# Patient Record
Sex: Female | Born: 1935 | Hispanic: No | Marital: Married | Smoking: Never smoker
Health system: Southern US, Community
[De-identification: ages and names within clinical notes are randomized; demographics above are authoritative.]

## PROBLEM LIST (undated history)

## (undated) DIAGNOSIS — Z7901 Long term (current) use of anticoagulants: Secondary | ICD-10-CM

## (undated) DIAGNOSIS — N186 End stage renal disease: Secondary | ICD-10-CM

## (undated) DIAGNOSIS — I214 Non-ST elevation (NSTEMI) myocardial infarction: Secondary | ICD-10-CM

## (undated) DIAGNOSIS — K8020 Calculus of gallbladder without cholecystitis without obstruction: Secondary | ICD-10-CM

## (undated) DIAGNOSIS — E0590 Thyrotoxicosis, unspecified without thyrotoxic crisis or storm: Secondary | ICD-10-CM

## (undated) DIAGNOSIS — E785 Hyperlipidemia, unspecified: Secondary | ICD-10-CM

## (undated) DIAGNOSIS — D649 Anemia, unspecified: Secondary | ICD-10-CM

## (undated) DIAGNOSIS — I10 Essential (primary) hypertension: Secondary | ICD-10-CM

## (undated) DIAGNOSIS — I480 Paroxysmal atrial fibrillation: Secondary | ICD-10-CM

## (undated) DIAGNOSIS — F419 Anxiety disorder, unspecified: Secondary | ICD-10-CM

## (undated) DIAGNOSIS — I4892 Unspecified atrial flutter: Principal | ICD-10-CM

## (undated) HISTORY — PX: BREAST LUMPECTOMY: SHX2

## (undated) HISTORY — PX: GALLBLADDER SURGERY: SHX652

## (undated) HISTORY — DX: Thyrotoxicosis, unspecified without thyrotoxic crisis or storm: E05.90

## (undated) HISTORY — DX: Non-ST elevation (NSTEMI) myocardial infarction: I21.4

## (undated) HISTORY — DX: End stage renal disease: N18.6

## (undated) HISTORY — DX: Anemia, unspecified: D64.9

## (undated) HISTORY — DX: Calculus of gallbladder without cholecystitis without obstruction: K80.20

## (undated) HISTORY — DX: Hyperlipidemia, unspecified: E78.5

## (undated) HISTORY — DX: Essential (primary) hypertension: I10

## (undated) HISTORY — DX: Paroxysmal atrial fibrillation: I48.0

## (undated) HISTORY — DX: Anxiety disorder, unspecified: F41.9

---

## 1998-05-03 ENCOUNTER — Encounter: Admission: RE | Admit: 1998-05-03 | Discharge: 1998-05-03 | Payer: Self-pay | Admitting: Internal Medicine

## 1998-05-06 ENCOUNTER — Encounter: Admission: RE | Admit: 1998-05-06 | Discharge: 1998-05-06 | Payer: Self-pay | Admitting: Internal Medicine

## 1998-05-27 ENCOUNTER — Ambulatory Visit (HOSPITAL_COMMUNITY): Admission: RE | Admit: 1998-05-27 | Discharge: 1998-05-27 | Payer: Self-pay | Admitting: *Deleted

## 1998-06-08 ENCOUNTER — Encounter: Admission: RE | Admit: 1998-06-08 | Discharge: 1998-06-08 | Payer: Self-pay | Admitting: Obstetrics & Gynecology

## 1999-06-02 ENCOUNTER — Encounter: Payer: Self-pay | Admitting: Internal Medicine

## 1999-06-02 ENCOUNTER — Ambulatory Visit (HOSPITAL_COMMUNITY): Admission: RE | Admit: 1999-06-02 | Discharge: 1999-06-02 | Payer: Self-pay | Admitting: Internal Medicine

## 1999-06-16 ENCOUNTER — Encounter: Admission: RE | Admit: 1999-06-16 | Discharge: 1999-06-16 | Payer: Self-pay | Admitting: Internal Medicine

## 1999-07-26 ENCOUNTER — Encounter: Admission: RE | Admit: 1999-07-26 | Discharge: 1999-07-26 | Payer: Self-pay | Admitting: Internal Medicine

## 1999-07-27 ENCOUNTER — Encounter: Admission: RE | Admit: 1999-07-27 | Discharge: 1999-07-27 | Payer: Self-pay | Admitting: Internal Medicine

## 1999-08-09 ENCOUNTER — Encounter: Admission: RE | Admit: 1999-08-09 | Discharge: 1999-08-09 | Payer: Self-pay | Admitting: Internal Medicine

## 1999-08-17 ENCOUNTER — Encounter: Admission: RE | Admit: 1999-08-17 | Discharge: 1999-08-17 | Payer: Self-pay | Admitting: Internal Medicine

## 1999-10-12 ENCOUNTER — Encounter: Admission: RE | Admit: 1999-10-12 | Discharge: 1999-10-12 | Payer: Self-pay | Admitting: Internal Medicine

## 1999-12-02 ENCOUNTER — Other Ambulatory Visit: Admission: RE | Admit: 1999-12-02 | Discharge: 1999-12-02 | Payer: Self-pay | Admitting: *Deleted

## 1999-12-02 ENCOUNTER — Encounter: Admission: RE | Admit: 1999-12-02 | Discharge: 1999-12-02 | Payer: Self-pay | Admitting: Obstetrics & Gynecology

## 2000-04-09 ENCOUNTER — Encounter: Admission: RE | Admit: 2000-04-09 | Discharge: 2000-04-09 | Payer: Self-pay | Admitting: Internal Medicine

## 2000-04-13 ENCOUNTER — Ambulatory Visit (HOSPITAL_COMMUNITY): Admission: RE | Admit: 2000-04-13 | Discharge: 2000-04-13 | Payer: Self-pay | Admitting: Internal Medicine

## 2000-05-15 ENCOUNTER — Encounter: Admission: RE | Admit: 2000-05-15 | Discharge: 2000-05-15 | Payer: Self-pay | Admitting: Internal Medicine

## 2000-11-20 ENCOUNTER — Encounter: Admission: RE | Admit: 2000-11-20 | Discharge: 2000-11-20 | Payer: Self-pay | Admitting: Obstetrics & Gynecology

## 2000-11-27 ENCOUNTER — Encounter: Admission: RE | Admit: 2000-11-27 | Discharge: 2000-11-27 | Payer: Self-pay | Admitting: Obstetrics & Gynecology

## 2000-12-06 ENCOUNTER — Encounter: Admission: RE | Admit: 2000-12-06 | Discharge: 2000-12-06 | Payer: Self-pay | Admitting: Obstetrics

## 2000-12-06 ENCOUNTER — Encounter: Payer: Self-pay | Admitting: Obstetrics

## 2000-12-24 ENCOUNTER — Ambulatory Visit (HOSPITAL_COMMUNITY): Admission: RE | Admit: 2000-12-24 | Discharge: 2000-12-24 | Payer: Self-pay | Admitting: Obstetrics & Gynecology

## 2001-04-17 ENCOUNTER — Encounter: Admission: RE | Admit: 2001-04-17 | Discharge: 2001-04-17 | Payer: Self-pay | Admitting: Internal Medicine

## 2001-11-01 ENCOUNTER — Encounter: Admission: RE | Admit: 2001-11-01 | Discharge: 2001-11-01 | Payer: Self-pay | Admitting: Internal Medicine

## 2001-11-12 ENCOUNTER — Encounter: Admission: RE | Admit: 2001-11-12 | Discharge: 2001-11-12 | Payer: Self-pay | Admitting: Internal Medicine

## 2001-11-12 ENCOUNTER — Ambulatory Visit (HOSPITAL_COMMUNITY): Admission: RE | Admit: 2001-11-12 | Discharge: 2001-11-12 | Payer: Self-pay | Admitting: Internal Medicine

## 2001-11-12 ENCOUNTER — Emergency Department (HOSPITAL_COMMUNITY): Admission: EM | Admit: 2001-11-12 | Discharge: 2001-11-12 | Payer: Self-pay | Admitting: Emergency Medicine

## 2001-11-12 ENCOUNTER — Encounter: Payer: Self-pay | Admitting: Internal Medicine

## 2002-07-29 ENCOUNTER — Encounter: Admission: RE | Admit: 2002-07-29 | Discharge: 2002-07-29 | Payer: Self-pay | Admitting: Internal Medicine

## 2002-07-31 ENCOUNTER — Ambulatory Visit (HOSPITAL_COMMUNITY): Admission: RE | Admit: 2002-07-31 | Discharge: 2002-07-31 | Payer: Self-pay | Admitting: Obstetrics

## 2002-08-15 ENCOUNTER — Encounter: Admission: RE | Admit: 2002-08-15 | Discharge: 2002-08-15 | Payer: Self-pay | Admitting: Internal Medicine

## 2003-06-17 ENCOUNTER — Encounter: Admission: RE | Admit: 2003-06-17 | Discharge: 2003-06-17 | Payer: Self-pay | Admitting: Internal Medicine

## 2003-06-25 ENCOUNTER — Encounter: Admission: RE | Admit: 2003-06-25 | Discharge: 2003-06-25 | Payer: Self-pay | Admitting: Internal Medicine

## 2003-07-07 ENCOUNTER — Encounter: Admission: RE | Admit: 2003-07-07 | Discharge: 2003-07-07 | Payer: Self-pay | Admitting: Internal Medicine

## 2003-07-07 ENCOUNTER — Ambulatory Visit (HOSPITAL_COMMUNITY): Admission: RE | Admit: 2003-07-07 | Discharge: 2003-07-07 | Payer: Self-pay | Admitting: Internal Medicine

## 2003-07-15 ENCOUNTER — Encounter: Admission: RE | Admit: 2003-07-15 | Discharge: 2003-07-15 | Payer: Self-pay | Admitting: Internal Medicine

## 2003-07-29 ENCOUNTER — Ambulatory Visit (HOSPITAL_COMMUNITY): Admission: RE | Admit: 2003-07-29 | Discharge: 2003-07-29 | Payer: Self-pay | Admitting: Internal Medicine

## 2003-07-29 ENCOUNTER — Encounter: Payer: Self-pay | Admitting: Cardiovascular Disease

## 2003-07-29 ENCOUNTER — Encounter: Payer: Self-pay | Admitting: Internal Medicine

## 2003-07-29 ENCOUNTER — Encounter: Admission: RE | Admit: 2003-07-29 | Discharge: 2003-07-29 | Payer: Self-pay | Admitting: Internal Medicine

## 2003-07-31 ENCOUNTER — Encounter: Admission: RE | Admit: 2003-07-31 | Discharge: 2003-07-31 | Payer: Self-pay | Admitting: Internal Medicine

## 2003-09-17 ENCOUNTER — Encounter: Admission: RE | Admit: 2003-09-17 | Discharge: 2003-09-17 | Payer: Self-pay | Admitting: Internal Medicine

## 2004-02-09 ENCOUNTER — Ambulatory Visit (HOSPITAL_COMMUNITY): Admission: RE | Admit: 2004-02-09 | Discharge: 2004-02-09 | Payer: Self-pay

## 2004-02-16 ENCOUNTER — Encounter (INDEPENDENT_AMBULATORY_CARE_PROVIDER_SITE_OTHER): Payer: Self-pay | Admitting: *Deleted

## 2004-02-16 ENCOUNTER — Ambulatory Visit: Payer: Self-pay | Admitting: *Deleted

## 2004-06-15 ENCOUNTER — Ambulatory Visit: Payer: Self-pay | Admitting: Internal Medicine

## 2004-06-20 ENCOUNTER — Ambulatory Visit: Payer: Self-pay | Admitting: Internal Medicine

## 2004-07-11 ENCOUNTER — Ambulatory Visit: Payer: Self-pay | Admitting: Internal Medicine

## 2004-08-24 ENCOUNTER — Ambulatory Visit: Payer: Self-pay | Admitting: Internal Medicine

## 2004-08-29 ENCOUNTER — Ambulatory Visit: Payer: Self-pay | Admitting: Internal Medicine

## 2004-09-12 ENCOUNTER — Ambulatory Visit: Payer: Self-pay | Admitting: Internal Medicine

## 2005-06-05 ENCOUNTER — Ambulatory Visit: Payer: Self-pay | Admitting: Internal Medicine

## 2005-06-15 ENCOUNTER — Ambulatory Visit: Payer: Self-pay | Admitting: Hospitalist

## 2005-06-27 ENCOUNTER — Ambulatory Visit: Payer: Self-pay | Admitting: Internal Medicine

## 2005-07-24 ENCOUNTER — Ambulatory Visit: Payer: Self-pay | Admitting: Internal Medicine

## 2005-08-15 ENCOUNTER — Ambulatory Visit: Payer: Self-pay | Admitting: Internal Medicine

## 2005-08-15 ENCOUNTER — Ambulatory Visit (HOSPITAL_COMMUNITY): Admission: RE | Admit: 2005-08-15 | Discharge: 2005-08-15 | Payer: Self-pay | Admitting: Internal Medicine

## 2005-09-11 ENCOUNTER — Ambulatory Visit: Payer: Self-pay | Admitting: Internal Medicine

## 2005-09-25 ENCOUNTER — Ambulatory Visit: Payer: Self-pay | Admitting: Internal Medicine

## 2005-10-17 ENCOUNTER — Ambulatory Visit: Payer: Self-pay | Admitting: Internal Medicine

## 2005-11-08 ENCOUNTER — Ambulatory Visit: Payer: Self-pay | Admitting: Internal Medicine

## 2005-12-26 ENCOUNTER — Ambulatory Visit: Payer: Self-pay | Admitting: Internal Medicine

## 2006-02-14 ENCOUNTER — Ambulatory Visit: Payer: Self-pay | Admitting: Internal Medicine

## 2006-04-18 DIAGNOSIS — Z888 Allergy status to other drugs, medicaments and biological substances status: Secondary | ICD-10-CM | POA: Insufficient documentation

## 2006-04-18 DIAGNOSIS — K8020 Calculus of gallbladder without cholecystitis without obstruction: Secondary | ICD-10-CM | POA: Insufficient documentation

## 2006-04-18 DIAGNOSIS — E785 Hyperlipidemia, unspecified: Secondary | ICD-10-CM

## 2006-04-18 DIAGNOSIS — F411 Generalized anxiety disorder: Secondary | ICD-10-CM | POA: Insufficient documentation

## 2006-04-18 DIAGNOSIS — D638 Anemia in other chronic diseases classified elsewhere: Secondary | ICD-10-CM | POA: Insufficient documentation

## 2006-05-07 ENCOUNTER — Ambulatory Visit: Payer: Self-pay | Admitting: Internal Medicine

## 2006-06-04 ENCOUNTER — Ambulatory Visit: Payer: Self-pay | Admitting: Internal Medicine

## 2006-06-04 DIAGNOSIS — R0789 Other chest pain: Secondary | ICD-10-CM

## 2006-06-05 ENCOUNTER — Ambulatory Visit: Payer: Self-pay | Admitting: Cardiovascular Disease

## 2006-06-21 ENCOUNTER — Ambulatory Visit: Payer: Self-pay

## 2006-06-26 ENCOUNTER — Ambulatory Visit: Payer: Self-pay | Admitting: Cardiovascular Disease

## 2006-06-26 ENCOUNTER — Encounter: Payer: Self-pay | Admitting: Internal Medicine

## 2006-07-30 ENCOUNTER — Ambulatory Visit: Payer: Self-pay | Admitting: Cardiovascular Disease

## 2006-08-13 ENCOUNTER — Ambulatory Visit: Payer: Self-pay | Admitting: Cardiovascular Disease

## 2006-11-15 ENCOUNTER — Ambulatory Visit: Payer: Self-pay | Admitting: Cardiovascular Disease

## 2006-11-15 ENCOUNTER — Encounter: Payer: Self-pay | Admitting: Internal Medicine

## 2007-01-16 ENCOUNTER — Telehealth: Payer: Self-pay | Admitting: Internal Medicine

## 2007-02-04 ENCOUNTER — Ambulatory Visit: Payer: Self-pay | Admitting: Cardiovascular Disease

## 2007-05-09 ENCOUNTER — Ambulatory Visit: Payer: Self-pay | Admitting: Cardiovascular Disease

## 2007-06-05 ENCOUNTER — Emergency Department (HOSPITAL_COMMUNITY): Admission: EM | Admit: 2007-06-05 | Discharge: 2007-06-05 | Payer: Self-pay | Admitting: Emergency Medicine

## 2007-06-20 ENCOUNTER — Encounter: Payer: Self-pay | Admitting: Internal Medicine

## 2007-06-20 ENCOUNTER — Ambulatory Visit (HOSPITAL_COMMUNITY): Admission: RE | Admit: 2007-06-20 | Discharge: 2007-06-20 | Payer: Self-pay | Admitting: Physical Therapy

## 2007-07-09 ENCOUNTER — Ambulatory Visit: Payer: Self-pay | Admitting: Internal Medicine

## 2007-07-16 ENCOUNTER — Ambulatory Visit: Payer: Self-pay | Admitting: Internal Medicine

## 2007-07-17 LAB — CONVERTED CEMR LAB
ALT: 11 units/L (ref 0–35)
AST: 17 units/L (ref 0–37)
Albumin: 3.8 g/dL (ref 3.5–5.2)
Alkaline Phosphatase: 75 units/L (ref 39–117)
BUN: 24 mg/dL — ABNORMAL HIGH (ref 6–23)
CO2: 27 meq/L (ref 19–32)
Calcium: 9.2 mg/dL (ref 8.4–10.5)
Chloride: 106 meq/L (ref 96–112)
Cholesterol: 204 mg/dL — ABNORMAL HIGH (ref 0–200)
Creatinine, Ser: 1.81 mg/dL — ABNORMAL HIGH (ref 0.40–1.20)
Glucose, Bld: 101 mg/dL — ABNORMAL HIGH (ref 70–99)
HDL: 41 mg/dL (ref >39)
LDL Cholesterol: 119 mg/dL — ABNORMAL HIGH (ref 0–99)
Potassium: 4.5 meq/L (ref 3.5–5.3)
Sodium: 143 meq/L (ref 135–145)
Total Bilirubin: 0.3 mg/dL (ref 0.3–1.2)
Total CHOL/HDL Ratio: 5
Total Protein: 6.7 g/dL (ref 6.0–8.3)
Triglycerides: 219 mg/dL — ABNORMAL HIGH (ref <150)
VLDL: 44 mg/dL — ABNORMAL HIGH (ref 0–40)

## 2007-09-19 ENCOUNTER — Ambulatory Visit: Payer: Self-pay | Admitting: Internal Medicine

## 2007-10-29 ENCOUNTER — Ambulatory Visit: Payer: Self-pay | Admitting: Internal Medicine

## 2007-12-06 ENCOUNTER — Ambulatory Visit: Payer: Self-pay | Admitting: Internal Medicine

## 2007-12-09 ENCOUNTER — Telehealth: Payer: Self-pay | Admitting: Internal Medicine

## 2007-12-09 LAB — CONVERTED CEMR LAB
BUN: 30 mg/dL — ABNORMAL HIGH (ref 6–23)
CO2: 25 meq/L (ref 19–32)
Calcium: 9.7 mg/dL (ref 8.4–10.5)
Chloride: 104 meq/L (ref 96–112)
Creatinine, Ser: 2.01 mg/dL — ABNORMAL HIGH (ref 0.40–1.20)
Creatinine, Urine: 29.9 mg/dL
Glucose, Bld: 99 mg/dL (ref 70–99)
Microalb Creat Ratio: 611.6 mg/g — ABNORMAL HIGH (ref 0.0–30.0)
Microalb, Ur: 18.3 mg/dL — ABNORMAL HIGH (ref 0.00–1.89)
Potassium: 4.5 meq/L (ref 3.5–5.3)
Sodium: 141 meq/L (ref 135–145)
TSH: 2.674 microintl units/mL (ref 0.350–4.50)

## 2008-02-04 ENCOUNTER — Telehealth: Payer: Self-pay | Admitting: Internal Medicine

## 2008-02-10 ENCOUNTER — Ambulatory Visit: Payer: Self-pay

## 2008-02-10 ENCOUNTER — Ambulatory Visit: Payer: Self-pay | Admitting: Internal Medicine

## 2008-02-22 DIAGNOSIS — I4891 Unspecified atrial fibrillation: Secondary | ICD-10-CM | POA: Insufficient documentation

## 2008-02-27 ENCOUNTER — Ambulatory Visit: Payer: Self-pay | Admitting: Cardiovascular Disease

## 2008-03-03 ENCOUNTER — Encounter: Payer: Self-pay | Admitting: Internal Medicine

## 2008-03-17 DIAGNOSIS — I10 Essential (primary) hypertension: Secondary | ICD-10-CM

## 2008-07-03 ENCOUNTER — Ambulatory Visit: Payer: Self-pay | Admitting: Internal Medicine

## 2008-07-03 ENCOUNTER — Telehealth: Payer: Self-pay | Admitting: Internal Medicine

## 2008-07-06 ENCOUNTER — Encounter: Payer: Self-pay | Admitting: Internal Medicine

## 2008-07-07 LAB — CONVERTED CEMR LAB
BUN: 23 mg/dL (ref 6–23)
CO2: 25 meq/L (ref 19–32)
Calcium: 9.4 mg/dL (ref 8.4–10.5)
Chloride: 104 meq/L (ref 96–112)
Creatinine, Ser: 1.94 mg/dL — ABNORMAL HIGH (ref 0.40–1.20)
GFR calc Af Amer: 31 mL/min — ABNORMAL LOW (ref >60)
GFR calc non Af Amer: 25 mL/min — ABNORMAL LOW (ref >60)
Glucose, Bld: 111 mg/dL — ABNORMAL HIGH (ref 70–99)
HCT: 33.7 % — ABNORMAL LOW (ref 36.0–46.0)
Hemoglobin: 10.8 g/dL — ABNORMAL LOW (ref 12.0–15.0)
MCHC: 32 g/dL (ref 30.0–36.0)
MCV: 89.4 fL (ref 78.0–100.0)
Platelets: 244 10*3/uL (ref 150–400)
Potassium: 4.5 meq/L (ref 3.5–5.3)
RBC: 3.77 M/uL — ABNORMAL LOW (ref 3.87–5.11)
RDW: 14.7 % (ref 11.5–15.5)
Sodium: 142 meq/L (ref 135–145)
WBC: 6.2 10*3/uL (ref 4.0–10.5)

## 2008-07-10 ENCOUNTER — Telehealth: Payer: Self-pay | Admitting: Internal Medicine

## 2009-02-06 ENCOUNTER — Encounter: Payer: Self-pay | Admitting: Internal Medicine

## 2009-02-08 ENCOUNTER — Encounter: Admission: RE | Admit: 2009-02-08 | Discharge: 2009-02-08 | Payer: Self-pay | Admitting: Obstetrics and Gynecology

## 2009-02-10 HISTORY — PX: OTHER SURGICAL HISTORY: SHX169

## 2009-02-12 ENCOUNTER — Ambulatory Visit: Payer: Self-pay | Admitting: Internal Medicine

## 2009-02-17 ENCOUNTER — Encounter: Payer: Self-pay | Admitting: Internal Medicine

## 2009-02-19 ENCOUNTER — Telehealth (INDEPENDENT_AMBULATORY_CARE_PROVIDER_SITE_OTHER): Payer: Self-pay | Admitting: *Deleted

## 2009-02-26 ENCOUNTER — Ambulatory Visit: Payer: Self-pay | Admitting: Cardiovascular Disease

## 2009-03-01 ENCOUNTER — Telehealth (INDEPENDENT_AMBULATORY_CARE_PROVIDER_SITE_OTHER): Payer: Self-pay | Admitting: *Deleted

## 2009-03-04 ENCOUNTER — Observation Stay (HOSPITAL_COMMUNITY): Admission: RE | Admit: 2009-03-04 | Discharge: 2009-03-05 | Payer: Self-pay | Admitting: General Surgery

## 2009-03-08 ENCOUNTER — Encounter: Payer: Self-pay | Admitting: Internal Medicine

## 2009-03-22 ENCOUNTER — Encounter: Payer: Self-pay | Admitting: Internal Medicine

## 2009-04-05 ENCOUNTER — Ambulatory Visit (HOSPITAL_COMMUNITY): Admission: RE | Admit: 2009-04-05 | Discharge: 2009-04-05 | Payer: Self-pay | Admitting: General Surgery

## 2009-05-12 ENCOUNTER — Encounter: Payer: Self-pay | Admitting: Internal Medicine

## 2009-05-25 ENCOUNTER — Encounter: Payer: Self-pay | Admitting: Internal Medicine

## 2009-06-14 ENCOUNTER — Encounter: Payer: Self-pay | Admitting: Internal Medicine

## 2009-06-18 ENCOUNTER — Ambulatory Visit: Payer: Self-pay | Admitting: Internal Medicine

## 2009-06-21 LAB — CONVERTED CEMR LAB
BUN: 34 mg/dL — ABNORMAL HIGH (ref 6–23)
CO2: 25 meq/L (ref 19–32)
Calcium: 9.2 mg/dL (ref 8.4–10.5)
Chloride: 103 meq/L (ref 96–112)
Cholesterol: 175 mg/dL (ref 0–200)
Creatinine, Ser: 2.65 mg/dL — ABNORMAL HIGH (ref 0.40–1.20)
Glucose, Bld: 97 mg/dL (ref 70–99)
HDL: 51 mg/dL (ref >39)
LDL Cholesterol: 102 mg/dL — ABNORMAL HIGH (ref 0–99)
Potassium: 4.3 meq/L (ref 3.5–5.3)
Sodium: 140 meq/L (ref 135–145)
TSH: 3.022 microintl units/mL (ref 0.350–4.5)
Total CHOL/HDL Ratio: 3.4
Triglycerides: 108 mg/dL (ref <150)
VLDL: 22 mg/dL (ref 0–40)
Vit D, 25-Hydroxy: <10 ng/mL — ABNORMAL LOW (ref 30–89)

## 2009-06-24 ENCOUNTER — Ambulatory Visit: Payer: Self-pay | Admitting: Internal Medicine

## 2009-06-24 DIAGNOSIS — E559 Vitamin D deficiency, unspecified: Secondary | ICD-10-CM | POA: Insufficient documentation

## 2009-06-28 LAB — CONVERTED CEMR LAB
Bilirubin Urine: NEGATIVE
Casts: NONE SEEN /lpf
Creatinine, Urine: 56.3 mg/dL
Crystals: NONE SEEN
Ketones, ur: NEGATIVE mg/dL
Leukocytes, UA: NEGATIVE
Microalb Creat Ratio: 3024.5 mg/g — ABNORMAL HIGH (ref 0.0–30.0)
Microalb, Ur: 170.28 mg/dL — ABNORMAL HIGH (ref 0.00–1.89)
Nitrite: NEGATIVE
Protein, ur: >300 mg/dL — AB
RBC / HPF: NONE SEEN (ref <3)
Specific Gravity, Urine: 1.014 (ref 1.005–1.0)
Urine Glucose: NEGATIVE mg/dL
Urobilinogen, UA: 0.2 (ref 0.0–1.0)
pH: 7 (ref 5.0–8.0)

## 2009-06-30 ENCOUNTER — Ambulatory Visit: Payer: Self-pay | Admitting: Internal Medicine

## 2009-06-30 LAB — CONVERTED CEMR LAB
Albumin ELP: 54 % — ABNORMAL LOW (ref 55.8–66.1)
Albumin, U: DETECTED %
Alpha 1, Urine: DETECTED % — AB
Alpha 2, Urine: DETECTED % — AB
Alpha-1-Globulin: 5 % — ABNORMAL HIGH (ref 2.9–4.9)
Alpha-2-Globulin: 13 % — ABNORMAL HIGH (ref 7.1–11.8)
Basophils Absolute: 0 10*3/uL (ref 0.0–0.1)
Basophils Relative: 1 % (ref 0–1)
Beta Globulin: 5.4 % (ref 4.7–7.2)
Beta, Urine: DETECTED % — AB
Eosinophils Absolute: 0.2 10*3/uL (ref 0.0–0.7)
Eosinophils Relative: 4 % (ref 0–5)
Free Kappa Lt Chains,Ur: 88.9 mg/dL — ABNORMAL HIGH (ref 0.04–1.51)
Free Lambda Lt Chains,Ur: 7.14 mg/dL — ABNORMAL HIGH (ref 0.08–1.01)
Gamma Globulin, Urine: DETECTED % — AB
Gamma Globulin: 18.1 % (ref 11.1–18.8)
HCT: 27.3 % — ABNORMAL LOW (ref 36.0–46.0)
Hemoglobin: 8.6 g/dL — ABNORMAL LOW (ref 12.0–15.0)
Lymphocytes Relative: 17 % (ref 12–46)
Lymphs Abs: 1 10*3/uL (ref 0.7–4.0)
MCHC: 31.5 g/dL (ref 30.0–36.0)
MCV: 85.8 fL (ref >=78.0)
Monocytes Absolute: 0.3 10*3/uL (ref 0.1–1.0)
Monocytes Relative: 5 % (ref 3–12)
Neutro Abs: 4.2 10*3/uL (ref 1.7–7.7)
Neutrophils Relative %: 74 % (ref 43–77)
Platelets: 223 10*3/uL (ref 150–400)
RBC: 3.18 M/uL — ABNORMAL LOW (ref 3.87–5.11)
RDW: 15.4 % (ref 11.5–15.5)
Total Protein, Serum Electrophoresis: 6.6 g/dL (ref 6.0–8.3)
Total Protein, Urine: 433.8 mg/dL
WBC: 5.7 10*3/uL (ref 4.0–10.5)

## 2009-07-01 ENCOUNTER — Encounter: Payer: Self-pay | Admitting: Internal Medicine

## 2009-08-17 ENCOUNTER — Ambulatory Visit: Payer: Self-pay | Admitting: Internal Medicine

## 2009-08-19 LAB — CONVERTED CEMR LAB
IgA: 172 mg/dL (ref 68–378)
IgG (Immunoglobin G), Serum: 1100 mg/dL (ref 694–1618)
IgM, Serum: 376 mg/dL — ABNORMAL HIGH (ref 60–263)
Total Protein, Serum Electrophoresis: 6.6 g/dL (ref 6.0–8.3)

## 2009-09-03 ENCOUNTER — Inpatient Hospital Stay (HOSPITAL_COMMUNITY): Admission: EM | Admit: 2009-09-03 | Discharge: 2009-09-21 | Payer: Self-pay | Admitting: Emergency Medicine

## 2009-09-03 ENCOUNTER — Ambulatory Visit: Payer: Self-pay | Admitting: Internal Medicine

## 2009-09-07 ENCOUNTER — Encounter: Payer: Self-pay | Admitting: Cardiovascular Disease

## 2009-09-09 ENCOUNTER — Encounter: Payer: Self-pay | Admitting: Cardiovascular Disease

## 2009-09-12 ENCOUNTER — Encounter: Payer: Self-pay | Admitting: Cardiovascular Disease

## 2009-09-17 ENCOUNTER — Ambulatory Visit: Payer: Self-pay | Admitting: Vascular Surgery

## 2009-09-23 ENCOUNTER — Encounter: Payer: Self-pay | Admitting: Internal Medicine

## 2009-10-04 ENCOUNTER — Telehealth: Payer: Self-pay | Admitting: Cardiovascular Disease

## 2009-10-07 ENCOUNTER — Telehealth: Payer: Self-pay | Admitting: *Deleted

## 2009-10-11 ENCOUNTER — Ambulatory Visit: Payer: Self-pay | Admitting: Cardiovascular Disease

## 2009-10-11 DIAGNOSIS — I5032 Chronic diastolic (congestive) heart failure: Secondary | ICD-10-CM

## 2009-10-12 ENCOUNTER — Telehealth: Payer: Self-pay | Admitting: Cardiovascular Disease

## 2009-10-13 ENCOUNTER — Encounter: Payer: Self-pay | Admitting: Cardiovascular Disease

## 2009-10-21 ENCOUNTER — Encounter: Payer: Self-pay | Admitting: Internal Medicine

## 2009-10-21 ENCOUNTER — Ambulatory Visit: Payer: Self-pay | Admitting: Cardiovascular Disease

## 2009-11-02 ENCOUNTER — Ambulatory Visit: Payer: Self-pay | Admitting: Cardiovascular Disease

## 2009-11-03 ENCOUNTER — Telehealth: Payer: Self-pay | Admitting: Cardiovascular Disease

## 2009-11-03 ENCOUNTER — Encounter: Payer: Self-pay | Admitting: Internal Medicine

## 2009-11-03 ENCOUNTER — Ambulatory Visit: Payer: Self-pay | Admitting: Cardiovascular Disease

## 2009-11-04 LAB — CONVERTED CEMR LAB
BUN: 71 mg/dL — ABNORMAL HIGH (ref 6–23)
CO2: 32 meq/L (ref 19–32)
Calcium: 9 mg/dL (ref 8.4–10.5)
Chloride: 93 meq/L — ABNORMAL LOW (ref 96–112)
Creatinine, Ser: 4.6 mg/dL (ref 0.4–1.2)
GFR calc non Af Amer: 10.07 mL/min (ref >60)
Glucose, Bld: 135 mg/dL — ABNORMAL HIGH (ref 70–99)
Potassium: 3.5 meq/L (ref 3.5–5.1)
Pro B Natriuretic peptide (BNP): 1203.2 pg/mL — ABNORMAL HIGH (ref 0.0–100.0)
Sodium: 137 meq/L (ref 135–145)

## 2009-11-09 ENCOUNTER — Ambulatory Visit: Payer: Self-pay | Admitting: Vascular Surgery

## 2009-11-19 ENCOUNTER — Encounter: Payer: Self-pay | Admitting: Cardiovascular Disease

## 2009-11-22 ENCOUNTER — Encounter: Payer: Self-pay | Admitting: Internal Medicine

## 2009-11-22 ENCOUNTER — Encounter: Payer: Self-pay | Admitting: Cardiovascular Disease

## 2009-11-29 ENCOUNTER — Ambulatory Visit: Payer: Self-pay | Admitting: Cardiovascular Disease

## 2009-12-16 ENCOUNTER — Telehealth: Payer: Self-pay | Admitting: Internal Medicine

## 2009-12-17 ENCOUNTER — Ambulatory Visit: Payer: Self-pay | Admitting: Cardiovascular Disease

## 2009-12-27 ENCOUNTER — Encounter: Payer: Self-pay | Admitting: Cardiovascular Disease

## 2009-12-27 LAB — CONVERTED CEMR LAB
Calcium: 9.5 mg/dL (ref 8.4–10.5)
Creatinine, Ser: 5.81 mg/dL — ABNORMAL HIGH (ref 0.40–1.20)
Glucose, Bld: 137 mg/dL — ABNORMAL HIGH (ref 70–99)
Sodium: 142 meq/L (ref 135–145)

## 2010-01-14 ENCOUNTER — Telehealth (INDEPENDENT_AMBULATORY_CARE_PROVIDER_SITE_OTHER): Payer: Self-pay | Admitting: *Deleted

## 2010-01-19 ENCOUNTER — Encounter: Payer: Self-pay | Admitting: Cardiovascular Disease

## 2010-01-24 ENCOUNTER — Ambulatory Visit: Payer: Self-pay | Admitting: Cardiovascular Disease

## 2010-01-24 LAB — CONVERTED CEMR LAB
BUN: 100 mg/dL (ref 6–23)
CO2: 32 meq/L (ref 19–32)
Calcium: 8.8 mg/dL (ref 8.4–10.5)
Chloride: 95 meq/L — ABNORMAL LOW (ref 96–112)
Creatinine, Ser: 6.3 mg/dL (ref 0.4–1.2)
Glucose, Bld: 95 mg/dL (ref 70–99)
Hemoglobin: 7.9 g/dL — CL (ref 12.0–15.0)
Saturation Ratios: 12.3 % — ABNORMAL LOW (ref 20.0–50.0)

## 2010-01-26 ENCOUNTER — Encounter: Payer: Self-pay | Admitting: Cardiovascular Disease

## 2010-01-26 ENCOUNTER — Encounter: Payer: Self-pay | Admitting: Internal Medicine

## 2010-01-31 ENCOUNTER — Ambulatory Visit: Payer: Self-pay | Admitting: Cardiovascular Disease

## 2010-02-09 ENCOUNTER — Encounter (HOSPITAL_COMMUNITY)
Admission: RE | Admit: 2010-02-09 | Discharge: 2010-04-12 | Payer: Self-pay | Source: Home / Self Care | Attending: Nephrology | Admitting: Nephrology

## 2010-02-15 ENCOUNTER — Telehealth: Payer: Self-pay | Admitting: Cardiovascular Disease

## 2010-02-28 ENCOUNTER — Encounter: Payer: Self-pay | Admitting: Cardiovascular Disease

## 2010-03-02 ENCOUNTER — Telehealth (INDEPENDENT_AMBULATORY_CARE_PROVIDER_SITE_OTHER): Payer: Self-pay | Admitting: *Deleted

## 2010-03-04 ENCOUNTER — Telehealth: Payer: Self-pay | Admitting: Cardiovascular Disease

## 2010-03-08 ENCOUNTER — Telehealth: Payer: Self-pay | Admitting: Cardiovascular Disease

## 2010-03-09 ENCOUNTER — Telehealth: Payer: Self-pay | Admitting: Cardiovascular Disease

## 2010-03-18 ENCOUNTER — Telehealth: Payer: Self-pay | Admitting: Cardiovascular Disease

## 2010-03-24 ENCOUNTER — Ambulatory Visit
Admission: RE | Admit: 2010-03-24 | Discharge: 2010-03-24 | Payer: Self-pay | Source: Home / Self Care | Attending: Cardiovascular Disease | Admitting: Cardiovascular Disease

## 2010-03-24 ENCOUNTER — Encounter: Payer: Self-pay | Admitting: Cardiovascular Disease

## 2010-03-28 LAB — RENAL FUNCTION PANEL
Albumin: 2.9 g/dL — ABNORMAL LOW (ref 3.5–5.2)
BUN: 76 mg/dL — ABNORMAL HIGH (ref 6–23)
CO2: 28 mEq/L (ref 19–32)
Calcium: 8.6 mg/dL (ref 8.4–10.5)
Chloride: 100 mEq/L (ref 96–112)
Creatinine, Ser: 5.88 mg/dL — ABNORMAL HIGH (ref 0.4–1.2)
GFR calc Af Amer: 9 mL/min — ABNORMAL LOW (ref >60)
GFR calc non Af Amer: 7 mL/min — ABNORMAL LOW (ref >60)
Glucose, Bld: 134 mg/dL — ABNORMAL HIGH (ref 70–99)
Phosphorus: 6.1 mg/dL — ABNORMAL HIGH (ref 2.3–4.6)
Potassium: 3.3 mEq/L — ABNORMAL LOW (ref 3.5–5.1)
Sodium: 141 mEq/L (ref 135–145)

## 2010-03-28 LAB — HEPATITIS B SURFACE ANTIGEN: Hepatitis B Surface Ag: NEGATIVE

## 2010-03-28 LAB — POCT HEMOGLOBIN-HEMACUE: Hemoglobin: 9.9 g/dL — ABNORMAL LOW (ref 12.0–15.0)

## 2010-03-29 ENCOUNTER — Telehealth: Payer: Self-pay | Admitting: Cardiovascular Disease

## 2010-04-01 ENCOUNTER — Encounter: Payer: Self-pay | Admitting: Internal Medicine

## 2010-04-03 ENCOUNTER — Encounter: Payer: Self-pay | Admitting: Physical Therapy

## 2010-04-10 LAB — CONVERTED CEMR LAB
BUN: 41 mg/dL — ABNORMAL HIGH (ref 6–23)
CO2: 27 meq/L (ref 19–32)
Calcium: 9 mg/dL (ref 8.4–10.5)
Chloride: 110 meq/L (ref 96–112)
Creatinine, Ser: 4 mg/dL — ABNORMAL HIGH (ref 0.4–1.2)
GFR calc non Af Amer: 11.68 mL/min (ref >60)
Glucose, Bld: 124 mg/dL — ABNORMAL HIGH (ref 70–99)
Potassium: 4.3 meq/L (ref 3.5–5.1)
Pro B Natriuretic peptide (BNP): 1161.2 pg/mL — ABNORMAL HIGH (ref 0.0–100.0)
Sodium: 143 meq/L (ref 135–145)

## 2010-04-13 ENCOUNTER — Encounter (HOSPITAL_COMMUNITY): Payer: Self-pay

## 2010-04-14 ENCOUNTER — Ambulatory Visit
Admission: RE | Admit: 2010-04-14 | Discharge: 2010-04-14 | Disposition: A | Discharge status: Home / Self Care | Payer: Self-pay | Source: Ambulatory Visit | Attending: Nephrology | Admitting: Nephrology

## 2010-04-14 ENCOUNTER — Encounter (HOSPITAL_COMMUNITY): Payer: Self-pay | Admitting: Radiology

## 2010-04-14 ENCOUNTER — Emergency Department (HOSPITAL_COMMUNITY)
Admission: EM | Admit: 2010-04-14 | Discharge: 2010-04-14 | Disposition: A | Discharge status: Home / Self Care | Payer: Medicaid Other | Attending: Emergency Medicine | Admitting: Emergency Medicine

## 2010-04-14 ENCOUNTER — Emergency Department (HOSPITAL_COMMUNITY): Payer: Medicaid Other

## 2010-04-14 ENCOUNTER — Other Ambulatory Visit: Payer: Self-pay | Admitting: Nephrology

## 2010-04-14 DIAGNOSIS — Z79899 Other long term (current) drug therapy: Secondary | ICD-10-CM | POA: Insufficient documentation

## 2010-04-14 DIAGNOSIS — R071 Chest pain on breathing: Secondary | ICD-10-CM | POA: Insufficient documentation

## 2010-04-14 DIAGNOSIS — R7309 Other abnormal glucose: Secondary | ICD-10-CM | POA: Insufficient documentation

## 2010-04-14 DIAGNOSIS — M7989 Other specified soft tissue disorders: Secondary | ICD-10-CM | POA: Insufficient documentation

## 2010-04-14 DIAGNOSIS — N189 Chronic kidney disease, unspecified: Secondary | ICD-10-CM | POA: Insufficient documentation

## 2010-04-14 DIAGNOSIS — R109 Unspecified abdominal pain: Secondary | ICD-10-CM | POA: Insufficient documentation

## 2010-04-14 DIAGNOSIS — I129 Hypertensive chronic kidney disease with stage 1 through stage 4 chronic kidney disease, or unspecified chronic kidney disease: Secondary | ICD-10-CM | POA: Insufficient documentation

## 2010-04-14 DIAGNOSIS — K219 Gastro-esophageal reflux disease without esophagitis: Secondary | ICD-10-CM | POA: Insufficient documentation

## 2010-04-14 DIAGNOSIS — R609 Edema, unspecified: Secondary | ICD-10-CM | POA: Insufficient documentation

## 2010-04-14 LAB — APTT: aPTT: 26 seconds (ref 24–37)

## 2010-04-14 LAB — DIFFERENTIAL
Basophils Absolute: 0.1 10*3/uL (ref 0.0–0.1)
Basophils Relative: 1 % (ref 0–1)
Eosinophils Absolute: 0.7 10*3/uL (ref 0.0–0.7)
Eosinophils Relative: 8 % — ABNORMAL HIGH (ref 0–5)
Lymphs Abs: 0.9 10*3/uL (ref 0.7–4.0)
Neutrophils Relative %: 73 % (ref 43–77)

## 2010-04-14 LAB — CBC
Platelets: 211 10*3/uL (ref 150–400)
RBC: 3.88 MIL/uL (ref 3.87–5.11)
RDW: 18.1 % — ABNORMAL HIGH (ref 11.5–15.5)
WBC: 8.3 10*3/uL (ref 4.0–10.5)

## 2010-04-14 LAB — URINE MICROSCOPIC-ADD ON

## 2010-04-14 LAB — BASIC METABOLIC PANEL
BUN: 13 mg/dL (ref 6–23)
Calcium: 9 mg/dL (ref 8.4–10.5)
Chloride: 97 mEq/L (ref 96–112)
Creatinine, Ser: 2.69 mg/dL — ABNORMAL HIGH (ref 0.4–1.2)
GFR calc Af Amer: 21 mL/min — ABNORMAL LOW (ref >60)

## 2010-04-14 LAB — URINALYSIS, ROUTINE W REFLEX MICROSCOPIC
Hgb urine dipstick: NEGATIVE
Ketones, ur: 15 mg/dL — AB
Protein, ur: >300 mg/dL — AB
Urine Glucose, Fasting: 100 mg/dL — AB
pH: 8 (ref 5.0–8.0)

## 2010-04-14 LAB — PROTIME-INR: Prothrombin Time: 13.4 seconds (ref 11.6–15.2)

## 2010-04-14 LAB — CK TOTAL AND CKMB (NOT AT ARMC)
Relative Index: INVALID (ref 0.0–2.5)
Total CK: 29 U/L (ref 7–177)

## 2010-04-14 MED ORDER — IOHEXOL 300 MG/ML  SOLN: 80.0000 mL | Freq: Once | INTRAMUSCULAR | Status: DC | PRN

## 2010-04-14 NOTE — Assessment & Plan Note (Signed)
Summary: f/u per Alston Berrie//kg   Vital Signs:  Patient profile:   75 year old female Height:      63.5 inches Weight:      130.1 pounds BMI:     22.77 Temp:     99.1 degrees F oral Pulse rate:   61 / minute BP sitting:   182 / 64  (right arm)  Vitals Entered By: Filomena Jungling NT II (June 30, 2009 3:34 PM) CC: follow-up visit Nutritional Status BMI of 19 -24 = normal  Have you ever been in a relationship where you felt threatened, hurt or afraid?No   Does patient need assistance? Functional Status Self care Ambulation Normal   Primary Care Provider:  Ulyess Mort MD  CC:  follow-up visit.  History of Present Illness: 32 woman with uncontrolled HTN due to intolerance to all meds.  Presently she has been unable to take a combination of amlodipine and olmisartan -- severe nausea with every dose. Creatinine has climbed to 2.65 and urine JXB:JYNWG ratio is 3000 mg!  UA is otherwise negative (>300 protein and no cells.).  Allergies: 1)  ! Codeine 2)  * Hypertensive Medications   Impression & Recommendations:  Problem # 1:  HYPERTENSION, KIDNEY DISEASE, UNCONTROLLED (ICD-403.00) Intolerant of azor.  I am reduced to trying anything they suggest.  Any anti-HTN regimen would be better than nothing. They asked to try something w/o a diuretic (altho she has tolerated torsemide).   Will try losartan 50 once daily. Will get UPEP and SPEP, and CBC to exclude myeloma. Told patient and husband that a renal referral was the next step, especially if she does not tolerate the losartan. Although nephrotic range proteinuria is uncommon in hypertensive nephrosclerosis, it can occur and there is little to suggest a separate nephropathy.   The following medications were removed from the medication list:    Torsemide 10 Mg Tabs (Torsemide) .Marland Kitchen... Take 1 tablet by mouth once daily    Azor 5-20 Mg Tabs (Amlodipine-olmesartan) .Marland Kitchen... Take 1 tablet by mouth once a day Her updated medication list for  this problem includes:    Losartan Potassium 50 Mg Tabs (Losartan potassium) .Marland Kitchen... Take 1 tablet by mouth once a day  Orders: T-CBC w/Diff ([id number]) T-Prot. Elect. (Serum) [phone]) T- * Misc. Laboratory test [phone])  Complete Medication List: 1)  D3-50 50000 Unit Caps (Cholecalciferol) .... Take 1 capsule once a week for 4 weeks, then take 2000 units every day. 2)  Losartan Potassium 50 Mg Tabs (Losartan potassium) .... Take 1 tablet by mouth once a day  Patient Instructions: 1)  Please schedule a follow-up appointment in 1 month. Prescriptions: LOSARTAN POTASSIUM 50 MG TABS (LOSARTAN POTASSIUM) Take 1 tablet by mouth once a day  #30 x 11   Entered and Authorized by:   Ulyess Mort MD   Signed by:   Ulyess Mort MD on 06/30/2009   Method used:   Electronically to        [facility] [phone]* (retail)       [address] [place], [place]  [address]       Ph: [phone]       Fax: [phone]   RxID:   [id number]  Process Orders Check Orders Results:     [facility]: ABN not required for this insurance Tests Sent for requisitioning (June 30, 2009 4:25 PM):     06/30/2009: [facility] -- [employer] w/Diff [[id number]] (signed)     06/30/2009: [facility] -- T-Prot. Elect. (Serum) [[id number]] (signed)     06/30/2009: [facility] -- T- * Misc. Laboratory test [phone] (signed)

## 2010-04-14 NOTE — Miscellaneous (Signed)
  Clinical Lists Changes  Medications: Removed medication of D3-50 50000 UNIT CAPS (CHOLECALCIFEROL) Take 1 capsule once a week for 4 weeks, then take 2000 units every day. Added new medication of CALCITRIOL 0.25 MCG CAPS (CALCITRIOL) Take 1 tablet by mouth once a day Added new medication of CATAPRES-TTS-1 0.1 MG/24HR PTWK (CLONIDINE HCL) allpy 1 patch weekly Added new medication of DILTIAZEM HCL CR 120 MG XR24H-CAP (DILTIAZEM HCL) Take 1 capsule by mouth once a day Added new medication of HYDRALAZINE HCL 25 MG TABS (HYDRALAZINE HCL) Take 1 tablet by mouth three times a day Added new medication of FUROSEMIDE 40 MG TABS (FUROSEMIDE) Take 1 tablet by mouth once a day Added new medication of BYSTOLIC 10 MG TABS (NEBIVOLOL HCL) Take 1 tablet by mouth once a day Added new medication of NITROSTAT 0.4 MG SUBL (NITROGLYCERIN) use as directed Added new medication of NEPHRO-VITE 0.8 MG TABS (B COMPLEX-C-FOLIC ACID) Take 1 tablet by mouth at bedtime

## 2010-04-14 NOTE — Progress Notes (Signed)
Summary: Hold Hydralazine  Phone Note Call from Patient Call back at [phone]   Caller: Patient Reason for Call: Talk to Nurse, Talk to Doctor Summary of Call: rtn call from Lauren Initial call taken by: Omer Jack,  March 09, 2010 10:32 AM  Follow-up for Phone Call        I called the above contact #. This was for the pt's husband. He wanted to let us know the pt has been taking the hydralazine once in the morning so she can try to control her BP somewhat. I explained that since she is still itching, she should hold the hydralazine altogether and we will need to find out from Dr. Excell Seltzer if he wants to replace her bp medication with anything different. The pt's SBP is running from 145-155 per the pt's husband.  Patient is aware to hold Hydralazine for now. They are to keep a record of her BP and notify us if her SBP >200.  Whitney Maeola Sarah RN  March 10, 2010 10:03 AM  Follow-up by: Sherri Rad, RN, BSN,  March 09, 2010 11:20 AM

## 2010-04-14 NOTE — Letter (Signed)
Summary: [facility] Patient Note   [facility] Patient Note   Imported By: Roderic Ovens 02/23/2010 10:52:33  _____________________________________________________________________  External Attachment:    Type:   Image     Comment:   External Document

## 2010-04-14 NOTE — Assessment & Plan Note (Signed)
Summary: f2w   Visit Type:  2 weeks follow up Primary Provider:  Ulyess Mort MD  CC:  edema.  History of Present Illness: 75 year-old woman presenting for follow-up. She has multiple medical problems and was recently hospitalized with a NSTEMI, acute on chronic renal failure, CHF, and atrial fibrillation with RVR. Her medical problems are complicated by multiple drug intolerances. 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 I saw her 2 weeks ago with worsening heart failure/volume overload. At that time she presented with increasing leg edema, cough, and dyspnea with minimal exertion. Diuretics were increased to torsemide 80 mg daily and metolazone 5 mg MWF.  She is doing much better. Reports cough has resolved, edema and dyspnea are improved. She continues to have fatigue but no other complaints.  She feels 'dry' and would like to decrease her diuretics.     Current Medications (verified): 1)  Calcitriol 0.25 Mcg Caps (Calcitriol) .... Take 1 Tablet By Mouth Once A Day 2)  Catapres-Tts-1 0.1 Mg/24hr Ptwk (Clonidine Hcl) .... Allpy 1 Patch Weekly 3)  Cartia Xt 120 Mg Xr24h-Cap (Diltiazem Hcl Coated Beads) .... Take 1 Capsule By Mouth Once A Day 4)  Hydralazine Hcl 25 Mg Tabs (Hydralazine Hcl) .... Take 1 Tablet By Mouth Three Times A Day 5)  Bystolic 10 Mg Tabs (Nebivolol Hcl) .... Take 1 Tablet By Mouth Once A Day 6)  Nitrostat 0.4 Mg Subl (Nitroglycerin) .... Use As Directed 7)  Renal 1 Mg Caps (B Complex-C-Folic Acid) .... Take 1 Capsule By Mouth At Bedtime 8)  Aspirin 81 Mg Tbec (Aspirin) .... Take One Tablet By Mouth Daily 9)  Perphenazine-Amitriptyline 2-25 Mg Tabs (Perphenazine-Amitriptyline) .... Take 1 Tab By Mouth At Bedtime 10)  Torsemide 20 Mg Tabs (Torsemide) .... Take 4 Tablets By Mouth Every Morning 11)  Metolazone 5 Mg Tabs (Metolazone) .... Take 30 Minutes Prior To Your Torsemide Dose On Monday, Wednesday and Friday  Allergies: 1)  ! Codeine 2)  *  Hypertensive Medications  Past History:  Past medical history reviewed for relevance to current acute and chronic problems.  Past Medical History: Reviewed history from 10/11/2009 and no changes required. Current Problems:  HYPERTENSION (ICD-401.9) Multiple Medication Intolerance  (ICD-995.27) RENAL INSUFFICIENCY (ICD-588.9) - Stage 4-5 CKD HYPERLIPIDEMIA (ICD-272.4) CHOLELITHIASIS (ICD-574.20) ANXIETY (ICD-300.00) ANEMIA-NOS (ICD-285.9) PAROXYSMAL ATRIAL FIBRILLATION (427.31) NSTEMI - noninvasive eval with Myoview was negative for ischemia.  Review of Systems       Negative except as per HPI   Vital Signs:  Patient profile:   75 year old female Height:      63.5 inches Weight:      118.75 pounds BMI:     20.78 Pulse rate:   60 / minute Pulse rhythm:   regular Resp:     18 per minute BP sitting:   160 / 56  (left arm) Cuff size:   large  Vitals Entered By: Vikki Ports (November 02, 2009 4:36 PM)  Physical Exam  General:  Pt is alert and oriented, in no acute distress. HEENT: normal Neck: normal carotid upstrokes without bruits, JVP normal Lungs: CTA CV: RRR without murmur or gallop Abd: soft, NT, positive BS, no bruit, no organomegaly Ext: trace bilateral pretibial edema. peripheral pulses 2+ and equal Skin: warm and dry without rash    Impression & Recommendations:  Problem # 1:  ACUTE ON CHRONIC DIASTOLIC HEART FAILURE (ICD-428.33) Pt is improved on increased diuretics. Will followup with a BMET and BNP  to be checked tomorrow - this was scheduled last week but she missed the appt. Will adjust diuretics pending lab results. She is complicated to treat because of concomitant advanced renal disease.  Her updated medication list for this problem includes:    Cartia Xt 120 Mg Xr24h-cap (Diltiazem hcl coated beads) .Marland Kitchen... Take 1 capsule by mouth once a day    Bystolic 10 Mg Tabs (Nebivolol hcl) .Marland Kitchen... Take 1 tablet by mouth once a day    Nitrostat 0.4 Mg Subl  (Nitroglycerin) ..... Use as directed    Aspirin 81 Mg Tbec (Aspirin) .Marland Kitchen... Take one tablet by mouth daily    Torsemide 20 Mg Tabs (Torsemide) .Marland Kitchen... Take 4 tablets by mouth every morning    Metolazone 5 Mg Tabs (Metolazone) .Marland Kitchen... Take 30 minutes prior to your torsemide dose on monday, wednesday and friday  Problem # 2:  HYPERTENSION, KIDNEY DISEASE, UNCONTROLLED (ICD-403.00) Continue current therapy for now. Systolic BP high but better than her usual office BP's.  Her updated medication list for this problem includes:    Catapres-tts-1 0.1 Mg/24hr Ptwk (Clonidine hcl) .Marland Kitchen... Allpy 1 patch weekly    Cartia Xt 120 Mg Xr24h-cap (Diltiazem hcl coated beads) .Marland Kitchen... Take 1 capsule by mouth once a day    Hydralazine Hcl 25 Mg Tabs (Hydralazine hcl) .Marland Kitchen... Take 1 tablet by mouth three times a day    Bystolic 10 Mg Tabs (Nebivolol hcl) .Marland Kitchen... Take 1 tablet by mouth once a day    Aspirin 81 Mg Tbec (Aspirin) .Marland Kitchen... Take one tablet by mouth daily    Torsemide 20 Mg Tabs (Torsemide) .Marland Kitchen... Take 4 tablets by mouth every morning    Metolazone 5 Mg Tabs (Metolazone) .Marland Kitchen... Take 30 minutes prior to your torsemide dose on monday, wednesday and friday  Problem # 3:  ATRIAL FIBRILLATION (ICD-427.31) Maintaining sinus rhythm. No coumadin secondary to patient refusal. Continue ASA.  Her updated medication list for this problem includes:    Bystolic 10 Mg Tabs (Nebivolol hcl) .Marland Kitchen... Take 1 tablet by mouth once a day    Aspirin 81 Mg Tbec (Aspirin) .Marland Kitchen... Take one tablet by mouth daily  Patient Instructions: 1)  Your physician recommends that you schedule a follow-up appointment in: 4-6 WEEKS 2)  Your physician recommends that you return for lab work tomorrow: BMP and BNP (428.32, 401.02) Lab hours 8:30-2:00 and 2:30-4:45 3)  Your physician recommends that you continue on your current medications as directed. Please refer to the Current Medication list given to you today. Prescriptions: PERPHENAZINE-AMITRIPTYLINE 2-25  MG TABS (PERPHENAZINE-AMITRIPTYLINE) Take 1 tab by mouth at bedtime  #30 x 0   Entered by:   Julieta Gutting, RN, BSN   Authorized by:   Norva Karvonen, MD   Signed by:   Julieta Gutting, RN, BSN on 11/02/2009   Method used:   Electronically to        [facility] [phone]* (retail)       [address] [place], [place]  [address]       Ph: [phone]       Fax: [phone]   RxID:   [id number]

## 2010-04-14 NOTE — Progress Notes (Signed)
Summary: pt has medication question  Phone Note Call from Patient Call back at [phone]   Caller: Patient Reason for Call: Talk to Nurse, Talk to Doctor Summary of Call: pt is being put on new medication and they want to make sure it will be ok with dr Kuzey Ogata Initial call taken by: Omer Jack,  March 29, 2010 11:59 AM  Follow-up for Phone Call        I spoke with the pt's husband and he said the pt started dialysis this morning.  The pt had dialysis at a facility on [facility] [phone] Morrie Sheldon).  The pt was told to stop her diuretics at dialysis.  The pt's husband would like to know if this is correct.  I made him aware that dialysis is now pulling fluid off of the pt.  I will call the dialysis center to clarify instructions.  Julieta Gutting, RN, BSN  March 29, 2010 12:56 PM  I spoke with Morrie Sheldon at the Dialysis Center and the pt was instructed to stop all diuretics, do not use pepto bismol, and take TUMS with every meal.  I called the pt's husband and made him aware of these instructions and that the Renal physicians will be making changes with her medications and these orders should be followed.  I will forward this information to Dr Excell Seltzer per Mr Madie Reno request.  Julieta Gutting, RN, BSN  March 29, 2010 2:51 PM Follow-up by: Julieta Gutting, RN, BSN,  March 29, 2010 2:58 PM

## 2010-04-14 NOTE — Letter (Signed)
Summary: [facility] Patient Note  [facility] Patient Note   Imported By: Roderic Ovens 02/23/2010 10:51:02  _____________________________________________________________________  External Attachment:    Type:   Image     Comment:   External Document

## 2010-04-14 NOTE — Consult Note (Signed)
Summary: Dr. Bertram Savin  Dr. Bertram Savin   Imported By: Florinda Marker 04/07/2009 14:28:17  _____________________________________________________________________  External Attachment:    Type:   Image     Comment:   External Document

## 2010-04-14 NOTE — Progress Notes (Signed)
Summary: PT REQUEST CALL  Phone Note Call from Patient Call back at Home Phone [phone]   Caller: Patient Summary of Call: PT REQUEST CALL Initial call taken by: Judie Grieve,  October 12, 2009 3:24 PM  Follow-up for Phone Call        spoke w/pts husband he states pt took 3 tabs of lasix last pm and felt worse her BP has been elevated around 180s and her HR has been running from 80s-120s, she also has dev. nausea she feels these symptoms are from the lasix and only took 1 tab this am, tried to explain that these symptoms were from her heart failure and extra fluid she had and that she needed to take the lasix as prescribed to get ride of the fluid the husband and wife both are adament that lasix caused problems and wish to speak w/Dr Excell Seltzer advised he is in hospital today, will try to reach him to discuss but pt may to be admitted Meredith Staggers, RN  October 12, 2009 3:42 PM   Additional Follow-up for Phone Call Additional follow up Details #1::        spoke w/Dr Excell Seltzer he will call and speak w/husband and have her come into hospital Meredith Staggers, RN  October 12, 2009 3:47 PM      Appended Document: PT REQUEST CALL Spoke with patient - she is feeling much better. Swelling has improved and breathing is better. Recommend increasing Torsemide to 40 mg daily and discontinue lasix. Will discuss adding zaroxalyn as needed when I see her next week, but I was unable to adequately explain this to her over the phone. Please call in Torsemide 40 mg daily to [facility] pharmacy - thx.

## 2010-04-14 NOTE — Assessment & Plan Note (Signed)
Summary: ROV   Visit Type:  Follow-up Primary Provider:  Ulyess Mort MD  CC:  Bilateral leg edema.  History of Present Illness: 75 year-old woman presenting for follow-up. She has multiple medical problems and was hospitalized a few months ago with a NSTEMI, acute on chronic renal failure, CHF, and atrial fibrillation with RVR. Her medical problems are complicated by multiple drug intolerances. An AV fistula has been placed as she now has Stage 5 CKD. Diuretics have been increased to control volume - torsemide 80 mg daily and metolazone 5 mg MWF.  Leg swelling is her main complaint, but also complains of exetional dsypnea and fatigue. She quit taking Cartia last week because she felt 'constricted' around her abdomen after each dose. She denies chest pain or palpitations. She saw Dr Caryn Section last week and will have close followup over the next few months as she is approaching need for hemodialysis. She is iron deficient and will be receiving IV iron infusions as well.     Current Medications (verified): 1)  Calcitriol 0.25 Mcg Caps (Calcitriol) .... Take 1 Tablet By Mouth Once A Day 2)  Hydralazine Hcl 25 Mg Tabs (Hydralazine Hcl) .... Take 1 Tablet By Mouth Three Times A Day 3)  Bystolic 10 Mg Tabs (Nebivolol Hcl) .... Take 1 Tablet By Mouth Once A Day 4)  Nitrostat 0.4 Mg Subl (Nitroglycerin) .... Use As Directed 5)  Aspirin 81 Mg Tbec (Aspirin) .... Take One Tablet By Mouth Daily 6)  Torsemide 20 Mg Tabs (Torsemide) .... Tae 4   Tablets By Mouth Every Morning 7)  Metolazone 5 Mg Tabs (Metolazone) .... Take 30 Minutes Prior To Your Torsemide Every Morning 8)  Perphenazine-Amitriptyline 2-25 Mg Tabs (Perphenazine-Amitriptyline) .... Take 1 Tablet By Mouth At Bedtime 9)  Ferrous Sulfate 325 (65 Fe) Mg Tabs (Ferrous Sulfate) .... Take 1 Tablet By Mouth Once A Day  Allergies: 1)  ! Codeine 2)  * Hypertensive Medications  Past History:  Past medical history reviewed for relevance to  current acute and chronic problems.  Past Medical History: Reviewed history from 10/11/2009 and no changes required. Current Problems:  HYPERTENSION (ICD-401.9) Multiple Medication Intolerance  (ICD-995.27) RENAL INSUFFICIENCY (ICD-588.9) - Stage 4-5 CKD HYPERLIPIDEMIA (ICD-272.4) CHOLELITHIASIS (ICD-574.20) ANXIETY (ICD-300.00) ANEMIA-NOS (ICD-285.9) PAROXYSMAL ATRIAL FIBRILLATION (427.31) NSTEMI - noninvasive eval with Myoview was negative for ischemia.  Review of Systems       Negative except as per HPI   Vital Signs:  Patient profile:   75 year old female Height:      63 inches Weight:      122.50 pounds BMI:     21.78 Pulse rate:   65 / minute Pulse rhythm:   regular Resp:     18 per minute BP sitting:   183 / 55  (right arm) Cuff size:   regular  Vitals Entered By: Vikki Ports (January 31, 2010 3:19 PM)  Physical Exam  General:  Pt is alert and oriented, chronically ill-appearing woman in no acute distress. HEENT: normal Neck: normal carotid upstrokes without bruits, JVP moderately  increased Lungs: CTA CV: RRR without murmur or gallop Abd: soft, NT, positive BS, no bruit, no organomegaly Ext:2+ pretibial edema Skin: warm and dry without rash    Impression & Recommendations:  Problem # 1:  ACUTE ON CHRONIC DIASTOLIC HEART FAILURE (ICD-428.33) Continue current medical program. She is tolerating high-dose diuretics and remains volume overloaded but does not appear to be worsening. She has no pulmonary congestion. Will followup in 3  months.  The following medications were removed from the medication list:    Cartia Xt 120 Mg Xr24h-cap (Diltiazem hcl coated beads) .Marland Kitchen... Take 1 capsule by mouth once a day Her updated medication list for this problem includes:    Bystolic 10 Mg Tabs (Nebivolol hcl) .Marland Kitchen... Take 1 tablet by mouth once a day    Nitrostat 0.4 Mg Subl (Nitroglycerin) ..... Use as directed    Aspirin 81 Mg Tbec (Aspirin) .Marland Kitchen... Take one tablet by  mouth daily    Torsemide 20 Mg Tabs (Torsemide) .Marland Kitchen... Tae 4   tablets by mouth every morning    Metolazone 5 Mg Tabs (Metolazone) .Marland Kitchen... Take 30 minutes prior to your torsemide every morning  Problem # 2:  HYPERTENSION, KIDNEY DISEASE, UNCONTROLLED (ICD-403.00) Reports home BP's 130-155/55-65. Continue current meds. Will remove Cartia XT from her med list.  The following medications were removed from the medication list:    Cartia Xt 120 Mg Xr24h-cap (Diltiazem hcl coated beads) .Marland Kitchen... Take 1 capsule by mouth once a day Her updated medication list for this problem includes:    Hydralazine Hcl 25 Mg Tabs (Hydralazine hcl) .Marland Kitchen... Take 1 tablet by mouth three times a day    Bystolic 10 Mg Tabs (Nebivolol hcl) .Marland Kitchen... Take 1 tablet by mouth once a day    Aspirin 81 Mg Tbec (Aspirin) .Marland Kitchen... Take one tablet by mouth daily    Torsemide 20 Mg Tabs (Torsemide) .Marland Kitchen... Tae 4   tablets by mouth every morning    Metolazone 5 Mg Tabs (Metolazone) .Marland Kitchen... Take 30 minutes prior to your torsemide every morning  BP today: 183/55 Prior BP: 169/50 (11/29/2009)  Labs Reviewed: K+: 3.9 (01/24/2010) Creat: : 6.3 (01/24/2010)   Chol: 175 (06/18/2009)   HDL: 51 (06/18/2009)   LDL: 102 (06/18/2009)   TG: 108 (06/18/2009)  Problem # 3:  ATRIAL FIBRILLATION (ICD-427.31) Appears stable without symptomatic palps or chest pain. She has been symptomatic with atrial fib in the past.  Her updated medication list for this problem includes:    Bystolic 10 Mg Tabs (Nebivolol hcl) .Marland Kitchen... Take 1 tablet by mouth once a day    Aspirin 81 Mg Tbec (Aspirin) .Marland Kitchen... Take one tablet by mouth daily  Patient Instructions: 1)  Your physician recommends that you schedule a follow-up appointment in: 3 MONTHS.  2)  Your physician recommends that you continue on your current medications as directed. Please refer to the Current Medication list given to you today.

## 2010-04-14 NOTE — Miscellaneous (Signed)
  Clinical Lists Changes  Medications: Added new medication of ALPRAZOLAM 0.5 MG TABS (ALPRAZOLAM) Take 1 tablet by mouth at bedtime as needed for sleep - Signed Rx of ALPRAZOLAM 0.5 MG TABS (ALPRAZOLAM) Take 1 tablet by mouth at bedtime as needed for sleep;  #20 x 1;  Signed;  Entered by: Ulyess Mort MD;  Authorized by: Ulyess Mort MD;  Method used: Telephoned to [facility] [address]*, [address] [place], [place], [place]  [address], Ph: [phone], Fax: [phone]    Prescriptions: ALPRAZOLAM 0.5 MG TABS (ALPRAZOLAM) Take 1 tablet by mouth at bedtime as needed for sleep  #20 x 1   Entered and Authorized by:   Ulyess Mort MD   Signed by:   Ulyess Mort MD on 09/23/2009   Method used:   Telephoned to ...       [facility] [phone]* (retail)       [address] [place], [place]  [address]       Ph: [phone]       Fax: [phone]   RxID:   [id number]

## 2010-04-14 NOTE — Miscellaneous (Signed)
  Clinical Lists Changes  Medications: Removed medication of PERPHENAZINE-AMITRIPTYLINE 2-25 MG TABS (PERPHENAZINE-AMITRIPTYLINE) Take 1 tab by mouth at bedtime Added new medication of AMITRIPTYLINE HCL 25 MG TABS (AMITRIPTYLINE HCL) Take 1 tablet by mouth at bedtime - Signed Rx of AMITRIPTYLINE HCL 25 MG TABS (AMITRIPTYLINE HCL) Take 1 tablet by mouth at bedtime;  #31 x 11;  Signed;  Entered by: Ulyess Mort MD;  Authorized by: Ulyess Mort MD;  Method used: Electronically to [facility] [address]*, [address] [place], [place], [place]  [address], Ph: [phone], Fax: [phone]    Prescriptions: AMITRIPTYLINE HCL 25 MG TABS (AMITRIPTYLINE HCL) Take 1 tablet by mouth at bedtime  #31 x 11   Entered and Authorized by:   Ulyess Mort MD   Signed by:   Ulyess Mort MD on 11/03/2009   Method used:   Electronically to        [facility] [phone]* (retail)       [address] [place], [place]  [address]       Ph: [phone]       Fax: [phone]   RxID:   [id number]

## 2010-04-14 NOTE — Consult Note (Signed)
[facility]  [facility]   Imported By: Margie Billet 11/10/2009 14:50:09  _____________________________________________________________________  External Attachment:    Type:   Image     Comment:   External Document

## 2010-04-14 NOTE — Assessment & Plan Note (Signed)
Summary:  discuss meds   Visit Type:  Follow-up Primary Provider:  Ulyess Mort MD  CC:  pt complains of fatigue and feeling tired pt thinks its related to the meds.  History of Present Illness: 75 year-old woman presenting for follow-up. She has multiple medical problems and was hospitalized a few months ago with a NSTEMI, acute on chronic renal failure, CHF, and atrial fibrillation with RVR. Her medical problems are complicated by multiple drug intolerances. 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 Diuretics have been increased to control volume - torsemide 80 mg daily and metolazone 5 mg MWF.  She continues to complain of leg swelling and feels very fatigued. She sleeps poorly. Denies orthopnea or PND, but is very tired and can hardly do ADL's because of fatigue. No chest pain or palps.     Current Medications (verified): 1)  Calcitriol 0.25 Mcg Caps (Calcitriol) .... Take 1 Tablet By Mouth Once A Day 2)  Cartia Xt 120 Mg Xr24h-Cap (Diltiazem Hcl Coated Beads) .... Take 1 Capsule By Mouth Once A Day 3)  Hydralazine Hcl 25 Mg Tabs (Hydralazine Hcl) .... Take 1 Tablet By Mouth Three Times A Day 4)  Bystolic 10 Mg Tabs (Nebivolol Hcl) .... Take 1 Tablet By Mouth Once A Day 5)  Nitrostat 0.4 Mg Subl (Nitroglycerin) .... Use As Directed 6)  Aspirin 81 Mg Tbec (Aspirin) .... Take One Tablet By Mouth Daily 7)  Torsemide 20 Mg Tabs (Torsemide) .... Take 3  Tablets By Mouth Every Morning 8)  Metolazone 5 Mg Tabs (Metolazone) .... Take 30 Minutes Prior To Your Torsemide As Needed For Weight Gain of 3 Pounds From Baseline  Allergies: 1)  ! Codeine 2)  * Hypertensive Medications  Past History:  Past medical history reviewed for relevance to current acute and chronic problems.  Past Medical History: Reviewed history from 10/11/2009 and no changes required. Current Problems:  HYPERTENSION (ICD-401.9) Multiple Medication Intolerance  (ICD-995.27) RENAL INSUFFICIENCY  (ICD-588.9) - Stage 4-5 CKD HYPERLIPIDEMIA (ICD-272.4) CHOLELITHIASIS (ICD-574.20) ANXIETY (ICD-300.00) ANEMIA-NOS (ICD-285.9) PAROXYSMAL ATRIAL FIBRILLATION (427.31) NSTEMI - noninvasive eval with Myoview was negative for ischemia.  Review of Systems       Negative except as per HPI   Vital Signs:  Patient profile:   75 year old female Height:      63 inches Weight:      122 pounds BMI:     21.69 Pulse rate:   59 / minute Resp:     18 per minute BP sitting:   169 / 50  (left arm)  Vitals Entered By: Kem Parkinson (November 29, 2009 3:39 PM)  Physical Exam  General:  Pt is alert and oriented, chronically ill-appearing woman in no acute distress. HEENT: normal Neck: normal carotid upstrokes without bruits, JVP normal Lungs: CTA CV: RRR without murmur or gallop Abd: soft, NT, positive BS, no bruit, no organomegaly Ext:2+ pretibial edema Skin: warm and dry without rash    Impression & Recommendations:  Problem # 1:  ACUTE ON CHRONIC DIASTOLIC HEART FAILURE (ICD-428.33) The patient continues to shown signs of volume overload. Will increase the frequency of metolazone to once daily and maintain her current torsemide dosing. I think it is clear that she is declining and we discussed her poor performance status at length today. She and her husband continue to exhibit poor insight into the disease process of CHF. With the presence of advance CKD her management has become much more difficult. The patient  still holds strong feelings that her symptoms come from medication side effect rather than her advanced disease processes.  Her updated medication list for this problem includes:    Cartia Xt 120 Mg Xr24h-cap (Diltiazem hcl coated beads) .Marland Kitchen... Take 1 capsule by mouth once a day    Bystolic 10 Mg Tabs (Nebivolol hcl) .Marland Kitchen... Take 1 tablet by mouth once a day    Nitrostat 0.4 Mg Subl (Nitroglycerin) ..... Use as directed    Aspirin 81 Mg Tbec (Aspirin) .Marland Kitchen... Take one tablet by  mouth daily    Torsemide 20 Mg Tabs (Torsemide) .Marland Kitchen... Take 3  tablets by mouth every morning    Metolazone 5 Mg Tabs (Metolazone) .Marland Kitchen... Take 30 minutes prior to your torsemide every morning  Problem # 2:  HYPERTENSION, KIDNEY DISEASE, UNCONTROLLED (ICD-403.00) Assessment: Unchanged  The following medications were removed from the medication list:    Catapres-tts-1 0.1 Mg/24hr Ptwk (Clonidine hcl) .Marland Kitchen... Allpy 1 patch weekly Her updated medication list for this problem includes:    Cartia Xt 120 Mg Xr24h-cap (Diltiazem hcl coated beads) .Marland Kitchen... Take 1 capsule by mouth once a day    Hydralazine Hcl 25 Mg Tabs (Hydralazine hcl) .Marland Kitchen... Take 1 tablet by mouth three times a day    Bystolic 10 Mg Tabs (Nebivolol hcl) .Marland Kitchen... Take 1 tablet by mouth once a day    Aspirin 81 Mg Tbec (Aspirin) .Marland Kitchen... Take one tablet by mouth daily    Torsemide 20 Mg Tabs (Torsemide) .Marland Kitchen... Take 3  tablets by mouth every morning    Metolazone 5 Mg Tabs (Metolazone) .Marland Kitchen... Take 30 minutes prior to your torsemide every morning  BP today: 169/50 Prior BP: 160/56 (11/02/2009)  Labs Reviewed: K+: 3.5 (11/03/2009) Creat: : 4.6 (11/03/2009)   Chol: 175 (06/18/2009)   HDL: 51 (06/18/2009)   LDL: 102 (06/18/2009)   TG: 108 (06/18/2009)  Problem # 3:  ATRIAL FIBRILLATION (ICD-427.31) Maintaining sinus rhythm.  Her updated medication list for this problem includes:    Bystolic 10 Mg Tabs (Nebivolol hcl) .Marland Kitchen... Take 1 tablet by mouth once a day    Aspirin 81 Mg Tbec (Aspirin) .Marland Kitchen... Take one tablet by mouth daily  Patient Instructions: 1)  Your physician recommends that you schedule a follow-up appointment in: 2 MONTHS 2)  Your physician recommends that you return for lab work in: 2 WEEKS (BMP 428.33, 427.31) 3)  Your physician has recommended you make the following change in your medication: INCREASE Metolazone to everyday Prescriptions: METOLAZONE 5 MG TABS (METOLAZONE) take 30 minutes prior to your Torsemide every morning  #90 x  3   Entered by:   Julieta Gutting, RN, BSN   Authorized by:   Norva Karvonen, MD   Signed by:   Julieta Gutting, RN, BSN on 11/29/2009   Method used:   Electronically to        [facility] [id number] (retail)       [address] [place], [place]  [address]       Ph: [phone]       Fax: [phone]   RxID:   [id number]

## 2010-04-14 NOTE — Letter (Signed)
Summary[facility]   Imported By: Margie Billet 07/20/2009 12:06:35  _____________________________________________________________________  External Attachment:    Type:   Image     Comment:   External Document

## 2010-04-14 NOTE — Consult Note (Signed)
Summary: [facility]  [facility]   Imported By: Louretta Parma 03/10/2010 16:42:24  _____________________________________________________________________  External Attachment:    Type:   Image     Comment:   External Document

## 2010-04-14 NOTE — Progress Notes (Signed)
Summary: Cant sleep//kg  Phone Note Call from Patient Call back at Home Phone [phone] Call back at [phone]   Caller: Walk-in Complaint: Urinary/GYN Problems Summary of Call: Pt arrived in clinic this am with a bottle of his wife's medication.  Would like to know if its ok for wife to take amitrip/perphen 25-2.  Medication was taken off pt's list 02/26/2009.  Pt's has an upcoming appt with Dr Excell Seltzer on 8/01.  Husband states pt can not sleep at night.  D/W Dr Aundria Rud, who stated pt should f/u with Dr Excell Seltzer as already scheduled given her recent hospitalization and ask him if it was ok to resume med since it was removed from list during an visit at that office. Pt's husband aware.Cynda Familia [facility])  October 07, 2009 2:43 PM

## 2010-04-14 NOTE — Progress Notes (Signed)
Summary: perphenzine-amitriptyline rx//kg  Phone Note Call from Patient Call back at [phone]   Caller: Spouse Call For: Lauren Mort MD Reason for Call: Refill Medication Summary of Call: Call from pt's husband requesting that we switch her back to the perphenazine-amitriptyline 2-25mg  one at bedtime.  Pt had been on this med for sometime, but her husband called on 8/24 requesting something less expensive as it was now costing more. It was changed to amitriptyline 25mg .   The husband now states that it does not work as well as the other one and they would like to be switched back. Patient saw Dr Excell Seltzer on 9/19 and the amitriptyline was removed from her list.  Will forward request to PCP for review.Cynda Familia [facility])  December 16, 2009 11:58 AM   Follow-up for Phone Call        There is nothing in Dr. Earmon Phoenix note of 9-19 about stopping elavil.  Did he indeed stop it.  I should not re-start it if he stopped it for a reason.  Please clarify this. Follow-up by: Lauren Mort MD,  December 17, 2009 4:30 PM  Additional Follow-up for Phone Call Additional follow up Details #1::        Received returned call from Dr Earmon Phoenix nurse [facility]).  They do not have a problem with pt taking the perphenazine-amitriptyline 2-25mg .  They were aware when she was taking it before. Please advise.Marland KitchenCynda Familia [facility])  December 20, 2009 11:44 AM     New/Updated Medications: PERPHENAZINE-AMITRIPTYLINE 2-25 MG TABS (PERPHENAZINE-AMITRIPTYLINE) Take 1 tablet by mouth at bedtime Prescriptions: PERPHENAZINE-AMITRIPTYLINE 2-25 MG TABS (PERPHENAZINE-AMITRIPTYLINE) Take 1 tablet by mouth at bedtime  #31 x 11   Entered and Authorized by:   Lauren Mort MD   Signed by:   Lauren Mort MD on 12/20/2009   Method used:   Electronically to        [facility] [id number] (retail)       [address] [place], [place]  [address]       Ph: [phone]       Fax: [phone]  RxID:   [id number]

## 2010-04-14 NOTE — Consult Note (Signed)
Summary: [facility]  [facility]   Imported By: Louretta Parma 03/10/2010 16:41:54  _____________________________________________________________________  External Attachment:    Type:   Image     Comment:   External Document

## 2010-04-14 NOTE — Progress Notes (Signed)
Summary: rx refill  Phone Note Call from Patient Call back at Home Phone [phone]   Caller: Patient Reason for Call: Refill Medication, Talk to Nurse Summary of Call: pt states pharmacy has fax refill. pt needs for the nurse to fax refill to pharmacy.  Initial call taken by: Roe Coombs,  January 14, 2010 11:33 AM  Follow-up for Phone Call        Refill sended to [facility] with 2 refills. Vikki Ports  January 14, 2010 11:40 AM

## 2010-04-14 NOTE — Consult Note (Signed)
Summary: Dr. Freida Busman  Dr. Freida Busman   Imported By: Florinda Marker 06/07/2009 10:39:20  _____________________________________________________________________  External Attachment:    Type:   Image     Comment:   External Document

## 2010-04-14 NOTE — Progress Notes (Signed)
Summary: Hold Hydralazine  Phone Note Call from Patient Call back at [phone]   Caller: Patient Reason for Call: Talk to Nurse Initial call taken by: Judie Grieve,  March 08, 2010 9:06 AM  Follow-up for Phone Call        Pt. still having itching. Believes it is from the Hydralazine. She is taking Benadryl and hydrocortisone cream and is still itching. They would like to change the medication. When she stopped the Hydralazine, it improved. Will need to discuss this with Dr. Excell Seltzer and call them back tomorrow. Whitney Maeola Sarah RN  March 08, 2010 6:00 PM  Attempted to call patient. I left the instructions that it was okay to hold Hydralazine on their voice mail and to call back if they have any further questions/concerns. Whitney Maeola Sarah RN  March 09, 2010 8:19 AM   Follow-up by: Whitney Maeola Sarah RN,  March 08, 2010 6:00 PM  Additional Follow-up for Phone Call Additional follow up Details #1::        ok to hold hydralazine. she is intolerant to numerous medications. Additional Follow-up by: Norva Karvonen, MD,  March 08, 2010 9:41 PM

## 2010-04-14 NOTE — Assessment & Plan Note (Signed)
Summary: f/u and labs//kg   Vital Signs:  Patient profile:   75 year old female Height:      63.5 inches (161.29 cm) Weight:      131.7 pounds (59.86 kg) BMI:     23.05 Pulse rate:   56 / minute BP sitting:   191 / 67  (left arm) Cuff size:   regular  Vitals Entered By: Cynda Familia [facility]) (June 24, 2009 2:12 PM)  Primary Care Provider:  Ulyess Mort MD   History of Present Illness: 89 woman.  Asked her to return today due to labs.  Her creatinine has spiked up, probably due to uncontrolled HTN.  Her urine albumin was 660 in 2009; will check again today and a UA for hematuria.  She had requested A vitamin D level and it was very low --   < 10.  Gets very little sun-exposure. Will give standard replacement of 50,000 q week, then 2000 once daily.  Reviewed screening.  She has had no gyn surgery and is nulliparous.  Insists she had pap in 2002.  Strongly advised a gyn clinic visit for general exam and pap; she will think about this.  Had mammogram on 06-20-07.  Advised repeat now. Advised colonoscopy.  She believes she has had this but is vague on details.   Allergies: 1)  ! Codeine 2)  * Hypertensive Medications   Impression & Recommendations:  Problem # 1:  HYPERTENSION, KIDNEY DISEASE, UNCONTROLLED (ICD-403.00) See HPI.  On new combination for few days.  Already c/o nausea.  I strongly encouraged her to tolerate this and press on.   Her updated medication list for this problem includes:    Torsemide 10 Mg Tabs (Torsemide) .Marland Kitchen... Take 1 tablet by mouth once daily    Azor 5-20 Mg Tabs (Amlodipine-olmesartan) .Marland Kitchen... Take 1 tablet by mouth once a day  Orders: T-Urinalysis ([address]) T-Urine Microalbumin w/creat. ratio [phone])  Problem # 2:  UNSPECIFIED VITAMIN D DEFICIENCY (ICD-268.9) See HPI.  Complete Medication List: 1)  Torsemide 10 Mg Tabs (Torsemide) .... Take 1 tablet by mouth once daily 2)  Azor 5-20 Mg Tabs (Amlodipine-olmesartan) ....  Take 1 tablet by mouth once a day 3)  D3-50 50000 Unit Caps (Cholecalciferol) .... Take 1 capsule once a week for 4 weeks, then take 2000 units every day.  Patient Instructions: 1)  Please schedule a follow-up appointment in 2 months. 2)  Consider going to [facility] for a pap smear. Prescriptions: D3-50 50000 UNIT CAPS (CHOLECALCIFEROL) Take 1 capsule once a week for 4 weeks, then take 2000 units every day.  #4 x 0   Entered and Authorized by:   Ulyess Mort MD   Signed by:   Ulyess Mort MD on 06/24/2009   Method used:   Electronically to        [facility] [id number]* (retail)       [address] [place], [place]  [address]       Ph: [phone]       Fax: [phone]   RxID:   [id number]   Prevention & Chronic Care Immunizations   Influenza vaccine: Not documented    Tetanus booster: Not documented    Pneumococcal vaccine: Not documented    H. zoster vaccine: Not documented  Colorectal Screening   Hemoccult: Not documented    Colonoscopy: Not documented   Colonoscopy action/deferral: Refused  (06/18/2009)  Other Screening  Pap smear: Not documented    Mammogram: Assessment: BIRADS 1.   (06/20/2007)   Mammogram action/deferral: Ordered  (06/18/2009)    DXA bone density scan: Not documented   Smoking status: never  (06/18/2009)    Screening comments: Advised mammogram, pap smear, and colonoscopy today.  Lipids   Total Cholesterol: 175  (06/18/2009)   LDL: 102  (06/18/2009)   LDL Direct: Not documented   HDL: 51  (06/18/2009)   Triglycerides: 108  (06/18/2009)    SGOT (AST): 17  (07/16/2007)   SGPT (ALT): 11  (07/16/2007)   Alkaline phosphatase: 75  (07/16/2007)   Total bilirubin: 0.3  (07/16/2007)    Lipid flowsheet reviewed?: Yes   Progress toward LDL goal: At goal  Self-Management Support :   Personal Goals (by the next clinic visit) :      Personal LDL goal: 100  (02/12/2009)    Lipid  self-management support: Pre-printed educational material, Written self-care plan, Resources for patients handout  (06/18/2009)

## 2010-04-14 NOTE — Letter (Signed)
Summary: Generic Letter  Architectural technologist, Main Office  [address]   [place], [place] [address]   Phone: [phone]  Fax: [phone]        March 24, 2010 MRN: [id number]    [name] [address] [place], [place]  [address]    To Whom it May Concern:  Ms Hamric has developed progressive cardiac and kidney problems and now has had an acute decompensation of her condition. Her medical condition prohibits her husband, [name], from traveling at this time. I would kindly ask that you refund his travel expenses due to his need to stay with his wife.   If there are questions regarding this matter, please feel free to contact me at any time.  Sincerely,   Norva Karvonen, MD  This letter has been electronically signed by your physician.

## 2010-04-14 NOTE — Progress Notes (Signed)
Summary: Need Letter  Phone Note Call from Patient   Caller: Pt's husband in the office Summary of Call: The pt's husband came into the office today because he needs a letter dictated by Dr Excell Seltzer stating the reason why the pt cannot travel from a cardiac standpoint.  The pt had purchased tickets in December 2010 for a wedding in [place] in February 2011.  The pt had to have her gallbladder removed and was not able to travel in February. Dr Bertram Savin wrote a letter at that time so that the flight could be cancelled. The airline company did not refund the pt's money but told them they would have to use the tickets within the next year.  The pt has not been able to travel and the pt would like to get money back for tickets. I will forward message to Dr Excell Seltzer.  Initial call taken by: Julieta Gutting, RN, BSN,  February 15, 2010 11:56 AM  Follow-up for Phone Call        letter dictated Follow-up by: Norva Karvonen, MD,  February 16, 2010 9:49 AM  Additional Follow-up for Phone Call Additional follow up Details #1::        I spoke with the pt's husband and made him aware that letter is ready and at the front desk for pick-up. Additional Follow-up by: Julieta Gutting, RN, BSN,  February 22, 2010 1:23 PM

## 2010-04-14 NOTE — Letter (Signed)
Summary: [facility] - Office Note  [facility] - Office Note   Imported By: Marylou Mccoy 12/20/2009 10:44:05  _____________________________________________________________________  External Attachment:    Type:   Image     Comment:   External Document

## 2010-04-14 NOTE — Assessment & Plan Note (Signed)
Summary: ROUTINE CK/EST/VS   Vital Signs:  Patient profile:   75 year old female Height:      63.5 inches (161.29 cm) Weight:      132.0 pounds (60.00 kg) BMI:     23.10 Temp:     98.6 degrees F (37.00 degrees C) oral Pulse rate:   50 / minute BP sitting:   201 / 58  (left arm) Cuff size:   regular  Vitals Entered By: Cynda Familia Duncan Dull) (June 18, 2009 9:43 AM) CC: routine f/u, pt no longer taking the Micardis, requestlabwork to check lipids and vit D  Is Patient Diabetic? No Pain Assessment Patient in pain? no      Nutritional Status BMI of 19 -24 = normal  Have you ever been in a relationship where you felt threatened, hurt or afraid?Unable to ask   Does patient need assistance? Functional Status Self care Ambulation Normal   Primary Care Provider:  Ulyess Mort MD  CC:  routine f/u, pt no longer taking the Micardis, and requestlabwork to check lipids and vit D .  History of Present Illness: 37 woman with HTN and intolerance to every class of meds.  Presently insisting that micardis, which she had tolerated before, is now causing leg pain and nausea.  Wants to try what her sister is taking -- Azor -- which turns out to be a combination of amlodipine and olmisartan.  Why not?!  Preventive Screening-Counseling & Management  Alcohol-Tobacco     Smoking Status: never  Allergies: 1)  ! Codeine 2)  * Hypertensive Medications  Physical Exam  General:  well-developed and well-nourished.   Eyes:  anicteric.  nl injection Neck:  no carotid bruits.   Lungs:  normal respiratory effort, no crackles, and no wheezes.   Heart:  normal rate, regular rhythm, and no murmur.   Extremities:  no edema   Impression & Recommendations:  Problem # 1:  ATRIAL FIBRILLATION (ICD-427.31)  Intermittent.  pulse regular today.  See Dr. Earmon Phoenix notes.  He has repeatedly advised her to take coumadin.  She refuses.  Her updated medication list for this problem includes:    Azor  5-20 Mg Tabs (Amlodipine-olmesartan) .Marland Kitchen... Take 1 tablet by mouth once a day  Problem # 2:  HYPERTENSION (ICD-401.9)  Systolic HTN as usual. Start Azor 5/20.  Stop micardis.  Continue torsemide   The following medications were removed from the medication list:    Micardis 20 Mg Tabs (Telmisartan) .Marland Kitchen... Take 1 tablet by mouth once a day Her updated medication list for this problem includes:    Torsemide 10 Mg Tabs (Torsemide) .Marland Kitchen... Take 1 tablet by mouth once daily    Azor 5-20 Mg Tabs (Amlodipine-olmesartan) .Marland Kitchen... Take 1 tablet by mouth once a day  Orders: T-Basic Metabolic Panel [id number]) T-Lipid Profile [id number]) T-TSH [id number]) T-Vitamin D (25-Hydroxy) [phone])  Complete Medication List: 1)  Torsemide 10 Mg Tabs (Torsemide) .... Take 1 tablet by mouth once daily 2)  Azor 5-20 Mg Tabs (Amlodipine-olmesartan) .... Take 1 tablet by mouth once a day  Other Orders: Mammogram (Screening) (Mammo)  Patient Instructions: 1)  Please schedule a follow-up appointment in 2 months. Prescriptions: AZOR 5-20 MG TABS (AMLODIPINE-OLMESARTAN) Take 1 tablet by mouth once a day  #31 x 11   Entered and Authorized by:   Ulyess Mort MD   Signed by:   Ulyess Mort MD on 06/18/2009   Method used:   Electronically to        [facility]  [address] (retail)       [address] [place], [place]  [address]       Ph: [phone]       Fax: [phone]   RxID:   [id number] TORSEMIDE 10 MG TABS (TORSEMIDE) Take 1 tablet by mouth once daily  #31 x 11   Entered and Authorized by:   Ulyess Mort MD   Signed by:   Ulyess Mort MD on 06/18/2009   Method used:   Electronically to        [facility] [address] (retail)       [address] [place], [place]  [address]       Ph: [phone]       Fax: [phone]   RxID:   [id number]     Prevention & Chronic Care Immunizations   Influenza vaccine:  Not documented    Tetanus booster: Not documented    Pneumococcal vaccine: Not documented    H. zoster vaccine: Not documented  Colorectal Screening   Hemoccult: Not documented    Colonoscopy: Not documented   Colonoscopy action/deferral: Refused  (06/18/2009)  Other Screening   Pap smear: Not documented    Mammogram: Assessment: BIRADS 1.   (06/20/2007)   Mammogram action/deferral: Ordered  (06/18/2009)    DXA bone density scan: Not documented   Smoking status: never  (06/18/2009)  Lipids   Total Cholesterol: 204  (07/16/2007)   LDL: 119  (07/16/2007)   LDL Direct: Not documented   HDL: 41  (07/16/2007)   Triglycerides: 219  (07/16/2007)    SGOT (AST): 17  (07/16/2007)   SGPT (ALT): 11  (07/16/2007)   Alkaline phosphatase: 75  (07/16/2007)   Total bilirubin: 0.3  (07/16/2007)    Lipid flowsheet reviewed?: Yes  Self-Management Support :   Personal Goals (by the next clinic visit) :      Personal LDL goal: 100  (02/12/2009)    Patient will work on the following items until the next clinic visit to reach self-care goals:     Medications and monitoring: take my medicines every day  (06/18/2009)     Eating: use fresh or frozen vegetables, eat foods that are low in salt  (06/18/2009)    Lipid self-management support: Pre-printed educational material, Written self-care plan, Resources for patients handout  (06/18/2009)   Lipid self-care plan printed.      Resource handout printed.   Nursing Instructions: Schedule screening mammogram (see order)    Process Orders Check Orders Results:     [facility]: ABN not required for this insurance Tests Sent for requisitioning (June 18, 2009 12:17 PM):     06/18/2009: [facility] -- T-Basic Metabolic Panel [phone] (signed)     06/18/2009: [facility] -- T-Lipid Profile [phone] (signed)     06/18/2009: [facility] -- T-TSH [phone]  (signed)     06/18/2009: [facility] -- T-Vitamin D (25-Hydroxy) [phone] (signed)

## 2010-04-14 NOTE — Medication Information (Signed)
Summary: [facility] Order  [facility] Order   Imported By: Roderic Ovens 02/24/2010 15:35:24  _____________________________________________________________________  External Attachment:    Type:   Image     Comment:   External Document

## 2010-04-14 NOTE — Progress Notes (Signed)
Summary: Pharmacy called with medication interactions  Phone Note From Pharmacy Call back at [phone]   Caller: [facility] [phone] Request: Speak with Nurse, Speak with Provider Summary of Call: The generic triavil 2-25mg  that was perscribed for pt yesterday they are getting several flags that it is interaction with some of the meds she is already on. Initial call taken by: Omer Jack,  November 03, 2009 9:38 AM  Follow-up for Phone Call        Spoke with Clydie Braun the pharmacist at [facility] pharmacy. Dr. Excell Seltzer prescrived (genetic name) Triavil 2-25 mg on 11/02/09. The Pharmacist states there are many contraindications of triavil with the medications pt. is taken ( cardiovascular, sedation, Hypotension). Clydie Braun wants to make sure Dr. Excell Seltzer still  wants for pt. to have this medication. Follow-up by: Ollen Gross, RN, BSN,  November 03, 2009 10:45 AM  Additional Follow-up for Phone Call Additional follow up Details #1::        Probably better for pt to discuss with Dr Aundria Rud. May be best not to take it since she has developed so many medical problems. Additional Follow-up by: Norva Karvonen, MD,  November 03, 2009 10:32 PM    Additional Follow-up for Phone Call Additional follow up Details #2::    I spoke with the pt's husband and made him aware that Dr Excell Seltzer recommends that Dr Aundria Rud be contacted about filling Triavil.  He said he went to [facility] yesterday and the 30 tablets where $40.  He refused to pay for the medicine because it normally cost $9.  He has already contacted Dr William Hamburger office for further recommendations about changing this medication and is waiting for a callback.  I also made him aware of the pt's lab results. The pt will change Metolazone dose to as needed for weight gain of 3 pounds from baseline and decrease Torsemide to 60mg  daily.     Follow-up by: Julieta Gutting, RN, BSN,  November 04, 2009 1:05 PM  New/Updated Medications: TORSEMIDE 20 MG TABS (TORSEMIDE)  take 3  tablets by mouth every morning METOLAZONE 5 MG TABS (METOLAZONE) take 30 minutes prior to your Torsemide as needed for weight gain of 3 pounds from baseline

## 2010-04-14 NOTE — Assessment & Plan Note (Signed)
Summary: Medication issues   Visit Type:  Follow-up Primary Provider:  Ulyess Mort MD  CC:  Medication issues.  History of Present Illness: 75 year-old woman presenting for follow-up. She has multiple medical problems and was hospitalized last year with a NSTEMI, acute on chronic renal failure, CHF, and atrial fibrillation with RVR. Her medical problems are complicated by multiple drug intolerances. An AV fistula has been placed as she now has Stage 5 CKD. Diuretics have been increased to control volume - torsemide 80 mg daily and metolazone 5 mg MWF.  The patient has multiple complaints today. She complains of shortness of breath, poor appetite, and leg swelling. She attributes much of this to medication side-effect. She also complains of orthopnea and PND. Complains of episodic increases in her heart rate with palpitations.   Current Medications (verified): 1)  Calcitriol 0.25 Mcg Caps (Calcitriol) .... Take 1 Tablet By Mouth Once A Day 2)  Hydralazine Hcl 25 Mg Tabs (Hydralazine Hcl) .... Take 1 Tablet By Mouth Three Times A Day 3)  Bystolic 10 Mg Tabs (Nebivolol Hcl) .... Take 1 Tablet By Mouth Once A Day 4)  Nitrostat 0.4 Mg Subl (Nitroglycerin) .... Use As Directed 5)  Aspirin 81 Mg Tbec (Aspirin) .... Take One Tablet By Mouth Daily 6)  Torsemide 20 Mg Tabs (Torsemide) .... Tae 4   Tablets By Mouth Every Morning 7)  Metolazone 5 Mg Tabs (Metolazone) .... Take 30 Minutes Prior To Your Torsemide Every Morning 8)  Ferrous Sulfate 325 (65 Fe) Mg Tabs (Ferrous Sulfate) .... Take 1 Tablet By Mouth Once A Day  Allergies: 1)  ! Codeine 2)  * Hypertensive Medications  Past History:  Past medical history reviewed for relevance to current acute and chronic problems.  Past Medical History: Reviewed history from 10/11/2009 and no changes required. Current Problems:  HYPERTENSION (ICD-401.9) Multiple Medication Intolerance  (ICD-995.27) RENAL INSUFFICIENCY (ICD-588.9) - Stage 4-5  CKD HYPERLIPIDEMIA (ICD-272.4) CHOLELITHIASIS (ICD-574.20) ANXIETY (ICD-300.00) ANEMIA-NOS (ICD-285.9) PAROXYSMAL ATRIAL FIBRILLATION (427.31) NSTEMI - noninvasive eval with Myoview was negative for ischemia.  Review of Systems       Negative except as per HPI   Vital Signs:  Patient profile:   75 year old female Height:      63 inches Weight:      122.75 pounds BMI:     21.82 Pulse rate:   58 / minute Pulse rhythm:   regular Resp:     18 per minute BP sitting:   180 / 70  (right arm) Cuff size:   regular  Vitals Entered By: Vikki Ports (March 24, 2010 12:18 PM)  Physical Exam  General:  Pt is alert and oriented, chronically ill-appearing woman in no acute distress. HEENT: normal Neck: normal carotid upstrokes without bruits, JVP increased Lungs: CTA CV: RRR without murmur or gallop Abd: soft, NT, positive BS, no bruit, no organomegaly Ext: marked bilateral pretibial edema Skin: warm and dry without rash    EKG  Procedure date:  03/24/2010  Findings:      Sinus brady 58 bpm, nonspecific ST-T changes, otherwise within normal limits.  Impression & Recommendations:  Problem # 1:  ACUTE ON CHRONIC DIASTOLIC HEART FAILURE (ICD-428.33) Pt with progressive symptoms of volume overload related to her cardiac and progressive renal decline. She is on high-dose diuretics and I think she need hemodialysis. I spoke with Dr Allena Katz who has agreed to arrange close followup tomorrow and likely dialysis early next week. Will temporarily increase torsemide and metolazone until she  begins dialysis. Long discussion with the patient and her husband regarding these issues today.  Her updated medication list for this problem includes:    Bystolic 10 Mg Tabs (Nebivolol hcl) .Marland Kitchen... Take 1 tablet by mouth once a day    Nitrostat 0.4 Mg Subl (Nitroglycerin) ..... Use as directed    Aspirin 81 Mg Tbec (Aspirin) .Marland Kitchen... Take one tablet by mouth daily    Torsemide 20 Mg Tabs (Torsemide) .Marland Kitchen...  Take 4  tablets by mouth every morning and every evening    Metolazone 10 Mg Tabs (Metolazone) .Marland Kitchen... Take 30 minutes prior to your torsemide every morning  Orders: EKG w/ Interpretation ([address])  Problem # 2:  ATRIAL FIBRILLATION (ICD-427.31) Maintaining sinus rhythm today, but may be having paroxysms of AF. Advised to take an extra bystolic if this occurs again.  Her updated medication list for this problem includes:    Bystolic 10 Mg Tabs (Nebivolol hcl) .Marland Kitchen... Take 1 tablet by mouth once a day    Aspirin 81 Mg Tbec (Aspirin) .Marland Kitchen... Take one tablet by mouth daily  Orders: EKG w/ Interpretation ([address])  Patient Instructions: 1)  Your physician recommends that you schedule a follow-up appointment in: 2 MONTHS 2)  Your physician has recommended you make the following change in your medication: You can take an extra Bystolic if you have an episode when your heart rate goes higher than 100  3)  Your physician has recommended you make the following change in your medication: please increase your metozalone to 10mg  everyday--please increase your torsemide to 80mg  twice/day 4)  You have been referred to--please go to [facility] tomorrow 03/25/10 at 1030 am Prescriptions: TORSEMIDE 20 MG TABS (TORSEMIDE) take 4  tablets by mouth every morning and every evening  #720 x 3   Entered by:   Julieta Gutting, RN, BSN   Authorized by:   Norva Karvonen, MD   Signed by:   Julieta Gutting, RN, BSN on 03/24/2010   Method used:   Electronically to        [facility] [address] (retail)       [address] [place], [place]  [address]       Ph: [phone]       Fax: [phone]   RxID:   [id number] METOLAZONE 10 MG TABS (METOLAZONE) take 30 minutes prior to your Torsemide every morning  #90 x 3   Entered by:   Julieta Gutting, RN, BSN   Authorized by:   Norva Karvonen, MD   Signed by:   Julieta Gutting, RN, BSN on 03/24/2010   Method used:   Electronically to          [facility] [address] (retail)       [address] [place], [place]  [address]       Ph: [phone]       Fax: [phone]   RxID:   [id number]

## 2010-04-14 NOTE — Consult Note (Signed)
Summary: Dr. Bertram Savin  Dr. Bertram Savin   Imported By: Florinda Marker 04/08/2009 11:57:19  _____________________________________________________________________  External Attachment:    Type:   Image     Comment:   External Document

## 2010-04-14 NOTE — Progress Notes (Signed)
Summary: Side Effects from Medication   Phone Note Call from Patient   Caller: **PT'S HUSBAND WALKED INTO THE OFFICE TO DISCUSS PT** Summary of Call: The pt's husband came into the office today to discuss the pt's medications. He is scheduled to leave the country from 1/13-1/29[place]12 and would like to get her medications changed prior to his departure.  He said the pt is having side effects from her medications and that she held each medication for one week to see if her symptoms improved.  The pt's Metolazone is causing nausea and loss of appetite, the pt's symptoms improved while off of medication.  The pt's Hydralazine is causing itching and this improved while on hold.  The pt's Bystolic causes SOB and she feels better when off of this med.  At this time the pt is currently taking all of these medications as prescribed (the pt has not started dialysis).  I will forward this message to Dr Excell Seltzer for review.  Initial call taken by: Julieta Gutting, RN, BSN,  March 18, 2010 9:52 AM  Follow-up for Phone Call        I don't know where to start. She has side-effects attributed to every medication she takes and requires multidrug Rx for severe HTN. Would continue all meds until seen in followup - I would be happy to see sooner than her scheduled followup appt to review this. As she is approaching need for dialysis, I suspect many of her symptoms are related to advance CKD. Follow-up by: Norva Karvonen, MD,  March 21, 2010 11:19 PM     Appended Document: Side Effects from Medication  I spoke with the pt's husband and arranged OV on 03/24/10 with Dr Excell Seltzer.

## 2010-04-14 NOTE — Progress Notes (Signed)
  Walk in Patient Form Recieved " Pt has Questions for Cooper/Lauren sent to Lauren. [facility] [place]  March 02, 2010 11:48 AM

## 2010-04-14 NOTE — Assessment & Plan Note (Signed)
Summary: f1w   Visit Type:  1 week follow up Primary Provider:  Ulyess Mort MD  CC:  Bilateral leg edema.  History of Present Illness: 75 year-old woman presenting for follow-up. She has multiple medical problems and was recently hospitalized with a NSTEMI, acute on chronic renal failure, CHF, and atrial fibrillation with RVR. Her medical problems are complicated by multiple drug intolerances and limited insight into her medical problems.  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 She was seen last week with progressive signs of CHF and lower extremity edema. Creatinine was 4 mg/dL and BNP was elevated about 1000. Furosemide was increased from 40 mg daily to 120 mg two times a day - she did not tolerate furosemide so I switched her to torsemide 40 mg (tried to prescribe higher dose but she would not take). She feels the pills make her more short of breath.  She complains of marked fatigue. She reports continued leg swelling but some improvement since last visit. She is limited by shortness of breath - unable to do ADL's because of dyspnea. No orthopnea or PND. She is not having much of a response to torsemide.     Current Medications (verified): 1)  Calcitriol 0.25 Mcg Caps (Calcitriol) .... Take 1 Tablet By Mouth Once A Day 2)  Catapres-Tts-1 0.1 Mg/24hr Ptwk (Clonidine Hcl) .... Allpy 1 Patch Weekly 3)  Cartia Xt 120 Mg Xr24h-Cap (Diltiazem Hcl Coated Beads) .... Take 1 Capsule By Mouth Once A Day 4)  Hydralazine Hcl 25 Mg Tabs (Hydralazine Hcl) .... Take 1 Tablet By Mouth Three Times A Day 5)  Bystolic 10 Mg Tabs (Nebivolol Hcl) .... Take 1 Tablet By Mouth Once A Day 6)  Nitrostat 0.4 Mg Subl (Nitroglycerin) .... Use As Directed 7)  Renal 1 Mg Caps (B Complex-C-Folic Acid) .... Take 1 Capsule By Mouth At Bedtime 8)  Aspirin 81 Mg Tbec (Aspirin) .... Take One Tablet By Mouth Daily 9)  Perphenazine-Amitriptyline 2-25 Mg Tabs (Perphenazine-Amitriptyline) .... Take 1 Tab  By Mouth At Bedtime 10)  Torsemide 20 Mg Tabs (Torsemide) .... Take 2 Tablets Once A Day  Allergies: 1)  ! Codeine 2)  * Hypertensive Medications  Past History:  Past medical history reviewed for relevance to current acute and chronic problems.  Past Medical History: Reviewed history from 10/11/2009 and no changes required. Current Problems:  HYPERTENSION (ICD-401.9) Multiple Medication Intolerance  (ICD-995.27) RENAL INSUFFICIENCY (ICD-588.9) - Stage 4-5 CKD HYPERLIPIDEMIA (ICD-272.4) CHOLELITHIASIS (ICD-574.20) ANXIETY (ICD-300.00) ANEMIA-NOS (ICD-285.9) PAROXYSMAL ATRIAL FIBRILLATION (427.31) NSTEMI - noninvasive eval with Myoview was negative for ischemia.  Review of Systems       complains of low back pain after straining her back recently, also positive for insomnia. Otherwise negative except as per HPI   Vital Signs:  Patient profile:   75 year old female Height:      63.5 inches Weight:      125.25 pounds BMI:     21.92 Pulse rate:   72 / minute Pulse rhythm:   regular Resp:     18 per minute BP sitting:   180 / 60  (right arm) Cuff size:   large  Vitals Entered By: Vikki Ports (October 21, 2009 9:08 AM)  Physical Exam  General:  Pt is alert and oriented, chronically ill-appearing woman in no acute distress. HEENT: normal Neck: normal carotid upstrokes without bruits, JVP moderately elevated Lungs: CTA CV: RRR without murmur or gallop Abd: soft, NT, positive BS,  no bruit, Mildly distended Ext: 2+ bilateral pretibial and pedal edema, softer than at last visit. peripheral pulses 2+ and equal Skin: warm and dry without rash    Impression & Recommendations:  Problem # 1:  ACUTE ON CHRONIC DIASTOLIC HEART FAILURE (ICD-428.33) The patient remains volume overloaded secondary to a combination of her cardiac and renal disease. She has fairly advanced CKD and I think she is likely approaching the need for hemodialysis to manage volume. I am going to increase  her torsemide to 80 mg daily and add metolazone 5 mg tomorrow then to be taken MWF prior to torsemide. Will follow-up with a BMET and BNP next week. She is due to see Dr Caryn Section next month.  She continues to blame medications for nearly all of her symptoms. I had a very frank discussion with the patient and her husband today that the medications are an absolute necessity in treatment of her chronic disease. I reinforced that her symptoms of shortness of breath are not related to the pills, but rather to her underlying CHF.   Her updated medication list for this problem includes:    Cartia Xt 120 Mg Xr24h-cap (Diltiazem hcl coated beads) .Marland Kitchen... Take 1 capsule by mouth once a day    Bystolic 10 Mg Tabs (Nebivolol hcl) .Marland Kitchen... Take 1 tablet by mouth once a day    Nitrostat 0.4 Mg Subl (Nitroglycerin) ..... Use as directed    Aspirin 81 Mg Tbec (Aspirin) .Marland Kitchen... Take one tablet by mouth daily    Torsemide 20 Mg Tabs (Torsemide) .Marland Kitchen... Take 4 tablets by mouth every morning    Metolazone 5 Mg Tabs (Metolazone) .Marland Kitchen... Take 30 minutes prior to your torsemide dose on monday, wednesday and friday  Problem # 2:  ATRIAL FIBRILLATION (ICD-427.31) She is maintaining NSR. Not a candidate for coumadin because she refuses.  Her updated medication list for this problem includes:    Bystolic 10 Mg Tabs (Nebivolol hcl) .Marland Kitchen... Take 1 tablet by mouth once a day    Aspirin 81 Mg Tbec (Aspirin) .Marland Kitchen... Take one tablet by mouth daily  Problem # 3:  HYPERTENSION, KIDNEY DISEASE, UNCONTROLLED (ICD-403.00) BP elevated. Diuretics increased to help with volume. No other med changes made today.  Her updated medication list for this problem includes:    Catapres-tts-1 0.1 Mg/24hr Ptwk (Clonidine hcl) .Marland Kitchen... Allpy 1 patch weekly    Cartia Xt 120 Mg Xr24h-cap (Diltiazem hcl coated beads) .Marland Kitchen... Take 1 capsule by mouth once a day    Hydralazine Hcl 25 Mg Tabs (Hydralazine hcl) .Marland Kitchen... Take 1 tablet by mouth three times a day    Bystolic 10 Mg  Tabs (Nebivolol hcl) .Marland Kitchen... Take 1 tablet by mouth once a day    Aspirin 81 Mg Tbec (Aspirin) .Marland Kitchen... Take one tablet by mouth daily    Torsemide 20 Mg Tabs (Torsemide) .Marland Kitchen... Take 4 tablets by mouth every morning    Metolazone 5 Mg Tabs (Metolazone) .Marland Kitchen... Take 30 minutes prior to your torsemide dose on monday, wednesday and friday  BP today: 180/60 Prior BP: 160/60 (10/11/2009)  Labs Reviewed: K+: 4.3 (10/11/2009) Creat: : 4.0 (10/11/2009)   Chol: 175 (06/18/2009)   HDL: 51 (06/18/2009)   LDL: 102 (06/18/2009)   TG: 108 (06/18/2009)  Patient Instructions: 1)  Your physician recommends that you schedule a follow-up appointment in: 2 WEEKS 2)  Your physician recommends that you return for lab work on: 10/26/09 (BMP and BNP 428.33) 3)  Your physician has recommended you make the following  change in your medication: START Metolazone 5mg  on Monday, Wednesday and Friday---you need to take this medication 30 minutes prior to Torsemide dose,  INCREASE Torsemide to 20mg  four tablets every morning Prescriptions: TORSEMIDE 20 MG TABS (TORSEMIDE) take 4 tablets by mouth every morning  #120 x 1   Entered by:   Julieta Gutting, RN, BSN   Authorized by:   Norva Karvonen, MD   Signed by:   Julieta Gutting, RN, BSN on 10/21/2009   Method used:   Electronically to        [facility] [address] (retail)       [address] [place], [place]  [address]       Ph: [phone]       Fax: [phone]   RxID:   [id number] METOLAZONE 5 MG TABS (METOLAZONE) take 30 minutes prior to your Torsemide dose on Monday, Wednesday and Friday  #30 x 1   Entered by:   Julieta Gutting, RN, BSN   Authorized by:   Norva Karvonen, MD   Signed by:   Julieta Gutting, RN, BSN on 10/21/2009   Method used:   Electronically to        [facility] [address] (retail)       [address] [place], [place]  [address]       Ph: [phone]       Fax: [phone]    RxID:   [id number]

## 2010-04-14 NOTE — Progress Notes (Signed)
Summary: Pt request call  Phone Note Call from Patient Call back at Home Phone [phone]   Caller: Patient Summary of Call: Pt request call  Initial call taken by: Judie Grieve,  October 04, 2009 11:57 AM  Follow-up for Phone Call        I spoke with the pt's husband and he would like the pt seen ASAP by Dr Excell Seltzer.  He said the pt's medications are causing swelling in her legs.  Dr Excell Seltzer is in the office on 8/1/11and an appt was given at 9:30.  Follow-up by: Julieta Gutting, RN, BSN,  October 04, 2009 4:24 PM

## 2010-04-14 NOTE — Assessment & Plan Note (Signed)
Summary: eph   Visit Type:  post-hospital Primary Provider:  Ulyess Mort MD  CC:  Bilateral leg edema- Sob.  History of Present Illness: 75 year-old woman presenting for hospital follow-up. She has multiple medical problems and was recently hospitalized with a NSTEMI, acute on chronic renal failure, CHF, and atrial fibrillation with RVR. Her medical problems are complicated by multiple drug intolerances and limited insight into her medical problems.  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 Volume management during her hospital stay was somewhat challenging and she developed acute pulmonary edema on 2 occasions.  Today she complains of worsening leg swelling over the past 3 days. She also complains of dyspnea with exertion. She has not been sleeping well at night, but sleeps a lot during the day. She denies orthopnea or PND. She went to see Dr Caryn Section in follow-up, but he was called to the hospital and she refused to see a PA/NP.    Current Medications (verified): 1)  Alprazolam 0.5 Mg Tabs (Alprazolam) .... Take 1 Tablet By Mouth At Bedtime As Needed For Sleep 2)  Calcitriol 0.25 Mcg Caps (Calcitriol) .... Take 1 Tablet By Mouth Once A Day 3)  Catapres-Tts-1 0.1 Mg/24hr Ptwk (Clonidine Hcl) .... Allpy 1 Patch Weekly 4)  Cartia Xt 120 Mg Xr24h-Cap (Diltiazem Hcl Coated Beads) .... Take 1 Capsule By Mouth Once A Day 5)  Hydralazine Hcl 25 Mg Tabs (Hydralazine Hcl) .... Take 1 Tablet By Mouth Three Times A Day 6)  Furosemide 40 Mg Tabs (Furosemide) .... Take 1 Tablet By Mouth Once A Day 7)  Bystolic 10 Mg Tabs (Nebivolol Hcl) .... Take 1 Tablet By Mouth Once A Day 8)  Nitrostat 0.4 Mg Subl (Nitroglycerin) .... Use As Directed 9)  Renal 1 Mg Caps (B Complex-C-Folic Acid) .... Take 1 Capsule By Mouth At Bedtime 10)  Aspirin 81 Mg Tbec (Aspirin) .... Take One Tablet By Mouth Daily 11)  Perphenazine-Amitriptyline 2-25 Mg Tabs (Perphenazine-Amitriptyline)  .... Take 1 Tab By Mouth At Bedtime 12)  Vitamin D 1000 Unit Tabs (Cholecalciferol) .... Take 1 Tablet By Mouth Once A Day  Allergies: 1)  ! Codeine 2)  * Hypertensive Medications  Past History:  Past medical history reviewed for relevance to current acute and chronic problems.  Past Medical History: Current Problems:  HYPERTENSION (ICD-401.9) Multiple Medication Intolerance  (ICD-995.27) RENAL INSUFFICIENCY (ICD-588.9) - Stage 4-5 CKD HYPERLIPIDEMIA (ICD-272.4) CHOLELITHIASIS (ICD-574.20) ANXIETY (ICD-300.00) ANEMIA-NOS (ICD-285.9) PAROXYSMAL ATRIAL FIBRILLATION (427.31) NSTEMI - noninvasive eval with Myoview was negative for ischemia.  Review of Systems       Positive for fatigue, otherwise negative except as per HPI   Vital Signs:  Patient profile:   75 year old female Height:      63.5 inches Weight:      125.25 pounds BMI:     21.92 Pulse rate:   75 / minute Pulse rhythm:   regular Resp:     18 per minute BP sitting:   160 / 60  (left arm) Cuff size:   large  Vitals Entered By: Vikki Ports (October 11, 2009 2:38 PM)  Physical Exam  General:  Pt is alert and oriented, chronically ill-appearing woman in no acute distress. HEENT: normal Neck: normal carotid upstrokes without bruits, JVP moderately elevated Lungs: CTA CV: RRR without murmur or gallop Abd: soft, NT, positive BS, no bruit, no organomegaly Ext: 2+ bilateral pretibial and pedal edema. peripheral pulses 2+ and equal Skin: warm  and dry without rash    EKG  Procedure date:  10/11/2009  Findings:      Abnormal p wave morphology suspect ectopic atrial rhythm, HR 75 bpm, no significant ST-T changes.  Impression & Recommendations:  Problem # 1:  ACUTE ON CHRONIC DIASTOLIC HEART FAILURE (ICD-428.33) The pt has clinical signs of volume overload/CHF. Recommend increase lasix to 120 mg two times a day from current dose of 40 mg daily. Will repeat a metabolic panel today to rule out progression of  her kidney disease. I suspect she is approaching the need for hemodialysis.  The following medications were removed from the medication list:    Losartan Potassium 50 Mg Tabs (Losartan potassium) .Marland Kitchen... Take 1 tablet by mouth once a day Her updated medication list for this problem includes:    Cartia Xt 120 Mg Xr24h-cap (Diltiazem hcl coated beads) .Marland Kitchen... Take 1 capsule by mouth once a day    Furosemide 40 Mg Tabs (Furosemide) .Marland Kitchen... Take 3 tablets by mouth two times a day    Bystolic 10 Mg Tabs (Nebivolol hcl) .Marland Kitchen... Take 1 tablet by mouth once a day    Nitrostat 0.4 Mg Subl (Nitroglycerin) ..... Use as directed    Aspirin 81 Mg Tbec (Aspirin) .Marland Kitchen... Take one tablet by mouth daily  Orders: EKG w/ Interpretation (93000) TLB-BMP (Basic Metabolic Panel-BMET) (80048-METABOL) TLB-BNP (B-Natriuretic Peptide) (83880-BNPR)  Problem # 2:  ATRIAL FIBRILLATION (ICD-427.31) No evidence of recurrent AFib. She does not tolerate AF well and is at risk for acute pulmonary edema if this recurs.  In reviewing her med list I see that she is not on warfarin. Med administriation is very complicated with Ms Truman as she is either unable or unwilling to take many medications. I will review warfarin with her when she returns in one week because her CHADS score is 2 and risk:benefit favors anticoagulation.  Her updated medication list for this problem includes:    Bystolic 10 Mg Tabs (Nebivolol hcl) .Marland Kitchen... Take 1 tablet by mouth once a day    Aspirin 81 Mg Tbec (Aspirin) .Marland Kitchen... Take one tablet by mouth daily  Orders: EKG w/ Interpretation (93000) TLB-BMP (Basic Metabolic Panel-BMET) (80048-METABOL) TLB-BNP (B-Natriuretic Peptide) (83880-BNPR)  Patient Instructions: 1)  Your physician recommends that you schedule a follow-up appointment in: 1 WEEK 2)  Your physician recommends that you have lab work today: BMP and BNP 3)  Your physician has recommended you make the following change in your medication: INCREASE  Furosemide to 40mg  take 3 tablets by mouth two times a day  Prescriptions: FUROSEMIDE 40 MG TABS (FUROSEMIDE) take 3 tablets by mouth two times a day  #540 x 3   Entered by:   Julieta Gutting, RN, BSN   Authorized by:   Norva Karvonen, MD   Signed by:   Julieta Gutting, RN, BSN on 10/11/2009   Method used:   Electronically to        [facility] [address] (retail)       [address] [place], [place]  [address]       Ph: [phone]       Fax: [phone]   RxID:   [id number]

## 2010-04-14 NOTE — Letter (Signed)
Summary: Letter  Letter   Imported By: Florinda Marker 05/25/2009 15:13:43  _____________________________________________________________________  External Attachment:    Type:   Image     Comment:   External Document

## 2010-04-14 NOTE — Letter (Signed)
Summary: Historic Patient File  Historic Patient File   Imported By: Margie Billet 08/27/2009 10:49:52  _____________________________________________________________________  External Attachment:    Type:   Image     Comment:   External Document

## 2010-04-14 NOTE — Miscellaneous (Signed)
Summary: Medication Change  Clinical Lists Changes  I spoke with the pharmacist at [facility] and called in Demadex.  Furosemide was discontinued.  Julieta Gutting, RN, BSN  October 13, 2009 5:39 PM   Medications: Removed medication of FUROSEMIDE 40 MG TABS (FUROSEMIDE) take 3 tablets by mouth two times a day - Signed Added new medication of DEMADEX 20 MG TABS (TORSEMIDE) take 2 tablets by mouth daily - Signed

## 2010-04-14 NOTE — Progress Notes (Signed)
Summary: Itching  Phone Note Call from Patient Call back at Home Phone [phone]   Caller: Patient [phone] Reason for Call: Talk to Nurse Summary of Call: returning called from lauren Initial call taken by: Lorne Skeens,  March 04, 2010 12:20 PM  Follow-up for Phone Call        pt returning lauren call pt need to talk to lauren before 4pm today Follow-up by: Roe Coombs,  March 04, 2010 12:57 PM  Additional Follow-up for Phone Call Additional follow up Details #1::        I spoke with the pt's husband about the pt having problems with itching.  The pt has been using Hydrocortisone cream and this has not been helping.   I also recommended that the pt try Aveeno lotion for dry skin.  The pt feels like one of her BP meds is causing the problem.  Per the pt's husband the SBP has been running 140-150.  I made him aware that the pt cannot stop her BP meds at this time.  I also recommended that the pt try taking Benadryl 25mg  at bedtime as needed for itching.  They will pick-up some Benadryl today.   Additional Follow-up by: Julieta Gutting, RN, BSN,  March 04, 2010 1:12 PM

## 2010-04-15 LAB — URINE CULTURE

## 2010-04-25 ENCOUNTER — Ambulatory Visit (INDEPENDENT_AMBULATORY_CARE_PROVIDER_SITE_OTHER): Payer: Self-pay | Admitting: Surgery

## 2010-04-25 DIAGNOSIS — N186 End stage renal disease: Secondary | ICD-10-CM

## 2010-04-26 NOTE — Assessment & Plan Note (Signed)
OFFICE VISIT  [name], [name] DOB:  1935-12-26                                       04/25/2010 [id number]#:[id number]  The patient comes back in today for followup of arm pain after dialysis. She had a left upper arm fistula placed by Dr. Hart Rochester in January of 2011.  At that time she was not yet on dialysis.  She was recently started in January.  She is complaining of severe pain in her hand and arm with dialysis.  She does not have pain at other times.  She has had a couple of episodes during dialysis where she did not have pain.  She also states that she does get dizzy with dialysis and on specific questioning she states her blood pressure gets very low during her dialysis.  PHYSICAL EXAMINATION:  Vital signs:  Her heart rate is 60, blood pressure 157/60, temperature 97.5.  General:  She is well-appearing, in no distress.  Lungs:  Respirations nonlabored.  Neurological:  She is intact.  Skin:  Without rash.  Cardiovascular:  She has an excellent thrill within her fistula.  I cannot palpate a radial pulse with the fistula open.  When I compress her fistula she has a palpable radial pulse.  There are no ulcerations in her hand.  ASSESSMENT:  Left arm steal with dialysis.  PLAN:  I discussed with the patient and her husband we have several options to try to manage this problem.  First I would begin with meticulous attention to access needle placement.  She has had several episodes where when accessed without complication she has dialyzed without pain.  In addition, she most likely is suffering her symptoms when she gets hypotensive.  I would recommend keeping her blood pressure as high as possible during dialysis.  I am going to give her 2-3 weeks to try this and see if she receives benefit.  If not, when she comes back I am going to get an ultrasound to evaluate her for steal syndrome and then we will need to consider surgical options.  These would  include a DRIL procedure versus banding versus fistula ligation with access at a new site.    Jorge Ny, MD Electronically Signed  VWB/MEDQ  D:  04/25/2010  T:  04/26/2010  Job:  [id number]  cc:   Duke Salvia. Eliott Nine, M.D.

## 2010-04-28 ENCOUNTER — Telehealth: Payer: Self-pay | Admitting: Cardiovascular Disease

## 2010-04-28 NOTE — Progress Notes (Signed)
Summary: [facility]: Pt Note  [facility]: Pt Note   Imported By: Earl Many 04/20/2010 17:22:22  _____________________________________________________________________  External Attachment:    Type:   Image     Comment:   External Document

## 2010-05-02 ENCOUNTER — Other Ambulatory Visit (HOSPITAL_COMMUNITY): Payer: Self-pay | Admitting: General Surgery

## 2010-05-02 DIAGNOSIS — K829 Disease of gallbladder, unspecified: Secondary | ICD-10-CM

## 2010-05-04 ENCOUNTER — Encounter: Payer: Self-pay | Admitting: Cardiovascular Disease

## 2010-05-04 ENCOUNTER — Other Ambulatory Visit: Payer: Self-pay

## 2010-05-04 DIAGNOSIS — I129 Hypertensive chronic kidney disease with stage 1 through stage 4 chronic kidney disease, or unspecified chronic kidney disease: Secondary | ICD-10-CM

## 2010-05-04 LAB — HEPATIC FUNCTION PANEL
ALT: 16 U/L (ref 0–35)
AST: 22 U/L (ref 0–37)
Alkaline Phosphatase: 70 U/L (ref 39–117)
Bilirubin, Direct: <0.1 mg/dL (ref 0.0–0.3)
Total Bilirubin: 0.4 mg/dL (ref 0.3–1.2)

## 2010-05-04 NOTE — Progress Notes (Signed)
Summary: pt has medication question/awaiting [facility] resp  Phone Note Call from Patient Call back at Home Phone [phone]   Caller: Patient Reason for Call: Talk to Nurse, Talk to Doctor Summary of Call: pt has medication question Initial call taken by: Omer Jack,  April 28, 2010 2:54 PM  Follow-up for Phone Call        her BP is coming down 110/77  She saw a doctor at dialysis and he wants her to stop Hydralazine.  They don't want to stop this with out Dr Earmon Phoenix consent  Her BP gets low at dialysis. has pain and gets cold when it drops low.  Spoke with the husband who is very difficult to understand over the phone.  The problem is during dialysis and the next day.  Please advise. Husband states pt feels ok and they can't tell me what the BP's are during dialysis only that they  are low. Sounds like 120/41 HR in 60's. Please advise  I told husband these pressures sounded ok but would ask Dr Antony Haste  Additional Follow-up for Phone Call Additional follow up Details #1::        agree stop hydralazine Additional Follow-up by: Norva Karvonen, MD,  April 29, 2010 6:14 AM    Additional Follow-up for Phone Call Additional follow up Details #2::    PT'S HUSBAND AWARE TO STOP HYDRALAZINE. Follow-up by: Scherrie Bateman, LPN,  April 29, 2010 8:02 AM

## 2010-05-06 ENCOUNTER — Telehealth (INDEPENDENT_AMBULATORY_CARE_PROVIDER_SITE_OTHER): Payer: Self-pay | Admitting: *Deleted

## 2010-05-10 NOTE — Progress Notes (Signed)
Summary: refill med  Phone Note Refill Request Call back at Home Phone [phone] Message from:  Patient on May 06, 2010 2:05 PM  Refills Requested: Medication #1:  BYSTOLIC 10 MG TABS Take 1 tablet by mouth once a day [facility].    Method Requested: Fax to Local Pharmacy Initial call taken by: Lorne Skeens,  May 06, 2010 2:05 PM Caller: Spouse Reason for Call: Talk to Nurse  Follow-up for Phone Call        Rx faxed to pharmac. Vikki Ports  May 06, 2010 4:22 PM      Prescriptions: BYSTOLIC 10 MG TABS (NEBIVOLOL HCL) Take 1 tablet by mouth once a day  #30 x 6   Entered by:   Vikki Ports   Authorized by:   Norva Karvonen, MD   Signed by:   Vikki Ports on 05/06/2010   Method used:   Faxed to ...       [phone][facility] (retail)       [address] [place], [place]  [address]       Ph: [phone]       Fax: [phone]   RxID:   [id number]

## 2010-05-16 ENCOUNTER — Ambulatory Visit (HOSPITAL_COMMUNITY)
Admission: RE | Admit: 2010-05-16 | Discharge: 2010-05-16 | Disposition: A | Discharge status: Home / Self Care | Payer: Medicaid Other | Source: Ambulatory Visit | Attending: General Surgery | Admitting: General Surgery

## 2010-05-16 ENCOUNTER — Ambulatory Visit: Payer: Self-pay | Admitting: Surgery

## 2010-05-16 ENCOUNTER — Other Ambulatory Visit (HOSPITAL_COMMUNITY): Payer: Self-pay | Admitting: General Surgery

## 2010-05-16 ENCOUNTER — Encounter (INDEPENDENT_AMBULATORY_CARE_PROVIDER_SITE_OTHER): Payer: Medicaid Other

## 2010-05-16 ENCOUNTER — Ambulatory Visit (INDEPENDENT_AMBULATORY_CARE_PROVIDER_SITE_OTHER): Payer: Medicaid Other | Admitting: Surgery

## 2010-05-16 DIAGNOSIS — R1011 Right upper quadrant pain: Secondary | ICD-10-CM | POA: Insufficient documentation

## 2010-05-16 DIAGNOSIS — K829 Disease of gallbladder, unspecified: Secondary | ICD-10-CM

## 2010-05-16 DIAGNOSIS — T82898A Other specified complication of vascular prosthetic devices, implants and grafts, initial encounter: Secondary | ICD-10-CM

## 2010-05-16 DIAGNOSIS — N186 End stage renal disease: Secondary | ICD-10-CM

## 2010-05-16 DIAGNOSIS — Z9889 Other specified postprocedural states: Secondary | ICD-10-CM | POA: Insufficient documentation

## 2010-05-16 MED ORDER — TECHNETIUM TC 99M MEBROFENIN IV KIT
4.8000 | PACK | Freq: Once | INTRAVENOUS | Status: AC | PRN
  Administered 2010-05-16: 5 via INTRAVENOUS

## 2010-05-17 NOTE — Assessment & Plan Note (Signed)
OFFICE VISIT  [name], [name] DOB:  Jun 11, 1935                                       05/16/2010 [id number]#:[id number]  The patient comes back today for followup.  She had a left upper arm fistula placed by Dr. Hart Rochester in July of 2011.  When I saw her in February she was complaining of her hand being cold which sounded like a mild steal syndrome.  It was only cold while she was connected to the dialysis machine.  I recommended increasing her blood pressure while she is on the machine.  Since that time she has not had any episodes where her hand has bothered her.  She does complain that there is poor flow on the venous return side, there is a question of stenosis.  PHYSICAL EXAMINATION:  Vital signs:  Heart rate 54, blood pressure 165/59, temperature 97.7.  General:  She is well-appearing, in no distress.  Lungs:  Respirations nonlabored.  There is an excellent thrill within her fistula which is somewhat dampened as you go up the arm.  Difficulty with venous return on her fistula.  PLAN:  I think since they are having problems with the venous return that we need to proceed with a fistulogram to see if there is any form of stenosis that may need to be addressed.  I have scheduled this for Wednesday March 14.    Jorge Ny, MD Electronically Signed  VWB/MEDQ  D:  05/16/2010  T:  05/17/2010  Job:  [id number]  cc:   Duke Salvia. Eliott Nine, M.D.

## 2010-05-23 LAB — IRON AND TIBC
Iron: 48 ug/dL (ref 42–135)
Saturation Ratios: 23 % (ref 20–55)
TIBC: 211 ug/dL — ABNORMAL LOW (ref 250–470)
UIBC: 163 ug/dL

## 2010-05-23 LAB — FERRITIN: Ferritin: 746 ng/mL — ABNORMAL HIGH (ref 10–291)

## 2010-05-24 ENCOUNTER — Ambulatory Visit: Payer: Self-pay | Admitting: Cardiovascular Disease

## 2010-05-25 ENCOUNTER — Ambulatory Visit (HOSPITAL_COMMUNITY)
Admission: RE | Admit: 2010-05-25 | Discharge: 2010-05-25 | Disposition: A | Discharge status: Home / Self Care | Payer: Medicaid Other | Source: Ambulatory Visit | Attending: Surgery | Admitting: Surgery

## 2010-05-25 DIAGNOSIS — Y832 Surgical operation with anastomosis, bypass or graft as the cause of abnormal reaction of the patient, or of later complication, without mention of misadventure at the time of the procedure: Secondary | ICD-10-CM | POA: Insufficient documentation

## 2010-05-25 DIAGNOSIS — T82598A Other mechanical complication of other cardiac and vascular devices and implants, initial encounter: Secondary | ICD-10-CM | POA: Insufficient documentation

## 2010-05-25 DIAGNOSIS — T82898A Other specified complication of vascular prosthetic devices, implants and grafts, initial encounter: Secondary | ICD-10-CM

## 2010-05-25 LAB — POCT I-STAT, CHEM 8
BUN: 37 mg/dL — ABNORMAL HIGH (ref 6–23)
Calcium, Ion: 1.09 mmol/L — ABNORMAL LOW (ref 1.12–1.32)
Chloride: 102 mEq/L (ref 96–112)
Creatinine, Ser: 4.1 mg/dL — ABNORMAL HIGH (ref 0.4–1.2)
Glucose, Bld: 94 mg/dL (ref 70–99)
TCO2: 29 mmol/L (ref 0–100)

## 2010-05-29 LAB — CARDIAC PANEL(CRET KIN+CKTOT+MB+TROPI)
CK, MB: 0.7 ng/mL (ref 0.3–4.0)
CK, MB: 1.8 ng/mL (ref 0.3–4.0)
CK, MB: 12.1 ng/mL (ref 0.3–4.0)
Relative Index: INVALID (ref 0.0–2.5)
Relative Index: INVALID (ref 0.0–2.5)
Relative Index: INVALID (ref 0.0–2.5)
Relative Index: 7.5 — ABNORMAL HIGH (ref 0.0–2.5)
Total CK: 11 U/L (ref 7–177)
Total CK: 13 U/L (ref 7–177)
Total CK: 16 U/L (ref 7–177)
Total CK: 161 U/L (ref 7–177)
Troponin I: 10.6 ng/mL (ref 0.00–0.06)

## 2010-05-29 LAB — CBC
HCT: 22.8 % — ABNORMAL LOW (ref 36.0–46.0)
HCT: 23.1 % — ABNORMAL LOW (ref 36.0–46.0)
HCT: 24.3 % — ABNORMAL LOW (ref 36.0–46.0)
HCT: 24.6 % — ABNORMAL LOW (ref 36.0–46.0)
HCT: 25.1 % — ABNORMAL LOW (ref 36.0–46.0)
HCT: 26.4 % — ABNORMAL LOW (ref 36.0–46.0)
HCT: 27.4 % — ABNORMAL LOW (ref 36.0–46.0)
HCT: 24.5 % — ABNORMAL LOW (ref 36.0–46.0)
HCT: 24.6 % — ABNORMAL LOW (ref 36.0–46.0)
HCT: 25.1 % — ABNORMAL LOW (ref 36.0–46.0)
Hemoglobin: 7.7 g/dL — ABNORMAL LOW (ref 12.0–15.0)
Hemoglobin: 7.8 g/dL — ABNORMAL LOW (ref 12.0–15.0)
Hemoglobin: 8.3 g/dL — ABNORMAL LOW (ref 12.0–15.0)
Hemoglobin: 8.5 g/dL — ABNORMAL LOW (ref 12.0–15.0)
Hemoglobin: 8.6 g/dL — ABNORMAL LOW (ref 12.0–15.0)
MCH: 29 pg (ref 26.0–34.0)
MCH: 29.1 pg (ref 26.0–34.0)
MCH: 29.3 pg (ref 26.0–34.0)
MCH: 29.4 pg (ref 26.0–34.0)
MCH: 29.5 pg (ref 26.0–34.0)
MCH: 29 pg (ref 26.0–34.0)
MCH: 29 pg (ref 26.0–34.0)
MCH: 29.2 pg (ref 26.0–34.0)
MCHC: 33.2 g/dL (ref 30.0–36.0)
MCHC: 33.4 g/dL (ref 30.0–36.0)
MCHC: 33.8 g/dL (ref 30.0–36.0)
MCHC: 33.6 g/dL (ref 30.0–36.0)
MCHC: 33.7 g/dL (ref 30.0–36.0)
MCHC: 33.8 g/dL (ref 30.0–36.0)
MCHC: 33.8 g/dL (ref 30.0–36.0)
MCV: 86.1 fL (ref 78.0–100.0)
MCV: 86.2 fL (ref 78.0–100.0)
MCV: 86.2 fL (ref 78.0–100.0)
MCV: 86.3 fL (ref 78.0–100.0)
MCV: 86.4 fL (ref 78.0–100.0)
MCV: 86.4 fL (ref 78.0–100.0)
MCV: 86.5 fL (ref 78.0–100.0)
MCV: 86.8 fL (ref 78.0–100.0)
MCV: 87.3 fL (ref 78.0–100.0)
MCV: 86 fL (ref 78.0–100.0)
MCV: 86.2 fL (ref 78.0–100.0)
MCV: 86.8 fL (ref 78.0–100.0)
MCV: 87 fL (ref 78.0–100.0)
Platelets: 206 10*3/uL (ref 150–400)
Platelets: 268 10*3/uL (ref 150–400)
Platelets: 277 10*3/uL (ref 150–400)
Platelets: 285 10*3/uL (ref 150–400)
Platelets: 124 10*3/uL — ABNORMAL LOW (ref 150–400)
Platelets: 131 10*3/uL — ABNORMAL LOW (ref 150–400)
Platelets: 145 10*3/uL — ABNORMAL LOW (ref 150–400)
Platelets: 157 10*3/uL (ref 150–400)
Platelets: 165 10*3/uL (ref 150–400)
RBC: 2.68 MIL/uL — ABNORMAL LOW (ref 3.87–5.11)
RBC: 2.74 MIL/uL — ABNORMAL LOW (ref 3.87–5.11)
RBC: 2.83 MIL/uL — ABNORMAL LOW (ref 3.87–5.11)
RBC: 3.06 MIL/uL — ABNORMAL LOW (ref 3.87–5.11)
RBC: 3.17 MIL/uL — ABNORMAL LOW (ref 3.87–5.11)
RBC: 2.95 MIL/uL — ABNORMAL LOW (ref 3.87–5.11)
RDW: 16.5 % — ABNORMAL HIGH (ref 11.5–15.5)
RDW: 16.6 % — ABNORMAL HIGH (ref 11.5–15.5)
RDW: 16.7 % — ABNORMAL HIGH (ref 11.5–15.5)
RDW: 16.7 % — ABNORMAL HIGH (ref 11.5–15.5)
RDW: 17 % — ABNORMAL HIGH (ref 11.5–15.5)
RDW: 17 % — ABNORMAL HIGH (ref 11.5–15.5)
RDW: 16.2 % — ABNORMAL HIGH (ref 11.5–15.5)
RDW: 16.5 % — ABNORMAL HIGH (ref 11.5–15.5)
RDW: 16.5 % — ABNORMAL HIGH (ref 11.5–15.5)
RDW: 16.6 % — ABNORMAL HIGH (ref 11.5–15.5)
WBC: 10.4 10*3/uL (ref 4.0–10.5)
WBC: 10.7 10*3/uL — ABNORMAL HIGH (ref 4.0–10.5)
WBC: 12.5 10*3/uL — ABNORMAL HIGH (ref 4.0–10.5)
WBC: 7.4 10*3/uL (ref 4.0–10.5)
WBC: 7.9 10*3/uL (ref 4.0–10.5)
WBC: 9.4 10*3/uL (ref 4.0–10.5)
WBC: 9.6 10*3/uL (ref 4.0–10.5)
WBC: 6 10*3/uL (ref 4.0–10.5)
WBC: 6 10*3/uL (ref 4.0–10.5)

## 2010-05-29 LAB — POCT CARDIAC MARKERS
CKMB, poc: 19.2 ng/mL (ref 1.0–8.0)
CKMB, poc: 28 ng/mL (ref 1.0–8.0)
Myoglobin, poc: >500 ng/mL (ref 12–200)
Myoglobin, poc: >500 ng/mL (ref 12–200)
Troponin i, poc: 7.52 ng/mL (ref 0.00–0.09)

## 2010-05-29 LAB — RENAL FUNCTION PANEL
Albumin: 2.3 g/dL — ABNORMAL LOW (ref 3.5–5.2)
Albumin: 2.7 g/dL — ABNORMAL LOW (ref 3.5–5.2)
BUN: 31 mg/dL — ABNORMAL HIGH (ref 6–23)
BUN: 42 mg/dL — ABNORMAL HIGH (ref 6–23)
BUN: 54 mg/dL — ABNORMAL HIGH (ref 6–23)
CO2: 18 mEq/L — ABNORMAL LOW (ref 19–32)
CO2: 27 mEq/L (ref 19–32)
CO2: 29 mEq/L (ref 19–32)
CO2: 30 mEq/L (ref 19–32)
CO2: 30 mEq/L (ref 19–32)
CO2: 23 mEq/L (ref 19–32)
Calcium: 8.6 mg/dL (ref 8.4–10.5)
Calcium: 8.7 mg/dL (ref 8.4–10.5)
Calcium: 8.7 mg/dL (ref 8.4–10.5)
Calcium: 8.9 mg/dL (ref 8.4–10.5)
Chloride: 103 mEq/L (ref 96–112)
Creatinine, Ser: 5.06 mg/dL — ABNORMAL HIGH (ref 0.4–1.2)
Creatinine, Ser: 5.38 mg/dL — ABNORMAL HIGH (ref 0.4–1.2)
GFR calc Af Amer: 10 mL/min — ABNORMAL LOW (ref >60)
GFR calc Af Amer: 13 mL/min — ABNORMAL LOW (ref >60)
GFR calc Af Amer: 9 mL/min — ABNORMAL LOW (ref >60)
GFR calc Af Amer: 16 mL/min — ABNORMAL LOW (ref >60)
GFR calc non Af Amer: 11 mL/min — ABNORMAL LOW (ref >60)
GFR calc non Af Amer: 8 mL/min — ABNORMAL LOW (ref >60)
GFR calc non Af Amer: 13 mL/min — ABNORMAL LOW (ref >60)
Glucose, Bld: 101 mg/dL — ABNORMAL HIGH (ref 70–99)
Glucose, Bld: 107 mg/dL — ABNORMAL HIGH (ref 70–99)
Glucose, Bld: 109 mg/dL — ABNORMAL HIGH (ref 70–99)
Glucose, Bld: 122 mg/dL — ABNORMAL HIGH (ref 70–99)
Glucose, Bld: 134 mg/dL — ABNORMAL HIGH (ref 70–99)
Glucose, Bld: 120 mg/dL — ABNORMAL HIGH (ref 70–99)
Phosphorus: 6 mg/dL — ABNORMAL HIGH (ref 2.3–4.6)
Phosphorus: 6.9 mg/dL — ABNORMAL HIGH (ref 2.3–4.6)
Phosphorus: 4.5 mg/dL (ref 2.3–4.6)
Potassium: 3.5 mEq/L (ref 3.5–5.1)
Potassium: 4 mEq/L (ref 3.5–5.1)
Potassium: 4.5 mEq/L (ref 3.5–5.1)
Potassium: 4.9 mEq/L (ref 3.5–5.1)
Sodium: 135 mEq/L (ref 135–145)
Sodium: 136 mEq/L (ref 135–145)
Sodium: 137 mEq/L (ref 135–145)

## 2010-05-29 LAB — BASIC METABOLIC PANEL
BUN: 35 mg/dL — ABNORMAL HIGH (ref 6–23)
BUN: 38 mg/dL — ABNORMAL HIGH (ref 6–23)
BUN: 40 mg/dL — ABNORMAL HIGH (ref 6–23)
BUN: 41 mg/dL — ABNORMAL HIGH (ref 6–23)
BUN: 55 mg/dL — ABNORMAL HIGH (ref 6–23)
BUN: 57 mg/dL — ABNORMAL HIGH (ref 6–23)
BUN: 30 mg/dL — ABNORMAL HIGH (ref 6–23)
CO2: 22 mEq/L (ref 19–32)
CO2: 27 mEq/L (ref 19–32)
CO2: 27 mEq/L (ref 19–32)
CO2: 28 mEq/L (ref 19–32)
CO2: 24 mEq/L (ref 19–32)
Calcium: 8.8 mg/dL (ref 8.4–10.5)
Calcium: 8.9 mg/dL (ref 8.4–10.5)
Calcium: 8.6 mg/dL (ref 8.4–10.5)
Chloride: 102 mEq/L (ref 96–112)
Chloride: 87 mEq/L — ABNORMAL LOW (ref 96–112)
Chloride: 94 mEq/L — ABNORMAL LOW (ref 96–112)
Chloride: 98 mEq/L (ref 96–112)
Creatinine, Ser: 3.63 mg/dL — ABNORMAL HIGH (ref 0.4–1.2)
Creatinine, Ser: 5.08 mg/dL — ABNORMAL HIGH (ref 0.4–1.2)
Creatinine, Ser: 5.46 mg/dL — ABNORMAL HIGH (ref 0.4–1.2)
Creatinine, Ser: 3.03 mg/dL — ABNORMAL HIGH (ref 0.4–1.2)
GFR calc Af Amer: 13 mL/min — ABNORMAL LOW (ref >60)
GFR calc Af Amer: 18 mL/min — ABNORMAL LOW (ref >60)
GFR calc non Af Amer: 11 mL/min — ABNORMAL LOW (ref >60)
GFR calc non Af Amer: 12 mL/min — ABNORMAL LOW (ref >60)
GFR calc non Af Amer: 13 mL/min — ABNORMAL LOW (ref >60)
GFR calc non Af Amer: 8 mL/min — ABNORMAL LOW (ref >60)
GFR calc non Af Amer: 15 mL/min — ABNORMAL LOW (ref >60)
GFR calc non Af Amer: 15 mL/min — ABNORMAL LOW (ref >60)
Glucose, Bld: 104 mg/dL — ABNORMAL HIGH (ref 70–99)
Glucose, Bld: 107 mg/dL — ABNORMAL HIGH (ref 70–99)
Glucose, Bld: 120 mg/dL — ABNORMAL HIGH (ref 70–99)
Glucose, Bld: 172 mg/dL — ABNORMAL HIGH (ref 70–99)
Glucose, Bld: 99 mg/dL (ref 70–99)
Potassium: 3.2 mEq/L — ABNORMAL LOW (ref 3.5–5.1)
Potassium: 3.4 mEq/L — ABNORMAL LOW (ref 3.5–5.1)
Potassium: 3.4 mEq/L — ABNORMAL LOW (ref 3.5–5.1)
Potassium: 3.9 mEq/L (ref 3.5–5.1)
Potassium: 4 mEq/L (ref 3.5–5.1)
Potassium: 3.9 mEq/L (ref 3.5–5.1)
Sodium: 134 mEq/L — ABNORMAL LOW (ref 135–145)
Sodium: 135 mEq/L (ref 135–145)
Sodium: 136 mEq/L (ref 135–145)
Sodium: 142 mEq/L (ref 135–145)

## 2010-05-29 LAB — COMPREHENSIVE METABOLIC PANEL
Albumin: 3.5 g/dL (ref 3.5–5.2)
BUN: 29 mg/dL — ABNORMAL HIGH (ref 6–23)
BUN: 31 mg/dL — ABNORMAL HIGH (ref 6–23)
CO2: 22 mEq/L (ref 19–32)
Calcium: 8.9 mg/dL (ref 8.4–10.5)
Chloride: 110 mEq/L (ref 96–112)
Creatinine, Ser: 3.2 mg/dL — ABNORMAL HIGH (ref 0.4–1.2)
Creatinine, Ser: 3.26 mg/dL — ABNORMAL HIGH (ref 0.4–1.2)
GFR calc non Af Amer: 14 mL/min — ABNORMAL LOW (ref >60)
Potassium: 4 mEq/L (ref 3.5–5.1)
Total Bilirubin: 0.4 mg/dL (ref 0.3–1.2)
Total Protein: 7.2 g/dL (ref 6.0–8.3)

## 2010-05-29 LAB — URINE CULTURE
Colony Count: NO GROWTH
Culture: NO GROWTH

## 2010-05-29 LAB — DIFFERENTIAL
Lymphocytes Relative: 14 % (ref 12–46)
Lymphs Abs: 0.8 10*3/uL (ref 0.7–4.0)
Monocytes Absolute: 0.4 10*3/uL (ref 0.1–1.0)
Monocytes Relative: 6 % (ref 3–12)
Neutro Abs: 4.7 10*3/uL (ref 1.7–7.7)

## 2010-05-29 LAB — HEPARIN LEVEL (UNFRACTIONATED)
Heparin Unfractionated: <0.1 IU/mL — ABNORMAL LOW (ref 0.30–0.70)
Heparin Unfractionated: 0.25 IU/mL — ABNORMAL LOW (ref 0.30–0.70)
Heparin Unfractionated: 0.25 IU/mL — ABNORMAL LOW (ref 0.30–0.70)
Heparin Unfractionated: 0.44 IU/mL (ref 0.30–0.70)

## 2010-05-29 LAB — PROTEIN, URINE, 24 HOUR
Protein, 24H Urine: 3648 mg/d — ABNORMAL HIGH (ref 50–100)
Protein, Urine: 304 mg/dL
Urine Total Volume-UPROT: 1200 mL

## 2010-05-29 LAB — ANA: Anti Nuclear Antibody(ANA): POSITIVE — AB

## 2010-05-29 LAB — URINALYSIS, ROUTINE W REFLEX MICROSCOPIC
Ketones, ur: NEGATIVE mg/dL
Protein, ur: >300 mg/dL — AB
Urobilinogen, UA: 0.2 mg/dL (ref 0.0–1.0)

## 2010-05-29 LAB — FOLATE: Folate: >20 ng/mL

## 2010-05-29 LAB — TYPE AND SCREEN

## 2010-05-29 LAB — RETICULOCYTES
RBC.: 2.95 MIL/uL — ABNORMAL LOW (ref 3.87–5.11)
Retic Ct Pct: 4.7 % — ABNORMAL HIGH (ref 0.4–3.1)

## 2010-05-29 LAB — URINE MICROSCOPIC-ADD ON

## 2010-05-29 LAB — IRON AND TIBC
Iron: 19 ug/dL — ABNORMAL LOW (ref 42–135)
Saturation Ratios: 52 % (ref 20–55)
Saturation Ratios: 9 % — ABNORMAL LOW (ref 20–55)
TIBC: 212 ug/dL — ABNORMAL LOW (ref 250–470)

## 2010-05-29 LAB — PTH, INTACT AND CALCIUM: PTH: 214.6 pg/mL — ABNORMAL HIGH (ref 14.0–72.0)

## 2010-05-29 LAB — GLUCOSE, CAPILLARY: Glucose-Capillary: 132 mg/dL — ABNORMAL HIGH (ref 70–99)

## 2010-05-29 LAB — BRAIN NATRIURETIC PEPTIDE: Pro B Natriuretic peptide (BNP): 1752 pg/mL — ABNORMAL HIGH (ref 0.0–100.0)

## 2010-05-29 LAB — MAGNESIUM
Magnesium: 2.1 mg/dL (ref 1.5–2.5)
Magnesium: 2.2 mg/dL (ref 1.5–2.5)

## 2010-05-29 LAB — HEPATITIS B SURFACE ANTIBODY,QUALITATIVE: Hep B S Ab: NEGATIVE

## 2010-05-29 LAB — CK TOTAL AND CKMB (NOT AT ARMC): Relative Index: 10.6 — ABNORMAL HIGH (ref 0.0–2.5)

## 2010-05-29 LAB — MRSA PCR SCREENING: MRSA by PCR: NEGATIVE

## 2010-06-07 ENCOUNTER — Inpatient Hospital Stay (HOSPITAL_COMMUNITY)
Admission: EM | Admit: 2010-06-07 | Discharge: 2010-06-08 | DRG: 308 | Disposition: A | Discharge status: Home / Self Care | Payer: Medicaid Other | Attending: Cardiology | Admitting: Cardiology

## 2010-06-07 ENCOUNTER — Emergency Department (HOSPITAL_COMMUNITY): Payer: Medicaid Other

## 2010-06-07 DIAGNOSIS — D631 Anemia in chronic kidney disease: Secondary | ICD-10-CM | POA: Diagnosis present

## 2010-06-07 DIAGNOSIS — R079 Chest pain, unspecified: Secondary | ICD-10-CM

## 2010-06-07 DIAGNOSIS — I252 Old myocardial infarction: Secondary | ICD-10-CM

## 2010-06-07 DIAGNOSIS — N186 End stage renal disease: Secondary | ICD-10-CM | POA: Diagnosis present

## 2010-06-07 DIAGNOSIS — I509 Heart failure, unspecified: Secondary | ICD-10-CM | POA: Diagnosis present

## 2010-06-07 DIAGNOSIS — N039 Chronic nephritic syndrome with unspecified morphologic changes: Secondary | ICD-10-CM | POA: Diagnosis present

## 2010-06-07 DIAGNOSIS — Z7982 Long term (current) use of aspirin: Secondary | ICD-10-CM

## 2010-06-07 DIAGNOSIS — I120 Hypertensive chronic kidney disease with stage 5 chronic kidney disease or end stage renal disease: Secondary | ICD-10-CM | POA: Diagnosis present

## 2010-06-07 DIAGNOSIS — I4891 Unspecified atrial fibrillation: Principal | ICD-10-CM | POA: Diagnosis present

## 2010-06-07 DIAGNOSIS — I5032 Chronic diastolic (congestive) heart failure: Secondary | ICD-10-CM | POA: Diagnosis present

## 2010-06-07 LAB — CBC
HCT: 32.3 % — ABNORMAL LOW (ref 36.0–46.0)
Platelets: 208 10*3/uL (ref 150–400)
RDW: 17.3 % — ABNORMAL HIGH (ref 11.5–15.5)
WBC: 7.1 10*3/uL (ref 4.0–10.5)

## 2010-06-07 LAB — CK TOTAL AND CKMB (NOT AT ARMC)
CK, MB: 1.3 ng/mL (ref 0.3–4.0)
Relative Index: INVALID (ref 0.0–2.5)
Total CK: 27 U/L (ref 7–177)

## 2010-06-07 LAB — BASIC METABOLIC PANEL
Calcium: 9.3 mg/dL (ref 8.4–10.5)
GFR calc Af Amer: 23 mL/min — ABNORMAL LOW (ref >60)
GFR calc non Af Amer: 19 mL/min — ABNORMAL LOW (ref >60)
Glucose, Bld: 92 mg/dL (ref 70–99)
Potassium: 3.6 mEq/L (ref 3.5–5.1)
Sodium: 138 mEq/L (ref 135–145)

## 2010-06-07 LAB — TROPONIN I: Troponin I: 0.03 ng/mL (ref 0.00–0.06)

## 2010-06-08 DIAGNOSIS — I059 Rheumatic mitral valve disease, unspecified: Secondary | ICD-10-CM

## 2010-06-08 LAB — BASIC METABOLIC PANEL
BUN: 21 mg/dL (ref 6–23)
Chloride: 101 mEq/L (ref 96–112)
Glucose, Bld: 95 mg/dL (ref 70–99)
Potassium: 4.1 mEq/L (ref 3.5–5.1)
Sodium: 140 mEq/L (ref 135–145)

## 2010-06-08 LAB — CBC
HCT: 29.8 % — ABNORMAL LOW (ref 36.0–46.0)
Hemoglobin: 9.3 g/dL — ABNORMAL LOW (ref 12.0–15.0)
MCH: 28.4 pg (ref 26.0–34.0)
MCV: 91.1 fL (ref 78.0–100.0)
RBC: 3.27 MIL/uL — ABNORMAL LOW (ref 3.87–5.11)
WBC: 5.6 10*3/uL (ref 4.0–10.5)

## 2010-06-08 LAB — LIPID PANEL
HDL: 41 mg/dL (ref >39)
Total CHOL/HDL Ratio: 4 RATIO
Triglycerides: 98 mg/dL (ref <150)

## 2010-06-08 LAB — CARDIAC PANEL(CRET KIN+CKTOT+MB+TROPI)
Relative Index: INVALID (ref 0.0–2.5)
Troponin I: 0.05 ng/mL (ref 0.00–0.06)

## 2010-06-08 LAB — HEPARIN LEVEL (UNFRACTIONATED): Heparin Unfractionated: 0.28 IU/mL — ABNORMAL LOW (ref 0.30–0.70)

## 2010-06-08 NOTE — H&P (Addendum)
[name], BENSCH NO.:  [id number]  MEDICAL RECORD NO.:  [id number]           PATIENT TYPE:  I  LOCATION:  2020                         FACILITY:  [facility]  PHYSICIAN:  Rollene Rotunda, MD, FACCDATE OF BIRTH:  1935/08/06  DATE OF ADMISSION:  06/07/2010 DATE OF DISCHARGE:                             HISTORY & PHYSICAL   PRIMARY CARDIOLOGIST:  Veverly Fells. Excell Seltzer, MD  PRIMARY CARE DOCTOR:  Dr. Aundria Rud  NEPHROLOGIST:  Wilber Bihari. Caryn Section, MD  CHIEF COMPLAINT:  "My heart is racing, and I have chest pain."  REASON FOR ADMISSION:  Atrial fibrillation with RVR and chest pain.  HISTORY OF PRESENT ILLNESS:  [name] is a pleasant 75 year old female with a history of PAF, hypertension, end-stage renal disease, diastolic heart failure, possible CAD with an NSTEMI in July 2011.  At that time, she had not yet progressed to dialysis, and in lieu of preserving her renal function, cardiac catheterization was deferred and a Myoview was done which showed no evidence of ischemia.  She has had two episodes of atrial fibrillation in the last month.  The first was on May 24, 2010, during dialysis, as noted in the records that were sent along from [facility], with rapid ventricular response. The patient refused to go to the hospital at that time.  She was 5 minutes from the end of dialysis today when she noted some elevated heart rate and atrial fibrillation with RVR was noted on telemetry.  She also complained of a chest pain, like a pressure sensation in the middle of her chest substernally without radiation.  She is not having any associated nausea, vomiting, shortness of breath, or diaphoresis.  She does not get chest pain in the absence of the palpitations, but every time heart rate increases, she does feel the discomfort.  In the ER, she was started on metoprolol 5 mg IV x2, Cardizem bolus, and then a drip at 10 mg/hour.  Upon initial assessment, her heart  rate was still in the 100s, but upon secondary assessment with Dr. Antoine Poche, she converted to sinus bradycardia with heart rates in the mid 50s.  She currently feels comfortable without chest pain or shortness of breath.  First set of cardiac enzymes are pending.  In the past, the patient has refused Coumadin, but is not now open to the idea.  The only stipulation is that she and her husband want to make sure that all of her physicians in her care, Dr. Excell Seltzer, Dr. Caryn Section, and her PCP, are aware of this decision and in agreement.  PAST MEDICAL HISTORY: 1. PAF, has refused Coumadin in the past, see above. 2. Hypertension. 3. End-stage renal disease with dialysis on Tuesday, Thursday, and     Saturday. 4. NSTEMI with a peak troponin of 17 in July 2011 with a negative     Myoview at that time.  Cardiac catheterization was deferred because     of her renal function, she was not yet on dialysis. 5. Diastolic heart failure with EF of 70% by nuclear in July 2011.     Last echo in 2005, EF was normal. 6.  Anemia. 7. Status post attempted cholecystectomy, resulting in cholecystostomy tube     in December 2010.  MEDICATIONS:  The patient did not bring list, but thinks she is taking some sort of calcium supplement.  We will ask pharmacy to clarify. Otherwise, she is taking aspirin 81 mg every other day, Zantac 150 mg daily, and Bystolic 10 mg daily.  ALLERGIES:  CODEINE, INFED, and VENOFER.  There is some report that she has had an intolerance to PLAVIX in the past, although the patient refuses any knowledge of this.  SOCIAL HISTORY:  The patient is originally from [place].  She lives in [place] with her husband.  They have no children.  She denies any tobacco, alcohol, or drug use.  She takes no over-the-counter herbal supplements.  FAMILY HISTORY:  Positive for an MI in her mother who died at 65. Positive for CVA in her father who died at 97.  She has one brother who had an MI in his  late 92s, and passed away at age 2.  REVIEW OF SYSTEMS:  No fevers, chills, sweats, cough, syncope.  No bright red blood per rectum, hematuria, melena, or hematemesis.  All other systems are reviewed and otherwise, negative except for those noted in HPI.  LABORATORY DATA:  WBC 7.1, hemoglobin 10.2, hematocrit 32.3, platelet count 208.  Sodium 138, potassium 3.6, chloride 97, CO2 is 31, glucose 92, BUN 8, creatinine 2.44.  Cardiac enzymes are pending.  EKG:  Once in sinus bradycardia, the patient's EKG shows sinus rhythm with rate of 54 beats per minute with a PVC, nonspecific ST changes.  RADIOLOGY:  Chest x-ray showed cardiomegaly with minimal pulmonary vascular congestion.  PHYSICAL EXAMINATION:  VITAL SIGNS:  Temperature is 97, pulse 106, respirations 18, blood pressure 130/69, pulse ox 99% on room air. GENERAL:  This is a very pleasant white female, in no acute distress. HEENT:  Normocephalic, atraumatic.  Extraocular movements intact.  Clear sclerae.  Nares are without discharge. NECK:  Supple with slightly elevated JVD. CARDIAC:  Auscultation of the heart reveals irregularly tachycardic rhythm with 3/6 systolic ejection murmur, heard across the entire precordium. LUNGS:  Decreased breath sounds bilaterally but were, otherwise, clear without wheezes, rales, or rhonchi.  Breathing is unlabored. ABDOMEN:  Soft, nontender, nondistended with positive bowel sounds. EXTREMITIES:  Warm and dry and without edema.  She has 2+ pedal pulses on the left and 1+ pedal pulse on the right. NEUROLOGIC:  She is alert and oriented x3, responds to questions appropriately with a normal affect.  ASSESSMENT/PLAN:  The patient was seen and examined by Dr. Antoine Poche and myself.  This is a 75 year old female with history of paroxysmal atrial fibrillation, non-ST-segment elevation myocardial infarction with a troponin of 17 in July 2011 with a negative Myoview, no cath at that time, and end-stage  renal disease as well as diastolic heart failure, and hypertension who presents with chest pain in the setting of atrial fibrillation with rapid ventricular response.  She appears to have chest pain every time that she goes into this rhythm, and most recently had it on May 24, 2010, when she refused to go to the ER.  Cardiac enzymes are pending at this time.  The patient converted to sinus rhythm in the ER while we were interviewing her.  There were no acute ST-T wave changes.  Previously, in the past she did not want Coumadin, but now is open to the idea as long as she can have a discussion with Dr. Excell Seltzer and  her other physicians about it.  Given her age, hypertension, and diastolic heart failure, and possible atherosclerotic disease, she would require anticoagulation.  We will heparinize her.  Rate control may be difficult for atrial fibrillation given her sinus bradycardia low.  She also has blood pressures that range from the 110s to 120s with dialysis, and amiodarone may be a consideration.  If she rules in for myocardial infarction, she may need a cardiac catheterization.  We will cycle cardiac enzymes and determine further treatment based on lab work and her clinical status.  We will also check a fasting lipid panel to guide possible statin management.  For her presumed coronary artery disease, she will be continued on her aspirin and Bystolic for now.  Please note - her renal status was stable upon initial consult, and therefore we held off on calling Dr. Caryn Section. [facility] will need to be made aware of her hospitalization the following day for provision of dialysis upcoming on Thursday.    Ronie Spies, P.A.C.   ______________________________ Rollene Rotunda, MD, [facility]    DD/MEDQ  D:  06/07/2010  T:  06/08/2010  Job:  [id number]  cc:   Wilber Bihari. Caryn Section, M.D. Veverly Fells. Excell Seltzer, MD Dr. Aundria Rud  Electronically Signed by Ronie Spies  on 06/08/2010 07:19:06  AM Electronically Signed by Rollene Rotunda MD [facility] on 07/08/2010 11:44:00 AM

## 2010-06-12 NOTE — Op Note (Signed)
  [name], [name]             ACCOUNT NO.:  [id number]  MEDICAL RECORD NO.:  [id number]           PATIENT TYPE:  O  LOCATION:  [facility]                         FACILITY:  [facility]  PHYSICIAN:  Juleen China IV, MDDATE OF BIRTH:  [birth year]  DATE OF PROCEDURE:  05/25/2010 DATE OF DISCHARGE:  05/25/2010                              OPERATIVE REPORT   PREOPERATIVE DIAGNOSIS:  Non-maturing left upper arm arteriovenous fistula.  POSTOPERATIVE DIAGNOSIS:  Non-maturing left upper arm arteriovenous fistula.  PROCEDURES PERFORMED: 1. Ultrasound access, left arm fistula. 2. Fistulogram. 3. Percutaneous transluminal angioplasty, cephalic vein. 4. Followup x1.  PROCEDURE:  The patient was identified in the holding area and taken to room #8, placed supine on the table.  The left arm was prepped and draped in the usual fashion.  A time-out was called.  Ultrasound was used to evaluate the left upper arm cephalic vein fistula and was widely patent, easily compressible.  The fistula was then accessed under ultrasound guidance with a micropuncture needle.  A digital ultrasound image was obtained.  A 0.018 Bentson wire was then advanced without resistance and micropuncture sheath was placed.  Contrast injections were then performed to evaluate the fistula as well as the central venous system.  Upon intervention, reflux was performed to evaluate the arterial venous stenosis.  FINDINGS:  The cephalic vein fistula is widely patent in left forearm. Where the cephalic vein joins the subclavian vein, there was an area of luminal narrowing of approximately 60%.  __________ was widely patent.  At this point in time, the decision was made to intervene.  Over a Bentson wire, the micropuncture sheath was exchanged out for a 6-French short sheath.  The patient was given 3000 units of heparin.  I then performed primary balloon angioplasty using a 6 x 4 Power balloon and the proximal cephalic vein, the  balloon was taken through pressure and held out for 1 minute.  Followup study revealed improved suboptimal results.  At this point, I evaluated the arteriovenous anastomosis which was widely patent.  I then elected to upsize the balloon to an 8 x 4 Durata balloon.  Venoplasty was then performed in the same location and taking the balloon to nominal pressure.  Followup study was then performed which showed near resolution of the stenosis within the proximal cephalic vein.  I was very satisfied with these results.  I elected to terminate the procedure.  Catheters and wires were removed. The patient was taken to holding area for sheath pull.  IMPRESSION: 1. Stenosis within the proximal cephalic vein, successfully dilated     sequentially using a 6 x 4 and an 8 x 4     balloon. 2. No evidence of central venous stenosis. 3. Widely patent arteriovenous anastomosis.     Jorge Ny, MD     VWB/MEDQ  D:  05/26/2010  T:  05/27/2010  Job:  [id number]  Electronically Signed by Arelia Longest IV MD on 06/12/2010 78:46:96 PM

## 2010-06-13 LAB — CROSSMATCH
ABO/RH(D): A NEG
Antibody Screen: NEGATIVE

## 2010-06-13 LAB — CBC
HCT: 27.3 % — ABNORMAL LOW (ref 36.0–46.0)
Hemoglobin: 9.1 g/dL — ABNORMAL LOW (ref 12.0–15.0)
MCV: 84.5 fL (ref 78.0–100.0)
Platelets: 243 10*3/uL (ref 150–400)
RDW: 15 % (ref 11.5–15.5)
WBC: 6 10*3/uL (ref 4.0–10.5)

## 2010-06-13 LAB — COMPREHENSIVE METABOLIC PANEL
Albumin: 3.5 g/dL (ref 3.5–5.2)
Alkaline Phosphatase: 86 U/L (ref 39–117)
BUN: 21 mg/dL (ref 6–23)
Chloride: 106 mEq/L (ref 96–112)
Creatinine, Ser: 2.02 mg/dL — ABNORMAL HIGH (ref 0.4–1.2)
Glucose, Bld: 115 mg/dL — ABNORMAL HIGH (ref 70–99)
Potassium: 4.4 mEq/L (ref 3.5–5.1)
Total Bilirubin: 0.5 mg/dL (ref 0.3–1.2)

## 2010-06-13 LAB — ABO/RH: ABO/RH(D): A NEG

## 2010-06-15 ENCOUNTER — Telehealth: Payer: Self-pay | Admitting: Cardiovascular Disease

## 2010-06-15 NOTE — Telephone Encounter (Signed)
Left message for pt's spouse to call back.

## 2010-06-15 NOTE — Telephone Encounter (Signed)
I reviewed the patient's medications and I'm not sure whether she is taking bisoprolol regularly. If she is not, I would recommend taking metoprolol 25 mg twice daily as a scheduled medication with continued use of as needed metoprolol for breakthrough palpitations.

## 2010-06-15 NOTE — Telephone Encounter (Signed)
I spoke with the pt's husband and he said the pt had a fast heart beat this morning around 3 AM.  The pt took one of the PRN Metoprolol tartrate 25mg  and her pulse calmed down.  The pt then developed a fast heart beat around 10 this AM.  Pulse 147 and BP was "high".  The pt then took another Metoprolol tartrate and this helped. The pt's pulse has been normal since earlier this morning. I will speak with Dr Excell Seltzer for further recommendations about what the pt can do to help control fast pulse.

## 2010-06-16 ENCOUNTER — Encounter: Payer: Self-pay | Admitting: Cardiovascular Disease

## 2010-06-23 ENCOUNTER — Encounter: Payer: Self-pay | Admitting: Cardiovascular Disease

## 2010-06-24 NOTE — Telephone Encounter (Signed)
The pt's husband did not call the office back in regards to patient.  The pt is scheduled to see Dr Excell Seltzer on 06/29/10.

## 2010-06-27 ENCOUNTER — Ambulatory Visit: Payer: Self-pay | Admitting: Cardiology

## 2010-06-27 DIAGNOSIS — I4891 Unspecified atrial fibrillation: Secondary | ICD-10-CM

## 2010-06-27 DIAGNOSIS — Z7901 Long term (current) use of anticoagulants: Secondary | ICD-10-CM | POA: Insufficient documentation

## 2010-06-28 ENCOUNTER — Encounter: Payer: Self-pay | Admitting: Cardiovascular Disease

## 2010-06-29 ENCOUNTER — Ambulatory Visit (INDEPENDENT_AMBULATORY_CARE_PROVIDER_SITE_OTHER): Payer: Medicaid Other | Admitting: Cardiovascular Disease

## 2010-06-29 ENCOUNTER — Encounter: Payer: Self-pay | Admitting: Cardiovascular Disease

## 2010-06-29 VITALS — BP 162/58 | HR 64 | Ht 65.0 in | Wt 114.0 lb

## 2010-06-29 DIAGNOSIS — I5033 Acute on chronic diastolic (congestive) heart failure: Secondary | ICD-10-CM

## 2010-06-29 DIAGNOSIS — I4891 Unspecified atrial fibrillation: Secondary | ICD-10-CM

## 2010-06-29 DIAGNOSIS — I129 Hypertensive chronic kidney disease with stage 1 through stage 4 chronic kidney disease, or unspecified chronic kidney disease: Secondary | ICD-10-CM

## 2010-06-29 NOTE — Patient Instructions (Addendum)
Pt has appt with Dr Excell Seltzer on 06/29/10, will discuss Coumadin checks and dosing.  Difficult to do by phone given language barrier.    4/18- Discussed Coumadin with pt during OV with Dr. Excell Seltzer.  They are willing to come to office for visits until INR is stable.

## 2010-06-29 NOTE — Patient Instructions (Signed)
Your physician has recommended you make the following change in your medication: STOP Aspirin, STOP Bystolic, START Metoprolol Tartrate 25mg  take one-half tablet by mouth twice a day except on dialysis days,  USE Metoprolol Tartrate 25mg  tablet as needed for heart rate greater than 110, if heart rate remains elevated one hour after taking Metoprolol Tartrate please proceed to local EMERGENCY ROOM  Your physician recommends that you schedule a follow-up appointment in: 2 MONTHS

## 2010-07-01 ENCOUNTER — Encounter: Payer: Self-pay | Admitting: Cardiovascular Disease

## 2010-07-01 NOTE — Assessment & Plan Note (Signed)
BP elevated today but low BP's noted with dialysis. Beta-blocker changes as above.

## 2010-07-01 NOTE — Progress Notes (Signed)
HPI:  This is a 75 year-old presenting for followup evaluation. She has a history of malignant hypertension, ESRD, paroxysmal AF, and NSTEMI evaluated with a Myoview stress showing no significant ischemia. The patient has multiple medication intolerances and has been unable to take almost anything because of a variety of reactions. Her BP has come down dramatically since starting dialysis. At present she takes only Bystolic and is only taking this on Monday mornings. She dialyzes Tues/Thur/Sat and states BP is not high enough to take this any other days. She notes her BP drops to 80 mmHg frequently with dialysis.  She notes rare episodes of tachypalpitations and these are poorly tolerated. She describes palps, shortness of breath, and weakness. An episode occurred 2 weeks ago and lasted several hours. She took a metoprolol but this didn't take effect quickly. She has had no palpitation since then. Otherwise no complaints.  Outpatient Encounter Prescriptions as of 06/29/2010  Medication Sig Dispense Refill  . Calcium Acetate 667 MG TABS Take by mouth 3 (three) times daily before meals.        . metoprolol tartrate (LOPRESSOR) 25 MG tablet Take one-half tablet by mouth twice a day except on dialysis days,  Use 25mg  tablet as needed for heart rate greater than 110       . nitroGLYCERIN (NITROSTAT) 0.4 MG SL tablet Place 0.4 mg under the tongue every 5 (five) minutes as needed.        . ranitidine (ZANTAC) 150 MG tablet Take 150 mg by mouth daily.        Marland Kitchen warfarin (COUMADIN) 2.5 MG tablet 2.5 mg. Take as directed       . DISCONTD: aspirin 81 MG tablet Take 81 mg by mouth daily.        Marland Kitchen DISCONTD: metoprolol succinate (TOPROL-XL) 25 MG 24 hr tablet 25 mg. Take one tablet every 12 hours as needed       . DISCONTD: nebivolol (BYSTOLIC) 10 MG tablet Take 10 mg by mouth daily.        Marland Kitchen DISCONTD: calcitRIOL (ROCALTROL) 0.25 MCG capsule Take 0.25 mcg by mouth daily.        Marland Kitchen DISCONTD: ferrous sulfate 325 (65 FE)  MG tablet Take 325 mg by mouth daily with breakfast.        . DISCONTD: hydrALAZINE (APRESOLINE) 25 MG tablet Take 25 mg by mouth 3 (three) times daily.        Marland Kitchen DISCONTD: metolazone (ZAROXOLYN) 10 MG tablet Take 30 mins prior to Torsemide dose       . DISCONTD: torsemide (DEMADEX) 20 MG tablet Take 20 mg by mouth daily.          Allergies  Allergen Reactions  . Codeine     Past Medical History  Diagnosis Date  . ESRD (end stage renal disease)   . HTN (hypertension)   . Renal insufficiency   . HLD (hyperlipidemia)   . Cholelithiases   . Anxiety   . Anemia   . PAF (paroxysmal atrial fibrillation)   . Non-ST elevation MI (NSTEMI)     ROS: Negative except as per HPI  BP 162/58  Pulse 64  Ht 5\' 5"  (1.651 m)  Wt 114 lb (51.71 kg)  BMI 18.97 kg/m2  PHYSICAL EXAM: Pt is alert and oriented, elderly woman in NAD HEENT: normal Neck: JVP - normal, carotids 2+= without bruits Lungs: CTA bilaterally CV: RRR without murmur or gallop Abd: soft, NT, Positive BS, no hepatomegaly Ext: trace pretibial edema,  distal pulses intact and equal Skin: warm/dry no rash  EKG: Sinus bradycardia - otherwise within normal limits.  ASSESSMENT AND PLAN:

## 2010-07-01 NOTE — Assessment & Plan Note (Signed)
Volume now managed with dialysis. Continue current Rx.

## 2010-07-01 NOTE — Assessment & Plan Note (Signed)
Pt with symptomatic PAF. Recommend discontinuation of bystolic. Start metoprolol tartrate 12.5 mg twice daily on non-dialysis days and continue prn use of metoprolol 25 mg if heart rate greater than 110 bpm. The patient has started warfarin as her CHADS2 score = 3. She will discontinue aspirin.

## 2010-07-04 ENCOUNTER — Encounter: Payer: Self-pay | Admitting: Internal Medicine

## 2010-07-06 ENCOUNTER — Other Ambulatory Visit (INDEPENDENT_AMBULATORY_CARE_PROVIDER_SITE_OTHER): Payer: Medicaid Other | Admitting: *Deleted

## 2010-07-06 ENCOUNTER — Ambulatory Visit (INDEPENDENT_AMBULATORY_CARE_PROVIDER_SITE_OTHER): Payer: Medicaid Other | Admitting: *Deleted

## 2010-07-06 ENCOUNTER — Encounter: Payer: Medicaid Other | Admitting: *Deleted

## 2010-07-06 DIAGNOSIS — I4891 Unspecified atrial fibrillation: Secondary | ICD-10-CM

## 2010-07-06 LAB — GLUCOSE, RANDOM: Glucose, Bld: 85 mg/dL (ref 70–99)

## 2010-07-06 LAB — HEPATIC FUNCTION PANEL
ALT: 14 U/L (ref 0–35)
AST: 15 U/L (ref 0–37)
Albumin: 3.6 g/dL (ref 3.5–5.2)
Alkaline Phosphatase: 63 U/L (ref 39–117)
Bilirubin, Direct: 0 mg/dL (ref 0.0–0.3)
Total Bilirubin: 0.6 mg/dL (ref 0.3–1.2)
Total Protein: 7.3 g/dL (ref 6.0–8.3)

## 2010-07-06 LAB — LIPID PANEL
Cholesterol: 171 mg/dL (ref 0–200)
HDL: 52.1 mg/dL
LDL Cholesterol: 94 mg/dL (ref 0–99)
Total CHOL/HDL Ratio: 3
Triglycerides: 126 mg/dL (ref 0.0–149.0)
VLDL: 25.2 mg/dL (ref 0.0–40.0)

## 2010-07-06 MED ORDER — WARFARIN SODIUM 5 MG PO TABS: 5.0000 mg | ORAL_TABLET | ORAL | Status: DC

## 2010-07-06 NOTE — Discharge Summary (Signed)
  [name], [name] NO.:  [id number]  MEDICAL RECORD NO.:  [id number]           PATIENT TYPE:  LOCATION:                                 FACILITY:  PHYSICIAN:  Veverly Fells. Excell Seltzer, MD  DATE OF BIRTH:  [birth year]  DATE OF ADMISSION: DATE OF DISCHARGE:                              DISCHARGE SUMMARY   ADDENDUM  DISCHARGE MEDICATION LIST:  Includes: 1. Coumadin 2.5 mg 1 tablet p.o. daily. 2. Metoprolol tartrate 25 mg 1 tablet p.o. q.12 h. p.r.n. 3. Enteric-coated aspirin 81 mg p.o. every other day. 4. Calcium acetate 667 mg 1 capsule p.o. t.i.d. a day between meals. 5. Bystolic 10 mg p.o. daily. 6. Sublingual nitroglycerin 0.4 mg q.5 minutes up to 3 doses p.r.n.     for chest discomfort. 7. Ranitidine 150 mg 1 tablet p.o. nightly p.r.n.     [name], PAC   ______________________________ Veverly Fells. Excell Seltzer, MD    MS/MEDQ  D:  06/08/2010  T:  06/09/2010  Job:  [id number]  Electronically Signed by [name] PAC on 06/16/2010 02:44:39 PM Electronically Signed by Tonny Bollman MD on 07/06/2010 10:26:37 AM

## 2010-07-06 NOTE — Discharge Summary (Signed)
NAMEYACHET, MATTSON NO.:  [id number]  MEDICAL RECORD NO.:  [id number]           PATIENT TYPE:  I  LOCATION:  2020                         FACILITY:  [facility]  PHYSICIAN:  Veverly Fells. Excell Seltzer, MD  DATE OF BIRTH:  11/29/1935  DATE OF ADMISSION:  06/07/2010 DATE OF DISCHARGE:  06/08/2010                              DISCHARGE SUMMARY   PRIMARY CARDIOLOGIST:  Veverly Fells. Excell Seltzer, MD  PRIMARY CARE PHYSICIAN:  Dr. Aundria Rud.  NEPHROLOGIST:  Wilber Bihari. Caryn Section, MD  DISCHARGE DIAGNOSIS: 1. Atrial fibrillation with rapid ventricular response.     a.     Spontaneous conversion prior to admission, initiated on      Coumadin this hospitalization, INR to be checked at hemodialysis      and then sent to Coumadin Clinic at [facility]. 2. Depressed TSH.     a.     The patient will be instructed to follow up with her primary      care provider for recheck and T3, T4 check.  SECONDARY DIAGNOSES: 1. End-stage renal disease (HD on Tuesday, Thursday, and Saturday).     a.     INR to be followed at HD, [facility]. 2. Hypertension. 3. Chronic diastolic congestive heart failure.     a.     Two-D echocardiogram June 08, 2010:  LV cavity size normal,      wall thickness normal, LVEF vigorous at 65-70% with normal wall      motion and normal diastolic function parameters.  Trivial aortic      insufficiency.  Moderate mitral regurgitation.  Mild left atrial      enlargement.  PA peak pressure of 52 mmHg. 4. Chronic anemia secondary to end stage renal disease, S/P attempted     cholecystectomy (ultimately having only cholecystostomy tube),     December 2010. 5. History of NSTEMI.     a.     Negative Myoview with cardiac catheterizations is deferred      secondary to renal function (peak troponin 17.02).  ALLERGIES AND INTOLERANCES: 1. CODEINE. 2. INFED 3. VENOFER 4. PLAVIX.  (The patient unsure of reaction).  PROCEDURES: 1. EKG June 07, 2010:  Sinus  bradycardia, 54 bpm, PVC, nonspecific ST     changes. 2. Chest x-ray June 07, 2010:  Cardiomegaly.  ? minimal pulmonary     vascular congestion. 3. Two-D echocardiogram June 08, 2010:  Please see under secondary     diagnoses.  Chronic diastolic CHF. 4. EKG June 08, 2010:  NSR, 64 bpm.  Nonspecific ST-T wave changes     with no significant Q-waves.  Normal axis.  No evidence of     hypertrophy.  PR 136, QRS 76, and QTC of 4620.  HISTORY OF PRESENT ILLNESS:  Ms. Colborn is a 75 year old female with question of significant coronary artery disease with prior NSTEMI with peak troponin of 17, but no catheterization secondary to renal insufficiency and a negative Myoview completed for ischemic evaluation at that time, July 2011.  The patient has known history PAF, hypertension, ESRD, and chronic diastolic CHF, who presented to [facility] for  further evaluation of tachy palpitations and chest discomfort first noted 5 minutes from the end of her dialysis on date of presentation.  The patient was in her usual state of health until she had nearly completed her dialysis on June 07, 2010.  Five minutes prior to completing, she was noted to have elevated heart rates and rhythm on telemetry showed atrial fibrillation with RVR.  The patient had chest discomfort that was described as a pressure-like sensation in the substernal area without radiation.  She denied nausea, vomiting, shortness of breath and diaphoresis.  The patient denies chest discomfort in the absence of palpitations. However, with tachy palpitations the patient has had similar discomfort in the past.  The patient was taken to the emergency department and treated with metoprolol 5 mg IV x2 as well as been given a Cardizem bolus and then drip of 10 mg per hour.  When initially evaluated by Cardiology, she was still in atrial fibrillation with RVR, but subsequently converted spontaneously to sinus bradycardia with heart  rate in the 50s.  After conversion, the patient says she felt comfortable without chest pain, shortness of breath or any other complaints.  In the past, the patient has refused Coumadin, but is now opened to the idea.  She would like all of her doctors to be on board with this decision as well as discussed with her husband.  HOSPITAL COURSE:  The patient was admitted and cardiac enzymes were cycled and all 3 full sets were negative.  She had maintained normal sinus rhythm was asymptomatic on the morning of June 08, 2010.  Due to a CHADS VASC score of at least 4 (likely 5 with probable CAD) it was recommended by her primary cardiologist that she initiate Coumadin in order to decrease her risk of thromboembolic disease.  The patient was willing to have a trial of Coumadin and this will be initiated in the hospital and the patient will then be discharged as she is in normal sinus rhythm.  There will be no need for her INR to be therapeutic prior to DC.  INR checks will at least initially be completed and hemodialysis with communication with Coumadin Clinic at [facility].  The patient has first INR check scheduled for June 09, 2010, as she will have dialysis that day as well.  The patient will have a prescription for Coumadin, low-dose aspirin) decreased from full strength in lieu of initiation on Coumadin), and p.r.n. metoprolol prescriptions given upon discharge.  She also received a new full medication list and followup instructions.  The patient will receive Coumadin teaching from pharmacy as well prior to discharge.  DISCHARGE LABS:  WBC is 5.6, HGB 9.3, HCT 29.8, PLT count is 198.  Pro time 13.4, INR 1.0.  Sodium 140, potassium 4.1, chloride 101, bicarb 28, BUN 21, creatinine 4.25, glucose 95, calcium 8.9.  Cardiac enzymes negative x3 full sets.  Total cholesterol 164, triglycerides 98, HDL 41, LDL 103, total cholesterol/HDL ratio 4.0.  TSH  0.045.  FOLLOWUP PLANS AND APPOINTMENT: 1. Hemodialysis [facility] and INR check on June 09, 2010. 2. Dr. Tonny Bollman on June 29, 2010, at 11:15 a.m. 3. Dr. Aundria Rud in 1-2 weeks (The patient will call to set up     appointment).  DISCHARGE MEDICATIONS: 1. Bystolic 10 mg p.o. daily. 2. Metoprolol tartrate 25 mg p.o. q.12 h. p.r.n. 3. Ranitidine 150 mg 1 tablet p.o. nightly p.r.n. 4. Sublingual nitroglycerin p.r.n. for chest  discomfort. 5. Enteric-coated aspirin 81 mg every other day. 6. Calcium acetate 667 mg p.o. t.i.d.  DURATION OF DISCHARGE ENCOUNTER:  Including physician time was 35 minutes.     Jarrett Ables, PAC   ______________________________ Veverly Fells. Excell Seltzer, MD    MS/MEDQ  D:  06/08/2010  T:  06/09/2010  Job:  [id number]  cc:   Dr. Bettey Costa F. Caryn Section, M.D. Bard Herbert, MD  Electronically Signed by Jarrett Ables PAC on 06/16/2010 02:43:22 PM Electronically Signed by Tonny Bollman MD on 07/06/2010 10:26:35 AM

## 2010-07-13 ENCOUNTER — Encounter: Payer: Self-pay | Admitting: Cardiovascular Disease

## 2010-07-13 ENCOUNTER — Ambulatory Visit (INDEPENDENT_AMBULATORY_CARE_PROVIDER_SITE_OTHER): Payer: Medicaid Other | Admitting: *Deleted

## 2010-07-13 DIAGNOSIS — Z7901 Long term (current) use of anticoagulants: Secondary | ICD-10-CM

## 2010-07-13 DIAGNOSIS — I4891 Unspecified atrial fibrillation: Secondary | ICD-10-CM

## 2010-07-13 LAB — POCT INR: INR: 1.9

## 2010-07-16 ENCOUNTER — Emergency Department (HOSPITAL_COMMUNITY): Payer: Medicaid Other

## 2010-07-16 ENCOUNTER — Inpatient Hospital Stay (HOSPITAL_COMMUNITY)
Admission: EM | Admit: 2010-07-16 | Discharge: 2010-07-18 | DRG: 308 | Disposition: A | Discharge status: Home / Self Care | Payer: Medicaid Other | Attending: Internal Medicine | Admitting: Internal Medicine

## 2010-07-16 ENCOUNTER — Encounter: Payer: Self-pay | Admitting: Internal Medicine

## 2010-07-16 DIAGNOSIS — E039 Hypothyroidism, unspecified: Secondary | ICD-10-CM | POA: Diagnosis present

## 2010-07-16 DIAGNOSIS — N039 Chronic nephritic syndrome with unspecified morphologic changes: Secondary | ICD-10-CM | POA: Diagnosis present

## 2010-07-16 DIAGNOSIS — I4891 Unspecified atrial fibrillation: Principal | ICD-10-CM | POA: Diagnosis present

## 2010-07-16 DIAGNOSIS — N2581 Secondary hyperparathyroidism of renal origin: Secondary | ICD-10-CM | POA: Diagnosis present

## 2010-07-16 DIAGNOSIS — D631 Anemia in chronic kidney disease: Secondary | ICD-10-CM | POA: Diagnosis present

## 2010-07-16 DIAGNOSIS — I2510 Atherosclerotic heart disease of native coronary artery without angina pectoris: Secondary | ICD-10-CM | POA: Diagnosis present

## 2010-07-16 DIAGNOSIS — N186 End stage renal disease: Secondary | ICD-10-CM | POA: Diagnosis present

## 2010-07-16 DIAGNOSIS — I120 Hypertensive chronic kidney disease with stage 5 chronic kidney disease or end stage renal disease: Secondary | ICD-10-CM | POA: Diagnosis present

## 2010-07-16 LAB — CBC
HCT: 41.4 % (ref 36.0–46.0)
MCHC: 30.9 g/dL (ref 30.0–36.0)
Platelets: 227 10*3/uL (ref 150–400)
RDW: 16.7 % — ABNORMAL HIGH (ref 11.5–15.5)

## 2010-07-16 LAB — PROTIME-INR
INR: 1.88 — ABNORMAL HIGH (ref 0.00–1.49)
Prothrombin Time: 21.8 seconds — ABNORMAL HIGH (ref 11.6–15.2)

## 2010-07-16 LAB — DIFFERENTIAL
Basophils Absolute: 0 10*3/uL (ref 0.0–0.1)
Basophils Relative: 1 % (ref 0–1)
Eosinophils Absolute: 0.3 10*3/uL (ref 0.0–0.7)
Eosinophils Relative: 6 % — ABNORMAL HIGH (ref 0–5)
Monocytes Absolute: 0.6 10*3/uL (ref 0.1–1.0)

## 2010-07-16 LAB — BASIC METABOLIC PANEL
BUN: 21 mg/dL (ref 6–23)
Calcium: 9.7 mg/dL (ref 8.4–10.5)
Chloride: 97 mEq/L (ref 96–112)
Creatinine, Ser: 3.25 mg/dL — ABNORMAL HIGH (ref 0.4–1.2)

## 2010-07-16 NOTE — H&P (Signed)
Hospital Admission Note Date: 07/16/2010  Patient name: [name] Medical record number: [id number] Date of birth: 08/27/1935 Age: 75 y.o. Gender: female PCP: Ulyess Mort, MD  Medical Service:  Attending physician:  Dr. Onalee Hua  Pager: Resident (R2/R3):  Dr. Arvilla Market  Pager: [phone] Resident (R1):  Dr. Loistine Chance  Pager: [phone]  Chief Complaint: palpitations  History of Present Illness: 75 yo female with history of chronic a-fib, on coumadin for 1 month, ESRD on HD since 4 months ago (TuThSat at [address]), presents to [facility] ED with main concern of sudden onset palpitations during last hours of HD treatment. She has completed HD treatment today, 4 hours total and reports her BP was high in 160's/90's. She is not sure what provoked her palpitations, she had no chest pain, no fevers or chills, no abdominal or urinary concerns, no weakness or visual changes, no dizziness or syncopal episodes. She reports compliance with her medications, coumadin, metoprolol. She reports feeling better at the time and denies palpitations, chest pain, or shortness of breath.   Current Outpatient Prescriptions  Medication Sig Dispense Refill  . Calcium Acetate 667 MG TABS Take by mouth 3 (three) times daily before meals.        . metoprolol tartrate (LOPRESSOR) 25 MG tablet Take one-half tablet by mouth twice a day except on dialysis days,  Use 25mg  tablet as needed for heart rate greater than 110       . nitroGLYCERIN (NITROSTAT) 0.4 MG SL tablet Place 0.4 mg under the tongue every 5 (five) minutes as needed.        . ranitidine (ZANTAC) 150 MG tablet Take 150 mg by mouth daily.        Marland Kitchen warfarin (COUMADIN) 5 MG tablet Take 1 tablet (5 mg total) by mouth as directed.  50 tablet  3    Allergies: Codeine  Past Medical History  Diagnosis Date  . ESRD (end stage renal disease) on HD TuThSat   . HTN (hypertension)   . HLD (hyperlipidemia)   . Anemia   . PAF (paroxysmal atrial fibrillation) on Coumadin    . Non-ST elevation MI (NSTEMI)     Non invasive eval with Myoview negative for ischemia    No past surgical history on file.  Family History  Problem Relation Age of Onset  . Stroke    . Heart attack      History   Social History  . Marital Status: Married    Spouse Name: N/A    Number of Children: N/A  . Years of Education: N/A   Occupational History  . Not on file.   Social History Main Topics  . Smoking status: Never Smoker   . Smokeless tobacco: Not on file  . Alcohol Use: No  . Drug Use: Not on file  . Sexually Active: Not on file   Other Topics Concern  . Not on file   Social History Narrative  . No narrative on file    Review of Systems: Per HPI  Physical Exam: Constitutional: Patient is a well-developed and well-nourished in no acute distress and cooperative with exam. Alert and oriented x3.  Head: Normocephalic and atraumatic Ear: TM normal bilaterally Mouth: no erythema or exudates, MMM Eyes: PERRL, EOMI, conjunctivae normal, No scleral icterus.  Neck: Supple, Trachea midline normal ROM, No JVD, mass, thyromegaly, or carotid bruit present.  Cardiovascular: irregular rate and rhythm, HR 90-100's, no MRG, pulses symmetric and intact bilaterally Pulmonary/Chest: CTAB, no wheezes, rales, or rhonchi Abdominal:  Soft. Non-tender, non-distended, bowel sounds are normal, no masses, organomegaly, or guarding present.  Musculoskeletal: No joint deformities, erythema, or stiffness, ROM full and no nontender Neurological: A&O x3, Strenght is normal and symmetric bilaterally, cranial nerve II-XII are grossly intact, no focal motor deficit, sensory intact to light touch bilaterally.  Skin: Warm, dry and intact. No rash, cyanosis, or clubbing.   Lab results:  WBC                                      4.9               4.0-10.5         K/uL  RBC                                      4.53              3.87-5.11        MIL/uL  Hemoglobin (HGB)                          12.8              12.0-15.0        g/dL  Hematocrit (HCT)                         41.4              36.0-46.0        %  MCV                                      91.4              78.0-100.0       fL  MCH -                                    28.3              26.0-34.0        pg  MCHC                                     30.9              30.0-36.0        g/dL  RDW                                      16.7       h      11.5-15.5        %  Platelet Count (PLT)                     227               150-400          K/uL   Protime ( Prothrombin Time)  21.8       h      11.6-15.2        seconds  INR                                      1.88       h      0.00-1.49  Sodium (NA)                              138               135-145          mEq/L  Potassium (K)                            3.3        l      3.5-5.1          mEq/L  Chloride                                 97                96-112           mEq/L  CO2                                      31                19-32            mEq/L  Glucose                                  136        h      70-99            mg/dL  BUN                                      21                6-23             mg/dL  Creatinine                               3.25       h      0.4-1.2          mg/dL  GFR, Est Non African American            14         l      >60              mL/min  GFR, Est African American                17         l      >60  mL/min    Oversized comment, see footnote  1  Calcium                                  9.7               8.4-10.5         Mg/dL  Troponin I                               <0.30             <0.30            Ng/mL  Imaging results:   CXR - pending  Assessment & Plan by Problem:  1) Palpitations - in the setting of chronic a-fib, unclear what the provoking even is but possibly related to HD treatment. Pt has received diltiazem and metoprolol IV pushes and is currently running 90-110. Will admit  her to tele, obtain CE and cycle Q8 hours, will obtain subsequent EKG, TSH, electrolyte panel in the AM. Will continue metoprolol and will increase the dose to 50 mg BID. Metoprolol IV PRN if needed if heart rate not controlled. Will continue coumadin per pharmacy. Observe on tele, and follow up on CXR.   2) ESRD on HD - completed HD today, electrolytes are at pt's baseline, will follow up on AM labs, no need for HD, may consult renal if pt stays longer for in hospital HD (if needed)  3) HTN - cont home medications  4) A-fib - coumadin per pharmacy, daily coags  R1 Dr. Ho_______________[phone] R3 Dr. Magick____________[phone]

## 2010-07-17 ENCOUNTER — Inpatient Hospital Stay (HOSPITAL_COMMUNITY): Payer: Medicaid Other

## 2010-07-17 DIAGNOSIS — E0590 Thyrotoxicosis, unspecified without thyrotoxic crisis or storm: Secondary | ICD-10-CM

## 2010-07-17 DIAGNOSIS — I4891 Unspecified atrial fibrillation: Secondary | ICD-10-CM

## 2010-07-17 LAB — CBC
HCT: 41.9 % (ref 36.0–46.0)
Hemoglobin: 13 g/dL (ref 12.0–15.0)
WBC: 5.2 10*3/uL (ref 4.0–10.5)

## 2010-07-17 LAB — CARDIAC PANEL(CRET KIN+CKTOT+MB+TROPI)
CK, MB: 2 ng/mL (ref 0.3–4.0)
Relative Index: INVALID (ref 0.0–2.5)
Relative Index: INVALID (ref 0.0–2.5)
Total CK: 27 U/L (ref 7–177)
Total CK: 26 U/L (ref 7–177)

## 2010-07-17 LAB — BASIC METABOLIC PANEL
Calcium: 10 mg/dL (ref 8.4–10.5)
GFR calc Af Amer: 11 mL/min — ABNORMAL LOW (ref >60)
GFR calc non Af Amer: 9 mL/min — ABNORMAL LOW (ref >60)
Sodium: 143 mEq/L (ref 135–145)

## 2010-07-17 LAB — T4, FREE: Free T4: 1.94 ng/dL — ABNORMAL HIGH (ref 0.80–1.80)

## 2010-07-17 LAB — CK TOTAL AND CKMB (NOT AT ARMC): Total CK: 27 U/L (ref 7–177)

## 2010-07-18 ENCOUNTER — Telehealth: Payer: Self-pay | Admitting: Cardiovascular Disease

## 2010-07-18 LAB — RENAL FUNCTION PANEL
BUN: 39 mg/dL — ABNORMAL HIGH (ref 6–23)
CO2: 30 mEq/L (ref 19–32)
Chloride: 101 mEq/L (ref 96–112)
Creatinine, Ser: 5.58 mg/dL — ABNORMAL HIGH (ref 0.4–1.2)
GFR calc non Af Amer: 7 mL/min — ABNORMAL LOW (ref >60)

## 2010-07-18 NOTE — Telephone Encounter (Signed)
Pt needs to talk to you didn't explain why just wanted a call in cause he has information he has to give to you

## 2010-07-18 NOTE — Telephone Encounter (Signed)
I spoke with the pt's spouse and he wanted to make Dr Excell Seltzer aware that the pt was admitted to [facility] over the weekend due to rapid pulse.  The pt is in room 2007 and is being followed by the teaching service.  He wanted to let us know that the pt has a problem with her thyroid. He also said that she is getting medications that Dr Excell Seltzer did not prescribe.  He would like Dr Excell Seltzer to come and see the pt and look at her medications.  I made him aware that Dr Excell Seltzer is out today and I will forward this information to him for review.

## 2010-07-22 ENCOUNTER — Ambulatory Visit (INDEPENDENT_AMBULATORY_CARE_PROVIDER_SITE_OTHER): Payer: Medicaid Other | Admitting: *Deleted

## 2010-07-22 DIAGNOSIS — Z7901 Long term (current) use of anticoagulants: Secondary | ICD-10-CM

## 2010-07-22 DIAGNOSIS — I4891 Unspecified atrial fibrillation: Secondary | ICD-10-CM

## 2010-07-22 LAB — POCT INR: INR: 3.6

## 2010-07-26 LAB — THYROTROPIN RECEPTOR AUTOABS: Thyrotropin Receptor Ab: 8 %Inhibition (ref <17)

## 2010-07-26 NOTE — Assessment & Plan Note (Signed)
[facility]                            CARDIOLOGY OFFICE NOTE   [name], [name]                      MRN:          [id number]  DATE:07/30/2006                            DOB:          Dec 18, 1935    [name] returns for follow-up at the [facility] Cardiology office  on Jul 30, 2006.  She is a 75 year old woman who presents for follow-up  of hypertension and chest pain.  She is intolerant to almost all  antihypertensive medications.  She had previously complained of chest  pain while taking Norvasc.  I have started her on Tekturna to be taken  in conjunction with Norvasc to try to get some reasonable control of her  blood pressure, which is persistently high.  She has been evaluated with  a renal arterial duplex scan that showed a normal-caliber abdominal  aorta, normal bilateral kidney size, and normal renal arteries  bilaterally.  This was performed on April 10.  [name] has  discontinued Norvasc and her chest pain has completely resolved.  I had  initially planned on obtaining a stress test due to her inability to  tolerate Norvasc with the persistent chest pain but now that she has  discontinued it and the pain is completely stopped, I am not inclined to  do that.   From a symptomatic standpoint [name] has no new complaints.  She  is tolerating Tekturna.  She continues to report elevated blood  pressures on all of her home readings.   CURRENT MEDICATIONS:  1. Amitriptyline.  2. Torsemide 5 mg twice daily.  3. Tekturna 150 mg daily.   PHYSICAL EXAMINATION:  GENERAL:  She is alert and oriented, in no acute  distress.  VITAL SIGNS:  Weight is 152 pounds.  Blood pressure is 196/76, heart  rate is 83, respiratory rate is 16.  HEENT:  Normal.  NECK:  Normal carotid upstrokes without bruits.  Jugular venous pressure  is normal.  LUNGS:  Clear to auscultation bilaterally.  HEART:  Regular rate and rhythm with a soft S4  gallop.  ABDOMEN:  Soft, nontender.  EXTREMITIES:  No clubbing, cyanosis, or edema.  Peripheral pulses are  intact and equal.   ASSESSMENT:  [name] continues to manifest marked hypertension.  I have again reviewed the initial note from Dr. Aundria Rud, who has been  following her for many years and has tried her on multiple  antihypertensive medications of all classes.  Specifically, she has been  intolerant to hydrochlorothiazide, furosemide, indapamide, nadolol,  metoprolol, atenolol, labetalol, clonidine, diltiazem, felodipine,  quinapril, lisinopril, losartan, irbesartan, valsartan, and amlodipine.  I had a very frank discussion with Ms. Kulak and her husband today.  I think she has a decision to make regarding whether she is going to  tolerate some of the side effects of these medications versus the  inherent risk of leaving her hypertension completely untreated.  She is  willing to give a second trial to clonidine, and I will start her on a  low dose of 0.1 mg twice daily.  I will be surprised if she tolerates it  but thought it would be worth a trial.  I would like to see her back in  2 weeks to see how she is doing.  I have asked her to continue the  Tekturna since she is tolerating it well.  Unfortunately, it is not  effectively lowering her blood pressure.  I had a long discussion  regarding the importance of continuing on clonidine if she is able to  tolerate it to avoid problems with rebound hypertension.  She and her  husband are agreeable to a trial of this medication and we will see how  she does.     [name]. Excell Seltzer, MD  Electronically Signed    MDC/MedQ  DD: 07/30/2006  DT: 07/31/2006  Job #: [id number]   cc:   C. Ulyess Mort, M.D.

## 2010-07-26 NOTE — Assessment & Plan Note (Signed)
[facility]                            CARDIOLOGY OFFICE NOTE   UNDINE, NEALIS                      MRN:          [id number]  DATE:02/04/2007                            DOB:          Apr 29, 1935    [name] returns for followup at the [facility] Cardiology office on  February 04, 2007.   [name] is a delightful 76 year old woman with severe  hypertension and severe medication intolerances.  See previous notes for  complete details, but essentially [name] is intolerant to over  20 antihypertensive medications from all classes of known  antihypertensives.   Most recently, she has been tried on clonidine, Tekturna, Norvasc and  bisoprolol.  She has had side effects that have been intolerant to her  with each of these medications.  These have ranged from fatigue to arm  pain to lightheadedness and dizziness.  The one medication that she has  been able to stay on longterm has been torsemide.  Otherwise, she has  been intolerant to all other classes of medications.   She currently has no complaints now that she has discontinued her most  recent antihypertensive.   CURRENT MEDICATIONS:  1. Amitriptyline 25 mg at bedtime.  2. Torsemide 10 mg daily.   ALLERGIES:  CODEINE.   DRUG INTOLERANCES:  Multiple as noted above.   EXAMINATION:  The patient is alert and oriented.  She is in no acute  distress.  Blood pressure is 206/84, heart rate 64, respiratory rate 16,  weight is 154.  HEENT:  Normal.  NECK:  Normal carotid upstrokes without bruits.  Jugular venous pressure  is normal.  LUNGS:  Clear to auscultation bilaterally.  HEART:  Regular rate and rhythm without murmurs or gallops.  ABDOMEN:  Soft, nontender, no organomegaly.  EXTREMITIES:  No clubbing, cyanosis, or edema.   ASSESSMENT:  [name] has severe essential hypertension.  She has  been worked up for secondary causes of hypertension and this workup has  been  unrevealing.  Her blood pressure is markedly elevated, yet she has  had no signs of hypertensive urgency.  She specifically has denied  headaches, vision changes or chest pain over multiple office visits.  This has obviously been a frustrating situation for her treating  physicians as well as the patient and her husband.  We are going to try  her back on atenolol at a dose of 25 mg daily.  She tells me that she  has tolerated this better than other medications and would like to give  it another trial.  She will continue on torsemide.  If any new  antihypertensives come on the market we may give that a trial as well,  but I am doubtful that she will be able to tolerate any antihypertensive  medication on a longterm basis.   I will see her back in 4 months for followup.     Veverly Fells. Excell Seltzer, MD  Electronically Signed    MDC/MedQ  DD: 02/04/2007  DT: 02/05/2007  Job #: [id number]   cc:   C. Ulyess Mort, M.D.

## 2010-07-26 NOTE — Assessment & Plan Note (Signed)
[facility] HEALTHCARE                            CARDIOLOGY OFFICE NOTE   Lauren, Hines                      MRN:          [id number]  DATE:02/27/2008                            DOB:          November 07, 1935    REASON FOR VISIT:  Hypertension and palpitations.   HISTORY OF PRESENT ILLNESS:  [name] is a 75 year old woman with  severe hypertension and intolerance to almost all antihypertensive  medications.  I have seen her many times over the last few years, but  our last visit was in February.  She has been managed closely by Dr.  Aundria Rud.  She has various reactions to almost all medications.  With Dr.  Aundria Rud and I have tried her on a countless number of antihypertensives  of which she has tolerated poorly.  One of her main complaints that has  been attributed to a side effect of medications, has been palpitations  and a feeling of flushing associated with chest pain and shortness of  breath.  Because of the symptoms, an event recorder was ordered.  I have  copies of the tracings when she was symptomatic.  They demonstrate runs  of atrial fibrillation with RVR. Her heart rates are in the range of 150  beats per minute when she is in atrial fibrillation.  Her sinus rate has  been quite slow, generally in the 50s.  Her episodes of palpitation have  been fairly brief.   MEDICATIONS:  1. Amitriptyline at bedtime.  2. Carvedilol 6.25 mg b.i.d.  3. Furosemide 10 mg daily.   PHYSICAL EXAMINATION:  GENERAL:  The patient is alert and oriented.  She  is in no acute distress.  VITAL SIGNS:  Weight is 156 pounds, blood pressures 200/84, heart rate  57, and respiratory rate 16.  HEENT:  Normal.  NECK:  Normal carotid upstrokes.  No bruits.  JVP normal.  LUNGS:  Clear bilaterally.  HEART:  Bradycardic and regular.  No murmurs or gallops.  ABDOMEN:  Soft and nontender.  No organomegaly.  EXTREMITIES:  No clubbing, cyanosis, or edema.  Peripheral pulses are  intact and equal.   EKG shows sinus bradycardia and is otherwise within normal limits.   ASSESSMENT:  1. Paroxysmal atrial fibrillation.  Event recorder tracings were      reviewed.  She has had runs of atrial fibrillation with heart rates      as high as 150 beats per minute.  I suspect this has been going on      for a long time and likely accounts for her episodic symptoms.  I      have explained this to her in detail and then focused on the fact      that the symptoms are due to atrial fibrillation and are almost      certainly unrelated to medicine side effects, even though they have      been attributed to medications in the past.  We had a lengthy      discussion regarding her stroke risk and the rationale for      anticoagulation.  Her main  comorbidity that increases stroke risk      as her severe hypertension.  Her age is a borderline factor.  She      has no history of diabetes, left ventricular dysfunction, or      previous stroke.  In general, I would recommend Coumadin and I      explained the rationale for this.  She does not want to take      Coumadin because of the commitment to close followup and regular      monitoring.  She will therefore taken 325 mg aspirin daily.  Even      though this is less effective and Coumadin for long-term stroke      prevention, I truly think [name] would not tolerate      Coumadin because of her long history of drug intolerance.  She has      had problems with easy bruising on aspirin and it was discontinued      in the past.  I advised that this would be a side effect that she      would just have to live with it at this point.  2. Hypertension.  She brings in home blood pressure readings from      morning, afternoon, and evenings.  She has many readings and her      blood pressure control at home is actually improved where it has      been in the past.  Her systolic readings on average range from the      140s-160s with  diastolic readings in the 70s and 80s.  I discussed      increase of her Coreg to 12.5 mg twice daily for better control of      heart rate when she gets in the atrial fibrillation as well as      better control with blood pressure.  She was not willing to      increase the medicine because she is worried about her slow heart      rate when she is in sinus rhythm.   For followup, I will see [name] back in 4 months.  I asked her  to contact me if she has increased problems with palpitations.     Veverly Fells. Excell Seltzer, MD  Electronically Signed    MDC/MedQ  DD: 02/28/2008  DT: 02/28/2008  Job #: [id number]   cc:   C. Ulyess Mort, M.D.

## 2010-07-26 NOTE — Assessment & Plan Note (Signed)
[facility] HEALTHCARE                            CARDIOLOGY OFFICE NOTE   [name], [name]                      MRN:          [id number]  DATE:08/13/2006                            DOB:          [birth year]    REASON FOR VISIT:  [name] returns for followup at the [facility]  Cardiology Office on August 13, 2006.  She is a 75 year old woman well  known to me here today for hypertension followup.  She is intolerant to  almost all antihypertensives.  At the time of her last office visit she  was tolerating a combination of Tekturna and torsemide.  She had  recently discontinued Norvasc due to chest pain.  Her chest pain has  completely resolved since stopping Norvasc.  She has no symptoms at  present.  At that time I elected to start her on Clonidine and she was  prescribed 0.1 mg b.i.d.  She was unable to tolerate the twice daily  dosing due to facial tingling and twitching.  She is tolerating a 0.1 mg  dose of clonidine in the morning. She reports that her morning and early  afternoon blood pressures have ranged in the 130's to 150's systolic.  Her evening blood pressure readings have been higher in the range of  160's to 170's.   CURRENT MEDICATIONS:  1. Amitriptyline 25 mg at bedtime.  2. Torsemide 5 mg twice daily.  3. Tekturna 150 mg daily.  4. Clonidine 0.1 mg daily.   PHYSICAL EXAMINATION:  GENERAL APPEARANCE:  The patient is alert and  oriented.  She is in no acute distress.  VITAL SIGNS:  Weight is 153 pounds.  Blood pressure is 184/84. Heart  rate is 78.  Respiratory rate is 16.  HEENT:  Normal.  NECK:  Normal carotid upstrokes without bruits, jugular venous pressure  is normal.  LUNGS:  Clear to auscultation bilaterally.  HEART: Has regular rate and rhythm without murmurs or gallops.  ABDOMEN:  Soft, nontender.  No bruits. No masses.  EXTREMITIES:  No cyanosis, clubbing or edema.   ASSESSMENT:  [name] is currently stable with her  hypertension.  She is clearly not optimally controlled. However, in spite of a high  office reading today, her home blood pressures sound improved.  I am not  inclined to make any changes at this point as her tolerance of multiple  medications has been so poor.  Tolerating two medicines is as good as we  have done in some time.  I would like to keep her on her current  medicines and see her back in three  months for reassessment.  In three month's time we may consider increase  of her Tekturna dose.     Veverly Fells. Excell Seltzer, MD  Electronically Signed    MDC/MedQ  DD: 08/13/2006  DT: 08/14/2006  Job #: [id number]   cc:   C. Ulyess Mort, M.D.

## 2010-07-26 NOTE — Assessment & Plan Note (Signed)
[facility] HEALTHCARE                            CARDIOLOGY OFFICE NOTE   JAVONNE, LOUISSAINT                      MRN:          [id number]  DATE:11/15/2006                            DOB:          12-15-1935    Aluel Schwarz returns for followup to the [facility] Cardiology office on  November 15, 2006.  She is a 75 year old woman with severe hypertension,  who is intolerant to almost every antihypertensive medication.  Please  see my previous notes for past medications that she has been intolerant  of.   Most recently, I have tried her on clonidine, and she did not take this  due to tingling in her face.  I have asked her today if she was able to  tolerate the tingling because her blood pressure was much better  controlled, but she cannot.  She is unable to sleep because of the side  effect.  She continues on a combination of Tekturna and torsemide with  very poor blood pressure control.  Her systolic blood pressures at home  are in the range of 170-175 on most checks.  She feels well.  She has no  complaints today.  She specifically denies headaches, visual changes,  chest pain, dyspnea, or edema.   CURRENT MEDICATIONS:  1. Amitriptyline 25 mg at bedtime.  2. Torsemide 10 mg daily.  3. Tekturna 150 mg daily.   As above, she has discontinued clonidine.   PHYSICAL EXAMINATION:  She is alert and oriented in no acute distress.  Weight is 152 pounds.  Blood pressure is 206/90.  Heart rate is 54.  NECK:  Normal carotid upstrokes without bruits.  Jugular venous pressure  is normal.  LUNGS:  Clear to auscultation bilaterally.  CARDIAC:  Regular rate and rhythm without murmurs or gallops.  ABDOMEN:  Soft and nontender.  No organomegaly.  EXTREMITIES:  No clubbing, cyanosis or edema.  Peripheral pulses are  intact and equal throughout.   EKG shows sinus bradycardia and is otherwise within normal limits.   ASSESSMENT:  Ms. Denno continues to have poor  blood pressure  control.  I have reviewed multiple medications with her again to try and  find something she may be able to tolerate.  She tells me today that she  was on atenolol at some point and tolerated that for a long time.  She  became intolerant but is uncertain whether it was the atenolol or some  other medication.  In any event, she has not taken beta blockers in  quite some time.  While she has mild sinus bradycardia today, I have  looked back through my previous notes, and her heart rate is generally  in the upper 70s or low 80s.  I am going to give her a trial of  bisoprolol 5 mg to see if she is able to tolerate this in addition to  her Tekturna and torsemide.   She is going to monitor her blood pressures at home and will call with  the results.  She will follow up in eight weeks.     Veverly Fells. Excell Seltzer, MD  Electronically Signed    MDC/MedQ  DD: 11/15/2006  DT: 11/15/2006  Job #: [id number]   cc:   C. Ulyess Mort, M.D.

## 2010-07-26 NOTE — Assessment & Plan Note (Signed)
[facility] HEALTHCARE                            CARDIOLOGY OFFICE NOTE   [name], [name]                      MRN:          [id number]  DATE:05/09/2007                            DOB:          [birth year]    [name] presented for follow-up at the [facility] cardiology office  on May 09, 2007.  [name] is a 75 year old woman with severe  hypertension and complete medication intolerance.  I know her well and  have seen her multiple times over the last year.  She has been  intolerant to every antihypertensive medication that I know of.  Most  recently she has been tried on Tekturna, but she has also been tried on  every other agent that is currently used for hypertension by Dr. Aundria Rud  over a several year period.  She has various intolerances to all these  medications.  She has recently discontinued the low dose atenolol due to  vomiting.  Her home blood pressures have been in the range of 170/80.  She has had no headaches, vision changes or chest pain.   CURRENT MEDICATIONS:  Amitriptyline, torsemide 10 mg daily.   ALLERGIES:  CODEINE   On exam she is alert and oriented.  No acute distress.  Weight 154,  blood pressures 220/80, heart rate 56, respiratory rate 16.  HEENT:  Normal.  NECK:  Normal carotid upstrokes without bruits.  Jugular venous pressure  is normal.  LUNGS:  Clear bilaterally.  HEART regular rate and rhythm.  ABDOMEN:  Soft, nontender no organomegaly.  EXTREMITIES:  No clubbing, cyanosis or edema.   EKG shows sinus bradycardia and is otherwise within normal limits.  Heart rate 56 beats per minute.   ASSESSMENT:  [name] continues to have poorly controlled  hypertension due to intolerance of antihypertensive therapy.  She does  not have renovascular hypertension as her renal duplex scan was  completely normal.  I am going to try her back on clonidine 0.1 mg at  bedtime.  The side effects she experienced with this  medicine was facial  tingling.  I had another frank discussion with the [name] today and  told them that she has a decision to make regarding raising her  threshold for medication intolerance  versus accepting a real risk of  stroke with her significant long-term blood pressure elevation.  I do  not see any reason to revisit other  medications at this point or to try her on a higher dose of clonidine  because it will be lucky if she can tolerate even a low dose of this  medication.  I will see her back in 4 months.     Veverly Fells. Excell Seltzer, MD  Electronically Signed    MDC/MedQ  DD: 05/09/2007  DT: 05/10/2007  Job #: [id number]   cc:   C. Ulyess Mort, M.D.

## 2010-07-26 NOTE — Assessment & Plan Note (Signed)
[facility] HEALTHCARE                                 ON-CALL NOTE   DAYSIE, HELF                      MRN:          [id number]  DATE:02/22/2008                            DOB:          11-17-1935    She was previously seen Dr. Tonny Bollman.   I received a call from Debbie with [employer], stating that Ms.  Lingerfelt was recently placed on a [employer] monitor and today was  noted to be in atrial flutter with rates in the 170s and 180s.  The  company tried to contact Ms. Lawler, however, there was no answer.  I tried to contact the patient and also there was no answer.  In  reviewing the records, it appears the monitor was placed by Dr. Aundria Rud  from [facility] and I do not see that she has seen Dr. Excell Seltzer  recently.  I will continue to try and contact the patient as it appears  she may need to be admitted.     Nicolasa Ducking, ANP  Electronically Signed    CB/MedQ  DD: 02/22/2008  DT: 02/22/2008  Job #: [id number]

## 2010-07-26 NOTE — Letter (Signed)
February 16, 2010     RE:  [name], [name]  MRN:  [id number]  /  DOB:  1935/12/21   To Whom It May Concern:   [name] is a 75 year old woman who I have followed for several  years at my Cardiology Practice.  [name] has become increasingly  ill over the past 6 months with development of congestive heart failure  and end-stage renal disease.  Due to her significant medical problems,  she is unable to travel at this time.  She is approaching the need for  hemodialysis.   If there are any questions regarding this matter, please feel free to  contact me at my office at any time.  Our contact number is [phone].    Sincerely,      Veverly Fells. Excell Seltzer, MD  Electronically Signed    MDC/MedQ  DD: 02/16/2010  DT: 02/17/2010  Job #: [id number]

## 2010-07-26 NOTE — Assessment & Plan Note (Signed)
OFFICE VISIT   SUMEYA, [name]  DOB:  [birth year]                                       11/09/2009  BJYNW#:[id number]   The patient had a left upper arm fistula created by me on July 8.  She  has chronic renal insufficiency but has not yet had hemodialysis.  The  fistula is functioning excellently at the present time with a good  strong pulse and palpable thrill with a nicely healed antecubital wound.  She has one little patch of numbness in the forearm but otherwise the  hand is well-perfused and has an excellent radial pulse.  There are no  symptoms of steal.  I told her the fistula could be used in mid October  if necessary and she will return to see [place] on a p.r.n. basis.     Quita Skye Hart [place], M.D.  Electronically Signed   JDL/MEDQ  D:  11/09/2009  T:  11/10/2009  Job:  [id number]

## 2010-07-28 NOTE — Discharge Summary (Signed)
[name], [name] NO.:  [id number]  MEDICAL RECORD NO.:  [id number]           PATIENT TYPE:  I  LOCATION:  2007                         FACILITY:  [facility]  PHYSICIAN:  Mariea Stable, MD   DATE OF BIRTH:  30-Nov-1935  DATE OF ADMISSION:  07/16/2010 DATE OF DISCHARGE:  07/18/2010                              DISCHARGE SUMMARY   DISCHARGE DIAGNOSES: 1. Paroxysmal atrial fibrillation. 2. Hyperthyroidism, likely Graves'Disease.  Newly diagnosed. 3. End-stage renal disease on hemodialysis Tuesday, Thursday, and     Saturday. 4. Hypertension. 5. Hyperlipidemia. 6. Anemia of chronic disease. 7. History of non-ST segment elevation myocardial infarction in July     2011, noninvasive evaluation with Myoview negative for ischemia.  A     2-D echo in March 2012, showed an ejection fraction of 65-70%.     Grade 1 diastolic dysfunction.  Peak PA pressure 52. 8. History of biliary colic cholecystostomy, but not cholecystectomy     secondary to omental adhesion.  DISCHARGE MEDICATIONS: 1. Methimazole 5 mg p.o. q.8 hours. 2. Atenolol 25 mg p.o. daily. 3. Ranitidine 150 mg p.o. daily. 4. Calcium acetate 667 mg p.o. t.i.d. 5. Warfarin 5 mg p.o. daily, take 1 tablet except on dialysis days on     Tuesday, Thursday, and Saturday take 1-1/2 half tablet. 6. Cyclosporine ophthalmic 1 drop b.i.d.  DISPOSITION AND FOLLOWUP:  The patient was discharged in stable condition.  She will follow up with Dr. Tonny Branch on Aug 02, 2009, at 9:15 at the [facility]patient [facility].  During this hospital admission, please evaluate the patient's symptoms.  The patient was newly diagnosed with hyperthyroidism. Follow up and liver function tests since methimazole can cause changes in liver function.  Furthermore please follow up thyroid antibodies test results which were ordered during hospital admission that were pending during discharge.  The patient will further follow up with Dr. Excell Seltzer,  Cardiology, on August 12, 2010 at 9:00 a.m. for hospital followup.  She will furthermore follow up with Dr. Lucianne Muss, Endocrinology on September 02, 2010, to establish care with an endocrinologist for hyperthyroidism possibly Graves disease.  PROCEDURES: 1. Chest x-ray on Jul 17, 2010, showed mild cardiac enlargement with     prominent pulmonary outflow tract.  Mild prominence of central     pulmonary vascularity and pulmonary edema.  Findings as noted to     previous study.  No significant effusion.  No focal airspace     disease or consultation in the lungs.  Impression, mild cardiac     enlargement, pulmonary vascular prominence related to prior study.     No pulmonary edema or consolidation. 2. Thyroid ultrasound on Jul 17, 2010, showed right thyroid lobe     measures 3.8 x 2.9 x 1.3 cm, left thyroid lobe measures 3.9 x 2.4 x     1.4 cm, isthmus is 4 mm.  Focal nodular right thyroid lobe     incidental thickening with shadowing echogenicity, calcification in     the right inferior thyroid.  Next, left tyroid lobe.  No discrete     left thyroid abnormality.  Thyroid echotexture is diffusely  heterogenous throughout.  Lymphadenopathy not visualized.     Impression, no suspicious masses or nodules.  Nonspecific diffuse     heterogenous thyroid gland.  A 6-mm right inferior thyroid     dystrophy consultation.  BRIEF ADMISSION HISTORY AND PHYSICAL:  This is a 75 year old female with history of paroxysmal AFib on Coumadin for 1 month, end-stage renal disease on hemodialysis 4 months ago Tuesday, Thursday, and Saturday at [facility] who presented to the ED with main concern of sudden onset of palpitation during last hemodialysis treatment.  She has completed hemodialysis treatment today 4 hours total and reports her blood pressure was high in the 60s to 90s.  She is not sure what provoked her palpitation.  She had no chest pain, no fevers or chills, no abdominal or urinary concerns, no  weakness or visual changes.  No dizziness or syncopal episodes.  She reports compliance with her medication, Coumadin, metoprolol.  She reports feeling better at that time and denies palpitation, chest pain, or shortness of breath.  PHYSICAL EXAMINATION:  VITAL SIGNS:  Temperature 98.1, heart rate 90- 120, blood pressure 154/79, saturations 95% on room air, respiratory rate 18. GENERAL:  The patient is well-developed, well-nourished, in no acute distress. HEAD:  Normocephalic, atraumatic. EYES:  Tympanic membranes are normal. MOUTH:  No erythema or exudate.  Mucous membranes are moist. EYES:  PERRL, EOMI.  Conjunctiva normal. NECK:  Supple.  Trachea midline.  Normal JVD. CARDIOVASCULAR:  Regular rate and rhythm.  Systolic murmur radiating to the axilla. PULMONARY:  Clear to auscultation bilaterally. ABDOMEN:  Soft, nontender, nondistended.  Bowel sounds present.  No masses or organomegaly. MUSCULOSKELETAL:  Left AV fistula with good thrill.  No joint deformities, erythema, or stiffness.  Full range of motion, nontender. NEUROLOGIC:  Alert and oriented x3.  Strength is normal and symmetric bilaterally.  Cranial nerves II through XII are grossly intact. No focal deficits. SKIN:  Warm, dry, and intact.  No rashes noted.  LABORATORY DATA:  WBC 4.9, hemoglobin 12.8, hematocrit 41.4, platelets 127.  PT 21.8, INR 1.88.  BMET; sodium 138, potassium 3.3, chloride 97, CO2 31, glucose 136, BUN 21, creatinine 3.25, calcium 9.1.  First set of cardiac enzymes negative.  HOSPITAL COURSE: 1. Palpitations secondary to atrial fibrillation, probably aggravated     by hyperthyroidism.  The patient received initial diltiazem and     metoprolol IV pushes in the ED which resolved the palpitations.  The     patient remained free of symptoms during the rest of the hospital     admission.  The patient was continued on her home medication.     Cardiac enzymes were negative x3.  No EKG changes was noted  therefore  less     likely, palpitation was due to acute ischemic event.     The patient was continued home medication including Coumadin. 2. Hyperthyroidism most likely Graves disease, newly diagnosed TSH was     found to be less than 0.008, which was decreased compared to      TSH in March  which was 0.03.  Free T4 and T3 were elevated at 1.94 and 5.3 respectively.  The patient was started     on methimazole.  A thyroid ultrasound was done, which showed no     nodules, but showed diffuse heterogeneous thyroid gland.  The patient was recommended to follow up with an     endocrinologist as an outpatient for further management.  At the time of discharge  Thyrotropin receptor     antibody were pending.  This should be follow up as an outpatient. 3. Hypertension.  The patient remained stable during hospitalization.     The patient was switched from metoprolol to atenolol per upotdate recommendation in a setting      of hyperthyroidism. 4. End-stage disease.  No hemodialysis was required during this     hospitalization. 5. Atrial fibrillation.  Coumadin therapy was continued. For rate control patient will continue beta blocker.   The patient will follow up as an outpatient to recheck INR on a regular basis.  DISCHARGE VITALS:  Temperature 97.7, pulse 57, respiratory rate 18, blood pressure 154/56, saturation 100% on room air.  DISCHARGE LABS:  Renal panel; sodium 141, potassium 3.5, chloride 101, CO2 of 30, glucose 93, BUN 39, creatinine 5.58, albumin 3, calcium 9.8, phosphorus 5.3, PT 26.2, INR 2.39.    ______________________________ Almyra Deforest, MD   ______________________________ Mariea Stable, MD    JI/MEDQ  D:  07/21/2010  T:  07/22/2010  Job:  [id number]  cc:   Dr. Aundria Rud  Electronically Signed by Almyra Deforest MD on 07/27/2010 02:09:57 PM Electronically Signed by Mariea Stable MD on 07/28/2010 05:05:10 PM

## 2010-07-29 ENCOUNTER — Ambulatory Visit (INDEPENDENT_AMBULATORY_CARE_PROVIDER_SITE_OTHER): Payer: Medicaid Other | Admitting: Internal Medicine

## 2010-07-29 ENCOUNTER — Encounter: Payer: Self-pay | Admitting: Internal Medicine

## 2010-07-29 VITALS — BP 181/54 | HR 55 | Temp 97.8°F | Ht 64.0 in | Wt 113.7 lb

## 2010-07-29 DIAGNOSIS — E0590 Thyrotoxicosis, unspecified without thyrotoxic crisis or storm: Secondary | ICD-10-CM | POA: Insufficient documentation

## 2010-07-29 MED ORDER — ATENOLOL 25 MG PO TABS: 25.0000 mg | ORAL_TABLET | Freq: Every day | ORAL | Status: DC

## 2010-07-29 MED ORDER — PROPYLTHIOURACIL 50 MG PO TABS: 50.0000 mg | ORAL_TABLET | Freq: Three times a day (TID) | ORAL | Status: DC

## 2010-07-29 NOTE — Letter (Signed)
June 05, 2006    C. Ulyess Mort, M.D.  [address]  [place]  [place] [address]   RE:  [name], [name]  MRN:  [id number]  /  DOB:  May 21, 1935   Dear Dr. Aundria Rud:   It was my pleasure to see Lynnleigh Soden as an outpatient at the  [facility] on June 05, 2006.  As you know, she is a  delightful 75 year old woman who has long standing hypertension and has  recently developed episodes of nocturnal chest pain.  Her biggest issue  has been intolerance to almost all anti-hypertensive medications. She  has had suboptimal treatment of her blood pressure  over the years  because she cannot tolerate any medications.  In reviewing the  medications that you have tried, it looks like she has been tried on  just about every class of anti-hypertensive medicine with inability to  tolerate Dyazide and Loop diuretics, multiple beta blockers, Ace-  inhibitors, angiotension receptor blockers and calcium channel blockers,  as well as clonidine.  Currently, she is taking  torsemide and once  daily Norvasc for her blood pressure, but she is having some difficulty  tolerating Norvasc as well and she has recently reduced her dose from 5  mg twice daily to 5 mg once daily.  She informs me that she has had  hypertension for many years.   Recently, she has developed very focal left sided chest pain, that  occurs mainly in the middle of the night.  She attributed this to her  Norvasc and the symptoms have improved since she has discontinued her  nightly dose of Norvasc.  She is fairly sedentary but reports that she  walked 2 miles yesterday and had no chest pain, dyspnea or other  symptoms.  She denies light headedness, syncope, palpitations, orthopnea  or PND.  She does complain of some lower extremity edema which is  chronic.   Her current medications include:  1. Amitriptyline at bedtime.  2. Torsemide 5 mg twice daily.  3. Norvasc 5 mg daily.   ALLERGIES:  Include CODEINE.   PAST MEDICAL HISTORY:  Is pertinent for essential hypertension as  detailed above.  She does have mild chronic renal insufficiency.  I do  not know the degree of her renal insufficiency or creatinine levels.  She has hyperlipidemia with an LDL of 149 and an HDL of 53 in June 2007.  She did not tolerate Zocor therapy. She has had cholelithiasis and  anemia as well.  She has never had surgery.   SOCIAL HISTORY:  She is married.  Has no children.  She is originally  from [place] but has lived in [place] for 25 years.  She is a  [employer] trader.  She does not smoke cigarettes or drink alcohol.  She does  not drink caffeine and does not exercise regularly.   FAMILY HISTORY:  Her mother died at age 22 of a myocardial infarction.  Her father had hypertension and died of a stroke at age 19.  She has 2  sisters who are alive and well and a brother who died at age 38 of a  myocardial infarction.  He also had hypertension.   REVIEW OF SYSTEMS:  Complete 12 point review of systems was performed.  All systems were negative except as detailed above.   PHYSICAL EXAMINATION:  She is alert and oriented and in no acute  distress.  Her weight is 152 pounds.  Initial blood pressure is 198/77, my check  the left arm blood pressure was 170/80 and the right arm blood pressure  was 160/75.  Heart rate 78.  Respiratory rate 16.  HEENT:  Is normal.  NECK:  Normal carotid upstrokes without bruits. Jugular venous pressure  is normal.  There is no thyromegaly or thyroid nodules.  LUNGS:  Are clear to auscultation bilaterally.  HEART:  The apex is discrete and nondisplaced.  The heart is regular  rate and rhythm.  I do not appreciate a S4 gallop.  There are no  murmurs.  There is no right ventricular heave or lift.  ABDOMEN:  Is soft, nontender. No organomegaly.  No abdominal bruits.  No  masses.  EXTREMITIES:  There is 1+ bilateral pretibial edema.  Peripheral pulses  are 2+ and equal throughout.  SKIN:  Is warm  and dry without rash.  LYMPHATICS:  There is no adenopathy.  NEUROLOGIC:  Cranial nerves II through XII are intact.  Strength 5/5 and  equal in the arms and leg bilaterally.   EKG shows normal sinus rhythm and is within normal limits.   ASSESSMENT:  Ms. Jeancharles is a 75 year old woman with the following  cardiac issues:  1. Hypertension with inability to tolerate multiple anti-hypertensive      medications.  This seems to be her main issue at the present time.      I suspect she has essential hypertension but I think it is      worthwhile to check a renal arterial duplex ultrasound to rule out      significant renal artery stenosis.  This would obviously be a      treatable cause of hypertension and if she has this problem, this      may allow [place] to minimize her antihypertensive medications.  If her      renal ultrasound is unremarkable, then the big question is whether      there are any medications she will tolerate.  After reviewing all      of the medicines that she is intolerant to, the only potential      medications I know of would be an alpha blocker or Tekturna, which      is a renin inhibitor.  After reviewing this with you by telephone,      I agree the chances of her tolerating an alpha blocker are low.      Danie Chandler may be a reasonable choice and I will review this with her      when I bring her back for follow up in a few weeks.  I really do      not know of any other reasonable options for treating her blood      pressure.  2. Atypical chest pain.  At this point I think her pain is noncardiac.      I will review this with her once again when she returns for      followup, but I do not think she will require any stress testing      unless she develops exertional symptoms or if she becomes more      symptomatic.  Her symptoms seemed to have improved since she has      reduced her Norvasc dose.  3. Hypercholesterolemia.  I agree at this point that it is probably      better to focus on treatment of her hypertension, as it is very      unlikely that she will tolerate statin medications after already  failing Zocor.   Dr. Aundria Rud, thanks again for allowing me to see Mrs. Saetern.  She  certainly presents with a challenging problem of finding anti-  hypertensive medication that she can tolerate.  I will be in touch with  you after the results of her renal ultrasound and at the time of her  followup visit .  Please feel free to call at anytime with questions  regarding her care.    Sincerely,      Veverly Fells. Excell Seltzer, MD  Electronically Signed    MDC/MedQ  DD: 06/06/2006  DT: 06/06/2006  Job #: [id number]

## 2010-07-29 NOTE — Progress Notes (Signed)
47 woman on dialysis.  Has atrial fib and was recently hospitalized for RVR and found to have hyper-thyroidism.  TSH undetectable; both Free T3 and T4 up.  Scan free of nodules.  TSH receptor antibody negative.  No uptake done.  No eye findings.  Apparantly Graves assumed and she was d/c on methimazole tid.  As usual she is intolerant of methimazole -- says it caused diffuse pains and she has already stopped it.  Has app't with Dr. Lucianne Muss on June 22. Not especially heat intolerant at present.  No diarrhea or palpitations.  HR = 55. Will try ptu 50 tid for interim until she sees Dr. Lucianne Muss.  It has been very frustrating trying to treat her over many years.  She was intolerant to over 30 different anti-hypertensives of every class, and finally developed ESRD from nephrosclerosis.  I described radio-iodine treatment in detail.  This decision can be left to Dr. Lucianne Muss.  It may be appropriate to do an uptake to confirm that this is Graves and not thyroiditis.

## 2010-07-29 NOTE — Assessment & Plan Note (Signed)
[facility]                            CARDIOLOGY OFFICE NOTE   [name], [name]                      MRN:          [id number]  DATE:06/26/2006                            DOB:          [birth year]    [name] returns to the [facility] cardiology office on June 26, 2006.  She is a 75 year old woman who I saw back on March 25 for  hypertension.  She was referred by Dr. Aundria Rud, who has been treating her  for many years for her hypertension.  She has been unable to tolerate  almost all antihypertensives.  Please see my note from March 25 for  details.  At that time, I elected to order a renal duplex ultrasound to  evaluate for renovascular hypertension.  I did not make any changes to  her medications but wanted to evaluate her back to further discuss the  addition of more antihypertensive medication to get her blood pressure  under better control.  Of note, her blood pressure at that office visit  initially was 198/77, and then on my recheck was 170/80.   In the interim, she has no new complaints.  She is doing relatively  well.  She continues to complain of some chest pain that occurs only at  night when lying in bed.  She has no exertional symptoms.  She is  convinced that she has chest pain because of Norvasc.   CURRENT MEDICATIONS:  Amitriptyline, torsemide 5 mg twice daily, and  Norvasc 5 mg daily.   PHYSICAL EXAMINATION:  Blood pressure today is 156/70 with a heart rate  of 78.  Weight is 153 pounds.  Respiratory rate was 16.  The remainder  of the exam was not performed as our time was spent in consultation.   Renal duplex ultrasound performed on April 10 demonstrated normal  caliber abdominal aorta with normal bilateral kidney size and normal  renal arteries.   ASSESSMENT:  [name] is a 75 year old woman with hypertension.  She appears to have essential hypertension and has no evidence of  renovascular hypertension.  I  discussed treatment options again with  [name] and her husband at length.  Dr. Aundria Rud has tried her on  almost every antihypertensive medication that is available, and she has  been intolerant to everything.  I am going to give her a trial of  Tekturna 150 mg daily to see if she tolerates this since this represents  a novel class of antihypertensive medication and is a direct renin  inhibitor.  This is typically very well tolerated although, admittedly,  I have little personal experience with this medication.  If she does not  tolerate this, then I really do not have any other ideas for  antihypertensive therapies.   I will plan on seeing [name] back in one month for followup and  to see how she is doing on [place].  She was given a one month supply  today in the office.     Veverly Fells. Excell Seltzer, MD  Electronically Signed    MDC/MedQ  DD: 06/26/2006  DT: 06/27/2006  Job #: [id number]   cc:   C. Ulyess Mort, M.D.

## 2010-08-01 ENCOUNTER — Ambulatory Visit (INDEPENDENT_AMBULATORY_CARE_PROVIDER_SITE_OTHER): Payer: Medicaid Other | Admitting: *Deleted

## 2010-08-01 ENCOUNTER — Encounter: Payer: Self-pay | Admitting: Internal Medicine

## 2010-08-01 DIAGNOSIS — I4891 Unspecified atrial fibrillation: Secondary | ICD-10-CM

## 2010-08-01 DIAGNOSIS — Z7901 Long term (current) use of anticoagulants: Secondary | ICD-10-CM

## 2010-08-01 LAB — POCT INR: INR: 4.2

## 2010-08-02 ENCOUNTER — Encounter: Payer: Self-pay | Admitting: *Deleted

## 2010-08-03 ENCOUNTER — Inpatient Hospital Stay (HOSPITAL_COMMUNITY): Payer: Medicaid Other

## 2010-08-03 ENCOUNTER — Inpatient Hospital Stay (HOSPITAL_COMMUNITY)
Admission: EM | Admit: 2010-08-03 | Discharge: 2010-08-04 | DRG: 308 | Disposition: A | Discharge status: Home / Self Care | Payer: Medicaid Other | Attending: Internal Medicine | Admitting: Internal Medicine

## 2010-08-03 ENCOUNTER — Encounter: Payer: Self-pay | Admitting: Internal Medicine

## 2010-08-03 ENCOUNTER — Encounter: Payer: Medicare Other | Admitting: Internal Medicine

## 2010-08-03 ENCOUNTER — Ambulatory Visit (HOSPITAL_COMMUNITY): Payer: Medicaid Other

## 2010-08-03 ENCOUNTER — Telehealth: Payer: Self-pay | Admitting: Cardiovascular Disease

## 2010-08-03 DIAGNOSIS — N186 End stage renal disease: Secondary | ICD-10-CM | POA: Diagnosis present

## 2010-08-03 DIAGNOSIS — E785 Hyperlipidemia, unspecified: Secondary | ICD-10-CM | POA: Diagnosis present

## 2010-08-03 DIAGNOSIS — Z9119 Patient's noncompliance with other medical treatment and regimen: Secondary | ICD-10-CM

## 2010-08-03 DIAGNOSIS — I120 Hypertensive chronic kidney disease with stage 5 chronic kidney disease or end stage renal disease: Secondary | ICD-10-CM | POA: Diagnosis present

## 2010-08-03 DIAGNOSIS — Z7901 Long term (current) use of anticoagulants: Secondary | ICD-10-CM

## 2010-08-03 DIAGNOSIS — K219 Gastro-esophageal reflux disease without esophagitis: Secondary | ICD-10-CM | POA: Diagnosis present

## 2010-08-03 DIAGNOSIS — I498 Other specified cardiac arrhythmias: Secondary | ICD-10-CM | POA: Diagnosis present

## 2010-08-03 DIAGNOSIS — Z992 Dependence on renal dialysis: Secondary | ICD-10-CM

## 2010-08-03 DIAGNOSIS — Z91199 Patient's noncompliance with other medical treatment and regimen due to unspecified reason: Secondary | ICD-10-CM

## 2010-08-03 DIAGNOSIS — I252 Old myocardial infarction: Secondary | ICD-10-CM

## 2010-08-03 DIAGNOSIS — N2581 Secondary hyperparathyroidism of renal origin: Secondary | ICD-10-CM | POA: Diagnosis present

## 2010-08-03 DIAGNOSIS — F411 Generalized anxiety disorder: Secondary | ICD-10-CM | POA: Diagnosis present

## 2010-08-03 DIAGNOSIS — E0500 Thyrotoxicosis with diffuse goiter without thyrotoxic crisis or storm: Secondary | ICD-10-CM | POA: Diagnosis present

## 2010-08-03 DIAGNOSIS — D631 Anemia in chronic kidney disease: Secondary | ICD-10-CM | POA: Diagnosis present

## 2010-08-03 DIAGNOSIS — I4891 Unspecified atrial fibrillation: Principal | ICD-10-CM | POA: Diagnosis present

## 2010-08-03 DIAGNOSIS — Z950 Presence of cardiac pacemaker: Secondary | ICD-10-CM

## 2010-08-03 LAB — TROPONIN I: Troponin I: <0.3 ng/mL (ref <0.30)

## 2010-08-03 LAB — CARDIAC PANEL(CRET KIN+CKTOT+MB+TROPI)
CK, MB: 2.1 ng/mL (ref 0.3–4.0)
CK, MB: 1.8 ng/mL (ref 0.3–4.0)
CK, MB: 1.5 ng/mL (ref 0.3–4.0)
Total CK: 26 U/L (ref 7–177)
Total CK: 27 U/L (ref 7–177)
Total CK: 24 U/L (ref 7–177)
Troponin I: <0.3 ng/mL (ref <0.30)
Troponin I: <0.3 ng/mL (ref <0.30)

## 2010-08-03 LAB — CBC
HCT: 43.2 % (ref 36.0–46.0)
Hemoglobin: 13.5 g/dL (ref 12.0–15.0)
MCH: 27.3 pg (ref 26.0–34.0)
MCHC: 31.3 g/dL (ref 30.0–36.0)
MCV: 87.3 fL (ref 78.0–100.0)
RDW: 16.3 % — ABNORMAL HIGH (ref 11.5–15.5)

## 2010-08-03 LAB — POCT I-STAT, CHEM 8
Calcium, Ion: 1.02 mmol/L — ABNORMAL LOW (ref 1.12–1.32)
Chloride: 96 mEq/L (ref 96–112)
HCT: 43 % (ref 36.0–46.0)
Hemoglobin: 14.6 g/dL (ref 12.0–15.0)
Potassium: 3.4 mEq/L — ABNORMAL LOW (ref 3.5–5.1)

## 2010-08-03 LAB — PROTIME-INR
INR: 1.04 (ref 0.00–1.49)
INR: 2.12 — ABNORMAL HIGH (ref 0.00–1.49)
Prothrombin Time: 13.8 seconds (ref 11.6–15.2)

## 2010-08-03 LAB — POCT CARDIAC MARKERS: Myoglobin, poc: 340 ng/mL (ref 12–200)

## 2010-08-03 LAB — CK TOTAL AND CKMB (NOT AT ARMC)
CK, MB: 2.1 ng/mL (ref 0.3–4.0)
Relative Index: INVALID (ref 0.0–2.5)

## 2010-08-03 LAB — APTT: aPTT: 28 seconds (ref 24–37)

## 2010-08-03 NOTE — Telephone Encounter (Signed)
Pt husband calling to info pt is in [place] rm# [phone]

## 2010-08-03 NOTE — Telephone Encounter (Signed)
I spoke with the pt's husband and made him aware that Dr Excell Seltzer is currently out of the office on vacation. I made him aware that if a Cardiology consult was requested then one of the other [facility] Cardiologist would be seeing the pt.

## 2010-08-03 NOTE — Progress Notes (Addendum)
Hospital Admission Note Date: 08/03/2010  Patient name:  [name]  Medical record number:  [id number] Date of birth:  [birth year]   Age: 75 y.o. Gender:  female PCP:    Ulyess Mort, MD  Medical Service:   Internal Medicine Teaching Service   Attending physician:  Dr. Blanch Media First Contact:   Dr. Dorthula Rue  Pager: [phone]  Second Contact:   Dr. Baltazar Apo  Pager: [phone] After Hours:    First Contact  Pager: [phone]      Second Contact  Pager: [phone]   Chief Complaint:Palpitations  History of Present Illness: Patient is a 75 y.o. female with a PMHx of ESRD on HD (Tues, Thurs, Sun), HTN, NSTEMI (09/2009), paroxysmal atrial fibrillation on chronic coumadin therapy, and AOCD who presented to [facility] for evaluation of palpitations that began on the morning of admission following her routine dialysis session, and have been intermittent since that time. Palpitations were not associated with chest pain, shortness of breath, presyncopal or syncopal episodes, fevers, chills, nausea, vomiting, dizziness, weakness, lightheadedness, urinary changes, or leg swelling. Patient was not abundantly hypertensive during her dialysis session. Of note the patient was recently hospitalized from 07/16/2010 07/18/2010 with the same presentation at which time she was newly diagnosed with hyperthyroidism, and was discharged home with methimazole therapy. However patient has been noncompliant with this medication secondary to mild side effects of abdominal upset. She was seen in the outpatient clinic by Dr. Aundria Rud on 07/29/2010 at which time she was recommended PTU therapy, which the patient has not yet begun as she is waiting for her endocrinology followup visit with Dr. Lucianne Muss prior to initiation of therapy. Additionally, the patient is on chronic Coumadin therapy in the setting of paroxysmal atrial fibrillation, however on 08/01/2010 she was found to be supratherapeutic of her Coumadin with an INR of 4.2, at which  time she was recommended to hold her Coumadin times one day and then resume home dose. However the patient held her Coumadin for 2 days secondary to recurrence of palpitations.  Current Outpatient Medications: Medication Sig  . atenolol (TENORMIN) 25 MG tablet Take 1 tablet (25 mg total) by mouth daily.  . Calcium Acetate 667 MG TABS Take by mouth 3 (three) times daily before meals.    . nitroGLYCERIN (NITROSTAT) 0.4 MG SL tablet Place 0.4 mg under the tongue every 5 (five) minutes as needed.    . propylthiouracil (PTU) 50 MG tablet Take 1 tablet (50 mg total) by mouth 3 (three) times daily.  . ranitidine (ZANTAC) 150 MG tablet Take 150 mg by mouth daily.    Marland Kitchen warfarin (COUMADIN) 5 MG tablet Take 1 tablet (5 mg total) by mouth as directed.    ** Of note, pt has not yet started PTU, as it was just prescribed by Dr. Aundria Rud 07/29/2010.  Allergies: Allergen Reactions  . Cardizem (Diltiazem Hcl) Hives  . Codeine   . Iron     ?? Almost died.  . Venofer (Iron Sucrose (Iron Oxide Saccharated)) Itching    Past Medical History: Diagnosis Date  . ESRD (end stage renal disease)     HD on Tues, Thurs, Sat // Likely secondary to nephrosclerosis from HTN  . HTN (hypertension)   . HLD (hyperlipidemia)   . Cholelithiases     S/P biliary colic cholecystostomy, but not cholecystectomy secondary to omental adhesions  . Anxiety   . Anemia     BL Hgb 8-9. Likely AOCD in setting of ESRD with iron 48, ferritin  746 (03/2010)  .  PAF (paroxysmal atrial fibrillation)     On chronic coumadin therapy // Followed by Dr. Excell Seltzer (cardiology)  . Non-ST elevation MI (NSTEMI)     Noninvasive evaluation with Myoview negative for ischemia.  // 2-D echo (05/2010) LVEF 65-70%. Grade 1 diastolic dysfunction.  Peak PA pressure 52.  Marland Kitchen Hyperthyroidism DX: 07/2010    Likely Grave's Disease. // On diagnosis, TSH < 0.08, free T4 1.94, free T3 5.3 . //  Thyroid US (07/17/2010) - nonspecific diffuse heterogenous thyroid  gland. //  Thyrotropin receptor antibody neg (07/17/2010)    Past Surgical History: Procedure Date  . Other surgical history 02/2009    Cholecystostomy secondary to biliary colic - NO cholecystectomy secondary to omental adhesions - by Dr. Bertram Savin    Family History: Problem Relation Age of Onset  . Stroke    . Heart attack      Social History: Social History  . Marital Status: Married    Number of Children: 0  . Years of Education: 12 th grad   Social History Main Topics  . Smoking status: Never Smoker   . Alcohol Use: No  . Drug Use: No   Other Topics Concern  . Insurance - Medicaid    Review of Systems: Pertinent items are noted in HPI.  Vital Signs: T: 97 P: 128 BP: 134/71 RR: 18 O2 sat: 96% on RA   Physical Exam: General: Vital signs reviewed and noted. Well-developed, well-nourished, in no acute distress; alert, appropriate and cooperative throughout examination.  Head: Normocephalic, atraumatic.  Nose: Mucous membranes moist, not inflammed, nonerythematous.  Throat: Oropharynx nonerythematous, no exudate appreciated.   Neck: No deformities, masses, or tenderness noted.Supple, No carotid Bruits, no JVD.  Lungs:  Normal respiratory effort. Clear to auscultation BL without crackles or wheezes.  Heart: Irregularly irregular. S1 and S2 normal without gallop, murmur, or rubs.  Abdomen:  BS normoactive. Soft, Nondistended, non-tender.  No masses or organomegaly.  Extremities: No pretibial edema.  Neurologic: A&O X3, CN II - XII are grossly intact. Motor strength is 5/5 in the all 4 extremities.  Skin: No visible rashes, scars.   Lab results: CBC: WBC                                      7.6               4.0-10.5         K/uL RBC                                      4.95              3.87-5.11        MIL/uL Hemoglobin (HGB)                         13.5              12.0-15.0        g/dL Hematocrit (HCT)                         43.2              36.0-46.0         % MCV  87.3              78.0-100.0       fL MCH -                                    27.3              26.0-34.0        pg MCHC                                     31.3              30.0-36.0        g/dL RDW                                      16.3       h      11.5-15.5        % Platelet Count (PLT)                     404        h      150-400          K/uL  ISTAT Chem 8: TCO2                                     35                0-100            mmol/L Ionized Calcium                          1.02       l      1.12-1.32        mmol/L Hemoglobin (HGB)                         14.6              12.0-15.0        g/dL Hematocrit (HCT)                         43.0              36.0-46.0        % Sodium (NA)                              138               135-145          mEq/L Potassium (K)                            3.4        l      3.5-5.1          mEq/L Chloride  96                96-112           mEq/L Glucose                                  135        h      70-99            mg/dL BUN                                      19                6-23             mg/dL Creatinine                               3.80       h      0.4-1.2          mg/dL  Coagulation Studies: Protime ( Prothrombin Time)              13.8              11.6-15.2        seconds INR                                      1.04              0.00-1.49 PTT(a-Partial Thromboplastn Time)        28                24-37            Seconds  Cardiac Enzymes: CKMB, POC                                1.4               1.0-8.0          ng/mL Troponin I, POC                          <0.05             0.00-0.09        ng/mL Myoglobin, POC                           340        H      12-200           ng/mL  TSH - pending  Imaging results:  None.  Assessment & Plan: 1) Palpitations - Symptoms likely secondary to atrial fibrillation that is not rate controlled on  admission, in the setting of untreated hyperthyroidism. Symptoms improved by time of evaluation. Patient received Metoprolol 500mg  push x1 during ED course. Patient has previously had intolerance to Cardizem, which causes itching and hives - therefore, it was not used. Patient notes improvement, but not resolution of symptoms during evaluation, however, her heart rates remains in 120s bpm.  No significant electrolyte disturbances, fevers, or evidence of significant anemia  to contribute towards presenting symptoms.  - Observe in telemetry unit. - Will start oral metoprolol 100mg  TID for goal HR < 100 bpm. - TSH pending. - Check cardiac enzymes x 3. - Monitor electrolytes, CBC.   2) Paroxysmal atrial fibrillation - currently subtherapeutic of coumadin, in setting of missed doses x 2 days. The patient has multiple risk factors for atrial fibrillation including untreated hyperthyroidism, chronic kidney disease, and hypertension. Last 2-D echocardiogram in March 2012 indicated no systolic or diastolic dysfunction, and patient appears euvolemic at this time. - Resume coumadin, per pharmacy. - Rate control as specified above.  3) Hyperthyroidism - patient newly diagnosed during last admission with diagnosis TSH < 0.08, free T4 1.94, free T3 5.3. Thyroid ultrasound indicating heterogenous thyroid enlargement, thought likely to reflect Grave's disease. Patient noncompliant with her discharge Methimazole secondary to abdominal upset, was written for PTU by her PCP on 07/29/10, however, did not start this medication.  - Consider endocrinology consult inpatient (if atrial fibrillation continues not to be rate controlled, thereby requiring escalation of therapy, etc) versus outpatient follow-up. Pt already has outpatient f/u with Dr. Lucianne Muss.  - Will defer initiation of PTU therapy to primary team, with concern that patient has had multiple intolerances to medications in the past (per PCP records, intolerances to over  30 antihypertensive medications, intolerance to multiple classes of medications, intolerance to Methimazole), with noncompliance secondary to side effects. Therefore, may be reasonable to consider radioiodine ablative therapy in setting of single application and treatment. Thyroid surgery also a consideration, although less likely choice secondary to advanced age, risk of surgery, comorbidities, etc. May benefit from input of endocrinology. - TSH pending.  4) ESRD on HD - Last dialysis session on 08/02/10. Tues, Thurs, Saturday schedule. No evidence of uremia, electrolytes at baseline.  - Consider renal consult for hemodialysis if patient continues to be hospitalized, to maintain per Tues, Thurs, Sat schedule.   5) HTN - stable at this time.  - Hold home Atenolol in setting of Metoprolol treatment of #1. - Once rate controlled, consider resume home med with escalation of dose as indicated versus change to Metoprolol.   6) DVT PPX - Heparin      Johnette Abraham, D.O. (PGY1):  ____________________________________    Date/ Time:      ____________________________________     Lars Mage, M.D. (PGY2):    ____________________________________    Date/ Time:      ____________________________________     I have seen and examined the patient. I reviewed the resident/fellow note and agree with the findings and plan of care as documented. My additions and revisions are included.    Signature:  ____________________________________________     Internal Medicine Teaching Service Attending    Date:    ____________________________________________

## 2010-08-04 ENCOUNTER — Encounter (HOSPITAL_COMMUNITY): Payer: Medicaid Other

## 2010-08-04 ENCOUNTER — Inpatient Hospital Stay (HOSPITAL_COMMUNITY): Payer: Medicaid Other

## 2010-08-04 ENCOUNTER — Encounter: Payer: Self-pay | Admitting: Internal Medicine

## 2010-08-04 DIAGNOSIS — I4891 Unspecified atrial fibrillation: Secondary | ICD-10-CM

## 2010-08-04 DIAGNOSIS — E0590 Thyrotoxicosis, unspecified without thyrotoxic crisis or storm: Secondary | ICD-10-CM

## 2010-08-04 LAB — PROTIME-INR
INR: 1.75 — ABNORMAL HIGH (ref 0.00–1.49)
Prothrombin Time: 20.6 seconds — ABNORMAL HIGH (ref 11.6–15.2)

## 2010-08-04 LAB — RENAL FUNCTION PANEL
Albumin: 2.6 g/dL — ABNORMAL LOW (ref 3.5–5.2)
BUN: 30 mg/dL — ABNORMAL HIGH (ref 6–23)
CO2: 32 meq/L (ref 19–32)
Calcium: 9.4 mg/dL (ref 8.4–10.5)
Chloride: 99 meq/L (ref 96–112)
Creatinine, Ser: 5.79 mg/dL — ABNORMAL HIGH (ref 0.4–1.2)
GFR calc non Af Amer: 7 mL/min — ABNORMAL LOW
Glucose, Bld: 112 mg/dL — ABNORMAL HIGH (ref 70–99)
Phosphorus: 5 mg/dL — ABNORMAL HIGH (ref 2.3–4.6)
Potassium: 3.4 meq/L — ABNORMAL LOW (ref 3.5–5.1)
Sodium: 141 meq/L (ref 135–145)

## 2010-08-04 LAB — CBC
MCHC: 30.7 g/dL (ref 30.0–36.0)
RDW: 16.2 % — ABNORMAL HIGH (ref 11.5–15.5)
WBC: 6.6 10*3/uL (ref 4.0–10.5)

## 2010-08-04 LAB — HEPATITIS B SURFACE ANTIGEN: Hepatitis B Surface Ag: NEGATIVE

## 2010-08-04 LAB — TSH: TSH: 0.01 u[IU]/mL — ABNORMAL LOW (ref 0.350–4.500)

## 2010-08-04 MED ORDER — SODIUM IODIDE I 131 CAPSULE
9.0000 | Freq: Once | INTRAVENOUS | Status: AC | PRN
  Administered 2010-08-04: 9 via ORAL

## 2010-08-04 MED ORDER — SODIUM PERTECHNETATE TC 99M INJECTION
10.0000 | Freq: Once | INTRAVENOUS | Status: AC | PRN
  Administered 2010-08-04: 10 via INTRAVENOUS

## 2010-08-10 ENCOUNTER — Encounter: Payer: Medicaid Other | Admitting: *Deleted

## 2010-08-11 ENCOUNTER — Telehealth: Payer: Self-pay | Admitting: Cardiovascular Disease

## 2010-08-11 ENCOUNTER — Ambulatory Visit: Payer: Medicaid Other | Admitting: Internal Medicine

## 2010-08-11 ENCOUNTER — Inpatient Hospital Stay (HOSPITAL_COMMUNITY): Payer: Medicaid Other

## 2010-08-11 ENCOUNTER — Inpatient Hospital Stay (HOSPITAL_COMMUNITY)
Admission: EM | Admit: 2010-08-11 | Discharge: 2010-08-16 | DRG: 308 | Disposition: A | Discharge status: Home / Self Care | Payer: Medicaid Other | Attending: Internal Medicine | Admitting: Internal Medicine

## 2010-08-11 ENCOUNTER — Encounter: Payer: Self-pay | Admitting: Internal Medicine

## 2010-08-11 DIAGNOSIS — I4891 Unspecified atrial fibrillation: Principal | ICD-10-CM | POA: Diagnosis present

## 2010-08-11 DIAGNOSIS — D649 Anemia, unspecified: Secondary | ICD-10-CM | POA: Diagnosis present

## 2010-08-11 DIAGNOSIS — I2510 Atherosclerotic heart disease of native coronary artery without angina pectoris: Secondary | ICD-10-CM | POA: Diagnosis present

## 2010-08-11 DIAGNOSIS — E039 Hypothyroidism, unspecified: Secondary | ICD-10-CM | POA: Diagnosis present

## 2010-08-11 DIAGNOSIS — I252 Old myocardial infarction: Secondary | ICD-10-CM

## 2010-08-11 DIAGNOSIS — Z992 Dependence on renal dialysis: Secondary | ICD-10-CM

## 2010-08-11 DIAGNOSIS — N2581 Secondary hyperparathyroidism of renal origin: Secondary | ICD-10-CM | POA: Diagnosis present

## 2010-08-11 DIAGNOSIS — N186 End stage renal disease: Secondary | ICD-10-CM | POA: Diagnosis present

## 2010-08-11 DIAGNOSIS — I495 Sick sinus syndrome: Secondary | ICD-10-CM

## 2010-08-11 DIAGNOSIS — I120 Hypertensive chronic kidney disease with stage 5 chronic kidney disease or end stage renal disease: Secondary | ICD-10-CM | POA: Diagnosis present

## 2010-08-11 DIAGNOSIS — I4892 Unspecified atrial flutter: Secondary | ICD-10-CM | POA: Diagnosis present

## 2010-08-11 DIAGNOSIS — Z7901 Long term (current) use of anticoagulants: Secondary | ICD-10-CM

## 2010-08-11 LAB — BASIC METABOLIC PANEL
Calcium: 9.5 mg/dL (ref 8.4–10.5)
GFR calc Af Amer: 11 mL/min — ABNORMAL LOW (ref >60)
GFR calc non Af Amer: 9 mL/min — ABNORMAL LOW (ref >60)
Glucose, Bld: 129 mg/dL — ABNORMAL HIGH (ref 70–99)
Sodium: 139 mEq/L (ref 135–145)

## 2010-08-11 LAB — TROPONIN I: Troponin I: <0.3 ng/mL (ref <0.30)

## 2010-08-11 LAB — CBC
MCV: 85.8 fL (ref 78.0–100.0)
Platelets: 253 10*3/uL (ref 150–400)
RDW: 16.7 % — ABNORMAL HIGH (ref 11.5–15.5)
WBC: 8.7 10*3/uL (ref 4.0–10.5)

## 2010-08-11 LAB — CARDIAC PANEL(CRET KIN+CKTOT+MB+TROPI)
Relative Index: INVALID (ref 0.0–2.5)
Total CK: 23 U/L (ref 7–177)

## 2010-08-11 LAB — DIFFERENTIAL
Basophils Absolute: 0 10*3/uL (ref 0.0–0.1)
Basophils Relative: 1 % (ref 0–1)
Eosinophils Absolute: 0.8 10*3/uL — ABNORMAL HIGH (ref 0.0–0.7)
Eosinophils Relative: 9 % — ABNORMAL HIGH (ref 0–5)
Neutrophils Relative %: 69 % (ref 43–77)

## 2010-08-11 NOTE — Progress Notes (Signed)
Hospital Admission Note Date: 08/11/2010  Patient name:  [name]  Medical record number:  [id number] Date of birth:  1935/12/28   Age: 75 y.o. Gender:  female PCP:    Ulyess Mort, MD  Medical Service:   Internal Medicine Teaching Service   Attending physician:  Dr. Mariea Stable First Contact:   Dr. Loistine Chance  Pager: [phone]  Second Contact:   Dr. Arvilla Market  Pager: [phone] After Hours:    First Contact  Pager: [phone]      Second Contact  Pager: [phone]   Chief Complaint: Palpitations.  History of Present Illness: Patient is a 75 y.o. female with a PMHx of ESRD on HD (Tues, Thurs, Sun), HTN, NSTEMI (09/2009), paroxysmal atrial fibrillation on chronic coumadin therapy, and AOCD who presented to [facility] for evaluation of palpitations that began on the evening prior to admission and have been intermittent since that time. Palpitations are also associated with shortness of breath, and mild diffuse chest pain, described as "someone punched me in my chest", alleviated after receiving oxygen in the ER, aggravated with inspiration. Patient indicates she has had this same chest pain intermittently since starting the PTU at last hospitalization 08/04/2010. Palpitations were not associated with presyncopal or syncopal episodes, fevers, chills, nausea, vomiting, dizziness, weakness, lightheadedness, urinary changes, or leg swelling. During last hospital course, patient was admitted with same presentation thought secondary to being noncompliant with her methimazole treatment secondary to intolerance - she was at that time changed to PTU, with which she states she is having similar side effects of chest pain, shortness of breath, abdominal pain. However, despite these symptoms, patient has continued to take the medication. Also, during last admission, pt was noted to have bradycardia to the 50s beats per minutes following metoprolol pushes in the ER, secondary to these bradycardic episodes, the patient was  stopped of her atenolol on discharge. Patient was recommended to follow-up with outpatient cardiology for Holter monitor testing.    Current Outpatient Medications: Medication Sig  . cycloSPORINE (RESTASIS) 0.05 % ophthalmic emulsion Place 1 drop into both eyes 2 (two) times daily.    . Calcium Acetate 667 MG TABS Take by mouth 3 (three) times daily before meals.    . nitroGLYCERIN (NITROSTAT) 0.4 MG SL tablet Place 0.4 mg under the tongue every 5 (five) minutes as needed.    . propylthiouracil (PTU) 50 MG tablet Take 1 tablet (50 mg total) by mouth 3 (three) times daily.  . ranitidine (ZANTAC) 150 MG tablet Take 150 mg by mouth daily.    Marland Kitchen warfarin (COUMADIN) 5 MG tablet Take 1 tablet (5 mg total) by mouth as directed.   Allergies: Cardizem; Codeine; Iron; and Venofer  Past Medical History: Diagnosis Date  . ESRD (end stage renal disease)     HD on Tues, Thurs, Sat // Likely secondary to nephrosclerosis from HTN  . HTN (hypertension)   . HLD (hyperlipidemia)   . Cholelithiases     S/P biliary colic cholecystostomy, but not cholecystectomy secondary to omental adhesions  . Anxiety   . Anemia     BL Hgb 8-9. Likely AOCD in setting of ESRD with iron 48, ferritin  746 (03/2010)  . PAF (paroxysmal atrial fibrillation)     On chronic coumadin therapy // Followed by Dr. Excell Seltzer (cardiology)  . Non-ST elevation MI (NSTEMI)     Noninvasive evaluation with Myoview negative for ischemia.  // 2-D echo (05/2010) LVEF 65-70%. Grade 1 diastolic dysfunction.  Peak PA pressure 52.  Marland Kitchen Hyperthyroidism DX:  07/2010    Likely Grave's Disease. // On diagnosis, TSH < 0.08, free T4 1.94, free T3 5.3 . //  Thyroid US (07/17/2010) - nonspecific diffuse heterogenous thyroid gland. //  Thyrotropin receptor antibody neg (07/17/2010)    Past Surgical History: Procedure Date  . Other surgical history 02/2009    Cholecystostomy secondary to biliary colic - NO cholecystectomy secondary to omental adhesions - by Dr.  Bertram Savin    Family History: Problem Relation Age of Onset  . Stroke    . Heart attack      Social History: Social History  . Marital Status: Married    Spouse Name: N/A    Number of Children: 0  . Years of Education: 60 th grad   Occupational History  .     Social History Main Topics  . Smoking status: Never Smoker   . Alcohol Use: No  . Drug Use: No    Review of Systems: Pertinent items are noted in HPI.  Vital Signs: T: 98 P: 120 BP: 141/81 RR: 16 O2 sat: 98% on RA   Physical Exam: General: Vital signs reviewed and noted. Well-developed, well-nourished, in no acute distress; alert, appropriate and cooperative throughout examination.  Head: Normocephalic, atraumatic.  Eyes: PERRL, EOMI, No signs of anemia or jaundince.  Nose: Mucous membranes moist, not inflammed, nonerythematous.  Throat: Oropharynx nonerythematous, no exudate appreciated.   Neck: No deformities, masses, or tenderness noted.Supple, No carotid Bruits, no JVD.  Lungs:  Normal respiratory effort. Clear to auscultation BL without crackles or wheezes.  Heart: Irregularly irregular rate and rhythm. S1 and S2 normal without gallop, murmur, or rubs.  Abdomen:  BS normoactive. Soft, Nondistended, non-tender.  No masses or organomegaly.  Extremities: No pretibial edema.  Neurologic: A&O X3, CN II - XII are grossly intact. Motor strength is 5/5 in the all 4 extremities.  Skin: No visible rashes, scars.   Lab results: CBC: Component Value  WBC 8.7  HGB 11.6*  HCT 37.0  PLT 253  MCV 85.8  NEUTROABS 6.0  LYMPHSABS 1.1  MONOABS 0.8  EOSABS 0.8*  BASOSABS 0.0   Comprehensive Metabolic Panel: Component Value  NA 139  K 3.5  CL 98  CO2 30  BUN 35*  CREATININE 4.64*  GLUCOSE 129*  CALCIUM 9.5   Cardiac Enzymes: Creatine Kinase, Total                   56                7-177            U/L Troponin I                               <0.30             <0.30            ng/mL   Imaging results:    None.  Assessment & Plan: 1) Palpitations - Symptoms likely secondary to atrial fibrillation that is not rate controlled on admission, in the setting of untreated hyperthyroidism. Symptoms improved by time of evaluation. Patient received Metoprolol 5mg  IV push x 2 and Diltaizem 100mg  during ED course (which was stopped by ER resident after realization of prior intolerance to Cardizem, which causes itching and hives). Patient notes improvement, but not resolution of symptoms during evaluation, however, her heart rates remains in 120s bpm. No significant electrolyte disturbances, fevers,  or evidence of significant anemia to contribute towards presenting symptoms.  - Observe in telemetry unit.  - Will start home Atenolol 25 mg po, Metoprolol 5mg  IV push PRN for HR > 110 bpm. - Check cardiac enzymes x 3, EKG in AM. - Monitor electrolytes, CBC.   2) Paroxysmal atrial fibrillation - on chronic coumadin therapy, PT/INR pending. The patient has multiple risk factors for atrial fibrillation including untreated hyperthyroidism, chronic kidney disease, and hypertension. Last 2-D echocardiogram in March 2012 indicated no systolic or diastolic dysfunction, and patient appears euvolemic at this time.  - Resume coumadin, per pharmacy.  - Rate control as specified above.  - Monitor PT/INR.  3) Hyperthyroidism - patient newly diagnosed in 07/2010 with diagnosis TSH < 0.08, free T4 1.94, free T3 5.3. TSH was < 0.01 on 08/03/10. Radioactive iodine uptake scan on Aug 04, 2010 shows homogeneous uptake of thyroid gland consistent with Graves disease. Patient continuing PTU therapy, but reports multiple side effects including chest pain, shortness of breath, and abdominal upset with this medication.   - Consider endocrinology consult inpatient (if atrial fibrillation continues not to be rate controlled, thereby requiring escalation of therapy, etc) versus outpatient follow-up. Pt already has outpatient f/u with Dr. Lucianne Muss  on September 12, 2010.  - Resume PTU at this time, however, with history of medication noncompliance secondary to numerous drug intolerances, patient may not be able to tolerate long term. Per PCP records, it seems that she has intolerances to over 30 antihypertensive medications, intolerance to multiple classes of medications, intolerance to Methimazole, with noncompliance secondary to side effects. Therefore, may be reasonable to consider radioiodine ablative therapy in setting of single application and treatment. Thyroid surgery also a consideration, although less likely choice secondary to advanced age, risk of surgery, comorbidities, etc. May benefit from input of endocrinology.   4) ESRD on HD - Tues, Thurs, Saturday schedule. No evidence of uremia, electrolytes at baseline.  - Renal consult to maintain patient per routine schedule.   5) HTN - stable at this time.  - Continue home Atenolol, PRN Metoprolol for HR > 110 bpm.   6) DVT PPX - Coumadin     Johnette Abraham, D.O. (PGY1):  ____________________________________    Date/ Time:      ____________________________________     Lars Mage, M.D. (PGY2):    ____________________________________    Date/ Time:      ____________________________________     I have seen and examined the patient. I reviewed the resident/fellow note and agree with the findings and plan of care as documented. My additions and revisions are included.   Signature:  ____________________________________________     Internal Medicine Teaching Service Attending    Date:    ____________________________________________

## 2010-08-11 NOTE — Telephone Encounter (Signed)
RN s/w Spouse, Pt is in the hospital - Room 3705. 08/12/2010 Appt will be cancelled.

## 2010-08-11 NOTE — Telephone Encounter (Signed)
Pt spouse said call him wouldn't tell me what it is about

## 2010-08-12 ENCOUNTER — Encounter: Payer: Medicare Other | Admitting: Cardiovascular Disease

## 2010-08-12 DIAGNOSIS — I4891 Unspecified atrial fibrillation: Secondary | ICD-10-CM

## 2010-08-12 LAB — PROTIME-INR: INR: 1.98 — ABNORMAL HIGH (ref 0.00–1.49)

## 2010-08-12 LAB — BASIC METABOLIC PANEL
Calcium: 9.4 mg/dL (ref 8.4–10.5)
GFR calc Af Amer: 19 mL/min — ABNORMAL LOW (ref >60)
GFR calc non Af Amer: 16 mL/min — ABNORMAL LOW (ref >60)
Sodium: 140 mEq/L (ref 135–145)

## 2010-08-12 LAB — T4, FREE: Free T4: 1.71 ng/dL (ref 0.80–1.80)

## 2010-08-12 NOTE — Discharge Summary (Signed)
[name], [name] NO.:  [id number]  MEDICAL RECORD NO.:  [id number]           PATIENT TYPE:  I  LOCATION:  3735                         FACILITY:  [facility]  PHYSICIAN:  Mariea Stable, MD   DATE OF BIRTH:  [birth year]  DATE OF ADMISSION:  08/03/2010 DATE OF DISCHARGE:  08/04/2010                              DISCHARGE SUMMARY   DISCHARGE DIAGNOSES: 1. Paroxysmal atrial fibrillation-flutter, on Coumadin (followed by Dr.     Excell Seltzer, Cardiology). 2. Hyperthyroidism:  most likely Graves disease (but Hashitoxicosis also possible) 3. 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 4. Tachy-Brady syndrome given #1 and #3 4. End-stage renal disease, on hemodialysis Tuesday, Thursday, and     Saturday. 5. Hypertension. 6. Hyperlipidemia. 7. Anemia of chronic disease.  Baseline hemoglobin 8-9, likely in the     setting of end-stage renal disease with iron of 48, ferritin 740,     March 28, 2010. 8. History of non-ST-elevation myocardial infarction in July 2011, in     basic evaluation with Myoview negative for ischemia.  A 2-D echo in     March 2012 showed an ejection fraction of 65-70% with grade 1     diastolic dysfunction.  Peak PA pressure of 52. 9. Anxiety. 10.Hyperthyroidism.  TSH undetectable at less than 0.08, elevated free T4 1.94 and T3 5.3.     Thyroid ultrasound on Jul 17, 2010 showed nonspecific diffuse     heterogeneous thyroid gland.  Thyrotropin receptor antibody     negative on Jul 17, 2010.  The iodine uptake on Aug 04, 2010 shows     homogeneous uptake of thyroid gland consistent with Graves disease (or less likely Hashitoxicosis).  PAST SURGICAL HISTORY:  Cholecystectomy secondary to omental adhesion by Dr. Bertram Savin.  DISCHARGE MEDICATIONS: 1. Propylthiouracil 50 mg p.o. 3 times a day. 2. Ranitidine 150 mg 1 tablet p.o. daily at bedtime. 3. Cyclosporin ophthalmic 1  drop in both eyes b.i.d. 4. Warfarin 5 mg 1 tablet p.o. every evening. 5. Calcium acetate 667 mg 1 capsule p.o. 3 times a day between meals.  MEDICATIONS STOPPED DURING THIS HOSPITALIZATION:  Atenolol due to significant bradycardia.  DISPOSITION AND FOLLOWUP:  The patient was discharged in stable condition.  No futher episode of palpitation was noted.  The patient will follow up with Dr. Claudette Laws at the [facility]patient [facility] on Aug 11, 2010 for hospital followup.  During this office visit, please evaluate the patient's symptoms and assess the patient is taking all her medication. Furthermore, the patient will follow up with Dr. Excell Seltzer, Cardiology on August 12, 2010 at 9:00 a.m.  The patient will follow up with Dr. Lucianne Muss of Endocrinology on September 13, 2010 at 11:30 a.m. to establish care with an endocrinologist.  The patient will receive an event monitor from Cardiology for 2 weeks to monitor the patient's heart rhythm and rate.  Based on results, should follow up with EP and may need to consider pacemaker placement at some point (patient is on hemodyalisis which increases likelyhood of developing device infection in  future).  PROCEDURES PERFORMED:  Thyroid uptake 24 hours, impression:  Homogeneous uptake of thyroid gland.  Slight asymmetry in size from left to right. Normal on present ultrasound.  A 24-hour I-131 uptake was within normal limits.  BRIEF ADMISSION HISTORY AND PHYSICAL:  The patient is a 75 year old female with past history significant for end-stage renal, on hemodialysis Tuesday, Thursday, Sunday; hypertension, non-STEMI in July 2011, paroxysmal atrial fibrillation on chronic Coumadin, newly diagnosed hyperthyroidism, who presented to [facility] for evaluation of palpitation that began on the morning of admission following her routine dialysis session, have been intermittent since that time.  Palpitations were not associated with chest pain, shortness of breath, presyncopal  or syncopal episode, fevers, chills, nausea, vomiting, dizziness, weakness, lightheadedness, and no new changes of leg swelling.  The patient was not evidently hypertensive during her hospital session.  Of note, the patient was recently hospitalized from Jul 16, 2010 to May 7. 2012 with the same presentation at which time she was newly diagnosed with hyperthyroidism and was discharged home on methimazole therapy, however, the patient has been noncompliant with this medication secondary to mild side-effects of that.  She was seen in the outpatient clinic by Dr. Aundria Rud on Jul 29, 2010 at which time she was recommended PTU therapy, which the patient has not yet begun as she is waiting for endocrinology followup with Dr. Lucianne Muss prior to initiation of therapy.  Additionally, the patient is on chronic Coumadin therapy in the setting of her paroxysmal atrial fibrillation.  On Aug 01, 2010, she was found to be supratherapeutic of her Coumadin with an iron of 4.2, at which time she was recommended to hold Coumadin x1 day and then resume home dose. However, the patient held the Coumadin for 2 days secondary to recurrence of palpitations.  PHYSICAL EXAMINATION:  VITAL SIGNS:  Temperature 97, pulse 128, blood pressure 134/71, respiratory rate 18, and saturation 96% on room air. GENERAL:  Reviewed and noted.  Well developed, well nourished, in no acute distress, alert, and cooperated throughout the examination. HEAD:  Normocephalic. NECK:  No deformities and supple. LUNGS:  Normal respiratory effort.  Clear to auscultation bilaterally without crackles or wheezes. HEART:  Irregularly irregular rhythm.  S1-S2 normal without gallop, murmur, or rub. ABDOMEN:  Bowel sounds normoactive.  Soft, nondistended, and nontender. No masses or organomegaly. EXTREMITIES:  No pretibial edema. NEUROLOGIC:  Alert and oriented x3.  Cranial nerves II-XII are grossly intact.  Motor strength is 5/5 in all 4  extremities. SKIN:  No visible rashes or scars.  LABORATORY DATA:  CBC:  WBC 7.6, hemoglobin 13.5, hematocrit 43.2, and platelets 404.  I-STAT Chem-8:  PCO2 35, ionized calcium 1.32. Hemoglobin 14.6, hematocrit 43.  Sodium 138, potassium 3.4, chloride 96, glucose 135, BUN 19, and creatinine 0.8.  PT 13.8, INR 1.04, and PTT 28. Point-of-care markers:  CK-MB 1.4, troponin I less than 0.45, and myoglobin 340.  HOSPITAL COURSE: 1. Palpitations secondary to A fib with RVR.  Recieved metoprolol 5mg  push x 1 in ED and spontaneously converted subsequently.  Atenolol was changed to metoprolol upon admission but then changed back to atenolol at slightly higher dose (25mg  increased to  50mg ).  Given wandering pacemaker noted on tele and junctional rhythm during hemodyalisis, AV nodal blocking agents stopped prior to discharge.  No significant electrolyte disturbance, fevers, or     evidence of significant edema or contraindicating symptoms.     Cardiac enzymes were negative x3.  Electrolytes were within normal  limits. 2. The patient had episodes of a wandering pacemaker with rates in the     50s on tele and the patient also had junctional rhythm during     hemodialysis with heart rates in 40s.  The patient during this     time-frame was asymptomatic.  The patient will have an event     monitor placed for 2 weeks and will follow up with Cardiology and     EPS as an outpatient.  Atenolol was held discharge due to concern for tachy-brady syndrome. 3. Paroxysmal atrial fibrillation.  The patient on admission was     supratherapeutic on Coumadin with setting of missed doses x2.  The     patient has multiple risk factors for atrial fibrillation,     untreated hypothermia, chronic kidney disease, hyperthyroidism, and     hypertension.  Last 2-D echo in March 2012 indicated no specific     diastolic dysfunction.  The patient appears euvolemic at this time.     The patient's Coumadin was held during  hospital admission and the     patient underwent thyroid iodine uptake but Coumadin was restarted     on day of discharge.  The patient will follow up for regular INR     checks with Dr. Excell Seltzer on Tuesday, Aug 09, 2010.      Atenolol was held during this hospital admission due bradycardia.  4. Hyperthyroidism.  The patient was newly diagnosed during last     admission.  TSH was less than 0.08, free T4 1.94,     free T3 of 5.3.  Thyroid ultrasound indicates heterogenous thyroid     enlargement thought likely to reflect Graves disease.  Iodine-I     uptake during this hospital admission showed homogeneous uptake of     thyroid gland which is consistent with Graves disease.  The     patient was noncompliant with her methimazole secondary to     abdominal upset and was started on PTU by her PCP on Jul 29, 2010.     The patient did not take this medication but on day of discharge,     this was continued and the patient was advised to definitely take     the medication.  She was informed about problems with     noncompliance.  The patient will follow up with Dr. Lucianne Muss on September 13, 2010.  No earlier appointments were available. 5. End-stage renal disease, on hemodialysis, on Tuesday,     Thursday, and Saturday.  No evidence of elevated electrolytes on     day of admission.  Renal was consulted for hemodialysis.  The     patient received hemodialysis on Aug 04, 2010. 6. Hypertension.  Blood pressure was slightly elevated during hospital     admission.  Blood pressure is normal and improved after receiving     hemodialysis.  Atenolol had to be discontinued due to significant     bradycardia.  DISCHARGE VITAL SIGNS:  Temperature 97.2, pulse 50, respiratory rate 18, blood pressure 131/47 and saturating 98% on room air.  DISCHARGE LABORATORY DATA:  CBC:  WBC 6.6, hemoglobin 11, hematocrit 35.8, and platelets 297.  Renal panel:  Sodium 141, potassium 3.4, chloride 99, CO2 32, glucose 112, BUN  30, creatinine 5.79, albumin 2.6, calcium 9.4, and phosphorous 5.0.  This BMP was performed during hemodialysis.  Hepatitis B surface antigen negative.  PT 20.6 and INR 1.17.  It was pleasure  taking care of Ms. Manwarren.    ______________________________ Almyra Deforest, MD   ______________________________ Mariea Stable, MD    JI/MEDQ  D:  08/05/2010  T:  08/05/2010  Job:  [id number]  cc:   C. Ulyess Mort, M.D. Veverly Fells. Excell Seltzer, MD  Electronically Signed by Almyra Deforest MD on 08/10/2010 08:15:01 PM Electronically Signed by Mariea Stable MD on 08/12/2010 08:53:32 AM

## 2010-08-13 ENCOUNTER — Inpatient Hospital Stay (HOSPITAL_COMMUNITY): Payer: Medicaid Other

## 2010-08-13 DIAGNOSIS — I4891 Unspecified atrial fibrillation: Secondary | ICD-10-CM

## 2010-08-13 LAB — RENAL FUNCTION PANEL
BUN: 30 mg/dL — ABNORMAL HIGH (ref 6–23)
CO2: 28 mEq/L (ref 19–32)
Chloride: 101 mEq/L (ref 96–112)
Creatinine, Ser: 4.98 mg/dL — ABNORMAL HIGH (ref 0.4–1.2)
GFR calc Af Amer: 10 mL/min — ABNORMAL LOW (ref >60)
GFR calc non Af Amer: 9 mL/min — ABNORMAL LOW (ref >60)
Potassium: 4.1 mEq/L (ref 3.5–5.1)

## 2010-08-13 LAB — CBC
HCT: 35 % — ABNORMAL LOW (ref 36.0–46.0)
Hemoglobin: 10.8 g/dL — ABNORMAL LOW (ref 12.0–15.0)
MCHC: 30.9 g/dL (ref 30.0–36.0)
RBC: 4.12 MIL/uL (ref 3.87–5.11)

## 2010-08-14 LAB — CBC
HCT: 41.4 % (ref 36.0–46.0)
MCH: 26.4 pg (ref 26.0–34.0)
MCV: 85.5 fL (ref 78.0–100.0)
Platelets: 255 10*3/uL (ref 150–400)
RBC: 4.84 MIL/uL (ref 3.87–5.11)

## 2010-08-15 DIAGNOSIS — I4891 Unspecified atrial fibrillation: Secondary | ICD-10-CM

## 2010-08-15 LAB — RENAL FUNCTION PANEL
CO2: 34 mEq/L — ABNORMAL HIGH (ref 19–32)
Calcium: 9.7 mg/dL (ref 8.4–10.5)
Chloride: 98 mEq/L (ref 96–112)
GFR calc Af Amer: 15 mL/min — ABNORMAL LOW (ref >60)
GFR calc non Af Amer: 12 mL/min — ABNORMAL LOW (ref >60)
Glucose, Bld: 94 mg/dL (ref 70–99)
Potassium: 4.2 mEq/L (ref 3.5–5.1)
Sodium: 141 mEq/L (ref 135–145)

## 2010-08-15 LAB — PROTIME-INR
INR: 2.98 — ABNORMAL HIGH (ref 0.00–1.49)
Prothrombin Time: 31 seconds — ABNORMAL HIGH (ref 11.6–15.2)

## 2010-08-16 ENCOUNTER — Encounter (HOSPITAL_COMMUNITY)
Admission: RE | Admit: 2010-08-16 | Discharge: 2010-08-16 | Disposition: A | Discharge status: Home / Self Care | Payer: Medicaid Other | Source: Ambulatory Visit | Attending: Internal Medicine | Admitting: Internal Medicine

## 2010-08-16 ENCOUNTER — Inpatient Hospital Stay (HOSPITAL_COMMUNITY): Payer: Medicaid Other

## 2010-08-16 DIAGNOSIS — I4891 Unspecified atrial fibrillation: Secondary | ICD-10-CM

## 2010-08-16 LAB — CBC
HCT: 35.9 % — ABNORMAL LOW (ref 36.0–46.0)
MCH: 26.6 pg (ref 26.0–34.0)
MCHC: 31.5 g/dL (ref 30.0–36.0)
MCV: 84.5 fL (ref 78.0–100.0)
Platelets: 218 10*3/uL (ref 150–400)
RDW: 17 % — ABNORMAL HIGH (ref 11.5–15.5)

## 2010-08-16 LAB — RENAL FUNCTION PANEL
Albumin: 2.7 g/dL — ABNORMAL LOW (ref 3.5–5.2)
BUN: 45 mg/dL — ABNORMAL HIGH (ref 6–23)
Creatinine, Ser: 6.32 mg/dL — ABNORMAL HIGH (ref 0.4–1.2)
Glucose, Bld: 120 mg/dL — ABNORMAL HIGH (ref 70–99)
Phosphorus: 4.5 mg/dL (ref 2.3–4.6)
Potassium: 4.5 mEq/L (ref 3.5–5.1)

## 2010-08-16 LAB — GLUCOSE, CAPILLARY
Glucose-Capillary: 123 mg/dL — ABNORMAL HIGH (ref 70–99)
Glucose-Capillary: 101 mg/dL — ABNORMAL HIGH (ref 70–99)

## 2010-08-16 MED ORDER — SODIUM IODIDE I 131 CAPSULE
29.6000 | Freq: Once | INTRAVENOUS | Status: AC | PRN
  Administered 2010-08-16: 29.6 via ORAL

## 2010-08-16 NOTE — Progress Notes (Signed)
Addended by: Sinda Du on: 08/16/2010 02:33 PM   Modules accepted: Orders

## 2010-08-16 NOTE — Patient Instructions (Signed)
This is a 75 year old female with a past medical hx significant afib with recurrent RVR and multiple readmissions for palpitations.  Pt was evaluated by endocrine who recommended radioactive iodine ablation which she received with a plan to fu in 2 weeks.  Pt was also seen by cards who only recommended metoprolol 25mg  tid with prn doses for palpitations.  Pt was discharged in stable condition and sinus bradycardia with plan to get her ablation then fu with Nuc med on the day after admission to determine where she should receive her dialysis based on her radiation levels.  At [facility] fu, pt should be questioned regarding further palpitations and BP meds adjusted as needed.  For complete update see DC summary.   Meds and PMH updated.

## 2010-08-18 ENCOUNTER — Ambulatory Visit (HOSPITAL_COMMUNITY)
Admission: RE | Admit: 2010-08-18 | Discharge: 2010-08-18 | Disposition: A | Discharge status: Home / Self Care | Payer: Medicaid Other | Source: Ambulatory Visit | Attending: Nephrology | Admitting: Nephrology

## 2010-08-18 ENCOUNTER — Ambulatory Visit (HOSPITAL_COMMUNITY): Payer: Medicaid Other

## 2010-08-18 LAB — PROTIME-INR: INR: 2.38 — ABNORMAL HIGH (ref 0.00–1.49)

## 2010-08-19 ENCOUNTER — Other Ambulatory Visit (HOSPITAL_COMMUNITY): Payer: Medicaid Other

## 2010-08-19 ENCOUNTER — Inpatient Hospital Stay (HOSPITAL_COMMUNITY)
Admission: EM | Admit: 2010-08-19 | Discharge: 2010-08-25 | DRG: 308 | Disposition: A | Discharge status: Home / Self Care | Payer: Medicaid Other | Attending: Internal Medicine | Admitting: Internal Medicine

## 2010-08-19 ENCOUNTER — Inpatient Hospital Stay (HOSPITAL_COMMUNITY): Payer: Medicaid Other

## 2010-08-19 ENCOUNTER — Encounter: Payer: Self-pay | Admitting: Internal Medicine

## 2010-08-19 ENCOUNTER — Emergency Department (HOSPITAL_COMMUNITY): Payer: Medicaid Other

## 2010-08-19 DIAGNOSIS — F411 Generalized anxiety disorder: Secondary | ICD-10-CM | POA: Diagnosis present

## 2010-08-19 DIAGNOSIS — I4891 Unspecified atrial fibrillation: Secondary | ICD-10-CM

## 2010-08-19 DIAGNOSIS — I252 Old myocardial infarction: Secondary | ICD-10-CM

## 2010-08-19 DIAGNOSIS — I120 Hypertensive chronic kidney disease with stage 5 chronic kidney disease or end stage renal disease: Secondary | ICD-10-CM | POA: Diagnosis present

## 2010-08-19 DIAGNOSIS — E039 Hypothyroidism, unspecified: Secondary | ICD-10-CM | POA: Diagnosis present

## 2010-08-19 DIAGNOSIS — E876 Hypokalemia: Secondary | ICD-10-CM | POA: Diagnosis present

## 2010-08-19 DIAGNOSIS — N2581 Secondary hyperparathyroidism of renal origin: Secondary | ICD-10-CM | POA: Diagnosis present

## 2010-08-19 DIAGNOSIS — N186 End stage renal disease: Secondary | ICD-10-CM

## 2010-08-19 DIAGNOSIS — Z7901 Long term (current) use of anticoagulants: Secondary | ICD-10-CM

## 2010-08-19 DIAGNOSIS — E785 Hyperlipidemia, unspecified: Secondary | ICD-10-CM | POA: Diagnosis present

## 2010-08-19 DIAGNOSIS — J189 Pneumonia, unspecified organism: Secondary | ICD-10-CM

## 2010-08-19 DIAGNOSIS — J90 Pleural effusion, not elsewhere classified: Secondary | ICD-10-CM | POA: Diagnosis present

## 2010-08-19 LAB — PROTIME-INR: Prothrombin Time: 25.1 seconds — ABNORMAL HIGH (ref 11.6–15.2)

## 2010-08-19 LAB — MAGNESIUM: Magnesium: 2.3 mg/dL (ref 1.5–2.5)

## 2010-08-19 LAB — CARDIAC PANEL(CRET KIN+CKTOT+MB+TROPI)
CK, MB: 1.8 ng/mL (ref 0.3–4.0)
Relative Index: INVALID (ref 0.0–2.5)
Total CK: 21 U/L (ref 7–177)
Troponin I: <0.3 ng/mL (ref <0.30)

## 2010-08-19 LAB — TROPONIN I: Troponin I: <0.3 ng/mL (ref <0.30)

## 2010-08-19 LAB — CBC
HCT: 40.5 % (ref 36.0–46.0)
Hemoglobin: 12.7 g/dL (ref 12.0–15.0)
MCH: 26.3 pg (ref 26.0–34.0)
MCHC: 31.4 g/dL (ref 30.0–36.0)
MCV: 84 fL (ref 78.0–100.0)
RBC: 4.82 MIL/uL (ref 3.87–5.11)

## 2010-08-19 LAB — DIFFERENTIAL
Basophils Relative: 1 % (ref 0–1)
Lymphocytes Relative: 18 % (ref 12–46)
Lymphs Abs: 1.3 10*3/uL (ref 0.7–4.0)
Monocytes Absolute: 0.6 10*3/uL (ref 0.1–1.0)
Monocytes Relative: 8 % (ref 3–12)
Neutro Abs: 4.6 10*3/uL (ref 1.7–7.7)
Neutrophils Relative %: 62 % (ref 43–77)

## 2010-08-19 LAB — CK TOTAL AND CKMB (NOT AT ARMC)
CK, MB: 1.7 ng/mL (ref 0.3–4.0)
Total CK: 24 U/L (ref 7–177)

## 2010-08-19 LAB — BASIC METABOLIC PANEL
Calcium: 9.3 mg/dL (ref 8.4–10.5)
GFR calc non Af Amer: 13 mL/min — ABNORMAL LOW (ref >60)
Glucose, Bld: 111 mg/dL — ABNORMAL HIGH (ref 70–99)
Potassium: 3.3 mEq/L — ABNORMAL LOW (ref 3.5–5.1)
Sodium: 138 mEq/L (ref 135–145)

## 2010-08-19 LAB — TSH: TSH: <0.008 u[IU]/mL — ABNORMAL LOW (ref 0.350–4.500)

## 2010-08-19 LAB — T4, FREE: Free T4: 1.67 ng/dL (ref 0.80–1.80)

## 2010-08-19 MED ORDER — IOHEXOL 300 MG/ML  SOLN
100.0000 mL | Freq: Once | INTRAMUSCULAR | Status: AC | PRN
  Administered 2010-08-19: 100 mL via INTRAVENOUS

## 2010-08-19 NOTE — Progress Notes (Signed)
Hospital Admission Note Date: 08/19/2010  Patient name: [name] Medical record number: [id number] Date of birth: [birth year] Age: 75 y.o. Gender: female PCP: Ulyess Mort, MD   Medical Service: IMTS   Attending physician: Dr. Margarito Liner  Pager:  Resident (R2/R3): Dr. Scot Dock     Pager: [phone]  Resident (R1): Dr. Cathey Endow     Pager: [phone]    Chief Complaint: palpitations   History of Present Illness: Patient is a 75 y.o. female with a PMHx of ESRD on HD (TTS), HTN, NSTEMI (09/2009), paroxysmal atrial fibrillation on chronic coumadin therapy, and hyperthyroidism for which she is s/p radioactive iodine ablation on 08/16/2010 presenting to [facility] for evaluation of palpitations that began earlier on the day of admission hours following her HD session. She currently denied having any chest discomfort initially on presentation, however subsequently developed chest discomfort while in the ED. However, she denied having any associated sob, presyncopal or syncopal episodes, fevers, chills, nausea, vomiting, dizziness, weakness, lightheadedness, urinary changes, or leg swelling. While in the ED, patient was found to be in afib with RVR. Chest xray obtained in the ED also showed a developing R. lung pleural effusion associated with a PNA vs atelectasis.  Of note, this is the patient's 3rd admission for afib with RVR, with the recurrent episodes thought to be 2/2 uncontrolled hyperthyroidsm. During her previous 2 admissions however, she was rate controlled with Metoprolol and discharged home on 25mg  TID for which the patient states she has been compliant.    Current Outpatient Prescriptions  Medication Sig Dispense Refill  . Calcium Acetate 667 MG TABS Take by mouth 3 (three) times daily before meals.        . cycloSPORINE (RESTASIS) 0.05 % ophthalmic emulsion Place 1 drop into both eyes 2 (two) times daily.        . metoprolol tartrate (LOPRESSOR) 25 MG tablet Take 25 mg by mouth 3 (three) times  daily. Take one PRN for palpitations.       . nitroGLYCERIN (NITROSTAT) 0.4 MG SL tablet Place 0.4 mg under the tongue every 5 (five) minutes as needed.        . ranitidine (ZANTAC) 150 MG tablet Take 150 mg by mouth daily.        Marland Kitchen warfarin (COUMADIN) 5 MG tablet Take 1 tablet (5 mg total) by mouth as directed.  50 tablet  3    Allergies: Cardizem; Codeine; Iron; and Venofer  Past Medical History  Diagnosis Date  . ESRD (end stage renal disease)     HD on Tues, Thurs, Sat // Likely secondary to nephrosclerosis from HTN  . HTN (hypertension)   . HLD (hyperlipidemia)   . Cholelithiases     S/P biliary colic cholecystostomy, but not cholecystectomy secondary to omental adhesions  . Anxiety   . Anemia     BL Hgb 8-9. Likely AOCD in setting of ESRD with iron 48, ferritin  746 (03/2010)  . PAF (paroxysmal atrial fibrillation)     On chronic coumadin therapy // Followed by Dr. Excell Seltzer (cardiology)  . Non-ST elevation MI (NSTEMI)     Noninvasive evaluation with Myoview negative for ischemia.  // 2-D echo (05/2010) LVEF 65-70%. Grade 1 diastolic dysfunction.  Peak PA pressure 52.  Marland Kitchen Hyperthyroidism DX: 07/2010    Likely Grave's Disease. // On diagnosis, TSH < 0.08, free T4 1.94, free T3 5.3 . //  Thyroid US (07/17/2010) - nonspecific diffuse heterogenous thyroid gland. //  Thyrotropin receptor antibody neg (07/17/2010)    Past  Surgical History  Procedure Date  . Other surgical history 02/2009    Cholecystostomy secondary to biliary colic - NO cholecystectomy secondary to omental adhesions - by Dr. Bertram Savin    Family History  Problem Relation Age of Onset  . Stroke    . Heart attack      History   Social History  . Marital Status: Married    Spouse Name: N/A    Number of Children: 0  . Years of Education: 78 th grad   Occupational History  .     Social History Main Topics  . Smoking status: Never Smoker   . Smokeless tobacco: Not on file  . Alcohol Use: No  . Drug Use:  No  . Sexually Active: Not on file   Other Topics Concern  . Not on file   Social History Narrative  . No narrative on file    Review of Systems: Pertinent items are noted in HPI.  Physical Exam:  Vitals: t 98.4, p 151, rr 20, bp 140/77, o2 sat 98%2L  General:  Vital signs reviewed and noted. Well-developed, well-nourished, in no acute distress; alert, appropriate and cooperative throughout examination.   Head:  Normocephalic, atraumatic.   Eyes:  PERRL, EOMI, No signs of anemia or jaundince.   Nose:  Mucous membranes moist, not inflammed, nonerythematous.   Throat:  Oropharynx nonerythematous, no exudate appreciated.   Neck:  No deformities, masses, or tenderness noted.Supple, No carotid Bruits, no JVD.   Lungs:  Normal respiratory effort. Clear to auscultation BL without crackles or wheezes.   Heart:  Irregularly irregular rate and rhythm. S1 and S2 normal without gallop, murmur, or rubs.   Abdomen:  BS normoactive. Soft, Nondistended, non-tender. No masses or organomegaly.   Extremities:  No pretibial edema.   Neurologic:  A&O X3, CN II - XII are grossly intact. Motor strength is 5/5 in the all 4 extremities.   Skin:  No visible rashes, scars.    Lab results: CBC:    Component Value Date/Time   WBC 7.5 08/19/2010 0149   HGB 12.7 08/19/2010 0149   HCT 40.5 08/19/2010 0149   PLT 223 08/19/2010 0149   MCV 84.0 08/19/2010 0149   NEUTROABS 4.6 08/19/2010 0149   LYMPHSABS 1.3 08/19/2010 0149   MONOABS 0.6 08/19/2010 0149   EOSABS 0.9* 08/19/2010 0149   BASOSABS 0.1 08/19/2010 0149    Basic Metabolic Panel:    Component Value Date/Time   NA 138 08/19/2010 0149   K 3.3* 08/19/2010 0149   CL 100 08/19/2010 0149   CO2 30 08/19/2010 0149   BUN 17 08/19/2010 0149   CREATININE 3.41* 08/19/2010 0149   GLUCOSE 111* 08/19/2010 0149   CALCIUM 9.3 08/19/2010 0149   CALCIUM 8.8 09/09/2009 0500   Protime ( Prothrombin Time)              25.1       h      11.6-15.2        seconds  INR                                       2.26       h      0.00-1.49   Creatine Kinase, Total                   24  7-177            U/L  CK, MB                                   1.7               0.3-4.0          ng/mL  Relative Index                           SEE NOTE.         0.0-2.5   Troponin I                               <0.30             <0.30            Ng/mL  Imaging results:  Clinical Data: Pain.    PORTABLE CHEST - 1 VIEW    Comparison: 07/16/2010    Findings: Mild cardiac enlargement and pulmonary vascular   prominence similar to prior study.  Interval development of   opacification of the right lower lung suggesting developing right   pleural effusion and basilar consolidation.  Changes are consistent   with pneumonia in the appropriate clinical setting.  Calcified   aorta.    IMPRESSION:   Interval development of diffuse opacity over the right lower chest   consistent with pleural effusion and basilar atelectasis /   consolidation.   Assessment & Plan by Problem:  1. Afib with RVR - previously thought to be 2/2 uncontrolled hyperthyroidism, however is day 2 s/p radioactive iodine ablation. As such, it is very likely that this is due to her developing right lung pleural effusion which is quite significant. However, underlying cause for the effusion is currently unclear. She received Metoprolol 5mg  IV and then subsequently Labetolol 10mg  IV push but did not respond to either treatment as her heart rate remained in the 140s to 160s. She subsequently complained of substernal chest discomfort, however EKG was consistent with afib and initial CEs were negative. Given that she is allergic to Diltiazem and was not responding to Metoprol, and in the setting of being unable to start Amiodorone given her thyroid and pulmonary disease, cardiology was consulted for further assistance. Plan as follows - admit to ICU for closer monitoring given persistent ventricular tachycardia - INR therapeutic.  However will d/c coumadin and start heparin gtt (in the event that she needs to be tapped) for anticoagulation - cardiology consult to help control ventricular rate - recommend starting Esmolol drip.   2. Right Lung Pleural effusion - this is quite concerning giving that patient's CXR from about one month ago was free of effusions. Differential diagnosis includes infection-bacterial vs fungal vs TB, although she is afebrile and does not have a white count, vs malignancy which will have to be a fast growing tumor given CTA of the chest from March 2012 did not show presence of any masses and recent plain films did not show evidence for pulmonary nodules or lymphadenopathy vs CHF, which could have been exacerbated by RVR, vs hypothyroidism given recent h/o radioactive iodine ablation, vs atelectasis. The patient does not complain of any shortness of breath currently and so there is no need for an emergent therapeutic tap, however she may require  a diagnostic tap to evaluate for the cause of the fluid accumulation.  - obtain CT chest with contrast to further evaluate pulmonary architecture (cleared with Dr. Caryn Section for patient to receive IV contrast) - IR for thoracentesis as indicated.   3. H/o Hyperthyroidism - patient is now 3 days s/p radioactive iodine ablation, no complaints of thyroid pain. ? If she was resistant to the treatment? Unfortunately checking TFTs in the acute setting will be unreliable so soon after the procedure.   4. HTN - blood pressures currently stable. Monitor closely in the ICU given Esmolol gtt.  5. DVT ppx - Heparin

## 2010-08-19 NOTE — Progress Notes (Unsigned)
Hospital Admission Note Date: 08/19/2010  Patient name: [name] Medical record number: [id number] Date of birth: [birth year] Age: 75 y.o. Gender: female PCP: Ulyess Mort, MD  Medical Service: IMTS  Attending physician:  Dr. Margarito Liner  Pager: Resident (R2/R3): Dr. Scot Dock   Pager: [phone] Resident (R1): Dr. Cathey Endow   Pager: [phone]  Chief Complaint: palpitations  History of Present Illness: Patient is a 75 y.o. female with a PMHx of ESRD on HD (TTS), HTN, NSTEMI (09/2009), paroxysmal atrial fibrillation on chronic coumadin therapy, and hyperthyroidism for which she is s/p radioactive iodine ablation on 08/16/2010 presenting to [facility] for evaluation of palpitations that began earlier on the day of admission hours following her HD session. She currently denied having any chest discomfort initially on presentation, however subsequently developed chest discomfort while in the ED. However, she denied having any associated sob, presyncopal or syncopal episodes, fevers, chills, nausea, vomiting, dizziness, weakness, lightheadedness, urinary changes, or leg swelling. While in the ED, patient was found to be in afib with RVR. Chest xray obtained in the ED also showed a developing R. lung pleural effusion associated with a PNA vs atelectasis.  Of note, this is the patient's 3rd admission for afib with RVR, with the recurrent episodes thought to be 2/2 uncontrolled hyperthyroidsm. During her previous 2 admissions however, she was rate controlled with Metoprolol and discharged home on 25mg  TID for which the patient states she has been compliant.   Current Outpatient Prescriptions  Medication Sig Dispense Refill  . Calcium Acetate 667 MG TABS Take by mouth 3 (three) times daily before meals.        . cycloSPORINE (RESTASIS) 0.05 % ophthalmic emulsion Place 1 drop into both eyes 2 (two) times daily.        . metoprolol tartrate (LOPRESSOR) 25 MG tablet Take 25 mg by mouth 3 (three) times daily. Take one  PRN for palpitations.       . nitroGLYCERIN (NITROSTAT) 0.4 MG SL tablet Place 0.4 mg under the tongue every 5 (five) minutes as needed.        . ranitidine (ZANTAC) 150 MG tablet Take 150 mg by mouth daily.        Marland Kitchen warfarin (COUMADIN) 5 MG tablet Take 1 tablet (5 mg total) by mouth as directed.  50 tablet  3    Allergies: Cardizem; Codeine; Iron; and Venofer  Past Medical History  Diagnosis Date  . ESRD (end stage renal disease)     HD on Tues, Thurs, Sat // Likely secondary to nephrosclerosis from HTN  . HTN (hypertension)   . HLD (hyperlipidemia)   . Cholelithiases     S/P biliary colic cholecystostomy, but not cholecystectomy secondary to omental adhesions  . Anxiety   . Anemia     BL Hgb 8-9. Likely AOCD in setting of ESRD with iron 48, ferritin  746 (03/2010)  . PAF (paroxysmal atrial fibrillation)     On chronic coumadin therapy // Followed by Dr. Excell Seltzer (cardiology)  . Non-ST elevation MI (NSTEMI)     Noninvasive evaluation with Myoview negative for ischemia.  // 2-D echo (05/2010) LVEF 65-70%. Grade 1 diastolic dysfunction.  Peak PA pressure 52.  Marland Kitchen Hyperthyroidism DX: 07/2010    Likely Grave's Disease. // On diagnosis, TSH < 0.08, free T4 1.94, free T3 5.3 . //  Thyroid US (07/17/2010) - nonspecific diffuse heterogenous thyroid gland. //  Thyrotropin receptor antibody neg (07/17/2010)    Past Surgical History  Procedure Date  . Other surgical history 02/2009  Cholecystostomy secondary to biliary colic - NO cholecystectomy secondary to omental adhesions - by Dr. Bertram Savin    Family History  Problem Relation Age of Onset  . Stroke    . Heart attack      History   Social History  . Marital Status: Married    Spouse Name: N/A    Number of Children: 0  . Years of Education: 60 th grad   Occupational History  .     Social History Main Topics  . Smoking status: Never Smoker   . Smokeless tobacco: Not on file  . Alcohol Use: No  . Drug Use: No  . Sexually  Active: Not on file   Other Topics Concern  . Not on file   Social History Narrative  . No narrative on file    Review of Systems: {Review Of Systems:30496}  Physical Exam:  There were no vitals filed for this visit. {female exam, choose systems:17926} {female exam, choose systems:17872}  Lab results:  Imaging results:   Other results:  Assessment & Plan by Problem:

## 2010-08-19 NOTE — Progress Notes (Unsigned)
Hospital Admission Note Date: 08/19/2010  Patient name: [name] Medical record number: [id number] Date of birth: May 03, 1935 Age: 75 y.o. Gender: female PCP: Ulyess Mort, MD  Medical Service: IMTS  Attending physician:     Pager: Resident (R2/R3):     Pager: Resident (R1):     Pager:  Chief Complaint:  History of Present Illness:  Current Outpatient Prescriptions  Medication Sig Dispense Refill  . Calcium Acetate 667 MG TABS Take by mouth 3 (three) times daily before meals.        . cycloSPORINE (RESTASIS) 0.05 % ophthalmic emulsion Place 1 drop into both eyes 2 (two) times daily.        . metoprolol tartrate (LOPRESSOR) 25 MG tablet Take 25 mg by mouth 3 (three) times daily. Take one PRN for palpitations.       . nitroGLYCERIN (NITROSTAT) 0.4 MG SL tablet Place 0.4 mg under the tongue every 5 (five) minutes as needed.        . ranitidine (ZANTAC) 150 MG tablet Take 150 mg by mouth daily.        Marland Kitchen warfarin (COUMADIN) 5 MG tablet Take 1 tablet (5 mg total) by mouth as directed.  50 tablet  3    Allergies: Cardizem; Codeine; Iron; and Venofer  Past Medical History  Diagnosis Date  . ESRD (end stage renal disease)     HD on Tues, Thurs, Sat // Likely secondary to nephrosclerosis from HTN  . HTN (hypertension)   . HLD (hyperlipidemia)   . Cholelithiases     S/P biliary colic cholecystostomy, but not cholecystectomy secondary to omental adhesions  . Anxiety   . Anemia     BL Hgb 8-9. Likely AOCD in setting of ESRD with iron 48, ferritin  746 (03/2010)  . PAF (paroxysmal atrial fibrillation)     On chronic coumadin therapy // Followed by Dr. Excell Seltzer (cardiology)  . Non-ST elevation MI (NSTEMI)     Noninvasive evaluation with Myoview negative for ischemia.  // 2-D echo (05/2010) LVEF 65-70%. Grade 1 diastolic dysfunction.  Peak PA pressure 52.  Marland Kitchen Hyperthyroidism DX: 07/2010    Likely Grave's Disease. // On diagnosis, TSH < 0.08, free T4 1.94, free T3 5.3 . //  Thyroid US  (07/17/2010) - nonspecific diffuse heterogenous thyroid gland. //  Thyrotropin receptor antibody neg (07/17/2010)    Past Surgical History  Procedure Date  . Other surgical history 02/2009    Cholecystostomy secondary to biliary colic - NO cholecystectomy secondary to omental adhesions - by Dr. Bertram Savin    Family History  Problem Relation Age of Onset  . Stroke    . Heart attack      History   Social History  . Marital Status: Married    Spouse Name: N/A    Number of Children: 0  . Years of Education: 66 th grad   Occupational History  .     Social History Main Topics  . Smoking status: Never Smoker   . Smokeless tobacco: Not on file  . Alcohol Use: No  . Drug Use: No  . Sexually Active: Not on file   Other Topics Concern  . Not on file   Social History Narrative  . No narrative on file    Review of Systems: {Review Of Systems:30496}  Physical Exam:  There were no vitals filed for this visit. {female exam, choose systems:17926} {female exam, choose systems:17872}  Lab results:  Imaging results:   Other results:  Assessment & Plan by  Problem:

## 2010-08-20 ENCOUNTER — Inpatient Hospital Stay (HOSPITAL_COMMUNITY): Payer: Medicaid Other

## 2010-08-20 DIAGNOSIS — I4891 Unspecified atrial fibrillation: Secondary | ICD-10-CM

## 2010-08-20 LAB — PROTIME-INR: INR: 2.84 — ABNORMAL HIGH (ref 0.00–1.49)

## 2010-08-20 LAB — RENAL FUNCTION PANEL
Albumin: 2.6 g/dL — ABNORMAL LOW (ref 3.5–5.2)
BUN: 28 mg/dL — ABNORMAL HIGH (ref 6–23)
Calcium: 8.7 mg/dL (ref 8.4–10.5)
Creatinine, Ser: 5.2 mg/dL — ABNORMAL HIGH (ref 0.4–1.2)
Phosphorus: 5.1 mg/dL — ABNORMAL HIGH (ref 2.3–4.6)

## 2010-08-20 LAB — CBC
HCT: 34.5 % — ABNORMAL LOW (ref 36.0–46.0)
MCHC: 31.6 g/dL (ref 30.0–36.0)
MCV: 83.3 fL (ref 78.0–100.0)
Platelets: 214 10*3/uL (ref 150–400)
RDW: 16.7 % — ABNORMAL HIGH (ref 11.5–15.5)

## 2010-08-21 ENCOUNTER — Inpatient Hospital Stay (HOSPITAL_COMMUNITY): Payer: Medicaid Other

## 2010-08-22 ENCOUNTER — Encounter: Payer: Medicaid Other | Admitting: *Deleted

## 2010-08-22 ENCOUNTER — Ambulatory Visit: Payer: Medicaid Other | Admitting: Internal Medicine

## 2010-08-22 ENCOUNTER — Inpatient Hospital Stay (HOSPITAL_COMMUNITY): Payer: Medicaid Other

## 2010-08-22 LAB — HEPARIN LEVEL (UNFRACTIONATED): Heparin Unfractionated: 0.35 IU/mL (ref 0.30–0.70)

## 2010-08-22 LAB — CBC
HCT: 36.1 % (ref 36.0–46.0)
MCH: 25.6 pg — ABNORMAL LOW (ref 26.0–34.0)
MCHC: 30.7 g/dL (ref 30.0–36.0)
MCV: 83.2 fL (ref 78.0–100.0)
RDW: 16.8 % — ABNORMAL HIGH (ref 11.5–15.5)
WBC: 8.5 10*3/uL (ref 4.0–10.5)

## 2010-08-22 LAB — RENAL FUNCTION PANEL
Albumin: 2.7 g/dL — ABNORMAL LOW (ref 3.5–5.2)
Chloride: 102 mEq/L (ref 96–112)
GFR calc Af Amer: 8 mL/min — ABNORMAL LOW (ref >60)
GFR calc non Af Amer: 7 mL/min — ABNORMAL LOW (ref >60)
Potassium: 3.8 mEq/L (ref 3.5–5.1)
Sodium: 139 mEq/L (ref 135–145)

## 2010-08-22 LAB — PROTIME-INR
INR: 1.96 — ABNORMAL HIGH (ref 0.00–1.49)
Prothrombin Time: 22.5 seconds — ABNORMAL HIGH (ref 11.6–15.2)

## 2010-08-23 ENCOUNTER — Inpatient Hospital Stay (HOSPITAL_COMMUNITY): Payer: Medicaid Other

## 2010-08-23 LAB — BASIC METABOLIC PANEL
BUN: 15 mg/dL (ref 6–23)
GFR calc Af Amer: 12 mL/min — ABNORMAL LOW (ref >60)
GFR calc non Af Amer: 10 mL/min — ABNORMAL LOW (ref >60)
Potassium: 3.3 mEq/L — ABNORMAL LOW (ref 3.5–5.1)
Sodium: 140 mEq/L (ref 135–145)

## 2010-08-23 LAB — HEPARIN LEVEL (UNFRACTIONATED): Heparin Unfractionated: 0.37 IU/mL (ref 0.30–0.70)

## 2010-08-23 LAB — CBC
MCV: 83.8 fL (ref 78.0–100.0)
Platelets: 266 10*3/uL (ref 150–400)
RDW: 17.2 % — ABNORMAL HIGH (ref 11.5–15.5)
WBC: 7.3 10*3/uL (ref 4.0–10.5)

## 2010-08-23 MED ORDER — IOHEXOL 300 MG/ML  SOLN
100.0000 mL | Freq: Once | INTRAMUSCULAR | Status: AC | PRN
  Administered 2010-08-23: 65 mL via INTRAVENOUS

## 2010-08-24 ENCOUNTER — Inpatient Hospital Stay (HOSPITAL_COMMUNITY): Payer: Medicaid Other

## 2010-08-24 LAB — RENAL FUNCTION PANEL
Albumin: 2.6 g/dL — ABNORMAL LOW (ref 3.5–5.2)
BUN: 24 mg/dL — ABNORMAL HIGH (ref 6–23)
Creatinine, Ser: 6.12 mg/dL — ABNORMAL HIGH (ref 0.4–1.2)
Phosphorus: 5 mg/dL — ABNORMAL HIGH (ref 2.3–4.6)
Potassium: 3.6 mEq/L (ref 3.5–5.1)

## 2010-08-24 LAB — CBC
MCV: 83.3 fL (ref 78.0–100.0)
Platelets: 250 10*3/uL (ref 150–400)
RDW: 17.2 % — ABNORMAL HIGH (ref 11.5–15.5)
WBC: 7.9 10*3/uL (ref 4.0–10.5)

## 2010-08-25 ENCOUNTER — Inpatient Hospital Stay (HOSPITAL_COMMUNITY): Payer: Medicaid Other

## 2010-08-25 DIAGNOSIS — I4891 Unspecified atrial fibrillation: Secondary | ICD-10-CM

## 2010-08-25 DIAGNOSIS — J189 Pneumonia, unspecified organism: Secondary | ICD-10-CM

## 2010-08-25 LAB — CBC
MCH: 26 pg (ref 26.0–34.0)
MCHC: 31.1 g/dL (ref 30.0–36.0)
MCV: 83.5 fL (ref 78.0–100.0)
Platelets: 239 10*3/uL (ref 150–400)
RDW: 17.6 % — ABNORMAL HIGH (ref 11.5–15.5)
WBC: 7.4 10*3/uL (ref 4.0–10.5)

## 2010-08-25 LAB — BASIC METABOLIC PANEL
BUN: 13 mg/dL (ref 6–23)
Creatinine, Ser: 3.71 mg/dL — ABNORMAL HIGH (ref 0.4–1.2)
GFR calc Af Amer: 14 mL/min — ABNORMAL LOW (ref >60)
GFR calc non Af Amer: 12 mL/min — ABNORMAL LOW (ref >60)

## 2010-08-25 LAB — CULTURE, BLOOD (ROUTINE X 2)
Culture  Setup Time: 201206080936
Culture: NO GROWTH

## 2010-08-26 ENCOUNTER — Telehealth: Payer: Self-pay | Admitting: Cardiovascular Disease

## 2010-08-26 NOTE — Telephone Encounter (Signed)
Pt was discharged from hospital on 08/25/10 INR 1.3.  Advised [name] pt has appt to come into [facility] and have her Coumadin checked on 08/29/10.

## 2010-08-26 NOTE — Telephone Encounter (Signed)
Morrie Sheldon needs to speak with the coumadin clinic per pt coumadin and testing.

## 2010-08-29 ENCOUNTER — Encounter: Payer: Medicaid Other | Admitting: *Deleted

## 2010-08-29 ENCOUNTER — Ambulatory Visit: Payer: Medicaid Other | Admitting: Internal Medicine

## 2010-08-30 ENCOUNTER — Encounter: Payer: Self-pay | Admitting: Internal Medicine

## 2010-08-30 ENCOUNTER — Inpatient Hospital Stay (HOSPITAL_COMMUNITY)
Admission: EM | Admit: 2010-08-30 | Discharge: 2010-09-01 | DRG: 371 | Disposition: A | Discharge status: Home / Self Care | Payer: Medicaid Other | Attending: Internal Medicine | Admitting: Internal Medicine

## 2010-08-30 DIAGNOSIS — R002 Palpitations: Secondary | ICD-10-CM

## 2010-08-30 DIAGNOSIS — Z91041 Radiographic dye allergy status: Secondary | ICD-10-CM

## 2010-08-30 DIAGNOSIS — Z992 Dependence on renal dialysis: Secondary | ICD-10-CM

## 2010-08-30 DIAGNOSIS — I4891 Unspecified atrial fibrillation: Secondary | ICD-10-CM | POA: Diagnosis present

## 2010-08-30 DIAGNOSIS — D631 Anemia in chronic kidney disease: Secondary | ICD-10-CM | POA: Diagnosis present

## 2010-08-30 DIAGNOSIS — N186 End stage renal disease: Secondary | ICD-10-CM | POA: Diagnosis present

## 2010-08-30 DIAGNOSIS — Z9119 Patient's noncompliance with other medical treatment and regimen: Secondary | ICD-10-CM

## 2010-08-30 DIAGNOSIS — Z888 Allergy status to other drugs, medicaments and biological substances status: Secondary | ICD-10-CM

## 2010-08-30 DIAGNOSIS — A0472 Enterocolitis due to Clostridium difficile, not specified as recurrent: Secondary | ICD-10-CM

## 2010-08-30 DIAGNOSIS — E890 Postprocedural hypothyroidism: Secondary | ICD-10-CM | POA: Diagnosis present

## 2010-08-30 DIAGNOSIS — I120 Hypertensive chronic kidney disease with stage 5 chronic kidney disease or end stage renal disease: Secondary | ICD-10-CM | POA: Diagnosis present

## 2010-08-30 DIAGNOSIS — K8020 Calculus of gallbladder without cholecystitis without obstruction: Secondary | ICD-10-CM | POA: Diagnosis present

## 2010-08-30 DIAGNOSIS — F411 Generalized anxiety disorder: Secondary | ICD-10-CM | POA: Diagnosis present

## 2010-08-30 DIAGNOSIS — I252 Old myocardial infarction: Secondary | ICD-10-CM

## 2010-08-30 DIAGNOSIS — E785 Hyperlipidemia, unspecified: Secondary | ICD-10-CM | POA: Diagnosis present

## 2010-08-30 DIAGNOSIS — N039 Chronic nephritic syndrome with unspecified morphologic changes: Secondary | ICD-10-CM | POA: Diagnosis present

## 2010-08-30 DIAGNOSIS — Z7901 Long term (current) use of anticoagulants: Secondary | ICD-10-CM

## 2010-08-30 DIAGNOSIS — Z91199 Patient's noncompliance with other medical treatment and regimen due to unspecified reason: Secondary | ICD-10-CM

## 2010-08-30 LAB — POCT I-STAT, CHEM 8
Calcium, Ion: 1.08 mmol/L — ABNORMAL LOW (ref 1.12–1.32)
HCT: 34 % — ABNORMAL LOW (ref 36.0–46.0)
TCO2: 31 mmol/L (ref 0–100)

## 2010-08-30 NOTE — Progress Notes (Signed)
Hospital Admission Note Date: 08/30/2010  Patient name: [name] Medical record number: [id number] Date of birth: [birth year] Age: 75 y.o. Gender: female PCP: Ulyess Mort, MD  Medical Service: Internal Medicine Teaching Service B1  Attending physician: Dr. Margarito Liner Resident (R2/R3): Dr. Bethel Born  Pager: [phone] Resident (R1): Dr. Tobias Alexander   Pager: [phone]   Chief Complaint: Palpitations and diarrhea x 3 days  History of Present Illness: Patient is a 75 year old woman who presents today to the [facility] ED because of palpitations and diarrhea for the last 3 days.  She was recently released from [facility] because of A. Fib with RVR and is followed by Dr. Excell Seltzer from [facility].  Her A. Fib was thought to be secondary to uncontrolled hyperthyroidism which she is now s/p radioactive iodine therapy.  She is followed by Dr. Lucianne Muss.  She states that for the last 3 days she has had diarrhea and a "fluttering" in her chest.  The fluttering has been persistent but not painful.  She denies any diaphoresis, SOB, dizziness, nausea, vomiting, increased swelling in her legs, or chest pain.  She also has had 5-6 watery BMs daily for the last 3 days.  She states that whenever she eats anything she has to have a bowel movement immediately after that.  She has not been eating much because of these symptoms and has also been experiencing crampy abdominal pain constantly.  She denies any blood in her stool, vomiting, dark tarry stools, or particularly odiferous.  In the ER she was started on an Esmolol drip which had been successful in converting her in her previous admission.    Current Outpatient Prescriptions  Medication Sig Dispense Refill  . Calcium Acetate 667 MG TABS Take by mouth 3 (three) times daily before meals.        . cycloSPORINE (RESTASIS) 0.05 % ophthalmic emulsion Place 1 drop into both eyes 2 (two) times daily.        . metoprolol tartrate (LOPRESSOR) 25 MG tablet Take 25 mg by  mouth 3 (three) times daily. Take one PRN for palpitations.       . nitroGLYCERIN (NITROSTAT) 0.4 MG SL tablet Place 0.4 mg under the tongue every 5 (five) minutes as needed.        . ranitidine (ZANTAC) 150 MG tablet Take 150 mg by mouth daily.        Marland Kitchen warfarin (COUMADIN) 5 MG tablet Take 1 tablet (5 mg total) by mouth as directed.  50 tablet  3   Allergies: Cardizem; Codeine; Iron; and Venofer  Past Medical History  Diagnosis Date  . ESRD (end stage renal disease)     HD on Tues, Thurs, Sat // Likely secondary to nephrosclerosis from HTN  . HTN (hypertension)   . HLD (hyperlipidemia)   . Cholelithiases     S/P biliary colic cholecystostomy, but not cholecystectomy secondary to omental adhesions  . Anxiety   . Anemia     BL Hgb 8-9. Likely AOCD in setting of ESRD with iron 48, ferritin  746 (03/2010)  . PAF (paroxysmal atrial fibrillation)     On chronic coumadin therapy // Followed by Dr. Excell Seltzer (cardiology)  . Non-ST elevation MI (NSTEMI)     Noninvasive evaluation with Myoview negative for ischemia.  // 2-D echo (05/2010) LVEF 65-70%. Grade 1 diastolic dysfunction.  Peak PA pressure 52.  Marland Kitchen Hyperthyroidism DX: 07/2010    Likely Grave's Disease. // On diagnosis, TSH < 0.08, free T4 1.94, free T3 5.3 . //  Thyroid US (07/17/2010) - nonspecific diffuse heterogenous thyroid gland. //  Thyrotropin receptor antibody neg (07/17/2010)   Past Surgical History  Procedure Date  . Other surgical history 02/2009    Cholecystostomy secondary to biliary colic - NO cholecystectomy secondary to omental adhesions - by Dr. Bertram Savin   Family History  Problem Relation Age of Onset  . Stroke    . Heart attack     History   Social History  . Marital Status: Married    Spouse Name: N/A    Number of Children: 0  . Years of Education: 56 th grad   Occupational History  .     Social History Main Topics  . Smoking status: Never Smoker   . Smokeless tobacco: Not on file  . Alcohol Use: No    . Drug Use: No  . Sexually Active: Not on file   Other Topics Concern  . Not on file   Social History Narrative  . No narrative on file   Review of Systems: Negative except as noted in the HPI  Physical Exam: Vitals: Tm: 99.0 P: 140-146 BP: 123/71 Resp: 15 O2 sat 96% on RA Constitutional: Vital signs reviewed.  Patient is a thin, chronically ill appearing woman in no acute distress and cooperative with exam. Alert and oriented x3.  Head: Normocephalic and atraumatic Ear: TM normal bilaterally Mouth: no erythema or exudates, MMM Eyes: PERRL, EOMI, conjunctivae normal, No scleral icterus.  Neck: Supple, Trachea midline normal ROM, No JVD, mass, thyromegaly, or carotid bruit present.  Cardiovascular: Irregularly irregular rhythm, tachycardic rate, S1 normal, S2 normal, no MRG, pulses symmetric and intact bilaterally Pulmonary/Chest: CTAB, no wheezes, rales, or rhonchi Abdominal: Soft. Mild bilateral lower quadrant tenderness, non-distended, bowel sounds are normal, no masses, organomegaly, or guarding present.  GU: no CVA tenderness Musculoskeletal: No joint deformities, erythema, or stiffness, ROM full and no nontender Hematology: no cervical, inginal, or axillary adenopathy.  Skin: Warm, dry and intact. No rash, cyanosis, or clubbing.  Psychiatric: Mildly anxious mood and affect. speech and behavior is normal. Judgment and thought content normal. Cognition and memory are normal.    Lab results:  Admission on 08/30/2010  Component Value Range  . Sodium (mEq/L) 136  135-145  . Potassium (mEq/L) 3.5  3.5-5.1  . Chloride (mEq/L) 96  96-112  . BUN (mg/dL) 19  7-84  . Creatinine, Ser (mg/dL) 6.96* 2.95-2.84  . Glucose, Bld (mg/dL) 132* 44-01  . Calcium, Ion (mmol/L) 1.08* 1.12-1.32  . TCO2 (mmol/L) 31  0-100  . Hemoglobin (g/dL) 02.7* 25.3-66.4  . HCT (%) 34.0* 36.0-46.0   Imaging results:  Chest X-ray   IMPRESSION:   1.  Bilateral lower lobe opacities right greater than  left. This   may represent interstitial edema or multifocal infection peri   2.  Cardiac enlargement.  Assessment & Plan by Problem: 1. ATRIAL FIBRILLATION: With RVR:  Heart rate of 150's, pt had an episode during prior admission and converted with esmolol drip. patients CHADS2 score is: 2, and is currently on warfarin.  Possible triggers for patient's Afib maybe systemic infection, medication noncompliance, or hyperthyroidism, her CXR today is suggestive of PNA, this may be the trigger of her AFIB, as for hyperthyroidism as a possible cause, patient is s/p ablation, will check TSH and FT4.   -Will admit to SDU and monitor closely.   -Will start Esmolol drip, will put parameters as to not  decrease blood pressure more than 25% in 24 hours.   -  Continue home metoprolol at 50mg  BID.   -Continue coumadin per pharmacy and get daily PT,  INR. If INR is subtheraputic consider full dose heparin  to bridge.   - Blood cultures X2 prior to antibiotics   - Vanc and Zosyn per pharmacy for ? Infiltrate on   chest x-ray  2. ESRD: on TTS schedule,last session was today and was complicated by 3 episodes of hypotension.  will contact renal in AM to inform them of patients status. will check renal panel.    3. Graves disease s/p radioactive iodine treatment:  The patient missed her appointment with Dr. Lucianne Muss.  She was to have her thyroid function tested so we will do that here in the hospital and contact Dr. Lucianne Muss if they are abnormal.    4. Diarrhea:  She has no recent antibiotic exposure but has several hospitalizations over the last few months.  She has no new medications and only increased the dosage of her Metoprolol recently.  Possible causes could include C. Diff infection, gastritis, medication effects, or gastroenteritis.    - FOBT her stools x2   - C. Diff by PCR  - monitor fluid status with her ESRD    R2/3______________________________      R1________________________________  ATTENDING: I  performed and/or observed a history and physical examination of the patient.  I discussed the case with the residents as noted and reviewed the residents' notes.  I agree with the findings and plan--please refer to the attending physician note for more details.  Signature________________________________  Printed Name_____________________________

## 2010-08-31 ENCOUNTER — Emergency Department (HOSPITAL_COMMUNITY): Payer: Medicaid Other

## 2010-08-31 DIAGNOSIS — I4891 Unspecified atrial fibrillation: Secondary | ICD-10-CM

## 2010-08-31 LAB — PROTIME-INR
INR: 2.01 — ABNORMAL HIGH (ref 0.00–1.49)
INR: 2.18 — ABNORMAL HIGH (ref 0.00–1.49)
Prothrombin Time: 23.1 seconds — ABNORMAL HIGH (ref 11.6–15.2)
Prothrombin Time: 24.6 seconds — ABNORMAL HIGH (ref 11.6–15.2)

## 2010-08-31 LAB — CBC
Hemoglobin: 10.1 g/dL — ABNORMAL LOW (ref 12.0–15.0)
MCH: 26.1 pg (ref 26.0–34.0)
MCH: 26.7 pg (ref 26.0–34.0)
MCHC: 31.7 g/dL (ref 30.0–36.0)
MCHC: 32.4 g/dL (ref 30.0–36.0)
MCV: 82.5 fL (ref 78.0–100.0)
Platelets: 226 10*3/uL (ref 150–400)
RDW: 18.7 % — ABNORMAL HIGH (ref 11.5–15.5)

## 2010-08-31 LAB — DIFFERENTIAL
Basophils Relative: 0 % (ref 0–1)
Eosinophils Absolute: 0 10*3/uL (ref 0.0–0.7)
Monocytes Relative: 9 % (ref 3–12)
Neutrophils Relative %: 85 % — ABNORMAL HIGH (ref 43–77)

## 2010-08-31 LAB — CLOSTRIDIUM DIFFICILE BY PCR: Toxigenic C. Difficile by PCR: POSITIVE — AB

## 2010-08-31 LAB — BASIC METABOLIC PANEL
BUN: 21 mg/dL (ref 6–23)
Chloride: 95 mEq/L — ABNORMAL LOW (ref 96–112)
GFR calc Af Amer: 14 mL/min — ABNORMAL LOW (ref >60)
GFR calc non Af Amer: 11 mL/min — ABNORMAL LOW (ref >60)
Glucose, Bld: 99 mg/dL (ref 70–99)
Potassium: 3.5 mEq/L (ref 3.5–5.1)
Sodium: 136 mEq/L (ref 135–145)

## 2010-08-31 LAB — CARDIAC PANEL(CRET KIN+CKTOT+MB+TROPI)
CK, MB: 1.4 ng/mL (ref 0.3–4.0)
Relative Index: INVALID (ref 0.0–2.5)
Troponin I: <0.3 ng/mL (ref <0.30)
Troponin I: <0.3 ng/mL (ref <0.30)
Troponin I: <0.3 ng/mL (ref <0.30)

## 2010-08-31 LAB — T4, FREE: Free T4: 3.03 ng/dL — ABNORMAL HIGH (ref 0.80–1.80)

## 2010-08-31 LAB — TSH: TSH: <0.008 u[IU]/mL — ABNORMAL LOW (ref 0.350–4.500)

## 2010-09-01 ENCOUNTER — Inpatient Hospital Stay (HOSPITAL_COMMUNITY): Payer: Medicaid Other

## 2010-09-01 ENCOUNTER — Inpatient Hospital Stay (HOSPITAL_COMMUNITY)
Admission: RE | Admit: 2010-09-01 | Discharge: 2010-09-01 | Disposition: A | Discharge status: Home / Self Care | Payer: Medicaid Other | Source: Ambulatory Visit

## 2010-09-01 DIAGNOSIS — I4891 Unspecified atrial fibrillation: Secondary | ICD-10-CM

## 2010-09-01 DIAGNOSIS — A0472 Enterocolitis due to Clostridium difficile, not specified as recurrent: Secondary | ICD-10-CM

## 2010-09-01 LAB — BASIC METABOLIC PANEL
CO2: 28 mEq/L (ref 19–32)
Chloride: 98 mEq/L (ref 96–112)
Creatinine, Ser: 5.91 mg/dL — ABNORMAL HIGH (ref 0.50–1.10)
GFR calc Af Amer: 8 mL/min — ABNORMAL LOW (ref >60)
Potassium: 3 mEq/L — ABNORMAL LOW (ref 3.5–5.1)
Sodium: 136 mEq/L (ref 135–145)

## 2010-09-01 LAB — CBC
HCT: 29.2 % — ABNORMAL LOW (ref 36.0–46.0)
HCT: 29.4 % — ABNORMAL LOW (ref 36.0–46.0)
Hemoglobin: 9.2 g/dL — ABNORMAL LOW (ref 12.0–15.0)
Hemoglobin: 9.3 g/dL — ABNORMAL LOW (ref 12.0–15.0)
MCHC: 31.5 g/dL (ref 30.0–36.0)
MCV: 81.2 fL (ref 78.0–100.0)
RBC: 3.56 MIL/uL — ABNORMAL LOW (ref 3.87–5.11)
RBC: 3.62 MIL/uL — ABNORMAL LOW (ref 3.87–5.11)
RDW: 18.3 % — ABNORMAL HIGH (ref 11.5–15.5)
WBC: 7.1 10*3/uL (ref 4.0–10.5)
WBC: 6.5 10*3/uL (ref 4.0–10.5)

## 2010-09-01 LAB — RENAL FUNCTION PANEL
Albumin: 2.3 g/dL — ABNORMAL LOW (ref 3.5–5.2)
BUN: 31 mg/dL — ABNORMAL HIGH (ref 6–23)
CO2: 26 mEq/L (ref 19–32)
Chloride: 98 mEq/L (ref 96–112)
Creatinine, Ser: 6.26 mg/dL — ABNORMAL HIGH (ref 0.50–1.10)
GFR calc non Af Amer: 7 mL/min — ABNORMAL LOW (ref >60)
Potassium: 3 mEq/L — ABNORMAL LOW (ref 3.5–5.1)

## 2010-09-01 LAB — PROTIME-INR
INR: 2.51 — ABNORMAL HIGH (ref 0.00–1.49)
Prothrombin Time: 27.5 seconds — ABNORMAL HIGH (ref 11.6–15.2)

## 2010-09-05 ENCOUNTER — Ambulatory Visit (INDEPENDENT_AMBULATORY_CARE_PROVIDER_SITE_OTHER): Payer: Medicaid Other | Admitting: Cardiovascular Disease

## 2010-09-05 ENCOUNTER — Encounter: Payer: Self-pay | Admitting: Cardiovascular Disease

## 2010-09-05 ENCOUNTER — Ambulatory Visit (INDEPENDENT_AMBULATORY_CARE_PROVIDER_SITE_OTHER): Payer: Medicaid Other | Admitting: *Deleted

## 2010-09-05 VITALS — BP 161/57 | HR 64 | Resp 18 | Ht 63.0 in | Wt 104.0 lb

## 2010-09-05 DIAGNOSIS — I4891 Unspecified atrial fibrillation: Secondary | ICD-10-CM

## 2010-09-05 DIAGNOSIS — Z7901 Long term (current) use of anticoagulants: Secondary | ICD-10-CM

## 2010-09-05 LAB — POCT INR: INR: 5.3

## 2010-09-05 NOTE — Patient Instructions (Signed)
Your physician recommends that you schedule a follow-up appointment in: 4-6 weeks with Dr.Cooper

## 2010-09-05 NOTE — Progress Notes (Signed)
HPI:  This is a 75 year old woman presenting for followup evaluation. She was recently hospitalized with atrial fibrillation with RVR. The patient has had multiple recent hospitalizations related to atrial fib. This is been complicated by hyperthyroidism and she recently underwent radio iodine thyroid ablation. The patient has a multitude of medication intolerances. She has been able to tolerate anticoagulation with warfarin. She has taken metoprolol regularly and hold it only for low blood pressures which occur after dialysis at times. She denies chest pain or palpitations since release from the hospital last week.  Outpatient Encounter Prescriptions as of 09/05/2010  Medication Sig Dispense Refill  . Calcium Acetate 667 MG TABS Take by mouth 3 (three) times daily before meals.        . cycloSPORINE (RESTASIS) 0.05 % ophthalmic emulsion Place 1 drop into both eyes 2 (two) times daily.        . metoprolol tartrate (LOPRESSOR) 25 MG tablet Take 25 mg by mouth 2 (two) times daily. Take one PRN for palpitations.      . metroNIDAZOLE (FLAGYL) 500 MG tablet Take 500 mg by mouth 3 (three) times daily.        . nitroGLYCERIN (NITROSTAT) 0.4 MG SL tablet Place 0.4 mg under the tongue every 5 (five) minutes as needed.        . ranitidine (ZANTAC) 150 MG tablet Take 150 mg by mouth daily.        Marland Kitchen warfarin (COUMADIN) 5 MG tablet Take 1 tablet (5 mg total) by mouth as directed.  50 tablet  3    Allergies  Allergen Reactions  . Cardizem (Diltiazem Hcl) Hives  . Codeine   . Iron     ?? Almost died.  . Venofer (Iron Sucrose (Iron Oxide Saccharated)) Itching    Past Medical History  Diagnosis Date  . ESRD (end stage renal disease)     HD on Tues, Thurs, Sat // Likely secondary to nephrosclerosis from HTN  . HTN (hypertension)   . HLD (hyperlipidemia)   . Cholelithiases     S/P biliary colic cholecystostomy, but not cholecystectomy secondary to omental adhesions  . Anxiety   . Anemia     BL Hgb 8-9.  Likely AOCD in setting of ESRD with iron 48, ferritin  746 (03/2010)  . PAF (paroxysmal atrial fibrillation)     On chronic coumadin therapy // Followed by Dr. Excell Seltzer (cardiology)  . Non-ST elevation MI (NSTEMI)     Noninvasive evaluation with Myoview negative for ischemia.  // 2-D echo (05/2010) LVEF 65-70%. Grade 1 diastolic dysfunction.  Peak PA pressure 52.  Marland Kitchen Hyperthyroidism DX: 07/2010    Likely Grave's Disease. // On diagnosis, TSH < 0.08, free T4 1.94, free T3 5.3 . //  Thyroid US (07/17/2010) - nonspecific diffuse heterogenous thyroid gland. //  Thyrotropin receptor antibody neg (07/17/2010)    ROS: Negative except as per HPI  BP 161/57  Pulse 64  Resp 18  Ht 5\' 3"  (1.6 m)  Wt 104 lb (47.174 kg)  BMI 18.42 kg/m2  PHYSICAL EXAM: Pt is alert and oriented, elderly woman in NAD HEENT: normal Neck: JVP - normal, carotids 2+= without bruits Lungs: CTA bilaterally CV: RRR without murmur or gallop Abd: soft, NT, Positive BS, no hepatomegaly Ext: no C/C/E, distal pulses intact and equal Skin: warm/dry no rash  EKG:  Normal sinus rhythm heart rate 65 beats per minute, abnormal P. Wave axis, otherwise within normal limits.  ASSESSMENT AND PLAN:

## 2010-09-05 NOTE — Assessment & Plan Note (Signed)
The patient is in sinus rhythm today. I explained to the patient and her husband that I suspected this would recur especially in the setting of ongoing hyperthyroidism. Once she is euthyroid, I think her atrial fib will be much easier to control. The final antiarrhythmic consideration would be amiodarone but I am not confident that she would tolerate this drug as she has many drug intolerances. For now we'll continue anticoagulation with warfarin and continuation of metoprolol for beta blockade

## 2010-09-06 LAB — CULTURE, BLOOD (ROUTINE X 2)
Culture  Setup Time: 201206201020
Culture: NO GROWTH

## 2010-09-07 ENCOUNTER — Telehealth: Payer: Self-pay | Admitting: Cardiology

## 2010-09-07 NOTE — Telephone Encounter (Signed)
[employer] called to set up pt for a monitor and her husband said he didn't know anything about it, [employer] told him our office would contact them re this

## 2010-09-07 NOTE — Discharge Summary (Signed)
[name], [name] NO.:  [id number]  MEDICAL RECORD NO.:  [id number]  LOCATION:  2905                         FACILITY:  [facility]  PHYSICIAN:  Blanch Media, M.D.DATE OF BIRTH:  09-Mar-1936  DATE OF ADMISSION:  08/30/2010 DATE OF DISCHARGE:  09/01/2010                              DISCHARGE SUMMARY   DISCHARGE DIAGNOSES: 1. Atrial fibrillation with rapid ventricular response, resolved at     discharge. 2. End-stage renal disease, on hemodialysis. 3. Graves disease, status post radioactive iodine treatment. 4. Diarrhea 2/2 c.diff sent home on antibiotics 5. Hypertension. 6. Anxiety. 7. Hyperlipidemia. 8. History of coronary artery disease.  DISCHARGE MEDICATIONS: 1. Darbepoetin 60 mcg, 0.3 mL injection intravenously     every Tuesday with hemodialysis. 2. Metoprolol 25 mg tablet take 1 tablet by mouth twice daily. 3. Metronidazole 500 mg tablet take 1 tablet 3 times a day for a total     of 9 days. 4. Paricalcitol 5 mcg/mL injection 2 mcg intravenously with dialysis. 5. Tylenol 325 mg tablet take 1 tablet by mouth every 6 hours as     needed for headaches or fever. 6. Calcium acetate 667 mg 1 capsule by mouth 3 times a day with     meals. 7. Ranitidine 150 mg 1 tablet by mouth daily at bedtime. 8. Restasis cyclosporine ophthalmic 0.05% 1 drop each eye twice daily.  DISPOSITION AND FOLLOWUP: 1. The patient has a followup appointment with Dr. Reather Littler on     October 19, 2010, at 2:45 p.m.  Dr. Remus Blake office was requested to     call the patient up if they have an earlier appointment available.     Dr. Lucianne Muss is requested to follow the patient's hyperthyroidism. 2. The patient has an appointment with Dr. Excell Seltzer on September 05, 2010, at     2:45 p.m.  Dr. Excell Seltzer is to follow up the patient's atrial     fibrillation and coronary artery disease.  PROCEDURES PERFORMED DURING HOSPITALIZATION: 1. The patient had a chest x-ray done on September 08, 2010.   Impression:     a.     Bilateral lower lobe opacities, right greater than left.      This may present interstitial edema or multifocal infection.     b.     Cardiac enlargement. 2. The patient had a repeat chest x-ray 2-view done on September 01, 2010.     Impression:  Moderate enlargement of the cardiac silhouette.     Cardiac silhouette is smaller than on previous study.  There is     interval decrease in infiltrative opacities and atelectasis in the     right lung base since previous study.  There may be small amount of     right pleural effusion.  Subsegmental atelectasis or fibrosis is     seen in the left base, but the hazy infiltrative densities on the     left have improved since previous study.  No new lesions are seen.  CONSULTATIONS:  There were no new consultations done during hospitalization.  BRIEF ADMITTING HISTORY AND PHYSICAL:  The patient is a 75 year old woman who presented to the [facility] Emergency Department because of palpitations  and diarrhea for the last 3 days.  She was recently released from [facility] because of AFib with RVR and was followed by Dr. Excell Seltzer from [facility].  Her AFib was thought to be secondary to uncontrolled hyperthyroidism which she is now status post radioactive iodine therapy.  She is being followed by Dr. Lucianne Muss as outpatient.  She states that for the last 3 days she has had diarrhea and fluttering in her chest.  The fluttering has been persistent, but not painful.  She denies any diaphoresis, shortness of breath, dizziness, nausea, vomiting, increased swelling in her legs, or chest pain.  She also has had 5-6 watery bowel movements daily for the last 3 days.  She states that whenever she eats anything she has to have a bowel movement immediately after that.  She has not been eating much because of the symptoms and has also been experiencing crampy abdominal pain constantly.  She denies any blood in her stool, vomiting,  dark- tarry stools, or particularly odiferous.  In the ER, she was started on an esmolol drip which had been successful in converting her in her previous admissions to a normal sinus rhythm.  PHYSICAL EXAMINATION:  VITAL SIGNS:  Temperature of 99.0, pulse 140-146, blood pressure 123/71, respirations 15, and oxygen saturation 96% on room air. CONSTITUTIONAL:  Vital signs reviewed.  The patient is a thin, chronically ill-appearing woman in no acute distress and cooperative with exam.  Alert and oriented x3. HEAD:  Normocephalic and atraumatic. EARS:  TMs normal bilaterally. MOUTH:  No erythema or exudates.  Mucous moist membranes. EYES:  Pupils are equal, round, and reactive to light.  Extraocular movements are intact.  Conjunctivae are normal.  No scleral icterus. NECK:  Supple.  Trachea midline.  Normal range of motion.  No JVD, mass, thyromegaly, or carotid bruit present. CARDIOVASCULAR:  Irregularly irregular rhythm, tachycardic rate, S1 normal, S2 normal.  No murmurs, rubs, or gallops.  Pulses are symmetric and intact bilaterally. PULMONARY:  Her chest is clear to auscultation bilaterally.  No wheezes, rales, or rhonchi. ABDOMEN:  Soft, mild bilateral lower quadrant tenderness, nondistended. Bowel sounds are normal.  No masses, organomegaly, or guarding present. GU:  No CVA tenderness. MUSCULOSKELETAL:  No joint deformities, erythema, or stiffness.  Range of motion full and nontender. HEMATOLOGY:  No cervical, inguinal, or axillary adenopathy. SKIN:  Warm, dry, and intact.  No rash, cyanosis, or clubbing. PSYCHIATRIC:  Mildly anxious mood and affect.  Speech and behavior is normal.  Judgment and thought content normal.  Cognition and memory are normal.  HOSPITAL COURSE BY PROBLEM: 1. Atrial fibrillation with RVR:  The patient had a heart rate of 140s-     150s at admission.  The patient has had several admissions for AFib     with RVR in the past due to noncompliance with her  metoprolol.  The     patient thinks that her metoprolol makes her to have loose bowel     movements.  The patient was started on warfarin as her home dose.     The possible trigger for atrial fibrillation at this time was     possible suspicion of infection in her colon.  Some of the other     factors which were considered were medication noncompliance and     hyperthyroidism. The patient continued to do better after esmolol drip was     started and was admitted in ICU where she reverted back to  atrial fibrillation.  The esmolol drip was discontinued on day #2     of admission.  The patient was discharged home completely     asymptomatic and in normal sinus rhythm on September 01, 2010, after her     dialysis treatment.  The patient was given a followup appointment     with Dr. Excell Seltzer and was advised to complete her course of     antibiotics and also take her other medications as prescribed.. 2. End-stage renal disease:  The patient was continued on end-stage     renal disease hemodialysis, CT as scheduled and had hemodialysis     before being discharged on September 01, 2010. 3. Graves disease, status post radioactive iodine treatment: She missed her appointment with Dr. Lucianne Muss.  We continued the patient on her home dose of beta-blockers and was advised to follow     up with Dr. Lucianne Muss.  The patient needs to get a thyroid function     test as an outpatient so that it can be managed accordingly.. 4. Diarrhea:  We sent the patient's stool for PCR for C. diff which     came back positive.  The patient has had no recent antibiotic     exposure, but has had several hospitalizations over the last few     months which was considered to be one of the risk factors for C.     diff.  The patient was started on metronidazole and was ultimately discharged on a total     of 10-day course of metronidazole. 5. The patient's other medical problems continued to be stable and did     not require any active  intervention.  DISCHARGE LABORATORY DATA AND VITAL SIGNS:  Temperature of 98.6, pulse 71, respirations 18, systolic blood pressure 130, diastolic blood pressure 52, and oxygen saturation 92% on room air. Sodium (NA)135 Potassium (K)3.0 Chloride 98 CO2 26 Glucose 114 BUN 31  Creatinine 6.26 Albumin-Blood  2.3 Calcium 8.7 Phosphorus  4.3 WBC 6.5 Hemoglobin 9.3  Platelets 207.   Thank you for your assistance.     Lars Mage, MD   ______________________________ Blanch Media, M.D.    AG/MEDQ  D:  09/02/2010  T:  09/03/2010  Job:  [id number]  cc:   Reather Littler, MD [place]. Excell Seltzer, MD  Electronically Signed by Lars Mage  on 09/07/2010 11:43:52 AM Electronically Signed by Blanch Media M.D. on 09/07/2010 12:03:10 PM

## 2010-09-08 NOTE — Consult Note (Signed)
[name], [name] NO.:  [id number]  MEDICAL RECORD NO.:  [id number]  LOCATION:                                 FACILITY:  PHYSICIAN:  Therisa Doyne, MD    DATE OF BIRTH:  07-08-1935  DATE OF CONSULTATION: DATE OF DISCHARGE:                                CONSULTATION   PRIMARY CARDIOLOGIST:  Veverly Fells. Excell Seltzer, MD.  PRIMARY CARE PROVIDER:  Dr. Aundria Rud at the [facility].  CHIEF COMPLAINT:  Recurrent atrial fibrillation with rapid ventricular response.  [name] is a 75 year old white female with past medical history significant for end-stage renal disease, on hemodialysis, hyperthyroidism, status post recent radioactive iodine ablation this week and paroxysmal atrial fibrillation, who presents with recurrent atrial fibrillation with rapid ventricular response.  Historically, she has been hospitalized multiple times over the past couple months with recurrent AFib.  Her management has been somewhat difficult because of her end-stage renal disease and hyperthyroidism both which limited use of the majority of antiarrhythmic drugs.  She is also allergic to diltiazem, because of this, she has been treated with Lopressor 25 mg b.i.d. to t.i.d.  She has also maintained on anticoagulation.  She was doing well until earlier today around noon.  She developed recurrent tachy palpitations.  There is a mild uncomfortable sensation in her chest, but otherwise she is asymptomatic.  She denies any shortness of breath or PND, orthopnea.  She denies any lower extremity edema.  She denies any syncope.  In the emergency room, she was noted to be hypokalemic with a potassium of 3.3 and also had a new right-sided pleural effusion.  PAST MEDICAL HISTORY: 1. Paroxysmal atrial fibrillation, maintained on chronic     anticoagulation and rate control therapy. 2. Hyperthyroidism, diagnosed May 2012, status post radioactive iodine     ablation this week. 3.  Remote history of non-ST segment elevation myocardial infarction in     July 2011.  No calf secondary to renal disease.  Troponin was 17. 4. Diastolic heart failure with volume management by dialysis. 5. End-stage renal disease on hemodialysis Tuesday, Thursday, and     Saturdays by AV fistula in the left upper extremity. 6. History of stroke in the remote past. 7. Hypertension. 8. Anticoagulation with Coumadin started May 2012. 9. Anemia. 10.Family history of coronary artery disease.  SOCIAL HISTORY:  The patient lives in [place] with her husband.  She denies tobacco, alcohol, or drug use.  His family history is negative for premature coronary artery disease.  ALLERGIES: 1. Codeine. 2. IVI. 3. Cardizem.  HOME MEDICATIONS: 1. Metoprolol 25 mg b.i.d. 2. Zemplar with dialysis. 3. Calcium acetate t.i.d. with meals. 4. Zantac 150 mg at bedtime. 5. Coumadin 5 mg daily.  REVIEW OF SYSTEMS:  All systems are reviewed and negative except as mentioned above in the history of present illness.  PHYSICAL EXAM:  VITAL SIGNS:  Temperature afebrile, blood pressure is 140/80, heart rate is 130 and irregular, respirations 18, oxygen saturation 99% on room air. GENERAL:  No acute distress. HEENT:  Normocephalic, atraumatic.  Pupils are equal, round, reactive to light, and accommodation.  Oropharynx, pink and moist without lesions. NECK:  Supple.  No lymphatic.  No lymphadenopathy.  There is 7-cm of jugular distention. CARDIOVASCULAR:  Tachycardic, irregularly irregular rhythm.  No murmurs. LUNGS:  Decreased breath sounds at the right base. ABDOMEN:  Positive bowel sounds, nontender, nondistended. EXTREMITIES:  No clubbing, cyanosis, or edema.  Dorsalis pedis pulse 2+ bilaterally. SKIN:  No rashes. BACK:  No CVA tenderness. PSYCH:  Normal affect. NEUROLOGIC:  No focal deficits.  PERTINENT LAB DATA:  CBC normal.  INR 2.3, troponin less than 0.3, potassium 3.3, BUN 17, creatinine  3.4.  Chest x-ray showed right pleural effusion.  Telemetry demonstrated atrial fibrillation with rapid ventricular response at 130 beats per minute.  IMPRESSION AND PLAN:  A 75 year old white female with past medical history significant for paroxysmal atrial fibrillation, maintained on Coumadin beta-blocker therapy, end-stage renal disease on hemodialysis and hyperthyroidism, status post recent radioactive iodine ablation this week.  He presents with recurrent atrial fibrillation with rapid ventricular response.  Of note, she was found to be hypokalemic and has a new right pleural effusion. 1. Atrial fibrillation with  rapid ventricular response.  It is     unclear whether or not this is exacerbated by her hypokalemia or     her right pleural effusion.  Regardless, I think that early options     are rate control therapy.  She is not a good candidate for     antiarrhythmic drug therapy as amiodarone has significant side     effects and her end-stage renal disease precludes the use of other     common antiarrhythmic drugs.  Additionally, Cardizem is not an     option because she is allergic to it.  For acute rate control, we     will start her on IV esmolol drip.  She is with hopes of     controlling her rate.  I would recommend holding her Coumadin as     her INR is 2.3 and when it is less than 2, I will start IV heparin     drip.  I would do this because she may need undergo thoracentesis.     I would recommend considering a CT scan of the chest, systemic sure     that this is not a loculated or complicated     effusion, make sure there is no mass, I would recommend repleting     her potassium, cycling cardiac enzymes.  [facility] will     follow along in consultation.  Thank you for allowing [place] to participate in the care of this patient.     Therisa Doyne, MD     SJT/MEDQ  D:  08/19/2010  T:  08/19/2010  Job:  [id number]  Electronically Signed by Aldona Bar MD  on 09/08/2010 10:11:32 PM

## 2010-09-11 NOTE — Consult Note (Signed)
NAMEMarland Hines  DONDA, [name] NO.:  [id number]  MEDICAL RECORD NO.:  [id number]  LOCATION:  [facility]                         FACILITY:  [facility]  PHYSICIAN:  Wendi Snipes, MD DATE OF BIRTH:  Feb 06, 1936  DATE OF CONSULTATION:  08/30/2010 DATE OF DISCHARGE:                                CONSULTATION   CARDIOLOGIST:  Veverly Fells. Excell Seltzer, MD  PRIMARY CARE DOCTOR:  Dr. Aundria Rud at [facility].  CHIEF COMPLAINT:  Palpitations.  HISTORY OF PRESENT ILLNESS:  This is a 75 year old female with a history of end-stage renal disease on hemodialysis and hypothyroidism and paroxysmal atrial fibrillation who presents with recurrent palpitations starting at 8 p.m. this evening.  She states that she was just discharged on the 14th after admission for palpitations, atrial fibrillation with RVR and found to have pleural effusion.  After management and diuresis with improvement of her pleural effusion, she was discharged.  Since leaving the hospital, she has experienced diarrhea and stomach cramps over the past few days.  She noted palpitations starting this evening and otherwise reports no chest pain or syncope, increased lower extremity edema, paroxysmal nocturnal dyspnea, or orthopnea.  She did have hemodialysis today and went from 47 kg to 46 kg which is her dry weight.  PAST MEDICAL HISTORY: 1. Paroxysmal atrial fibrillation, complicated by rapid ventricular     response. 2. Right pleural effusion. 3. Hyperthyroidism. 4. Hypertension. 5. Hyperlipidemia. 6. End-stage renal disease, on hemodialysis. 7. History of NSTEMI.  ALLERGIES:  DILTIAZEM and IVP DYE.  MEDICATIONS ON ADMISSION: 1. Tylenol as needed. 2. Darbepoetin with hemodialysis. 3. Metoprolol 50 mg twice daily. 4. Zemplar injections on Tuesday, Thursday, and Saturday. 5. Calcium acetate daily. 6. Ranitidine 150 mg daily. 7. Restasis eye drops twice daily. 8. Coumadin 5 mg every morning.  SOCIAL  HISTORY:  She lives in [place] with her husband.  She does not work.  She does not smoke.  FAMILY HISTORY:  Negative for premature coronary artery disease.  REVIEW OF SYSTEMS:  All 14 systems were reviewed and were negative except as mentioned detail in the HPI.  PHYSICAL EXAMINATION:  VITAL SIGNS:  Blood pressure is 147/79, respiratory rate 16, and pulse is 145.  She is saturating 95% on room air. GENERAL:  She is a 75 year old female appearing stated age, in no acute distress. HEENT:  She has dry mucous membranes. NECK:  No jugular venous distention.  No thyromegaly. CARDIOVASCULAR:  Irregularly irregular, tachycardic.  No murmurs, rubs, or gallops. LUNGS:  Decreased breath sounds. ABDOMEN:  Increased bowel sounds.  Diffuse mild tenderness to palpation without rebound. EXTREMITIES:  No clubbing, cyanosis, edema. NEUROLOGIC:  Alert and oriented x3.  Cranial nerves II-XII grossly intact.  No focal neurologic deficit.  RADIOLOGY:  Chest x-ray is currently pending.  EKG showed atrial fibrillation with rapid ventricular response and nonspecific ST-T wave changes.  LABORATORY REVIEW:  Hematocrit 34.4, potassium 3.5, and creatinine is 3.5.  ASSESSMENT/PLAN:  Assessment:  This is a 75 year old with end-stage renal disease, on hemodialysis, here with recurrent paroxysmal atrial fibrillation with rapid ventricular response. 1. Atrial fibrillation.  It is likely secondary to another systemic     process.  She may be  suffering from a GI illness which is     subsequently promoting volume depletion and excess catecholamines     which is driving her atrial fibrillation.  We will start esmolol     drip now and consider increasing her p.o. beta-blockers.  Consider     other more aggressive therapies for rate control as she does not     appear to be able to maintain rate control with her doses of beta-     blocker.  Additionally, check thyroid function tests.  We will     continue to  follow her closely.     Wendi Snipes, MD     BHH/MEDQ  D:  08/31/2010  T:  08/31/2010  Job:  [id number]  Electronically Signed by Jim Desanctis MD on 09/11/2010 10:07:52 AM

## 2010-09-12 ENCOUNTER — Ambulatory Visit (HOSPITAL_COMMUNITY)
Admission: RE | Admit: 2010-09-12 | Discharge: 2010-09-12 | Disposition: A | Discharge status: Home / Self Care | Payer: Medicaid Other | Source: Ambulatory Visit | Attending: Nephrology | Admitting: Nephrology

## 2010-09-12 ENCOUNTER — Other Ambulatory Visit (HOSPITAL_COMMUNITY): Payer: Self-pay | Admitting: Nephrology

## 2010-09-12 ENCOUNTER — Ambulatory Visit (INDEPENDENT_AMBULATORY_CARE_PROVIDER_SITE_OTHER): Payer: Medicaid Other | Admitting: *Deleted

## 2010-09-12 DIAGNOSIS — J90 Pleural effusion, not elsewhere classified: Secondary | ICD-10-CM | POA: Insufficient documentation

## 2010-09-12 DIAGNOSIS — I517 Cardiomegaly: Secondary | ICD-10-CM | POA: Insufficient documentation

## 2010-09-12 DIAGNOSIS — Z7901 Long term (current) use of anticoagulants: Secondary | ICD-10-CM

## 2010-09-12 DIAGNOSIS — I4891 Unspecified atrial fibrillation: Secondary | ICD-10-CM

## 2010-09-12 DIAGNOSIS — R059 Cough, unspecified: Secondary | ICD-10-CM | POA: Insufficient documentation

## 2010-09-12 DIAGNOSIS — Z09 Encounter for follow-up examination after completed treatment for conditions other than malignant neoplasm: Secondary | ICD-10-CM | POA: Insufficient documentation

## 2010-09-12 DIAGNOSIS — R05 Cough: Secondary | ICD-10-CM | POA: Insufficient documentation

## 2010-09-12 LAB — POCT INR: INR: 1.8

## 2010-09-20 ENCOUNTER — Telehealth: Payer: Self-pay | Admitting: Cardiovascular Disease

## 2010-09-20 ENCOUNTER — Emergency Department (HOSPITAL_COMMUNITY): Payer: Medicaid Other

## 2010-09-20 ENCOUNTER — Inpatient Hospital Stay (HOSPITAL_COMMUNITY)
Admission: EM | Admit: 2010-09-20 | Discharge: 2010-09-24 | DRG: 286 | Disposition: A | Discharge status: Home / Self Care | Payer: Medicaid Other | Attending: Cardiovascular Disease | Admitting: Cardiovascular Disease

## 2010-09-20 DIAGNOSIS — N2581 Secondary hyperparathyroidism of renal origin: Secondary | ICD-10-CM | POA: Diagnosis present

## 2010-09-20 DIAGNOSIS — Z992 Dependence on renal dialysis: Secondary | ICD-10-CM

## 2010-09-20 DIAGNOSIS — Z8249 Family history of ischemic heart disease and other diseases of the circulatory system: Secondary | ICD-10-CM

## 2010-09-20 DIAGNOSIS — I252 Old myocardial infarction: Secondary | ICD-10-CM

## 2010-09-20 DIAGNOSIS — R079 Chest pain, unspecified: Secondary | ICD-10-CM

## 2010-09-20 DIAGNOSIS — E785 Hyperlipidemia, unspecified: Secondary | ICD-10-CM | POA: Diagnosis present

## 2010-09-20 DIAGNOSIS — D649 Anemia, unspecified: Secondary | ICD-10-CM | POA: Diagnosis present

## 2010-09-20 DIAGNOSIS — I495 Sick sinus syndrome: Secondary | ICD-10-CM | POA: Diagnosis present

## 2010-09-20 DIAGNOSIS — J90 Pleural effusion, not elsewhere classified: Secondary | ICD-10-CM | POA: Diagnosis present

## 2010-09-20 DIAGNOSIS — I120 Hypertensive chronic kidney disease with stage 5 chronic kidney disease or end stage renal disease: Secondary | ICD-10-CM | POA: Diagnosis present

## 2010-09-20 DIAGNOSIS — Z7901 Long term (current) use of anticoagulants: Secondary | ICD-10-CM

## 2010-09-20 DIAGNOSIS — I4891 Unspecified atrial fibrillation: Principal | ICD-10-CM | POA: Diagnosis present

## 2010-09-20 DIAGNOSIS — Z79899 Other long term (current) drug therapy: Secondary | ICD-10-CM

## 2010-09-20 DIAGNOSIS — N186 End stage renal disease: Secondary | ICD-10-CM | POA: Diagnosis present

## 2010-09-20 DIAGNOSIS — A0472 Enterocolitis due to Clostridium difficile, not specified as recurrent: Secondary | ICD-10-CM | POA: Diagnosis not present

## 2010-09-20 LAB — PROTIME-INR
INR: 2.04 — ABNORMAL HIGH (ref 0.00–1.49)
Prothrombin Time: 23.4 seconds — ABNORMAL HIGH (ref 11.6–15.2)

## 2010-09-20 LAB — CBC
HCT: 38.8 % (ref 36.0–46.0)
MCV: 86.6 fL (ref 78.0–100.0)
RBC: 4.48 MIL/uL (ref 3.87–5.11)
RDW: 23.2 % — ABNORMAL HIGH (ref 11.5–15.5)
WBC: 6 10*3/uL (ref 4.0–10.5)

## 2010-09-20 LAB — DIFFERENTIAL
Basophils Relative: 1 % (ref 0–1)
Eosinophils Absolute: 0.4 10*3/uL (ref 0.0–0.7)
Lymphocytes Relative: 10 % — ABNORMAL LOW (ref 12–46)
Neutro Abs: 4.3 10*3/uL (ref 1.7–7.7)

## 2010-09-20 LAB — POCT I-STAT, CHEM 8
BUN: 24 mg/dL — ABNORMAL HIGH (ref 6–23)
Creatinine, Ser: 4.6 mg/dL — ABNORMAL HIGH (ref 0.50–1.10)
Glucose, Bld: 103 mg/dL — ABNORMAL HIGH (ref 70–99)
Sodium: 142 mEq/L (ref 135–145)
TCO2: 28 mmol/L (ref 0–100)

## 2010-09-20 LAB — CARDIAC PANEL(CRET KIN+CKTOT+MB+TROPI): CK, MB: 2.3 ng/mL (ref 0.3–4.0)

## 2010-09-20 LAB — TROPONIN I: Troponin I: <0.3 ng/mL (ref <0.30)

## 2010-09-20 NOTE — Telephone Encounter (Signed)
Ward Givens, NP aware.

## 2010-09-20 NOTE — Discharge Summary (Signed)
[name], [name] NO.:  [id number]  MEDICAL RECORD NO.:  [id number]  LOCATION:  2013                         FACILITY:  [facility]  PHYSICIAN:  Ileana Roup, M.D.  DATE OF BIRTH:  December 17, 1935  DATE OF ADMISSION:  08/19/2010 DATE OF DISCHARGE:  08/25/2010                              DISCHARGE SUMMARY   ATTENDING AT DISCHARGE:  Ileana Roup, MD  DISCHARGE DIAGNOSES: 1. Atrial fibrillation with rapid ventricular response. 2. Right pleural effusion. 3. History of hypothyroidism. 4. Hypertension. 5. History of non-ST elevation myocardial infarction. 6. Anxiety. 7. Hyperlipidemia.  DISCHARGE MEDICATIONS: 1. Tylenol 325 mg tablets take 2 tablets every 6 hours as needed for     pain or fever. 2. Darbepoetin 60 mcg/0.3 mL injection 60 mcg IV during dialysis on     Tuesdays. 3. Metoprolol 50 mg tablets take 1 tablet by mouth every 12 hours. 4. Zemplar 5 mcg/mL injection 2 mcg IV Tuesday, Thursday, Saturdays at     12 noon. 5. Calcium acetate 667 mg capsule 1 capsule by mouth 3 times daily     between meals. 6. Ranitidine 150 mg tablets take 1 tablet by mouth daily at bedtime. 7. Restasis 1 capsule in both eyes twice daily. 8. Coumadin 5 mg tablets take 1 tablet by mouth every evening.  DISPOSITION AND FOLLOWUP:  The patient is scheduled to follow up in the outpatient clinic at [facility] on August 29, 2010, at 1:15 p.m.  During this visit, the patient should be assessed for further episodes of AFib with RVR.  The patient's blood pressure should also be further assessed to ensure that her blood pressure and pulse are tolerating her increased dose of beta-blocker.  The patient is scheduled to follow up with Dr. Lucianne Muss, Endocrinology on August 30, 2010, at 2:45 p.m.  At this visit, the patient should have repeat thyroid function panel performed and further management of her hyperthyroidism now status post iodine ablation. The patient is scheduled to follow up  with Dr. Excell Seltzer of [facility] on September 05, 2010, at 4:15 p.m.  At this visit, the patient should be further considered for adjustment in her medications depending on her propensity to develop AFib with RVR on her increased metoprolol dose.  The patient is scheduled with [facility] for further management of her INR on August 29, 2010, at 4 p.m.  At the time of her outpatient clinic followup, the patient should have a repeat chest x-ray to further evaluate her pleural effusion.  PROCEDURES PERFORMED: 1. One-view portable chest x-ray was performed on August 19, 2010, which     demonstrated an interval development of a diffuse opacity over the     right lower chest consistent with pleural effusion and basilar     atelectasis/consolidation. 2. CT angio of the chest was performed on August 19, 2010, which     demonstrated a large right pleural effusion with associated lower     lobe atelectasis. 3. One-view portable chest x-ray was performed on August 21, 2010, which     demonstrated stable chest radiograph with moderate-to-large right     pleural effusion. 4. Shuntogram was performed on August 23, 2010. 5.  One-view portable chest x-ray was performed on August 23, 2010, which     demonstrated improvement in the right pleural effusion. 6. One-view portable chest x-ray was performed on August 25, 2010, which     demonstrated decreased right pleural effusion and improved right     lung ventilation. 7. Shuntogram was ordered on August 25, 2010, but this was refused by     the patient.  CONSULTATIONS:  Renal, Cardiology.  BRIEF ADMITTING HISTORY AND PHYSICAL:  This is a 75 year old female with a past medical history of end-stage renal disease, hypertension, an NSTEMI in 2011, as well as paroxysmal atrial fibrillation on chronic Coumadin therapy, who presented to [facility] for evaluation of palpitations that began earlier on the day of admission following her hemodialysis session.  The  patient currently denies having any chest discomfort initially on presentation, however, she subsequently developed chest discomfort while in the ED.  However, she denied having any excessive shortness of breath, paroxysmal or syncopal episodes, fevers, chills, nausea, vomiting, weakness, or diarrhea.  While in the ED, the patient was found in AFib with RVR.  Chest x-ray obtained also demonstrated developing right lung pleural effusion associated with a pneumonia versus atelectasis.  Of note, this is the patient's third admission for AFib with RVR with the recurrent episodes thought to be secondary to uncontrolled hyperthyroidism.  During her previous 2 admissions, however, she was rate controlled with metoprolol and discharged on 25 mg t.i.d., for which the patient states she has been compliant.  MEDICATIONS ON ADMISSION:  Calcium acetate, Restasis, metoprolol tartrate, nitroglycerin, ranitidine, Coumadin.  ALLERGIES:  CARDIZEM, CODEINE, IODINE, VENOFER.  PAST MEDICAL HISTORY: 1. End-stage renal disease. 2. Hypertension. 3. Hyperlipidemia. 4. Cholelithiasis. 5. Anxiety. 6. Anemia. 7. Paroxysmal atrial fibrillation. 8. NSTEMI. 9. Hyperthyroidism, now status post radioactive iodine ablation.  PHYSICAL EXAMINATION:  VITAL SIGNS:  On admission, temperature 98.4, pulse 151, respiratory rate 20, blood pressure 140/77, sating at 98% on 2 L by nasal cannula. GENERAL:  Alert, cooperative to examination. HEENT:  Head; normocephalic, atraumatic. NECK:  No deformities, masses, or tenderness.  Supple.  No carotid bruits or JVD. LUNGS:  Bilaterally clear to auscultation without crackles or wheezes. HEART:  Irregularly irregular rate and rhythm.  Normal S1 and S2 without murmurs, gallops, or rubs. ABDOMEN:  Soft, nondistended, nontender. NEUROLOGIC:  Cranial nerves II through XII grossly intact.  Alert and oriented x3.  LABORATORY DATA ON ADMISSION:  White blood cell count 7.5,  hemoglobin 12.7, hematocrit 40.5, platelet count 223.  Basic metabolic panel; sodium 138, potassium 3.3, chloride 100, bicarb 30, BUN of 17, creatinine of 3.41.  INR 2.26.  Cardiac enzymes negative.  HOSPITAL COURSE: 1. AFib with RVR:  This has been recurrent.  This is the patient's     fourth admission for AFib with RVR.  This has previously been felt     to be secondary to the patient's uncontrolled hyperthyroidism,     however, she is now status post ablation and continues to have     episodes of AFib with RVR.  Cardiology has been consulted at each     admission as well as Electrophysiology.  During this admission, the     patient's metoprolol was increased to 25 mg b.i.d. and at the time     of discharge, she had been in normal sinus rhythm for greater than     48 hours.  The patient was initially admitted in AFib with RVR with     heart rate  in the 150s and was not able to be converted with     metoprolol and as such, an esmolol drip was started and the patient     was admitted to the step-down unit.  By hospital day 2, the patient     converted into normal sinus rhythm with heart rate in the 50s.  The     esmolol drip was discontinued and the patient was started back on     her home dose of Lopressor.  The patient's Lopressor was     subsequently increased to 50 mg q.12 h. instead of 25 mg t.i.d.  We     did have extensive discussions with Cardiology who continued to     wish to hold off on the use of amiodarone.  They recommended that     we discharge the patient on her current dose of Lopressor.  They     did state that if the patient does return again for AFib with RVR,     they may have no other choice, but to initiate therapy with     amiodarone, but we will consider doing so at that time.  I will     have the patient follow up with Dr. Excell Seltzer soon for further     evaluation and consideration of new medications depending upon how     likely she is to develop AFib with RVR at  discharge.  At the time     of her discharge, the patient was in improved and stable condition     and it was felt safe to discharge her home.  The patient's Coumadin     had been held for thoracentesis for problem #2 and thus, she was     subtherapeutic on her INR at discharge.  I did discuss the matter     with Dr. Excell Seltzer who felt that it was safe to restart her on her     home dose of Coumadin and allow her to drift up without heparin     bridging.  We did discuss the risks of doing so with the patient     and I did offer her the potential to bridge with Lovenox, however,     she did decide to decline this.  As such, she was discharged on her     home dose of Coumadin for her AFib given that she has never had a     history of stroke or any valve replacement. 2. Right pleural effusion.  This had developed since prior imaging     from her previous hospitalization and was a quite acute onset.     There was significant concern that this was secondary to the fact     that she was over her dry weight and there was concern that this     was a simple transudate secondary to her end-stage renal disease.     We did hold the patient's Coumadin and start her on heparin to     allow IR to perform a thoracentesis, for which, they were     consulted.  Unfortunately, by the time the patient's INR was     therapeutic, she was refusing to have the procedure performed, so     this consultation was cancelled.  Fortunately, upon repeat x-ray     examinations while the patient was receiving serial dialysis     sessions to try to decrease her dry weight, her pleural effusion  had continued to decrease.  The patient will need a repeat chest x-     ray in the next week or so to further evaluate the effusion and     determine whether or not any further workup needs to be performed. 3. Hyperthyroidism:  This has been treated by Dr. Lucianne Muss on her last     admission with radioactive iodine ablation.  The patient  tolerated     the procedure well and only had mild neck soreness which had     resolved by the time she was discharged during this stay.  The     patient will have followup for further management of this with Dr.     Lucianne Muss on August 30, 2010. 4. Hypertension:  The patient has been on numerous blood pressure     medications in the past and has numerous intolerances.  Blood     pressures were not uncommonly in the 150-160 range during this     hospitalization, however, we did not elect to start a new     antihypertensive currently and wished to give her thyroid function     time to stabilize before starting a new antihypertensive.  I will     defer further management of the patient's hypertension to her PCP     who has been dealing with this for quite some time.  DISCHARGE VITAL SIGNS:  At the time of her discharge, the patient's temperature was 98.5, blood pressure 150/64, heart rate 62, respirations 18, she was sating at 98% on room air.     Sinda Du, MD   ______________________________ Ileana Roup, M.D.    BB/MEDQ  D:  08/25/2010  T:  08/26/2010  Job:  [id number]  cc:   Veverly Fells. Excell Seltzer, MD Reather Littler, M.D.  Electronically Signed by Sinda Du MD on 09/11/2010 10:31:49 AM Electronically Signed by Margarito Liner M.D. on 09/20/2010 05:55:07 PM

## 2010-09-20 NOTE — Telephone Encounter (Signed)
Per pt hubsand calling wanted Dr. Excell Seltzer to know his wife going to ER.

## 2010-09-20 NOTE — Telephone Encounter (Signed)
Pt husband - "pt's heartbeat is very high" and he took her to the ED at [facility]

## 2010-09-21 DIAGNOSIS — I214 Non-ST elevation (NSTEMI) myocardial infarction: Secondary | ICD-10-CM

## 2010-09-21 LAB — CBC
HCT: 41.6 % (ref 36.0–46.0)
MCH: 27.8 pg (ref 26.0–34.0)
MCV: 87 fL (ref 78.0–100.0)
RDW: 23.6 % — ABNORMAL HIGH (ref 11.5–15.5)
WBC: 9.3 10*3/uL (ref 4.0–10.5)

## 2010-09-21 LAB — CARDIAC PANEL(CRET KIN+CKTOT+MB+TROPI)
CK, MB: 2 ng/mL (ref 0.3–4.0)
Relative Index: INVALID (ref 0.0–2.5)
Total CK: 20 U/L (ref 7–177)

## 2010-09-21 LAB — RENAL FUNCTION PANEL
CO2: 20 mEq/L (ref 19–32)
Calcium: 9.1 mg/dL (ref 8.4–10.5)
GFR calc Af Amer: 9 mL/min — ABNORMAL LOW (ref >60)
GFR calc non Af Amer: 7 mL/min — ABNORMAL LOW (ref >60)
Phosphorus: 7.1 mg/dL — ABNORMAL HIGH (ref 2.3–4.6)
Potassium: 4.1 mEq/L (ref 3.5–5.1)
Sodium: 141 mEq/L (ref 135–145)

## 2010-09-21 LAB — CLOSTRIDIUM DIFFICILE BY PCR: Toxigenic C. Difficile by PCR: POSITIVE — AB

## 2010-09-21 LAB — POCT ACTIVATED CLOTTING TIME: Activated Clotting Time: 149 seconds

## 2010-09-22 ENCOUNTER — Encounter: Payer: Medicaid Other | Admitting: *Deleted

## 2010-09-22 ENCOUNTER — Inpatient Hospital Stay (HOSPITAL_COMMUNITY): Payer: Medicaid Other

## 2010-09-22 LAB — RENAL FUNCTION PANEL
Albumin: 2.7 g/dL — ABNORMAL LOW (ref 3.5–5.2)
Chloride: 99 mEq/L (ref 96–112)
Creatinine, Ser: 6.97 mg/dL — ABNORMAL HIGH (ref 0.50–1.10)
GFR calc non Af Amer: 6 mL/min — ABNORMAL LOW (ref >60)
Potassium: 4 mEq/L (ref 3.5–5.1)
Sodium: 138 mEq/L (ref 135–145)

## 2010-09-22 LAB — CBC
Platelets: 190 10*3/uL (ref 150–400)
RDW: 23.4 % — ABNORMAL HIGH (ref 11.5–15.5)
WBC: 3.4 10*3/uL — ABNORMAL LOW (ref 4.0–10.5)

## 2010-09-22 LAB — PROTIME-INR: INR: 1.83 — ABNORMAL HIGH (ref 0.00–1.49)

## 2010-09-22 NOTE — H&P (Addendum)
NAMEKOI, [name] NO.:  [id number]  MEDICAL RECORD NO.:  [id number]  LOCATION:  2923                         FACILITY:  [facility]  PHYSICIAN:  Noralyn Pick. Eden Emms, MD, FACCDATE OF BIRTH:  04-13-1935  DATE OF ADMISSION:  09/20/2010 DATE OF DISCHARGE:                             HISTORY & PHYSICAL   PRIMARY CARDIOLOGIST:  Veverly Fells. Excell Seltzer, MD  NEPHROLOGIST:  Wilber Bihari. Caryn Section, MD  CHIEF COMPLAINT:  Chest pain, palpitations.  REASON FOR ADMISSION:  Chest pain and AFib with RVR.  HISTORY OF PRESENT ILLNESS:  [name] is a 75 year old female with a history of PAF, Graves disease, end-stage renal disease, on hemodialysis, and no history of formally diagnosed coronary artery disease, but a history of an NSTEMI in June 2011, at which time, no catheterization was performed secondary to renal disease prior to being on dialysis.  She was in dialysis for approximately one and half hours today and developed chest pain and palpitations, was noted AFib on the monitor with heart rate in the 140s directly.  Stroke from this episode demonstrates some ST depression in V3 through V5, and recurring AFib with RVR.  In the ER, her heart rate slowed down to the 70s on EKG, but is now back up to the 140s with seen.  She also complained of chest tightness during symptoms in dialysis.  Cardiac markers are pending.  PAST MEDICAL HISTORY: 1. Paroxysmal atrial fibrillation.     a.     History RVR in prior hospitalizations.     b.     History of bradycardia noted in chart, including previous      wandering atrial pacemaker and junctional bradycardia. 2. Hyperthyroidism with Graves disease, status post radioiodine     therapy. 3. Hypertension. 4. Hyperlipidemia. 5. End-stage renal disease, on dialysis. 6. Right pleural effusion. 7. NSTEMI in June 2011 with no cath at that time secondary to renal     function, with negative Myoview. 8. C. diff in June 2012. 9. Anemia. 10.Status  post attempted cholecystectomy resulting in cholecystostomy     tube, December 2010.  MEDICATIONS:  This will be clarified by pharmacy that includes. 1. Darbepoetin. 2. Metoprolol 25 b.i.d. 3. Zemplar. 4. Tylenol. 5. Calcium acetate. 6. Ranitidine 150 mg nightly. 7. Restasis 0.05% one drop both eyes b.i.d. 8. Coumadin.  ALLERGIES:  DILTIAZEM, IVP DYE, CODEINE, and an intolerance to iron sulfate.  Also allergic to VANCOMYCIN and RENA-VITE.  SOCIAL HISTORY:  [name] lives with her husband in [place]. She moved here from [place] 26 years ago.  She does not have any children.  She denies any tobacco or alcohol use.  FAMILY HISTORY:  Positive for MI in her mother at 16.  Positive for CVA in her father at 60.  She had a sibling who passed away at age 48 and had an MI in his late 62s.  REVIEW OF SYSTEMS:  All other systems reviewed and are otherwise negative.  LABORATORY DATA:  WBC 6, hemoglobin 13.5, hematocrit 40, platelet count 160.  Sodium 142, potassium 3.6, chloride 104, CO2 not done, glucose 103, BUN 24, creatinine 4.60.  INR is 2.39.  RADIOLOGIC STUDIES:  Chest x-ray shows stable  small right pleural effusion with cardiomegaly and pulmonary vascular congestion, both stable as well.  PHYSICAL EXAMINATION:  VITAL SIGNS:  Temperature 97.6, pulse 75, respirations 15, blood pressure 144/44, pulse ox 95% on room air. GENERAL:  This is a pleasant white female in no acute distress, laying flat in bed. HEENT:  Normocephalic and atraumatic.  Extraocular movements intact. Clear sclerae.  Nares are without discharge. NECK:  Supple. HEART:  Auscultation of the heart reveals tachycardia, regular rhythm without murmurs, rubs, or gallops. LUNGS:  Clear to auscultation bilaterally without wheezes, rales, or rhonchi. ABDOMEN:  Soft, nontender, and nondistended with positive bowel sounds. EXTREMITIES:  Warm and dry without edema. NEUROLOGIC:  She is alert and oriented x3.   Responds questions appropriately with normal affect.  ASSESSMENT AND PLAN:  The patient was seen and examined by Dr. Eden Emms and myself.  Ms. [name] is a 75 year old female with a history of paroxysmal atrial fibrillation, Graves disease, end-stage renal disease, on dialysis with prior non-ST segment elevation myocardial infarction in June 2011, no catheterization at that time secondary to renal function prior to being on dialysis.  She presented today with atrial fibrillation with rapid ventricular response with chest pain, enzymes are currently pending.  At this time, the patient will be admitted to the hospital and cardiac enzymes will be cycled.  She will be started on low-dose aspirin.  We feel that she likely needs a heart catheterization to rule out coronary artery disease, using antiarrhythmic therapy.  Dr. Eden Emms discussed with the case with Dr. Ladona Ridgel and she will be started on IV amiodarone 150 mg over 10 minutes, and then a decrease to her dose of 30 mg per hour.  We will notify Renal of her admission given her need for dialysis.  Her Coumadin will be held with a stat PT/INR both night and tomorrow morning with heparin to start per Pharmacy if it should fall less than 2.     Dayna Dunn, P.A.C.   ______________________________ Noralyn Pick Eden Emms, MD, [facility]    DD/MEDQ  D:  09/20/2010  T:  09/21/2010  Job:  [id number]  cc:   Veverly Fells. Excell Seltzer, MD Wilber Bihari. Caryn Section, M.D. Gary Fleet, M.D.  Electronically Signed by Charlton Haws MD [facility] on 09/22/2010 10:45:58 PM Electronically Signed by Ronie Spies  on 10/01/2010 02:16:19 PM

## 2010-09-23 LAB — PROTIME-INR
INR: 1.3 (ref 0.00–1.49)
Prothrombin Time: 16.4 seconds — ABNORMAL HIGH (ref 11.6–15.2)

## 2010-09-24 ENCOUNTER — Inpatient Hospital Stay (HOSPITAL_COMMUNITY)
Admission: RE | Admit: 2010-09-24 | Discharge: 2010-09-24 | Disposition: A | Discharge status: Home / Self Care | Payer: Medicaid Other | Source: Ambulatory Visit

## 2010-09-24 LAB — RENAL FUNCTION PANEL
CO2: 23 mEq/L (ref 19–32)
Calcium: 8.6 mg/dL (ref 8.4–10.5)
Creatinine, Ser: 6.05 mg/dL — ABNORMAL HIGH (ref 0.50–1.10)
GFR calc non Af Amer: 7 mL/min — ABNORMAL LOW (ref >60)

## 2010-09-24 LAB — CBC
MCH: 28 pg (ref 26.0–34.0)
MCV: 84.8 fL (ref 78.0–100.0)
Platelets: 206 10*3/uL (ref 150–400)
RBC: 4.54 MIL/uL (ref 3.87–5.11)

## 2010-09-28 ENCOUNTER — Encounter: Payer: Self-pay | Admitting: Internal Medicine

## 2010-09-28 ENCOUNTER — Ambulatory Visit (INDEPENDENT_AMBULATORY_CARE_PROVIDER_SITE_OTHER): Payer: Medicaid Other | Admitting: *Deleted

## 2010-09-28 ENCOUNTER — Ambulatory Visit (INDEPENDENT_AMBULATORY_CARE_PROVIDER_SITE_OTHER): Payer: Medicaid Other | Admitting: Internal Medicine

## 2010-09-28 VITALS — BP 170/63 | HR 63 | Ht 68.0 in | Wt 103.0 lb

## 2010-09-28 DIAGNOSIS — I129 Hypertensive chronic kidney disease with stage 1 through stage 4 chronic kidney disease, or unspecified chronic kidney disease: Secondary | ICD-10-CM

## 2010-09-28 DIAGNOSIS — I4891 Unspecified atrial fibrillation: Secondary | ICD-10-CM

## 2010-09-28 DIAGNOSIS — Z7901 Long term (current) use of anticoagulants: Secondary | ICD-10-CM

## 2010-09-28 NOTE — Assessment & Plan Note (Signed)
Her blood pressure remains elevated. She may require additional medical therapy for this as time goes on.

## 2010-09-28 NOTE — Progress Notes (Signed)
HPI [name] returns today for followup. She is a pleasant 75 year old woman with a history of hypertension, symptomatic tachybradycardia syndrome, end-stage renal disease now on hemodialysis, who was admitted to the hospital several weeks ago with atrial fibrillation and a rapid ventricular response. The patient after much discussion was started on amiodarone. On 100 mg twice a day her heart maintained sinus rhythm during nicely. She had no nausea or vomiting. Since discharge from the hospital, she has done well. No symptomatic atrial arrhythmias. Because of her underlying sinus node dysfunction, or blockers were discontinued and her heart rate is improved going from the high 40s to the low 60s at rest. She has had no syncope, no chest pain, and no shortness of breath. I should note that the patient underwent catheterization when she was in the hospital which demonstrated no obstructive coronary disease. Allergies  Allergen Reactions  . Cardizem (Diltiazem Hcl) Hives  . Codeine   . Iron     ?? Almost died.  . Venofer (Iron Sucrose (Iron Oxide Saccharated)) Itching     Current Outpatient Prescriptions  Medication Sig Dispense Refill  . amiodarone (PACERONE) 100 MG tablet Take 100 mg by mouth 2 (two) times daily.        . Calcium Acetate 667 MG TABS Take by mouth 3 (three) times daily before meals.        . cycloSPORINE (RESTASIS) 0.05 % ophthalmic emulsion Place 1 drop into both eyes 2 (two) times daily.        . metroNIDAZOLE (FLAGYL) 500 MG tablet Take 500 mg by mouth 3 (three) times daily.        . ranitidine (ZANTAC) 150 MG tablet Take 150 mg by mouth daily.        Marland Kitchen warfarin (COUMADIN) 5 MG tablet Take 1 tablet (5 mg total) by mouth as directed.  50 tablet  3     Past Medical History  Diagnosis Date  . ESRD (end stage renal disease)     HD on Tues, Thurs, Sat // Likely secondary to nephrosclerosis from HTN  . HTN (hypertension)   . HLD (hyperlipidemia)   . Cholelithiases    S/P biliary colic cholecystostomy, but not cholecystectomy secondary to omental adhesions  . Anxiety   . Anemia     BL Hgb 8-9. Likely AOCD in setting of ESRD with iron 48, ferritin  746 (03/2010)  . PAF (paroxysmal atrial fibrillation)     On chronic coumadin therapy // Followed by Dr. Excell Seltzer (cardiology)  . Non-ST elevation MI (NSTEMI)     Noninvasive evaluation with Myoview negative for ischemia.  // 2-D echo (05/2010) LVEF 65-70%. Grade 1 diastolic dysfunction.  Peak PA pressure 52.  Marland Kitchen Hyperthyroidism DX: 07/2010    Likely Grave's Disease. // On diagnosis, TSH < 0.08, free T4 1.94, free T3 5.3 . //  Thyroid US (07/17/2010) - nonspecific diffuse heterogenous thyroid gland. //  Thyrotropin receptor antibody neg (07/17/2010)    ROS:   All systems reviewed and negative except as noted in the HPI.   Past Surgical History  Procedure Date  . Other surgical history 02/2009    Cholecystostomy secondary to biliary colic - NO cholecystectomy secondary to omental adhesions - by Dr. Bertram Savin     Family History  Problem Relation Age of Onset  . Stroke    . Heart attack       History   Social History  . Marital Status: Married    Spouse Name: N/A  Number of Children: 0  . Years of Education: 10 th grad   Occupational History  .     Social History Main Topics  . Smoking status: Never Smoker   . Smokeless tobacco: Not on file  . Alcohol Use: No  . Drug Use: No  . Sexually Active: Not on file   Other Topics Concern  . Not on file   Social History Narrative  . No narrative on file     BP 170/63  Pulse 63  Ht 5\' 8"  (1.727 m)  Wt 103 lb (46.72 kg)  BMI 15.66 kg/m2  Physical Exam:  Well appearing NAD HEENT: Unremarkable Neck:  No JVD, no thyromegally Lymphatics:  No adenopathy Back:  No CVA tenderness Lungs:  Clear, no wheezes, rales, or rhonchi. HEART:  Regular rate rhythm, no murmurs, no rubs, no clicks Abd:  soft, positive bowel sounds, no organomegally,  no rebound, no guarding Ext:  2 plus pulses, no edema, no cyanosis, no clubbing Skin:  No rashes no nodules Neuro:  CN II through XII intact, motor grossly intact  EKG Normal sinus rhythm.  Assess/Plan:

## 2010-09-28 NOTE — Patient Instructions (Signed)
Your physician wants you to follow-up in: 6 months  You will receive a reminder letter in the mail two months in advance. If you don't receive a letter, please call our office to schedule the follow-up appointment.  Your physician recommends that you continue on your current medications as directed. Please refer to the Current Medication list given to you today.  

## 2010-09-28 NOTE — Assessment & Plan Note (Signed)
Her symptoms appear to be well controlled. She will continue amiodarone with a total dose of 200 mg daily. I will plan to keep her on this dose for 6-12 months. Long-term, I will plan to wean her down to a goal of 100 mg daily over the next year or 2. She currently is having no side effects from amiodarone.

## 2010-09-29 NOTE — Consult Note (Signed)
Lauren, Hines NO.:  [id number]  MEDICAL RECORD NO.:  [id number]           PATIENT TYPE:  I  LOCATION:  3705                         FACILITY:  [facility]  PHYSICIAN:  Marca Ancona, MD      DATE OF BIRTH:  1935-12-09  DATE OF CONSULTATION:  08/11/2010 DATE OF DISCHARGE:                                CONSULTATION   PRIMARY CARE PHYSICIAN:  Dr. Aundria Rud at the [facility].  PRIMARY CARDIOLOGIST:  Veverly Fells. Excell Seltzer, MD  CHIEF COMPLAINT:  Tachybrady syndrome.  HISTORY OF PRESENT ILLNESS:  [name] is a 75 year old female with a history of PAF.  She had onset of tachy palpitations at approximately 12:30 this morning.  She had no chest pain, shortness of breath or presyncope with it.  Palpitations made her very uncomfortable and she came to the hospital.  She was in AFib, RVR.  She has a history of PAF and has noted previously to have problems with wandering atrial pacemaker and junctional bradycardia.  Because of this, there is concern for tachybrady syndrome.  [name] has a history of chest pain.  She gets chest pain from PTU which was started recently.  It gives her chest pain as well as abdominal pain and some shortness of breath.  She used to get chest pain during her rapid AFib episodes but she states this improved since she started on Coumadin.  She has not had any chest pain this admission. She still feels her irregular and rapid heart rate.  Her heart rate is currently ranging from the 100s-130s.  PAST MEDICAL HISTORY: 1. PAF, recent admissions on June 07, 2010, Jul 16, 2010 and Aug 03, 2010. 2. Hyperthyroidism, diagnosed in May 2012 with PTU started on Jul 17, 2010. 3. History of non-ST-segment elevation MI in July of 2011, peak     troponin 17, no cath secondary to renal disease. 4. Chronic diastolic CHF with volume management by dialysis. 5. End-stage renal disease on hemodialysis. 6  Hypertension. 1.  Anticoagulation with Coumadin started in May 2012. 2. Anemia. 3. Family history of coronary artery disease.  SURGICAL HISTORY:  She is status post attempted cholecystectomy resulting in cholecystostomy tube.  She is also status post left upper arm AV fistula and PTA to the cephalic vein for a non-maturing fistula.  ALLERGIES:  She is allergic to CODEINE, IV IRON, and CARDIZEM.  Old notes lists allergies to multiple BETA-BLOCKERS but it is believed at that those are intolerances.  CURRENT MEDICATIONS: 1. Atenolol 25 mg daily. 2. PhosLo 667 mg t.i.d. 3. Restasis b.i.d. 4. Aranesp weekly. 5. Pepcid 20 mg a day. 6. Lopressor 5 mg p.r.n. for heart rate sustained greater than 120. 7. Nepro b.i.d. 8. Zemplar at dialysis. 9. PTU 50 mg t.i.d. prior to admission, now 50 mg at bedtime. 10.Coumadin.  SOCIAL HISTORY:  She lives in [place] with her husband.  She is a housewife.  She has no significant history of alcohol, tobacco, or drug abuse.  FAMILY HISTORY:  Her mother died at 50 with an MI and her father died at 61  with a stroke.  She also has 1 brother that died at 7 with an MI.  REVIEW OF SYSTEMS:  She has not had fevers, chills, or sweats.  The chest pain is a burning chest pain that she gets with the medication and goes into her abdomen.  She has some chronic dyspnea on exertion that is not changed recently.  She has occasional arthralgias.  She has possible reflux symptoms but denies melena.  Full 14-point review of systems is otherwise negative except as stated in the HPI.  PHYSICAL EXAMINATION:  VITAL SIGNS:  Temperature is 98.3, blood pressure 165/98, pulse 109, respiratory rate 16, O2 saturation 100% on 2 liters. GENERAL:  She is a slender elderly female in no acute distress at rest. HEENT:  Normal for age. NECK:  There is no lymphadenopathy, thyromegaly, or bruit noted and JVP is 8-9 cm. CV:  Her heart is rapid and irregular in rate and rhythm with an S1, S2 but no  significant murmur, rub, or gallop is noted.  Distal pulses are intact in all 4 extremities. SKIN:  No rashes or lesions are noted. LUNGS:  Clear to auscultation bilaterally. ABDOMEN:  Soft and nontender with active bowel sounds. EXTREMITIES:  There is no cyanosis, clubbing, or edema noted. MUSCULOSKELETAL:  There is no joint deformity or effusions and no spine or CVA tenderness. NEURO:  She is alert and oriented.  Cranial nerves II-XII grossly intact.  EKG is AFib, RVR rate 130 with diffuse ST changes.  LABORATORY VALUES:  Hemoglobin 11.6, hematocrit 37, WBCs 8.7, platelets 253,000.  Sodium 139, potassium 3.5, chloride 98, CO2 30, BUN 35, creatinine 3.64, glucose 129, CK-MB and troponin I negative x1, INR 2.17.  IMPRESSION:  [name] was seen today by Dr. Clifton James, the patient evaluated and the data reviewed.  She has end-stage renal disease and Graves disease with hyperthyroidism as well as paroxysmal atrial fibrillation.  She presented with hypertension and atrial fibrillation, rapid ventricular response.  She felt palpitations but no chest pain. Her paroxysmal atrial fibrillation is driven in part by hyperthyroidism. She has an allergy to diltiazem with rash and atenolol is not optimal in the setting of renal disease, so we will change her beta-blocker to Lopressor 25 mg p.o. q.6 h. and titrate up as needed.  She needs definitive treatment of her hyperthyroidism and to that end we will consult Dr. Lucianne Muss for consideration of radioactive iodine.  She should be continued on Coumadin.  Some bradycardia has been noted in the past and she will be monitored closely.  If her heart rate and rhythm are stable at discharge, we will get a 3-week event monitor.     Theodore Demark, PA-C   ______________________________ Marca Ancona, MD    RB/MEDQ  D:  08/11/2010  T:  08/12/2010  Job:  [id number]  Electronically Signed by Theodore Demark PA-C on 09/09/2010 10:55:48  AM Electronically Signed by Marca Ancona MD on 09/29/2010 01:34:22 PM

## 2010-09-29 NOTE — Consult Note (Signed)
NAMEKRISTON, [name] NO.:  [id number]  MEDICAL RECORD NO.:  [id number]           PATIENT TYPE:  I  LOCATION:  3705                         FACILITY:  [facility]  PHYSICIAN:  Doylene Canning. Ladona Ridgel, MD    DATE OF BIRTH:  03-31-1935  DATE OF CONSULTATION:  08/12/2010 DATE OF DISCHARGE:                                CONSULTATION   Consultation is requested by Dr. Tonny Bollman.  INDICATION FOR CONSULTATION:  Evaluation of atrial fibrillation with rapid ventricular response.  CHIEF COMPLAINT:  Palpitations.  HISTORY OF PRESENT ILLNESS:  The patient is a 75 year old woman with lots of medical problems including end-stage renal disease on hemodialysis, coronary artery disease status post stenting, she has preserved LV function.  The patient has had a history of atrial fibrillation and rapid ventricular response in the past.  She was in her usual state of health when she was noted to have palpitations and was admitted with atrial fibrillation and rapid ventricular response.  She was found to have a very low TSH and diagnosed with hyperthyroidism. She denies syncope.  She does have palpitations.  She has not had trouble with dialysis with regard to chest pain or heart failure symptoms.  PAST MEDICAL HISTORY:  Notable for hypertension.  She has a history of anemia.  She has a history of hyperthyroidism.  She has a history of weight loss, thought secondary to her hyperthyroidism.  She has history of atrial fibrillation as previously mentioned.  FAMILY HISTORY:  Negative for premature coronary disease.  SOCIAL HISTORY:  Notable for no tobacco or ethanol abuse.  REVIEW OF SYSTEMS:  Notable for palpitations, weight loss, and fatigue and weakness.  Otherwise all systems are negative except as noted above.  PHYSICAL EXAMINATION:  GENERAL:  She is a pleasant 75 year old woman who is in no acute distress at the time of my exam, her pulse was 60.  Her blood pressure was  130/75, respirations were 18 and temperature was 98. HEENT:  Normocephalic and atraumatic.  Pupils are equal and round. Oropharynx moist.  Sclerae anicteric. NECK:  Revealed no jugular distention. LUNGS:  Clear bilaterally to auscultation.  No wheezes, rales or rhonchi are present.  No increased work of breathing. CARDIAC:  Revealed a regular rate and rhythm.  Normal S1 and S2.  The PMI was not enlarged, laterally displaced. ABDOMEN:  Soft, nontender.  There is no organomegaly. EXTREMITIES:  Demonstrated a well-healed left arm fistula.  There is no peripheral edema.  No cyanosis or clubbing. NEUROLOGIC:  Alert and oriented x3.  Cranial nerves intact.  Strength is 5/5 and symmetric.  EKG demonstrates atrial fibrillation with a rapid ventricular response. Subsequent EKG demonstrates sinus bradycardia.  IMPRESSION: 1. Paroxysmal atrial fibrillation. 2. End-stage renal disease on hemodialysis. 3. Hypertension. 4. Chronic anemia. 5. Coronary disease status post non-ST-elevation myocardial     infarction.  RECOMMENDATIONS:  The patient is in difficult situation.  She is not a great candidate for amiodarone therapy and has refused this in the past. At this point, I would recommend treating her hyperthyroidism.  Would continue her beta-blocker and up titrate it as she tolerates and  we will replete her electrolytes.  Long-term options for rhythm control is very difficult for this patient.  Very low dose digoxin could be considered.     Doylene Canning. Ladona Ridgel, MD     GWT/MEDQ  D:  08/12/2010  T:  08/13/2010  Job:  [id number]  Electronically Signed by Lewayne Bunting MD on 09/29/2010 08:31:54 AM

## 2010-09-29 NOTE — Cardiovascular Report (Signed)
  [name], Hines NO.:  [id number]  MEDICAL RECORD NO.:  [id number]  LOCATION:  2923                         FACILITY:  [facility]  PHYSICIAN:  Zeki Bedrosian M. [place], M.D.  DATE OF BIRTH:  [birth year]  DATE OF PROCEDURE: DATE OF DISCHARGE:                           CARDIAC CATHETERIZATION   INDICATIONS:  A 75 year old female presents with non-ST elevation myocardial infarction in the setting of atrial fibrillation with a rapid ventricular response.  PROCEDURES:  Left heart catheterization, coronary and left ventricular angiography.  ACCESS:  Via the right femoral artery using standard Seldinger technique.  EQUIPMENT:  A 5-French 4-cm right and left Judkins catheter, 5-French pigtail catheter, 5-French arterial sheath.  MEDICATIONS:  Local anesthesia 1% Xylocaine, contrast 60 mL of Omnipaque.  HEMODYNAMIC DATA:  Aortic pressure is 166/48 with a mean of 88 mmHg. Left ventricular pressures 163 with EDP of 14 mmHg.  ANGIOGRAPHIC DATA:  The left main coronary artery is normal.  Left anterior descending artery has scattered 20% irregularities.  Left circumflex coronary has 20% irregularities in the proximal vessel.  The right coronary artery arises and distributes normally.  It has mild irregularities up to 10-20%.  Left ventricular angiography performed in the RAO view demonstrates normal left ventricular size and contractility with normal systolic function.  Ejection fraction is estimated at 60%.  FINAL INTERPRETATION: 1. Minimal nonobstructive atherosclerotic coronary artery disease. 2. Normal left ventricular function.  PLAN:  We would recommend continued medical therapy.          ______________________________ [name] M. [place], M.D.     PMJ/MEDQ  D:  09/21/2010  T:  09/22/2010  Job:  [id number]  cc:   Duke Salvia, MD, [facility] C. Ulyess Mort, M.D. Dyke Maes, M.D. Reather Littler, M.D.  Electronically Signed by Margareth Kanner [place] M.D. on  09/29/2010 11:49:39 AM

## 2010-09-29 NOTE — Consult Note (Signed)
NAMELEANNE, SISLER NO.:  [id number]  MEDICAL RECORD NO.:  [id number]  LOCATION:                                 FACILITY:  PHYSICIAN:  Doylene Canning. Ladona Ridgel, MD    DATE OF BIRTH:  1935-10-19  DATE OF CONSULTATION:  09/21/2010 DATE OF DISCHARGE:                                CONSULTATION   REQUESTING PHYSICIAN:  Noralyn Pick. Eden Emms, MD, [facility]  INDICATION FOR CONSULTATION:  Evaluation of atrial fibrillation with a rapid ventricular response in the setting of end-stage renal disease, on dialysis, and likely coronary disease with recurrent non-ST-elevation myocardial infarctions associated with her atrial fibrillation.  HISTORY OF PRESENT ILLNESS:  The patient is a 75 year old woman from [place] who has had a long-standing history of hypertension, renal insufficiency, and coronary disease.  She does have a history of Graves disease and is status post iodine therapy.  The patient has had recurrent admissions for atrial fibrillation with a rapid ventricular response.  When she comes in, her cardiac markers are uniformly elevated.  She has not undergone catheterization because of her concerns about worsening her kidney dysfunction.  She has unfortunately progressed to end-stage renal disease and now is on dialysis 3 days a week.  During dialysis, she has problems with her heart getting out of rhythm and was admitted to the hospital yesterday with atrial fibrillation and a rapid ventricular response, heart rate is in the 140 range.  She was treated with beta-blockers as well as intravenous amiodarone, has now returned back to sinus rhythm.  Of note, the patient has refused antiarrhythmic drug therapy in the past.  She does not have syncope.  PAST MEDICAL HISTORY:  As noted in the HPI.  She also has chronic pleural effusions.  She has a history of cholecystectomy.  She has a history of C. diff diarrhea and now chronic diarrhea.  She has a history of anemia.  She has a  history of hypertension.  SOCIAL HISTORY:  The patient is married, lives in [place].  She denies tobacco or ethanol abuse.  FAMILY HISTORY:  Notable for mother dying of an MI in her 9s.  Father dying of a stroke in his 52s.  She has multiple siblings with coronary disease.  REVIEW OF SYSTEMS:  All systems were reviewed and negative except as noted in the HPI.  She still has liquidy stools and frequent stools up to 6 bowel movements a day.  PHYSICAL EXAMINATION:  GENERAL:  She is a pleasant 75 year old woman, in no acute distress. VITAL SIGNS:  The blood pressure was 149/50, the pulse was 58 and regular, the respirations were 18, temperature is 98. HEENT:  Normocephalic and atraumatic.  Pupils equal and round. Oropharynx moist.  Sclerae anicteric. NECK:  No jugular venous distention.  There is no thyromegaly.  Trachea was midline.  The carotids were 2+ and symmetric. LUNGS:  Clear bilaterally to auscultation.  No wheezes, rales, or rhonchi are present. CARDIOVASCULAR:  Regular bradycardia with normal S1 and S2.  The PMI was not enlarged or laterally displaced. ABDOMEN:  Soft, nontender.  There is no organomegaly.  Bowel sounds are present.  No rebound or guarding. EXTREMITIES:  Demonstrated no  edema.  IMAGING:  Chest x-ray demonstrates right-sided pleural effusion, cardiomegaly, and increased pulmonary vascularity.  LABORATORY DATA:  Potassium of 3.6, hemoglobin was 13.  Her INR was 2.3, repeat was 2.0.  IMPRESSION: 1. Symptomatic tachy-brady syndrome. 2. Paroxysmal atrial fibrillation. 3. End-stage renal disease, on hemodialysis. 4. Probable coronary disease with multiple non-ST-elevation myocardial     infarctions in the setting of atrial fibrillation with a rapid     ventricular response. 5. Chronic Coumadin therapy.  DISCUSSION:  The patient is currently willing to proceed with left heart catheterization.  Her INR is 1.7 today.  I would recommend proceeding with  this at this time to define her coronary anatomy.  The only one antiarrhythmic drug for this patient now would be amiodarone.  She is now in sinus rhythm.  Unfortunately, because of bradycardia, very judicious doses of her amiodarone would be required and we will start her at 100 mg twice a day with the idea of a very slow and prolonged loading of this medication.  Because of her bradycardia, we will hold her beta-blocker.  The patient may ultimately end up needing a pacemaker, but because she is not a good candidate for pacemaker with her renal failure, end-stage renal disease, my hope would be that we could avoid requiring permanent pacemaker for this patient.  We will plan on following her with you.     Doylene Canning. Ladona Ridgel, MD     GWT/MEDQ  D:  09/21/2010  T:  09/21/2010  Job:  [id number]  Electronically Signed by Lewayne Bunting MD on 09/29/2010 08:31:57 AM

## 2010-09-30 NOTE — Consult Note (Signed)
[name], LEIN NO.:  [id number]  MEDICAL RECORD NO.:  [id number]           PATIENT TYPE:  I  LOCATION:  3705                         FACILITY:  [facility]  PHYSICIAN:  Reather Littler, M.D.       DATE OF BIRTH:  [birth year]  DATE OF CONSULTATION: DATE OF DISCHARGE:                                CONSULTATION   REASON FOR CONSULTATION:  Hyperthyroidism.  HISTORY:  This is a 75 year old [place] lady who has been diagnosed as having hyperthyroidism in March of this year.  The patient had presented with atrial fibrillation with rapid ventricular response and has had 3 admissions for this.  On the admission in late March, the patient was noted to have a low TSH level and the patient was told to follow up with her primary care physician on discharge.  The patient was again admitted in May twice and at that time, she was told to have Graves disease.  She was scheduled to be seen by myself in the office on __________  but was not seen because of another hospital admission.  On questioning the patient, she says that she has not had any heat intolerance or sweating.  She does not complain of any shakiness or nervousness.  She has lost a little weight, but she thinks that this is because of her not eating related to her taking certain medications or problems with her gallbladder causing abdominal pain and anorexia.  She does have problems with palpitations with recurrent atrial fibrillation and tachycardia.  The patient's evaluation has been including a TSH which in March was 0.045; her free T4 on Jul 17, 2010, was 1.94 which is high; and free T3 was increased at 5.2, normal range being up to 4.2.  TSH done on Aug 03, 2010, was also low at 0.01.  The patient has had an I-131 uptake which showed an uptake of 25.7 and showed uniform uptake.  She did also have an ultrasound of thyroid which showed nonspecific diffuse heterogeneous thyroid gland.  Notably, the patient has not  had a CT scan or other tests including any kind of iodine-containing dye since April 14, 2010, when she had a CT of her chest.  The patient has apparently been given PTU and Tapazole and she claims that she had side effects from both of these.  She claims that she gets abdominal pain, chest pain, shortness of breath, and nausea with these medications and is very reluctant to take these again.  PAST MEDICAL HISTORY: 1. She has had a myocardial infarction. 2. History of diastolic heart failure. 3. History of anemia. 4. She had an attempted cholecystectomy in December 2010, which was     not successful.  ALLERGIES:  She is allergic to CODEINE, INFED, and VENOFER.  PERSONAL HISTORY:  The patient is married, has no children.  She is not a smoker and does not use any alcohol or herbal supplements.  FAMILY HISTORY:  Positive for MI, CVA in several family members.  MEDICATIONS:  The patient currently is on cyclosporin, Pepcid, metoprolol, Zemplar, she is taking PTU 50 mg at bedtime, Coumadin, calcium, and p.r.n.  medications.  REVIEW OF SYSTEMS:  The patient has had end-stage renal disease requiring dialysis.  She has had no significant hypertension recently, has had some problems with anorexia and nausea which she thinks is from her gallbladder disease.  She does not complain of any shortness of breath at this time.  No history of diabetes.  No history of numbness in her feet.  Does not complain of any weakness.  PHYSICAL EXAMINATION:  GENERAL:  The patient is thinly built.  She is somewhat asthenic looking.  Her weight is not guarded. VITAL SIGNS:  Currently, her heart rate is 57 and regular, blood pressure 146/74. HEENT:  Eyes, she does not have any prominence of her eyes.  No lid lag or stare present.  No proptosis.  Oral mucosa is normal. NECK:  She has no lymphadenopathy.  Her thyroid is enlarged and is globular and about twice normal on the right side and just palpable  on the left side on swallowing.  No bruits heard over the thyroid, no carotid bruits heard. HEART:  Normal S1 and S2.  She does have a loud long systolic murmur over the base of the heart in the mitral area. LUNGS:  Clear. ABDOMEN:  Slight guarding present, but there is no mass or tenderness. EXTREMITIES:  There is no edema, no skin lesions.  Pedal pulses are normal.  Ankle reflexes are very brisk.  There is no tremor present.  No focal weakness evident.  ASSESSMENT:  This patient does have significant problems with tachycardia precipitated by underlying cardiac disease as also complicated by her hyperthyroidism.  Since her I-131 uptake is not usually high and she does show some heterogeneous findings on her ultrasound, it is likely that she has more of a multinodular goiter type of picture rather than Grave disease causing hyperthyroidism and again typically, she is not very symptomatic with this.  Incidentally, her thyrotropin receptor antibodies were negative also.  Plan would be to give her I-131 treatment for definitive treatment of her hyperthyroidism since she is reluctant to take antithyroid drugs and apparently has had some intolerance to these.  If she does get any flare-up of her hyperthyroidism, we will have to have her take some antithyroid drugs at that time.  Her beta-blockers will be adjusted by cardiologist.  Plan would be to follow up in 2 weeks after discharge and have the I-131 done right on the day of discharge.     Reather Littler, M.D.     AK/MEDQ  D:  08/12/2010  T:  08/12/2010  Job:  [id number]  cc:   C. Ulyess Mort, M.D. Veverly Fells. Excell Seltzer, MD  Electronically Signed by Reather Littler M.D. on 09/30/2010 02:51:17 PM

## 2010-10-05 NOTE — Discharge Summary (Signed)
**Note [name] via Obfuscation** [name], [name] NO.:  [id number]  MEDICAL RECORD NO.:  [id number]  LOCATION:  3705                         FACILITY:  [facility]  PHYSICIAN:  Mariea Stable, MD   DATE OF BIRTH:  [birth year]  DATE OF ADMISSION:  08/11/2010 DATE OF DISCHARGE:  08/16/2010                              DISCHARGE SUMMARY   DISCHARGE DIAGNOSES: 1. Atrial fibrillation with rapid ventricular response. 2. Hypothyroidism. 3. End-stage renal disease, on hemodialysis. 4. Hypertension. 5. History of non-ST segment elevation myocardial infarction.  DISCHARGE MEDICATIONS: 1. Tylenol 325 mg tablets, take 2 tablets by mouth every 6 hours as     needed. 2. Metoprolol 25 mg tablets, take 1 tablet by mouth 3 times a day, you     may take 1 tablet as needed for palpitations. 3. Zemplar 5 mcg/mL injection 5 mcg IV with dialysis. 4. Calcium acetate 667 mg 1 capsule by mouth 3 times a day between     meals. 5. Ranitidine 150 mg tablets, take 1 tablet by mouth daily at bedtime. 6. Restasis 1 drop in both eyes twice daily. 7. Coumadin 5 mg tablets, take 1 tablet by mouth every morning.  You should discontinue propylthiouracil.  DISPOSITION AND FOLLOWUP:  The patient will be discharged with a plan to go to Nuclear Medicine where she has been ordered by Dr. Lucianne Muss to receive radioactive iodine ablation of the thyroid.  After this procedure, the patient will be discharged home.  The patient will return to the Radiology department on Wednesday the day following her discharge and see Josefa Half where they will use a Clarisa Kindred counter to measure her radiation.  If the patient's radiation level is greater than 2, she will be set up by Josefa Half to receive dialysis here in the hospital on Thursday, her regular dialysis day.  If the patient's radiation level is less than 2, Josefa Half will send this information to her dialysis center, and she will be able to receive dialysis on her  regular schedule.  The patient is scheduled to follow up with the Coumadin Clinic at [facility] on Monday at 9:30 a.m. on August 22, 2010.  The patient will follow up with Dr. Scot Dock at the Outpatient Clinic at [facility] on August 22, 2010.  This is to ensure that she is having no further recurrence of her palpitations or AFib with RVR.  The patient's blood pressure should be checked at this visit as it was elevated during her hospitalization, and she may need titration of her medications that we would like to wait to do this until after her radioactive iodine ablation.  The patient is scheduled to follow up with Dr. Lucianne Muss on August 30, 2010, at 2:45 p.m.  At this appointment, the patient should have continued management of her hyperthyroidism, repeat thyroid labs should be followed to determine whether or not the patient will need treatment for hypothyroidism after her ablation.  The patient will follow up with [facility], Dr. Excell Seltzer, on September 05, 2010, at 4:15 in the afternoon.  The Outpatient Clinic requests further assistance with the patient's management of her AFib as well as her blood pressure at that time.  PROCEDURES PERFORMED:  None.  CONSULTATIONS:  EP and Cardiology.  BRIEF ADMITTING HISTORY AND PHYSICAL:  This is a 75 year old female with past medical history of end-stage renal disease, on hemodialysis, as well as paroxysmal atrial fibrillation on chronic Coumadin therapy who presented for evaluation of palpitations that began on the evening prior to her admission.  This had been intermittent.  The patient's palpitations are associated with shortness of breath and mild diffuse chest pain.  Palpitations also were described as feeling that someone puncturing the chest.  They were relieved after receiving oxygen in the ED.  The patient states that she has been having some chest pain intermittently since starting her PTU during her last hospitalization on Aug 04, 2010.  Palpitation were not associated with presyncopal or syncopal episodes, fevers, chills, nausea, vomiting, dizziness, or weakness.  During the patient's prior hospital course, she was admitted with the same presentation, thought to be secondary to her being noncompliant to her methimazole therapy.  Secondary to the intolerance, at that time, she was changed to PTU, which she states caused similar side effects.  Despite the patient's symptoms, she continued to take the medication.  Also during her last admission, the patient was noted to have bradycardia to the 50s.  So, she was not discharged on a beta- blocker.  The patient was recommended to follow up with the outpatient Cardiology for Holter monitor testing.  OUTPATIENT MEDICATIONS:  Restasis, calcium acetate, and Nitrostat, PTU, Zantac, and Coumadin.  ALLERGIES:  CARDIZEM, CODEINE, IRON, and VENOFER.  PAST MEDICAL HISTORY: 1. End-stage renal disease. 2. Hypertension. 3. Hyperlipidemia. 4. Cholelithiasis. 5. Anxiety. 6. Anemia. 7. Paroxysmal atrial fibrillation. 8. History of NSTEMI. 9. Hyperthyroidism.  PHYSICAL EXAMINATION:  VITAL SIGNS:  On admission, temperature 98, pulse 120, blood pressure 141/81, respiratory rate 16, satting at 98% on room air. GENERAL:  Alert, cooperative to examination. HEAD:  Normocephalic, atraumatic. EYES:  PERRL, EOMI.  No signs of icterus. LUNGS:  Bilaterally clear to auscultation without crackles or wheezes. HEART:  Irregularly irregular.  Normal rate.  Normal S1 and S2 without murmurs or gallops. ABDOMEN:  Bowel sounds normoactive, soft, nondistended, and nontender. EXTREMITIES:  No pretibial edema. NEUROLOGIC:  Cranial nerves II-XII grossly intact.  LABORATORIES ON ADMISSION:  White blood cell count 8.7, hemoglobin 11.6, hematocrit 37.0, platelet counts 253. Basic metabolic panel:  Sodium 139, potassium 3.5, chloride 98, bicarb 30, BUN of 35, creatinine 4.64, glucose of  129. Cardiac enzymes were negative x1 on admission.  HOSPITAL COURSE BY PROBLEM: 1. Atrial fibrillation with rapid ventricular response:  The patient     presented with palpitations and was found to be in AFib with RVR.     The third time that she was admitted for     similar complaints.  During her last admission, she was started on     PTU due to prior intolerance to methimazole.  The patient initially     received metoprolol for her elevated heart rate and did respond to     this, cardiac enzymes remained negative x3, and there were no ST     changes concerning for ischemia on her EKG or tele.  Cardiology was     consulted and recommended that we initiate treatment with     metoprolol 25 mg p.o. q.6 h.  By hospital day #3, the patient     spontaneously converted to sinus bradycardia.  The patient was     continued on her metoprolol and blood pressures remained stable to  elevated.  We were unable to further titrate her metoprolol dose     because of her bradycardia.  The patient has a history of atrial     fibrillation in the past, but it was felt that her recent multiple     episodes of RVR are likely secondary to uncontrolled     hyperthyroidism; as such Endocrinology was consulted.  I did inform     the patient that she could take an extra dose of metoprolol for     palpitations as recommended by Dr. Excell Seltzer, her primary     cardiologist.  At the time of discharge, the patient was in     improved and stable condition and had no further recurrence of her     AFib with RVR.  The patient has been scheduled followup with the     Coumadin Clinic at [facility] for further management of her     warfarin levels.  2. Hypothyroidism:  The patient was recently diagnosed with     hyperthyroidism back in May 2012 and TSH at that time was found to     be less than 0.01.  Radioactive iodine uptake scan was performed on     Aug 05, 2010, which demonstrated homogeneous uptake of the  thyroid     gland.  Because the patient was intolerant to both methimazole as     well as PTU, Endocrinology was consulted and Dr. Lucianne Muss was kind     enough to see the patient in the hospital.  Dr. Lucianne Muss did not feel     that this was secondary to Graves disease because of decreased     diffuse uptake.  He did recommend radioactive iodine ablation,     which he ordered for the day of her discharge.  The patient was     discharged with the plan for her to go to Radiology for this     procedure to be performed.  The patient will follow up with Dr.     Lucianne Muss in 2 weeks for further management of her hyperthyroidism.  3. End-stage renal disease:  Because the patient is taking a     radioactive substance, she is unable to perform dialysis at her     regular dialysis center unless her radioactivity level was less     than 2 per [facility].  As such the patient will return on the     day after her discharge to be measured, and Josefa Half will     manage her further care at that point whether that means her being     said to her home dialysis if it is less than 2 as mentioned above     or returning to our hospital for dialysis if it is greater than 2.  4. Hypertension:  This was stable at the time of her admission;     however, on the day prior to her discharge, blood pressure is in     the systolic range, increased to around 160 to 190.  She was given     hydralazine and blood pressures improved back to reasonable limits     in the 120s.  The patient was not discharged on any new blood     pressure medications because of numerous intolerances to     antihypertensives.  We wanted to determine what her blood pressure     would be after radioactive iodine ablation, so the patient will  need followup in outpatient clinic for further titration of her     antihypertensives as needed post procedure.  DISCHARGE VITAL SIGNS:  Blood pressure 151/63, temperature 98.2, pulse 54, respirations 20.   O2 Sat 98% on room air.   ______________________________ Sinda Du, MD   ______________________________ Mariea Stable, MD  Discharge summary reviewed and signed for Drs. Bowen and Onalee Hua.   ______________________________ Rocco Serene, MD  BB/MEDQ  D:  08/16/2010  T:  08/17/2010  Job:  [id number]  cc:   Rosilyn Mings [facility]  Electronically Signed by Doneen Poisson  on 10/05/2010 05:41:05 PM

## 2010-10-06 LAB — PROTIME-INR

## 2010-10-06 NOTE — Discharge Summary (Signed)
NAMELAVAEH, BAU NO.:  [id number]  MEDICAL RECORD NO.:  [id number]  LOCATION:  2006                         FACILITY:  [facility]  PHYSICIAN:  Noralyn Pick. Eden Emms, MD, FACCDATE OF BIRTH:  [birth year]  DATE OF ADMISSION:  09/20/2010 DATE OF DISCHARGE:  09/24/2010                              DISCHARGE SUMMARY   PRIMARY CARDIOLOGIST:  Veverly Fells. Excell Seltzer, MD  NEPHROLOGIST:  Wilber Bihari. Caryn Section, MD  PRIMARY CARE PHYSICIAN:  C. Ulyess Mort, MD  CONSULTATIONS: 1. Dr. Briant Cedar, Nephrology. 2. Dr. Lewayne Bunting, Electrophysiology.  PROCEDURES PERFORMED DURING HOSPITALIZATION: 1. Cardiac catheterization.     a.     Completed September 21, 2010, per Dr. Leilanni Halvorson [place] with results      demonstrating minimal nonobstructive atherosclerotic coronary      artery disease with normal LV function, continued medical therapy      is recommended.  FINAL DISCHARGE DIAGNOSES: 1. Atrial fibrillation with rapid ventricular response. 2. History of bradycardia with previous wandering atrial pacemaker and     junctional bradycardia. 3. Hyperthyroidism with Graves disease status post radioiodine     therapy. 4. Hypertension. 5. Hyperlipidemia. 6. End-stage renal disease on dialysis. 7. Right pleural effusion. 8. Non-ST elevation myocardial infarction in June 2011, with no cath     at bedtime secondary to renal function with negative Myoview. 9. Positive Clostridium difficile in 2012. 10.Anemia. 11.Status post attempted cholecystectomy resulting in cholecystostomy     tube December 2010.  HOSPITAL COURSE:  The patient is a 75 year old female patient with above- mentioned history who was admitted with chest pain and AFib with RVR. The patient had been on dialysis for approximately 1-1/12 half hours and on day of admission developed chest pain with palpitations.  She was noted to be in atrial fibrillation with a heart rate in the 140s.  The patient's EKG revealed some ST depression in V3  through V5 with recurring AFib and RVR.  In the ER, the heart rate decreased to 70 beats per minute but is now back in the 140s.  She was complaining of chest tightness during dialysis in the past.  Cardiac markers were completed and she was admitted to rule out myocardial infarction.  Cardiac enzymes were found to be negative x3.  The patient was sent for cardiac catheterization to evaluate further in the setting of atrial fibrillation with RVR.  The patient was found to have nonobstructive coronary artery disease with normal LV function.  At that point, the patient had a consultation with Dr. Lewayne Bunting to evaluate for her recurrent atrial fibrillation with RVR.  The patient was also seen by Dr. Briant Cedar and the Nephrology Team to continue her dialysis throughout hospitalization.  Dr. Ladona Ridgel recommended stopping metoprolol and she was started on amiodarone.  The patient remained in normal sinus rhythm after institution of amiodarone.  Unfortunately, she had some mild bradycardia as well.  Dr. Ladona Ridgel felt that it would be best to start her on 100 mg b.i.d. with the idea of a very slow and prolonged loading process of this medication.  Because of her bradycardia, the beta blocker was held.  The patient was in his discussion on his consult note.  It  was found that the patient may ultimately end up needing a pacemaker but she is not a good candidate for pacemaker with her renal failure and end-stage disease.  It was his hope that we could avoid placing a pacemaker with the patient.  The patient tolerated the amiodarone throughout hospitalization and continued dialysis.  She remained in normal sinus rhythm on day of discharge.  The patient did not have any ventricular ectopy as well.  On day of discharge, the patient's EKG revealed normal sinus rhythm with a rate of 64 beats per minute.  The patient was seen and examined by Dr. Patty Sermons on day of discharge and was found to be stable.   The patient will complete her hemodialysis which is in process during this discharge summary and will be discharged thereafter.  DISCHARGE LABORATORIES:  Sodium 136, potassium 3.2, chloride 98, CO2 23, glucose 83, BUN 42, creatinine 6.05.  The patient's albumin was 2.7, calcium 8.6, phosphorus 4.1.  Hemoglobin 12.7, hematocrit 38.5, white blood cells 4.9, platelets 206.  PT 19.3, INR 1.60.  C.  Diff checked on September 21, 2010, was positive.  Total cholesterol 176, triglycerides 96, HDL 51, LDL 106.  Cardiac enzymes were negative x3 and less than 0.30 respectively.  TSH was found to be 0.011.  Chest x-ray on discharge which was dated September 20, 2010, revealed stable small right pleural effusion and stable cardiomegaly with pulmonary vascular congestion.  VITAL SIGNS ON DISCHARGE:  Blood pressure 168/66, pulse 58, respirations 18, temperature 98.0, O2 sat 97% on room air, weight was 45.8 kg.  DISCHARGE MEDICATIONS: 1. Acetaminophen 325 mg 2 tablets q. 6 h. P.r.n. 2. Amiodarone 100 mg 1 tablet by mouth b.i.d. (new prescription     provided). 3. Aspirin 81 mg enteric coated daily. 4. Metronidazole 500 mg 1 tablet by mouth three times a day for 7 days     (new prescription provided). 5. Calcium acetate 667 mg b.i.d. with meals. 6. Continuing dialysis Tuesdays, Thursdays, Saturdays. 7. Ranitidine 150 mg tablet daily at bedtime. 8. Restasis ophthalmic 0.05% 1 drop each eye b.i.d. 9. Warfarin 5 mg by mouth every evening to be dosed per PT/INR at the     [facility]. 10.The patient is to stop taking metoprolol 25 mg by mouth b.i.d.  ALLERGIES:  CODEINE, FERROUS SULFATE, DILTIAZEM, VANCOMYCIN, IRON SUCROSE and RENAL VITAMIN.  FOLLOWUP PLANS AND APPOINTMENTS: 1. The patient is to follow up in the [facility] next week at     [facility] for continued PT/INR monitoring. 2. The patient is to follow up with Dr. Excell Seltzer post hospitalization.     Our office will call to  make that appointment. 3. The patient will follow with Dr. Ladona Ridgel, EP specialist for     continued evaluation of atrial fibrillation.  Our office will call     to make that appointment. 4. The patient will continue dialysis through Dr. Caryn Section as described on     Tuesdays, Thursdays, Saturdays. 5. She will follow up with Dr. Aundria Rud, her primary care physician for     continued medical management. 6. The patient has been advised that she is to stop metoprolol and     begin amiodarone 100 mg b.i.d. with prescription provided. 7. She has been advised to bring all medications to follow-up     appointment. 8. The patient is to continue renal diet.  Time spent with the patient to include physician time 40 minutes.     Bettey Mare. Lyman Bishop, NP  ______________________________ Noralyn Pick Eden Emms, MD, [facility]    KML/MEDQ  D:  09/24/2010  T:  09/25/2010  Job:  [id number]  cc:   Veverly Fells. Excell Seltzer, MD Wilber Bihari. Caryn Section, M.D. Gary Fleet, M.D.  Electronically Signed by Joni Reining NP on 10/06/2010 10:26:06 AM Electronically Signed by Charlton Haws MD FACC on 10/06/2010 01:52:55 PM

## 2010-10-07 ENCOUNTER — Encounter: Payer: Self-pay | Admitting: Cardiovascular Disease

## 2010-10-07 ENCOUNTER — Ambulatory Visit (INDEPENDENT_AMBULATORY_CARE_PROVIDER_SITE_OTHER): Payer: Medicaid Other | Admitting: Cardiovascular Disease

## 2010-10-07 ENCOUNTER — Ambulatory Visit (INDEPENDENT_AMBULATORY_CARE_PROVIDER_SITE_OTHER): Payer: Medicaid Other | Admitting: *Deleted

## 2010-10-07 DIAGNOSIS — I2510 Atherosclerotic heart disease of native coronary artery without angina pectoris: Secondary | ICD-10-CM

## 2010-10-07 DIAGNOSIS — Z7901 Long term (current) use of anticoagulants: Secondary | ICD-10-CM

## 2010-10-07 DIAGNOSIS — I129 Hypertensive chronic kidney disease with stage 1 through stage 4 chronic kidney disease, or unspecified chronic kidney disease: Secondary | ICD-10-CM

## 2010-10-07 DIAGNOSIS — I4891 Unspecified atrial fibrillation: Secondary | ICD-10-CM

## 2010-10-07 NOTE — Patient Instructions (Signed)
Your physician recommends that you schedule a follow-up appointment in: 3 months with Dr. Excell Seltzer with fasting lab work same day. Lipid. Liver--414.01

## 2010-10-07 NOTE — Progress Notes (Signed)
HPI:  This is a 75 year old woman presenting for followup evaluation. She has end-stage renal disease on hemodialysis, long-standing history of malignant hypertension, paroxysmal atrial fibrillation, and history of diastolic heart failure. The patient's treatment has always been complicated by multiple drug intolerances. She is been hospitalized a countless number of times in the last year because of atrial fibrillation with RVR. This is been complicated by hyperthyroidism and should the patient has undergone I-131 treatment and is now euthyroid. She was recently hospitalized again with symptomatic atrial fib with RVR. She ultimately underwent cardiac catheterization demonstrating nonobstructive CAD. She was started on amiodarone 100 mg twice daily. The patient has done well over the past few weeks and denies any symptomatic palpitations. She denies chest pain or dyspnea.  Outpatient Encounter Prescriptions as of 10/07/2010  Medication Sig Dispense Refill  . amiodarone (PACERONE) 100 MG tablet Take 100 mg by mouth 2 (two) times daily.        . Calcium Acetate 667 MG TABS Take by mouth 3 (three) times daily before meals.        . cycloSPORINE (RESTASIS) 0.05 % ophthalmic emulsion Place 1 drop into both eyes 2 (two) times daily.        . ranitidine (ZANTAC) 150 MG tablet Take 150 mg by mouth daily.        Marland Kitchen warfarin (COUMADIN) 5 MG tablet Take 1 tablet (5 mg total) by mouth as directed.  50 tablet  3  . DISCONTD: metroNIDAZOLE (FLAGYL) 500 MG tablet Take 500 mg by mouth 3 (three) times daily.          Allergies  Allergen Reactions  . Cardizem (Diltiazem Hcl) Hives  . Codeine   . Iron     ?? Almost died.  . Rena-Vite (Super B-Complex-Vit C-Fa)   . Vancomycin   . Venofer (Iron Sucrose (Iron Oxide Saccharated)) Itching    Past Medical History  Diagnosis Date  . ESRD (end stage renal disease)     HD on Tues, Thurs, Sat // Likely secondary to nephrosclerosis from HTN  . HTN (hypertension)   . HLD  (hyperlipidemia)   . Cholelithiases     S/P biliary colic cholecystostomy, but not cholecystectomy secondary to omental adhesions  . Anxiety   . Anemia     BL Hgb 8-9. Likely AOCD in setting of ESRD with iron 48, ferritin  746 (03/2010)  . PAF (paroxysmal atrial fibrillation)     On chronic coumadin therapy // Followed by Dr. Excell Seltzer (cardiology)  . Non-ST elevation MI (NSTEMI)     Noninvasive evaluation with Myoview negative for ischemia.  // 2-D echo (05/2010) LVEF 65-70%. Grade 1 diastolic dysfunction.  Peak PA pressure 52.  Marland Kitchen Hyperthyroidism DX: 07/2010    Likely Grave's Disease. // On diagnosis, TSH < 0.08, free T4 1.94, free T3 5.3 . //  Thyroid US (07/17/2010) - nonspecific diffuse heterogenous thyroid gland. //  Thyrotropin receptor antibody neg (07/17/2010)    ROS: Negative except as per HPI  BP 180/72  Pulse 60  Wt 103 lb (46.72 kg)  PHYSICAL EXAM: Pt is alert and oriented, thin woman in NAD HEENT: normal Neck: JVP - normal, carotids 2+= without bruits Lungs: CTA bilaterally CV: RRR with grade 2/6 systolic ejection murmur along the left sternal border Abd: soft, NT, Positive BS, no hepatomegaly Ext: no C/C/E, distal pulses intact and equal Skin: warm/dry no rash  ASSESSMENT AND PLAN:

## 2010-10-07 NOTE — Assessment & Plan Note (Signed)
Atrial fib is now better controlled on amiodarone. The patient will continue on this medication and we will order followup LFTs in about 3 months. She is off of the beta blocker because of bradycardia but has nice control of her heart rate on amiodarone.

## 2010-10-07 NOTE — Assessment & Plan Note (Signed)
Blood pressure is elevated in the office as it always is. She reports low blood pressures during hemodialysis. I would not recommend any treatment of her hypertension at present because of the lability of her hypertension and symptomatic hypotension either during or following hemodialysis.

## 2010-10-11 ENCOUNTER — Telehealth: Payer: Self-pay | Admitting: *Deleted

## 2010-10-11 NOTE — Telephone Encounter (Signed)
Pt called with c/o diarrhea.  Onset on hospital d/c 7/14  She was taking metronidazole 500 mg  For 21 days. She has been off for  5 days. Each times she eats she has very soft stool.  Also stool in between meals. She has a hard time holding her stools while at dialysis. She would like something to help her.  Pt # [id number]

## 2010-10-11 NOTE — Telephone Encounter (Signed)
Called her.  She was very cheerful and seems to be doing well.  Only concern was several loose stools a day after 2 weeks of flagyl and problem was that she is unable to sit through 4 hours of HD w/o defecating.  No abd pain, fever or bleeding.  Agreed to buy immodium and use sparingly by directions.

## 2010-10-13 ENCOUNTER — Encounter: Payer: Self-pay | Admitting: Cardiovascular Disease

## 2010-10-17 ENCOUNTER — Telehealth: Payer: Self-pay | Admitting: Internal Medicine

## 2010-10-17 NOTE — Telephone Encounter (Signed)
Decrease to 100mg  daily and keep follow up appt

## 2010-10-17 NOTE — Telephone Encounter (Signed)
Called patient and spoke with her to decrease Amiodarone to 100mg  daily and see if this helps  If no better she will need to follow up with her PCP for stool cultures

## 2010-10-17 NOTE — Telephone Encounter (Addendum)
Pt's husband calling re medication, having nausea and stomach pain, some diarrhea, wants a call this am, not at the end of the day

## 2010-10-17 NOTE — Telephone Encounter (Signed)
Pt husband calls today b/c patient has been having nausea and diarrhea since starting amiodarone during recent hospital stay.  Pt followed up call with pcp last week and discontinued the metronidazole.  She has been taking Immodium per pcp.  This has not helped.  The only new medication was the Amiodarone.  It is difficult to go to dialysis (T,Th,S)  With diarrhea.  Pt would like advice and call  back today. I will forward to Dr. Lubertha Basque nurse Tresa Endo. Mylo Red RN

## 2010-10-24 ENCOUNTER — Telehealth: Payer: Self-pay | Admitting: Internal Medicine

## 2010-10-24 NOTE — Telephone Encounter (Signed)
Per pt husband calling, wanting to speak w/ [name]. Please return call.

## 2010-10-25 NOTE — Telephone Encounter (Signed)
Advised them to call Dr Aundria Rud to see if he can figure out why she is having so much diarrhea  Did not get any better when he decreased the Amiodarone

## 2010-10-28 ENCOUNTER — Ambulatory Visit (INDEPENDENT_AMBULATORY_CARE_PROVIDER_SITE_OTHER): Payer: Medicaid Other | Admitting: *Deleted

## 2010-10-28 DIAGNOSIS — Z7901 Long term (current) use of anticoagulants: Secondary | ICD-10-CM

## 2010-10-28 DIAGNOSIS — I4891 Unspecified atrial fibrillation: Secondary | ICD-10-CM

## 2010-10-28 LAB — POCT INR: INR: 1.5

## 2010-11-03 ENCOUNTER — Ambulatory Visit: Payer: Medicaid Other | Admitting: Internal Medicine

## 2010-11-03 LAB — PROTIME-INR: INR: 2.6 — AB (ref 0.9–1.1)

## 2010-11-07 ENCOUNTER — Ambulatory Visit (INDEPENDENT_AMBULATORY_CARE_PROVIDER_SITE_OTHER): Payer: Self-pay | Admitting: Cardiology

## 2010-11-07 ENCOUNTER — Encounter: Payer: Medicaid Other | Admitting: *Deleted

## 2010-11-07 DIAGNOSIS — R0989 Other specified symptoms and signs involving the circulatory and respiratory systems: Secondary | ICD-10-CM

## 2010-11-11 ENCOUNTER — Ambulatory Visit (INDEPENDENT_AMBULATORY_CARE_PROVIDER_SITE_OTHER): Payer: Medicaid Other | Admitting: *Deleted

## 2010-11-11 DIAGNOSIS — I4891 Unspecified atrial fibrillation: Secondary | ICD-10-CM

## 2010-11-11 DIAGNOSIS — Z7901 Long term (current) use of anticoagulants: Secondary | ICD-10-CM

## 2010-11-11 LAB — POCT INR: INR: 3.7

## 2010-11-14 ENCOUNTER — Other Ambulatory Visit: Payer: Self-pay | Admitting: Adult Health

## 2010-11-15 LAB — PROTIME-INR: INR: 4.5 — AB (ref 0.9–1.1)

## 2010-11-17 ENCOUNTER — Ambulatory Visit (INDEPENDENT_AMBULATORY_CARE_PROVIDER_SITE_OTHER): Payer: Self-pay | Admitting: Cardiology

## 2010-11-17 DIAGNOSIS — R0989 Other specified symptoms and signs involving the circulatory and respiratory systems: Secondary | ICD-10-CM

## 2010-11-23 ENCOUNTER — Ambulatory Visit (INDEPENDENT_AMBULATORY_CARE_PROVIDER_SITE_OTHER): Payer: Medicaid Other | Admitting: *Deleted

## 2010-11-23 DIAGNOSIS — Z7901 Long term (current) use of anticoagulants: Secondary | ICD-10-CM

## 2010-11-23 DIAGNOSIS — I4891 Unspecified atrial fibrillation: Secondary | ICD-10-CM

## 2010-11-24 ENCOUNTER — Other Ambulatory Visit (HOSPITAL_COMMUNITY): Payer: Self-pay | Admitting: Nephrology

## 2010-11-24 DIAGNOSIS — N186 End stage renal disease: Secondary | ICD-10-CM

## 2010-12-05 ENCOUNTER — Ambulatory Visit (HOSPITAL_COMMUNITY)
Admission: RE | Admit: 2010-12-05 | Discharge: 2010-12-05 | Disposition: A | Discharge status: Home / Self Care | Payer: Medicaid Other | Source: Ambulatory Visit | Attending: Nephrology | Admitting: Nephrology

## 2010-12-05 ENCOUNTER — Ambulatory Visit (INDEPENDENT_AMBULATORY_CARE_PROVIDER_SITE_OTHER): Payer: Medicaid Other | Admitting: Internal Medicine

## 2010-12-05 ENCOUNTER — Other Ambulatory Visit (HOSPITAL_COMMUNITY): Payer: Self-pay | Admitting: Nephrology

## 2010-12-05 ENCOUNTER — Encounter: Payer: Self-pay | Admitting: Internal Medicine

## 2010-12-05 ENCOUNTER — Ambulatory Visit: Payer: Medicaid Other | Admitting: Internal Medicine

## 2010-12-05 DIAGNOSIS — T82898A Other specified complication of vascular prosthetic devices, implants and grafts, initial encounter: Secondary | ICD-10-CM | POA: Insufficient documentation

## 2010-12-05 DIAGNOSIS — L98499 Non-pressure chronic ulcer of skin of other sites with unspecified severity: Secondary | ICD-10-CM

## 2010-12-05 DIAGNOSIS — E0590 Thyrotoxicosis, unspecified without thyrotoxic crisis or storm: Secondary | ICD-10-CM

## 2010-12-05 DIAGNOSIS — K2970 Gastritis, unspecified, without bleeding: Secondary | ICD-10-CM | POA: Insufficient documentation

## 2010-12-05 DIAGNOSIS — N186 End stage renal disease: Secondary | ICD-10-CM

## 2010-12-05 DIAGNOSIS — R51 Headache: Secondary | ICD-10-CM

## 2010-12-05 DIAGNOSIS — I129 Hypertensive chronic kidney disease with stage 1 through stage 4 chronic kidney disease, or unspecified chronic kidney disease: Secondary | ICD-10-CM

## 2010-12-05 DIAGNOSIS — B9681 Helicobacter pylori [H. pylori] as the cause of diseases classified elsewhere: Secondary | ICD-10-CM

## 2010-12-05 DIAGNOSIS — Y832 Surgical operation with anastomosis, bypass or graft as the cause of abnormal reaction of the patient, or of later complication, without mention of misadventure at the time of the procedure: Secondary | ICD-10-CM | POA: Insufficient documentation

## 2010-12-05 DIAGNOSIS — A048 Other specified bacterial intestinal infections: Secondary | ICD-10-CM

## 2010-12-05 LAB — CBC
HCT: 33.8 — ABNORMAL LOW
Hemoglobin: 11.6 — ABNORMAL LOW
MCHC: 34.4
MCV: 86.7
RDW: 14.2

## 2010-12-05 LAB — POCT CARDIAC MARKERS
CKMB, poc: 1.4
Operator id: 277751
Operator id: 277751
Troponin i, poc: <0.05

## 2010-12-05 LAB — POCT I-STAT, CHEM 8
Calcium, Ion: 1.17
HCT: 35 — ABNORMAL LOW
TCO2: 28

## 2010-12-05 LAB — URINALYSIS, ROUTINE W REFLEX MICROSCOPIC
Bilirubin Urine: NEGATIVE
Glucose, UA: NEGATIVE
Ketones, ur: NEGATIVE
pH: 6

## 2010-12-05 LAB — URINE MICROSCOPIC-ADD ON

## 2010-12-05 LAB — DIFFERENTIAL
Basophils Absolute: 0
Basophils Relative: 0
Eosinophils Absolute: 0.2
Eosinophils Relative: 2
Monocytes Absolute: 0.5

## 2010-12-05 LAB — URINE CULTURE

## 2010-12-05 MED ORDER — IOHEXOL 300 MG/ML  SOLN
100.0000 mL | Freq: Once | INTRAMUSCULAR | Status: AC | PRN
  Administered 2010-12-05: 65 mL via INTRAVENOUS

## 2010-12-05 MED ORDER — PANTOPRAZOLE SODIUM 40 MG PO TBEC: 40.0000 mg | DELAYED_RELEASE_TABLET | Freq: Every day | ORAL | Status: DC

## 2010-12-05 NOTE — Patient Instructions (Addendum)
Tension Headache (Muscle Contraction Headache) Tension headache is a common cause of head pain. You may feel pain in the top of the head and back of the neck. Stress, feeling worried (anxiety), and feeling sad for a long time (depression) can cause these headaches. This is not life-threatening. It will not lead to other types of headaches. Your doctor might do an exam, tests, or take an X-ray to figure out what is wrong. Your doctor might give you medicine (antidepressants) if depression is a problem. He or she can suggest ways to get help for problems caused by stress or worry. HOME CARE  Only take medicine as told by your doctor.   Helpful ways to relax are:   Using thoughts to control the body (biofeedback).   Massage.   You may put ice or heat packs on the head and neck area.   Put ice in a plastic bag.   Place a towel between your skin and the bag.   Leave the ice on for 15 minutes at a time, 4-6 times a day.   Physical therapy may help.   If headaches continue, you may need to make lifestyle changes.   Avoid using too many pain killers. This can cause more headaches.  FINDING OUT THE RESULTS OF YOUR TEST Ask your doctor when your test results will be ready. Make sure you follow up and get your test results. 1.  GET HELP IF:  You have problems with your medicines or they do not help.   Your headache changes.   You feel sick to your stomach (nauseous) or throw up (vomit).  GET HELP RIGHT AWAY IF:  Your headache gets very bad.   You have a temperature by mouth above 101, not controlled by medicine.   You have a stiff neck, muscle weakness, or loss of muscle control.   You start having new problems (symptoms).   You lose your balance, vision, or have trouble walking.   You feel faint or pass out.  MAKE SURE YOU:  Understand these instructions.   Will watch your condition.   Will get help right away if you are not doing well or get worse.  Document Released:  05/24/2009  [facility] Patient Information 2011 [place], [place].  1. Stop Zantac.  2. Start Protonix 40 mg tablets take 1 tablet at lunch time.  3. You can use tylenol for the pain in her neck.  It is best to take the medication right when you feel like the pain starts as opposed to waiting until the pain is really bad.   4. Continue your medication as prescribed.

## 2010-12-05 NOTE — Progress Notes (Signed)
Subjective:   Patient ID: [name] female   DOB: 1935/06/25 75 y.o.   MRN: [id number]  HPI: [name] is a 75 y.o. woman who presents to clinic today because of several complaints.    She states that she had headaches daily for years, likely secondary to her uncontrolled hypertension, that had gone away with the start of dialysis and partial control her blood pressure.  She states that now for the last 5-6 days the headache has come back.  She also states that her eyes are watering and nose has been running.  She states that the pain is on the right side of her head and is a throbbing pain.  She has some blurry vision and saw her eye doctor who sent her here.  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  She has a long history of intolerance to HTN medication from multiple different classes.  Now she states that she notices that sometimes after dialysis she has blood pressures that drop into the 100s.  She states that when she gets home from dialysis she takes a nap and has some juice and her blood pressure comes back up to normal.  She states that by her next dialysis day her blood pressure is high again.  She has been on dialysis for about 8 months.  She states that they recently lowered her dry weight in dialysis.   Since her last visit with Dr. Aundria Rud she has been treated for her hyperthyroid.  She states she got "a radiation pill which killed my thyroid."  She was started on Levothyroxine and denies palpitations, fast heart rate, chest pain, lumps in her throat, or increased anxiety.    Lastly she states that for several months she has had been having diarrhea after meals.  She states that when she eats she gets a lot of gas and cramping that seems to gets better with BM.  The pain starts 5-30 min after eating.  She was given antibiotics by Dr.  Eliott Nine and finished them about 2 weeks ago.  She continues to have problems with bloating and gas as well as mild abdominal pain that is relieved with bowel movements.  She denies blood in her stool, pain with defecation, nausea, vomiting, fever, or chills.   Past Medical History  Diagnosis Date  . ESRD (end stage renal disease)     HD on Tues, Thurs, Sat // Likely secondary to nephrosclerosis from HTN  . HTN (hypertension)   . HLD (hyperlipidemia)   . Cholelithiases     S/P biliary colic cholecystostomy, but not cholecystectomy secondary to omental adhesions  . Anxiety   . Anemia     BL Hgb 8-9. Likely AOCD in setting of ESRD with iron 48, ferritin  746 (03/2010)  . PAF (paroxysmal atrial fibrillation)     On chronic coumadin therapy // Followed by Dr. Excell Seltzer (cardiology)  . Non-ST elevation MI (NSTEMI)     Noninvasive evaluation with Myoview negative for ischemia.  // 2-D echo (05/2010) LVEF 65-70%. Grade 1 diastolic dysfunction.  Peak PA pressure 52.  Marland Kitchen Hyperthyroidism DX: 07/2010    Likely Grave's Disease. // On diagnosis, TSH < 0.08, free T4 1.94, free T3 5.3 . //  Thyroid US (07/17/2010) - nonspecific diffuse heterogenous thyroid gland. //  Thyrotropin receptor antibody neg (07/17/2010)  Current Outpatient Prescriptions  Medication Sig Dispense Refill  . amiodarone (PACERONE) 100 MG tablet Take 100 mg by mouth 2 (two) times daily.        . Calcium Acetate 667 MG TABS Take by mouth 3 (three) times daily before meals.        . cycloSPORINE (RESTASIS) 0.05 % ophthalmic emulsion Place 1 drop into both eyes 2 (two) times daily.        Marland Kitchen levothyroxine (SYNTHROID, LEVOTHROID) 100 MCG tablet Take 100 mcg by mouth daily.        . ranitidine (ZANTAC) 150 MG tablet Take 150 mg by mouth daily.        Marland Kitchen warfarin (COUMADIN) 5 MG tablet Take 1 tablet (5 mg total) by mouth as directed.  50 tablet  3   No current facility-administered medications for this visit.   Facility-Administered  Medications Ordered in Other Visits  Medication Dose Route Frequency Provider Last Rate Last Dose  . iohexol (OMNIPAQUE) 300 MG/ML injection 100 mL  100 mL Intravenous Once PRN Medication Radiologist   65 mL at 12/05/10 1449   Family History  Problem Relation Age of Onset  . Stroke    . Heart attack     History   Social History  . Marital Status: Married    Spouse Name: N/A    Number of Children: 0  . Years of Education: 66 th grad   Occupational History  .     Social History Main Topics  . Smoking status: Never Smoker   . Smokeless tobacco: None  . Alcohol Use: No  . Drug Use: No  . Sexually Active: None   Other Topics Concern  . None   Social History Narrative  . None   Review of Systems: Negative except for as noted in the HPI.   Objective:  Physical Exam: Filed Vitals:   12/05/10 1518  BP: 176/50  Pulse: 58  Temp: 97 F (36.1 C)  TempSrc: Oral  Height: 5\' 2"  (1.575 m)  Weight: 110 lb 3.2 oz (49.986 kg)   Constitutional: Vital signs reviewed.  Patient is a thin, elderly woman in no acute distress and cooperative with exam. Alert and oriented x3.  Head: Normocephalic and atraumatic, there is no palpable temporal artery on either side.  No pain to palpation.   Ear: TM normal bilaterally Mouth: no erythema or exudates, MMM Eyes: PERRL, EOMI, conjunctivae normal, No scleral icterus.  Neck: Supple, mild tenderness to palpation in the paraspinous muscles in the cervical spine. Trachea midline normal ROM, No JVD, mass, thyromegaly, or carotid bruit present.  Cardiovascular: RRR, S1 normal, S2 normal, no MRG, pulses symmetric and intact bilaterally Pulmonary/Chest: CTAB, no wheezes, rales, or rhonchi Abdominal: Soft.  Mild epigastric tenderness to palpation.  non-distended, bowel sounds are normal, no masses, organomegaly, or guarding present.  GU: no CVA tenderness Musculoskeletal: No joint deformities, erythema, or stiffness, ROM full and no  nontender Hematology: no cervical, inginal, or axillary adenopathy.  Neurological: A&O x3, Strenght is normal and symmetric bilaterally, cranial nerve II-XII are grossly intact, no focal motor deficit, sensory intact to light touch bilaterally.  Skin: Warm, dry and intact. No rash, cyanosis, or clubbing.  Psychiatric: Normal mood and affect. speech and behavior is normal. Judgment and thought content normal. Cognition and memory are normal.   Assessment & Plan:

## 2010-12-07 DIAGNOSIS — N186 End stage renal disease: Secondary | ICD-10-CM

## 2010-12-07 DIAGNOSIS — Z992 Dependence on renal dialysis: Secondary | ICD-10-CM | POA: Insufficient documentation

## 2010-12-08 LAB — PROTIME-INR

## 2010-12-09 ENCOUNTER — Encounter: Payer: Self-pay | Admitting: Cardiovascular Disease

## 2010-12-13 ENCOUNTER — Encounter (INDEPENDENT_AMBULATORY_CARE_PROVIDER_SITE_OTHER): Payer: Self-pay | Admitting: General Surgery

## 2010-12-13 ENCOUNTER — Ambulatory Visit (INDEPENDENT_AMBULATORY_CARE_PROVIDER_SITE_OTHER): Payer: Medicaid Other | Admitting: General Surgery

## 2010-12-13 VITALS — BP 126/68 | HR 76 | Temp 98.0°F | Resp 12 | Ht 63.0 in | Wt 106.6 lb

## 2010-12-13 DIAGNOSIS — R51 Headache: Secondary | ICD-10-CM

## 2010-12-13 DIAGNOSIS — N189 Chronic kidney disease, unspecified: Secondary | ICD-10-CM

## 2010-12-13 NOTE — Progress Notes (Signed)
Subjective:     Patient ID: [name], female   DOB: [birth year], 76 y.o.   MRN: [id number]  HPIThe patient's a 75 year old female with Dr. Aundria Rud call me Thursday she is saying I. ophthalmologist and a question of whether she could have temporal arteritis because of some visual changes and he was tried arrange for a temporal artery biopsy her past history is significant in that she had a stroke to the left eye probably 20 years ago that has basically no vision on the left and now a question of whether she is having temporal arteritis symptoms. The patient is a cardiac and also a chronic renal failure patient on Coumadin for history of atrial fibrillation and also dialysis on Tuesday Thursday Saturday she had a cholecystectomy by Dr. Hessie Diener back in [place] and she's not had problems with a right upper quadrant abdominal pain problems   Review of Systems Past Medical History  Diagnosis Date  . ESRD (end stage renal disease)     HD on Tues, Thurs, Sat // Likely secondary to nephrosclerosis from HTN  . HTN (hypertension)   . HLD (hyperlipidemia)   . Cholelithiases     S/P biliary colic cholecystostomy, but not cholecystectomy secondary to omental adhesions  . Anxiety   . Anemia     BL Hgb 8-9. Likely AOCD in setting of ESRD with iron 48, ferritin  746 (03/2010)  . PAF (paroxysmal atrial fibrillation)     On chronic coumadin therapy // Followed by Dr. Excell Seltzer (cardiology)  . Non-ST elevation MI (NSTEMI)     Noninvasive evaluation with Myoview negative for ischemia.  // 2-D echo (05/2010) LVEF 65-70%. Grade 1 diastolic dysfunction.  Peak PA pressure 52.  Marland Kitchen Hyperthyroidism DX: 07/2010    Likely Grave's Disease. // On diagnosis, TSH < 0.08, free T4 1.94, free T3 5.3 . //  Thyroid US (07/17/2010) - nonspecific diffuse heterogenous thyroid gland. //  Thyrotropin receptor antibody neg (07/17/2010)   Current Outpatient Prescriptions  Medication Sig Dispense Refill  . amiodarone (PACERONE) 100 MG  tablet Take 100 mg by mouth 2 (two) times daily.        . brimonidine (ALPHAGAN P) 0.1 % SOLN        . Calcium Acetate 667 MG TABS Take by mouth 3 (three) times daily before meals.        Marland Kitchen levothyroxine (SYNTHROID, LEVOTHROID) 100 MCG tablet Take 100 mcg by mouth daily.        . predniSONE (DELTASONE) 50 MG tablet Take 50 mg by mouth daily.        . ranitidine (ZANTAC) 150 MG tablet Take 150 mg by mouth 2 (two) times daily.        Marland Kitchen warfarin (COUMADIN) 5 MG tablet Take 1 tablet (5 mg total) by mouth as directed.  50 tablet  3   Allergies  Allergen Reactions  . Cardizem (Diltiazem Hcl) Hives  . Codeine   . Iron     ?? Almost died.  . Latex   . Rena-Vite (Super B-Complex-Vit C-Fa)   . Vancomycin   . Venofer (Iron Sucrose (Iron Oxide Saccharated)) Itching         Objective:   Physical ExamBP 126/68  Pulse 76  Temp(Src) 98 F (36.7 C) (Temporal)  Resp 12  Ht 5\' 3"  (1.6 m)  Wt 106 lb 9.6 oz (48.353 kg)  BMI 18.88 kg/m2 Patient is an elderly female in no acute distress she does have a palpable pulse in the right temporal  artery area and states that she gets kind of headaches but they appear to be more related to post dialysis headache than what I would think his varus temporal arteritis. She does not have any nodules that I can appreciate in the right temporal area she did stop taking the Coumadin last Friday in preparation for temporal artery biopsy right    Assessment:    Questionable diagnosis of temporal arteritis temporal artery biopsy needed. She does have a mildly elevated sedimentation rate     Plan:    Temporal artery biopsy right scheduled for tomorrow at 10 day surgery local anesthesia. She can have breakfast and wait until after the procedure for lunch. No labs needed

## 2010-12-14 ENCOUNTER — Ambulatory Visit (HOSPITAL_BASED_OUTPATIENT_CLINIC_OR_DEPARTMENT_OTHER)
Admission: RE | Admit: 2010-12-14 | Discharge: 2010-12-14 | Disposition: A | Discharge status: Home / Self Care | Payer: Medicaid Other | Source: Ambulatory Visit | Attending: General Surgery | Admitting: General Surgery

## 2010-12-14 ENCOUNTER — Ambulatory Visit (INDEPENDENT_AMBULATORY_CARE_PROVIDER_SITE_OTHER): Payer: Medicaid Other | Admitting: *Deleted

## 2010-12-14 ENCOUNTER — Other Ambulatory Visit (INDEPENDENT_AMBULATORY_CARE_PROVIDER_SITE_OTHER): Payer: Self-pay | Admitting: General Surgery

## 2010-12-14 DIAGNOSIS — R51 Headache: Secondary | ICD-10-CM | POA: Insufficient documentation

## 2010-12-14 DIAGNOSIS — R519 Headache, unspecified: Secondary | ICD-10-CM | POA: Insufficient documentation

## 2010-12-14 DIAGNOSIS — R700 Elevated erythrocyte sedimentation rate: Secondary | ICD-10-CM

## 2010-12-14 DIAGNOSIS — Z7901 Long term (current) use of anticoagulants: Secondary | ICD-10-CM

## 2010-12-14 DIAGNOSIS — I4891 Unspecified atrial fibrillation: Secondary | ICD-10-CM

## 2010-12-14 HISTORY — PX: OTHER SURGICAL HISTORY: SHX169

## 2010-12-14 NOTE — Assessment & Plan Note (Addendum)
She underwent radiation therapy for her thyroid and now is on replacement therapy.  She appears to be doing well.  She is due for a follow up TSH soon to follow her therapy.

## 2010-12-14 NOTE — Assessment & Plan Note (Signed)
She recently finished a course of antibiotics that she states were for her stomach.  She has been taking the Zantac and states that it has not been helping.  We will switch her over to PPI therapy and have her follow up. Since she is on coumadin we will do Protonix.

## 2010-12-14 NOTE — Assessment & Plan Note (Signed)
Her headache does not appear to be temporal arteritis as she has no vision changes, palpable pulse or nodule in her temporal artery.   She has some muscular tenderness which could come from recent radioablation of the thyroid or muscle spasm.  They would like to see her surgeon which I informed them was fine if they wanted a second opinion or desired a biopsy.

## 2010-12-14 NOTE — Assessment & Plan Note (Signed)
Lab Results  Component Value Date   NA 136 09/24/2010   K 3.2* 09/24/2010   CL 98 09/24/2010   CO2 23 09/24/2010   BUN 42* 09/24/2010   CREATININE 6.05* 09/24/2010    BP Readings from Last 3 Encounters:  12/13/10 126/68  12/05/10 176/50  10/07/10 180/72    Assessment: Hypertension control:  mildly elevated Blood pressure on dialysis days is lower after dialysis but will spike up before.  She is intolerent to multiple medications from multiple classes.   Progress toward goals:  improved Barriers to meeting goals:  nonadherence to medications, adverse effects of medications and lack of understanding of disease management  Plan: Hypertension treatment:  continue current medications

## 2010-12-22 ENCOUNTER — Encounter (INDEPENDENT_AMBULATORY_CARE_PROVIDER_SITE_OTHER): Payer: Medicaid Other | Admitting: General Surgery

## 2010-12-23 ENCOUNTER — Ambulatory Visit (INDEPENDENT_AMBULATORY_CARE_PROVIDER_SITE_OTHER): Payer: Medicaid Other | Admitting: General Surgery

## 2010-12-23 ENCOUNTER — Other Ambulatory Visit: Payer: Self-pay | Admitting: Ophthalmology

## 2010-12-23 DIAGNOSIS — IMO0002 Reserved for concepts with insufficient information to code with codable children: Secondary | ICD-10-CM

## 2010-12-23 DIAGNOSIS — Z4802 Encounter for removal of sutures: Secondary | ICD-10-CM

## 2010-12-23 NOTE — Progress Notes (Signed)
Removed sutures from right temporal biopsy site, incision well healed and intact.  Placed Steri-strips over the incision area.  Patient will follow up with Dr. Zachery Dakins 12/30/10.

## 2010-12-28 NOTE — Op Note (Signed)
NAMEVINCENZINA, [name] NO.:  [id number]  MEDICAL RECORD NO.:  [id number]  LOCATION:                                 FACILITY:  PHYSICIAN:  Anselm Pancoast. Keyoni Lapinski, M.D.DATE OF BIRTH:  05/25/1935  DATE OF PROCEDURE:  12/14/2010 DATE OF DISCHARGE:                              OPERATIVE REPORT   PREOPERATIVE DIAGNOSES:  Rule out temporal arteritis, history of right- sided headache, elevated sed rate, and having some pad changes of questionable etiology.  ANESTHESIA:  Local anesthesia, minor surgery room.  SURGEON:  Anselm Pancoast. Zachery Dakins, MD.  HISTORY:  [name] is a 75 year old Caucasian female, dialysis patient, history of atrial fibrillation, who has been followed by Dr. Ulyess Mort of many years.  She is a renal failure patient now and recently has been having some changes in her right eye.  She is lying in the left eye from a stroke 20 years ago.  She saw an ophthalmologist and they questioned whether she could be having changes of temporal arteritis.  Her sed rate is mildly elevated and Dr. Aundria Rud called me on Thursday and said it was anyway I could do a temporal artery biopsy. She was in need of it.  She was on Coumadin, this was discontinued, starting Friday, and she is here today, Wednesday for the right temporal artery biopsy.  The patient has been seen in the office, and on questioning, it certainly not a classical temporal arteritis setting, but we will see what the past shows.  DESCRIPTION OF PROCEDURE:  The patient was positioned on OR table, permit had been signed, I clipped the hair over the area, and first using Doppler kind of visualized though locate the artery kind of easily.  She has done enough that you can actually see the little vessel and then I anesthetized the area for approximately 5 mL of 1% plain Xylocaine.  A little incision was made.  Sharp dissection down and over the area you can see a little pulsatile vessel, and then  kind of goes back and forth, kind of like a Z form and I straightened the area, we were confident that this was definitely the temporal artery at the first bifurcation.  I went ahead and ligated the proximal part with 4-0 Vicryl and then divided the vessel.  The other two ends were individually ligated some segment of an inch and half was taken.  The specimen was sent for permanent path exam.  I can tell grossly whether it looks abnormal or not, I think it looks more normal and not pending.  The patient tolerated the procedure nicely.  I closed the wound with some interrupted sutures of 4-0 Vicryl and then a 5-0 Prolene running suture which I will take out in the office next week.  The patient tolerated the procedure nicely.  She can resume her Coumadin at this time.  We will have hopefully the path report on Friday.  We will notify the patient and Dr. Aundria Rud.     Anselm Pancoast. Zachery Dakins, M.D.     WJW/MEDQ  D:  12/14/2010  T:  12/14/2010  Job:  [id number]  cc:   C. Ulyess Mort, M.D.  Electronically Signed  by Consuello Bossier M.D. on 12/28/2010 08:52:53 AM

## 2010-12-29 ENCOUNTER — Encounter (INDEPENDENT_AMBULATORY_CARE_PROVIDER_SITE_OTHER): Payer: Self-pay | Admitting: General Surgery

## 2010-12-30 ENCOUNTER — Ambulatory Visit (INDEPENDENT_AMBULATORY_CARE_PROVIDER_SITE_OTHER): Payer: Medicaid Other | Admitting: General Surgery

## 2010-12-30 ENCOUNTER — Encounter (INDEPENDENT_AMBULATORY_CARE_PROVIDER_SITE_OTHER): Payer: Self-pay | Admitting: General Surgery

## 2010-12-30 VITALS — BP 142/52 | HR 60 | Temp 97.5°F | Resp 16 | Ht 63.0 in | Wt 108.5 lb

## 2010-12-30 DIAGNOSIS — Z9889 Other specified postprocedural states: Secondary | ICD-10-CM

## 2010-12-30 NOTE — Patient Instructions (Signed)
The Steri-Strips have come off in the near future otherwise you can remove them Dr. Aundria Rud has received a copy of your pathology report that did not show any temporal arteritis

## 2010-12-30 NOTE — Progress Notes (Signed)
Subjective:     Patient ID: [name], female   DOB: 10/31/1935, 75 y.o.   MRN: [id number]  HPIThe patient had a right temporal artery biopsy approximately 2 weeks ago and returns for wound check. Robyn had removed the sutures the wound is Steri-Stripped and its healing nicely. The posterior throat did not show any evidence of temporal arteritis or abnormality and Dr. Aundria Rud had thought that this was most likely not a temporal arteritis.  Review of Systems     Objective:   Physical ExamBP 142/52  Pulse 60  Temp 97.5 F (36.4 C)  Resp 16  Ht 5\' 3"  (1.6 m)  Wt 108 lb 8 oz (49.215 kg)  BMI 19.22 kg/m2  The patient has a recent incision is Steri-Stripped no evidence of any inflammation or problems and has been informed that the temporal artery biopsy was negative she has not seen Dr. Aundria Rud since he called me about this but he should have received a copy of the path report. The ophthalmologist who originally raised the question has changed the patient's glasses and did not recommend any other treatment    Assessment:         Plan:

## 2011-01-04 ENCOUNTER — Encounter: Payer: Self-pay | Admitting: Internal Medicine

## 2011-01-04 ENCOUNTER — Other Ambulatory Visit: Payer: Self-pay | Admitting: Ophthalmology

## 2011-01-04 ENCOUNTER — Ambulatory Visit (INDEPENDENT_AMBULATORY_CARE_PROVIDER_SITE_OTHER): Payer: Medicaid Other | Admitting: Internal Medicine

## 2011-01-04 VITALS — BP 170/52 | HR 66 | Temp 97.9°F | Wt 109.8 lb

## 2011-01-04 DIAGNOSIS — M542 Cervicalgia: Secondary | ICD-10-CM

## 2011-01-04 NOTE — Progress Notes (Signed)
67 woman with ESRD on HD, AFib on amiodarone and warfarin, and recent RAI Rx for hyperthyroidism, now on 100 mcg of T4.  These problems are all managed by relevant specialists (nephrology, [place] cards, and Dr. Lucianne Muss).  She also has severe bilat eye disease and has lost most of her vision.  She sees Dr. Karleen Hampshire who was concerned about possible temporal arteritis leading to TA Bx which was negative.  The default diagnosis is microvascular disease.    She has always had diffuse discomforts, some of which are doubtless related to her serious illnesses and some are drug intolerances.  She currently complains of diarrhea and headaches.  The diarrhea is 2-3 times a day and is not severe; sometimes occurs at night.  The headaches seem to be triggered by dialysis.  There have been concerns about excessive BP changes during HD and I did discuss this with [name] of renal.  Her neck pain is positional and intermittent w/o radicular radiation.  She associates it with air-conditioning in HD.  She has some relief from heat but has not used this often.  She is guarded about any new medicine including tylenol.  We discussed all these issues.  No changes today other than advice and explanation.  She has f/u with all of her specialists.

## 2011-01-05 ENCOUNTER — Encounter: Payer: Self-pay | Admitting: Cardiovascular Disease

## 2011-01-05 NOTE — Telephone Encounter (Signed)
done

## 2011-01-09 ENCOUNTER — Ambulatory Visit (INDEPENDENT_AMBULATORY_CARE_PROVIDER_SITE_OTHER): Payer: Medicaid Other | Admitting: *Deleted

## 2011-01-09 ENCOUNTER — Ambulatory Visit (INDEPENDENT_AMBULATORY_CARE_PROVIDER_SITE_OTHER): Payer: Medicaid Other | Admitting: Cardiovascular Disease

## 2011-01-09 ENCOUNTER — Telehealth: Payer: Self-pay | Admitting: Cardiovascular Disease

## 2011-01-09 ENCOUNTER — Encounter: Payer: Self-pay | Admitting: Cardiovascular Disease

## 2011-01-09 VITALS — BP 152/60 | HR 54 | Resp 18 | Ht 63.0 in | Wt 108.0 lb

## 2011-01-09 DIAGNOSIS — I2510 Atherosclerotic heart disease of native coronary artery without angina pectoris: Secondary | ICD-10-CM

## 2011-01-09 DIAGNOSIS — Z7901 Long term (current) use of anticoagulants: Secondary | ICD-10-CM

## 2011-01-09 DIAGNOSIS — I129 Hypertensive chronic kidney disease with stage 1 through stage 4 chronic kidney disease, or unspecified chronic kidney disease: Secondary | ICD-10-CM

## 2011-01-09 DIAGNOSIS — I4891 Unspecified atrial fibrillation: Secondary | ICD-10-CM

## 2011-01-09 DIAGNOSIS — E785 Hyperlipidemia, unspecified: Secondary | ICD-10-CM

## 2011-01-09 LAB — LIPID PANEL
Cholesterol: 229 mg/dL — ABNORMAL HIGH (ref 0–200)
Total CHOL/HDL Ratio: 4

## 2011-01-09 LAB — BASIC METABOLIC PANEL
BUN: 40 mg/dL — ABNORMAL HIGH (ref 6–23)
CO2: 25 mEq/L (ref 19–32)
Calcium: 8.7 mg/dL (ref 8.4–10.5)
Chloride: 102 mEq/L (ref 96–112)
Creatinine, Ser: 7 mg/dL (ref 0.4–1.2)
Glucose, Bld: 87 mg/dL (ref 70–99)

## 2011-01-09 LAB — HEPATIC FUNCTION PANEL
AST: 13 U/L (ref 0–37)
Albumin: 3.4 g/dL — ABNORMAL LOW (ref 3.5–5.2)
Alkaline Phosphatase: 82 U/L (ref 39–117)
Bilirubin, Direct: 0 mg/dL (ref 0.0–0.3)
Total Bilirubin: 0.4 mg/dL (ref 0.3–1.2)

## 2011-01-09 NOTE — Telephone Encounter (Signed)
I spoke with the patient's ophthalmologist, Dr. Karleen Hampshire. He reviewed findings of amiodarone visual toxicity and recommended discontinuing this drug.  I spoke with the patient's husband and the communicated this with him. They will stop amiodarone effective today. We'll plan on starting flecainide 50 mg twice daily in approximately 2 weeks. May need to check an amiodarone level prior to this. I will discuss with Dr. Ladona Ridgel.

## 2011-01-09 NOTE — Progress Notes (Signed)
HPI:  This is a 75 year old woman presenting for followup evaluation. The patient has paroxysmal atrial fibrillation which has been very difficult to control until amiodarone and she now has done very well with no recurrence in several months since she has been therapeutic.  The patient has a history of non-ST elevation infarction in the setting of rapid atrial fibrillation but nuclear stress testing and cardiac catheterization have been unremarkable, showing no ischemia and no significant CAD. The patient has had no shortness of breath or edema. Her main complaint is progressive vision loss. She has also had frequent hypotension during dialysis. She complains of nausea and chest pain when she becomes hypotensive.  Adjustments have been made but she continues to have problems with this. She has been evaluated by ophthalmology and has even undergone temporal artery biopsy which was negative. I spoke with Dr. Karleen Hampshire this morning and she has not had typical findings of amiodarone-related visual toxicity.  Outpatient Encounter Prescriptions as of 01/09/2011  Medication Sig Dispense Refill  . amiodarone (PACERONE) 100 MG tablet Take 100 mg by mouth daily.        . brimonidine (ALPHAGAN P) 0.1 % SOLN        . Calcium Acetate 667 MG TABS Take by mouth 3 (three) times daily before meals.        Marland Kitchen levothyroxine (SYNTHROID) 137 MCG tablet Take 137 mcg by mouth daily.        . predniSONE (DELTASONE) 50 MG tablet Take 50 mg by mouth daily.        . ranitidine (ZANTAC) 150 MG tablet Take 150 mg by mouth 2 (two) times daily.        Marland Kitchen warfarin (COUMADIN) 5 MG tablet Take 1 tablet (5 mg total) by mouth as directed.  50 tablet  3  . DISCONTD: amiodarone (PACERONE) 100 MG tablet Take 100 mg by mouth daily.       Marland Kitchen DISCONTD: levothyroxine (SYNTHROID, LEVOTHROID) 100 MCG tablet Take 100 mcg by mouth daily.          Allergies  Allergen Reactions  . Cardizem (Diltiazem Hcl) Hives  . Codeine   . Iron     ?? Almost  died.  . Latex   . Rena-Vite (Super B-Complex-Vit C-Fa)   . Vancomycin   . Venofer (Iron Sucrose (Iron Oxide Saccharated)) Itching    Past Medical History  Diagnosis Date  . ESRD (end stage renal disease)     HD on Tues, Thurs, Sat // Likely secondary to nephrosclerosis from HTN  . HTN (hypertension)   . HLD (hyperlipidemia)   . Cholelithiases     S/P biliary colic cholecystostomy, but not cholecystectomy secondary to omental adhesions  . Anxiety   . Anemia     BL Hgb 8-9. Likely AOCD in setting of ESRD with iron 48, ferritin  746 (03/2010)  . PAF (paroxysmal atrial fibrillation)     On chronic coumadin therapy // Followed by Dr. Excell Seltzer (cardiology)  . Non-ST elevation MI (NSTEMI)     Noninvasive evaluation with Myoview negative for ischemia.  // 2-D echo (05/2010) LVEF 65-70%. Grade 1 diastolic dysfunction.  Peak PA pressure 52.  Marland Kitchen Hyperthyroidism DX: 07/2010    Likely Grave's Disease. // On diagnosis, TSH < 0.08, free T4 1.94, free T3 5.3 . //  Thyroid US (07/17/2010) - nonspecific diffuse heterogenous thyroid gland. //  Thyrotropin receptor antibody neg (07/17/2010)    ROS: Negative except as per HPI  BP 152/60  Pulse 54  Resp 18  Ht 5\' 3"  (1.6 m)  Wt 108 lb (48.988 kg)  BMI 19.13 kg/m2  PHYSICAL EXAM: Pt is alert and oriented, NAD HEENT: normal Neck: JVP - normal, carotids 2+= without bruits Lungs: CTA bilaterally CV: bradycardic and regular without murmur or gallop Abd: soft, NT, Positive BS, no hepatomegaly Ext: no C/C/E, distal pulses intact and equal Skin: warm/dry no rash  EKG:  Sinus bradycardia 54 beats per minute otherwise within normal limits.  ASSESSMENT AND PLAN:

## 2011-01-09 NOTE — Assessment & Plan Note (Signed)
Will continue amiodarone 100 mg daily. The patient has multiple medication intolerances to almost all other classes of cardiac medications. These have been well documented in past notes. Thyroid studies have been monitored by Dr. Lucianne Muss. Will check LFTs today for amiodarone monitoring. If she has to stop amiodarone we could consider a trial of flecainide.

## 2011-01-09 NOTE — Patient Instructions (Addendum)
Your physician recommends that you continue on your current medications as directed. Please refer to the Current Medication list given to you today.  Your physician wants you to follow-up in: 6 MONTHS.  You will receive a reminder letter in the mail two months in advance. If you don't receive a letter, please call our office to schedule the follow-up appointment.  Your physician recommends that you have lab work today: BMP, LIPID, LIVER (TSH was checked by Dr Lucianne Muss)

## 2011-01-10 NOTE — Telephone Encounter (Addendum)
[name], my recollection was that we tried fledainide several yrs ago. I suspect she will have amio in her system for a while. I would consider waiting until she has more atrial fib and then placing a PPM and AV node ablation rather than using flecainide. If she is approaching dialysis then would like to do this before dialysis is initiated. There is obviously no good options for her and if you would like me to see her I will. ----- Message ----- From: Lauren Chapman, MD Sent: 01/09/2011 9:44 AM To: [name], Lauren Bunting, MD      THIS IS THE NOTE FROM DR Ladona Ridgel

## 2011-01-11 ENCOUNTER — Telehealth: Payer: Self-pay | Admitting: *Deleted

## 2011-01-11 NOTE — Progress Notes (Signed)
Patient came in and gave copy of labs

## 2011-01-11 NOTE — Telephone Encounter (Signed)
Patient came in and picked up copy of labs.  Sent labs to Dr Caryn Section, Dr Aundria Rud, and Dr Aura Camps at patient request

## 2011-01-11 NOTE — Telephone Encounter (Signed)
Message copied by Burnell Blanks on Wed Jan 11, 2011  4:05 PM ------      Message from: Tonny Bollman      Created: Mon Jan 09, 2011  5:46 PM       Lipids are elevated, but patient has intolerance to almost all medications. She has no significant coronary artery disease. Continue diet and lifestyle modification. She has end-stage renal disease and her electrolytes are followed at the kidney center.

## 2011-01-12 ENCOUNTER — Telehealth: Payer: Self-pay | Admitting: Cardiovascular Disease

## 2011-01-12 NOTE — Telephone Encounter (Signed)
SPOKE WITH PT'S HUSBAND HR TODAY WAS 80  HAS STOPPED TAKING AMIODARONE PER DR COOPER  SEE OTHER PHONE NOTE  INFORMED HUSBAND HR OF 80 IS OKAY WILL DISCUSS WITH DR Excell Seltzer. PER DR COOPER CONT TO MONITOR HR, IF BECOMES GREATER THAN 100 TO CALL BACK. PT'S HUSBAND VERBALIZED UNDERSTANDING./CY

## 2011-01-12 NOTE — Telephone Encounter (Signed)
Pt husband calling wanting to speak to nurse regarding pt condition changing--pt heart rate is increasing ever since pt stopped taking medication Dr. Excell Seltzer recently stopped. Please return pt husband call to discuss further.

## 2011-01-20 ENCOUNTER — Ambulatory Visit: Payer: Medicare Other | Admitting: Internal Medicine

## 2011-01-24 ENCOUNTER — Telehealth: Payer: Self-pay | Admitting: Cardiovascular Disease

## 2011-01-24 NOTE — Telephone Encounter (Signed)
[facility] @ [phone]  01/24/11/km

## 2011-01-31 ENCOUNTER — Telehealth: Payer: Self-pay | Admitting: Cardiovascular Disease

## 2011-01-31 DIAGNOSIS — I4891 Unspecified atrial fibrillation: Secondary | ICD-10-CM

## 2011-01-31 MED ORDER — METOPROLOL TARTRATE 25 MG PO TABS: ORAL_TABLET | ORAL | Status: DC

## 2011-01-31 NOTE — Telephone Encounter (Signed)
The first Metoprolol Rx did not go to the pharmacy electronically.  A new Rx was sent electronically to the pharmacy.

## 2011-01-31 NOTE — Telephone Encounter (Signed)
I spoke with the pt's husband and last night around midnight the pt's pulse was 112 the pt took an Amiodarone at that time (this has been discontinued).  The pt's pulse this morning at 6 AM was 63.  Today the pt has been at dialysis and they are going home to check her pulse now.  I will call them back in 10 minutes.  They would like to know what they need to do when this happens. I spoke with Dr Excell Seltzer and he does not want the pt to take Amiodarone as needed.  Dr Excell Seltzer said to increase the pt's pulse restriction from 100 to 120.  If the pt's pulse is 120 and above Dr Excell Seltzer would like her to take Metoprolol Tartrate 25mg  take one by mouth as needed for pulse greater than 120. The pt should recheck her pulse one hour after taking Metoprolol Tartrate and if her pulse has not improved then they will call the office for further instructions.  The pt's pulse today after dialysis was 85.  I will send in a Rx to [facility] on [address].

## 2011-01-31 NOTE — Telephone Encounter (Signed)
New Msg: Pt husband calling stating pt pt HR is really high. Please return pt call to discuss further.

## 2011-01-31 NOTE — Telephone Encounter (Signed)
Addended by: Iona Coach on: 01/31/2011 03:28 PM   Modules accepted: Orders

## 2011-02-06 ENCOUNTER — Ambulatory Visit (INDEPENDENT_AMBULATORY_CARE_PROVIDER_SITE_OTHER): Payer: Self-pay | Admitting: *Deleted

## 2011-02-06 DIAGNOSIS — I4891 Unspecified atrial fibrillation: Secondary | ICD-10-CM

## 2011-02-06 DIAGNOSIS — Z7901 Long term (current) use of anticoagulants: Secondary | ICD-10-CM

## 2011-02-09 ENCOUNTER — Encounter: Payer: Self-pay | Admitting: Cardiovascular Disease

## 2011-02-13 ENCOUNTER — Other Ambulatory Visit: Payer: Self-pay | Admitting: *Deleted

## 2011-02-13 NOTE — Telephone Encounter (Signed)
Call from husband stating that our office wrote rx for brand name Zantac and insurance would not pay.  He requested that I contact [facility] ([address]) to [phone] and give them the ok to dispense the generic. Request was granted, pt and pharmacy aware.  Will forward to pcp for signature.[name], Amyia Lodwick Cassady12/3/201211:02 AM

## 2011-02-14 MED ORDER — RANITIDINE HCL 150 MG PO TABS: 150.0000 mg | ORAL_TABLET | Freq: Every day | ORAL | Status: DC

## 2011-02-27 ENCOUNTER — Ambulatory Visit (INDEPENDENT_AMBULATORY_CARE_PROVIDER_SITE_OTHER): Payer: Medicaid Other | Admitting: *Deleted

## 2011-02-27 DIAGNOSIS — I4891 Unspecified atrial fibrillation: Secondary | ICD-10-CM

## 2011-02-27 DIAGNOSIS — Z7901 Long term (current) use of anticoagulants: Secondary | ICD-10-CM

## 2011-02-27 LAB — POCT INR: INR: 1.7

## 2011-03-01 ENCOUNTER — Other Ambulatory Visit: Payer: Self-pay | Admitting: Cardiology

## 2011-03-09 ENCOUNTER — Telehealth: Payer: Self-pay | Admitting: *Deleted

## 2011-03-09 LAB — PROTIME-INR: INR: 2.1 — AB (ref 0.9–1.1)

## 2011-03-09 NOTE — Telephone Encounter (Signed)
Call from patient's husband requesting a dermatology referral.  He states that pt's skin is very dry, itchy, irritated because of the dialysis.  They was were told by renal to f/u with pcp or derm.  Pt attempted to make an appt with Dr Yetta Barre with [facility] (who she has seen in the past), but was informed that her pcp will need to approve the referral because of the medicaid.  Appt is currently scheduled for Mon Jan 28 at 3:20pm with Dr Donzetta Starch.  Will send request to pcp for approval.Lauren Confer Cassady12/27/20129:19 AM

## 2011-03-10 ENCOUNTER — Encounter: Payer: Self-pay | Admitting: Internal Medicine

## 2011-03-10 NOTE — Progress Notes (Unsigned)
Patient ID: [name], female   DOB: 11-15-1935, 75 y.o.   MRN: [id number] I reviewed the report sent over by Dr. Dione Booze with concerns for right occipital stroke as well as prior documentation regarding patients vision loss. She had MRI brain last month which showed old left occipital stroke. She is already on coumadin but unfortunately has had sub-therapeutic levels so stroke may very well be likely from an embolic source. She may benefit from the addition of ASA to coumadin since she is chronic under coagulated but at the same time this would need to be weight with her risks for bleeding-also is HD patient. On the same note this could also represent small occipital bleeding-would need to consider repeat CT/MRI. I will defer to Dr. Aundria Rud as this patient needs a goal of care discussion about prognosis/diagnosis/risk/benefits etc... Will make no changes at this time. Will have patient schedule ASAP with Dr. Aundria Rud.

## 2011-03-10 NOTE — Progress Notes (Unsigned)
Message left with [facility] to a make an earlier appt to check pt's INR as eye exam suggests possible (new) right occipital stroke. Also spoke with pt's husband and informed him to f/u with ED if patients develops any more sudden changes in vision and severe HAs like she experienced 6 days ago while getting dialysis.  Pt has a f/u appt with Dr Dione Booze in 2weeks.Kingsley Spittle Cassady12/28/20124:06 PM

## 2011-03-13 ENCOUNTER — Ambulatory Visit (INDEPENDENT_AMBULATORY_CARE_PROVIDER_SITE_OTHER): Payer: Self-pay | Admitting: Internal Medicine

## 2011-03-13 DIAGNOSIS — R0989 Other specified symptoms and signs involving the circulatory and respiratory systems: Secondary | ICD-10-CM

## 2011-03-13 DIAGNOSIS — Z7901 Long term (current) use of anticoagulants: Secondary | ICD-10-CM

## 2011-03-13 DIAGNOSIS — I4891 Unspecified atrial fibrillation: Secondary | ICD-10-CM

## 2011-03-22 ENCOUNTER — Ambulatory Visit (INDEPENDENT_AMBULATORY_CARE_PROVIDER_SITE_OTHER): Payer: Medicaid Other | Admitting: *Deleted

## 2011-03-22 DIAGNOSIS — Z7901 Long term (current) use of anticoagulants: Secondary | ICD-10-CM

## 2011-03-22 DIAGNOSIS — I4891 Unspecified atrial fibrillation: Secondary | ICD-10-CM

## 2011-03-28 ENCOUNTER — Telehealth: Payer: Self-pay | Admitting: Cardiovascular Disease

## 2011-03-28 DIAGNOSIS — I4891 Unspecified atrial fibrillation: Secondary | ICD-10-CM

## 2011-03-28 NOTE — Telephone Encounter (Signed)
I talked with pt's husband, Vincenza Hews. He is concerned because pt was told by Dr Excell Seltzer to only use metoprolol as needed. Pt's husband states Dr Eliott Nine has told pt to take metoprolol 4 times a week on the days that she does not have dialysis and to hold it on the 3 days a week  that she does have dialysis. Pt's husband wants OK from Dr Excell Seltzer for pt to take metoprolol this way since Dr Excell Seltzer told her to take it only when needed. I will forward to Dr Excell Seltzer for review.

## 2011-03-28 NOTE — Telephone Encounter (Signed)
I talked with pt's husband ,Vincenza Hews again. He is concerned about pt taking metoprolol at all. I think he is concerned about pt's history of right occipital stroke and association  with low BP. There is some language barrier.  He is requesting a call from Dr Excell Seltzer. He is aware Dr Excell Seltzer will be in the office tomorrow. I will forward to Dr Excell Seltzer.

## 2011-03-28 NOTE — Telephone Encounter (Signed)
Ok to take on non-dialysis days as Dr Eliott Nine recommended.

## 2011-03-28 NOTE — Telephone Encounter (Signed)
New Msg: Pt calling wanting MD to call dialysis place Dr. Camille Bal [phone]  to change medication and/or orders. Please return pt call to discuss further if necessary.

## 2011-03-29 NOTE — Telephone Encounter (Signed)
The pt's spouse came into the office today and spoke with Dr Excell Seltzer.

## 2011-03-29 NOTE — Telephone Encounter (Signed)
I spoke with the patient's husband in person and on the telephone. I also spoke with [name]. The patient's home blood pressures have been elevated with systolics in the range of 150s to 180s. I agree that metoprolol should be started and the patient understands this. She will take only on nondialysis days because her blood pressure drops with dialysis.

## 2011-04-12 ENCOUNTER — Ambulatory Visit (INDEPENDENT_AMBULATORY_CARE_PROVIDER_SITE_OTHER): Payer: Medicaid Other | Admitting: *Deleted

## 2011-04-12 DIAGNOSIS — I4891 Unspecified atrial fibrillation: Secondary | ICD-10-CM

## 2011-04-12 DIAGNOSIS — Z7901 Long term (current) use of anticoagulants: Secondary | ICD-10-CM

## 2011-04-14 ENCOUNTER — Ambulatory Visit (INDEPENDENT_AMBULATORY_CARE_PROVIDER_SITE_OTHER): Payer: Medicaid Other | Admitting: Internal Medicine

## 2011-04-14 ENCOUNTER — Encounter: Payer: Self-pay | Admitting: Internal Medicine

## 2011-04-14 VITALS — BP 188/55 | HR 55 | Wt 116.9 lb

## 2011-04-14 DIAGNOSIS — I4891 Unspecified atrial fibrillation: Secondary | ICD-10-CM

## 2011-04-14 NOTE — Progress Notes (Signed)
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   Her problems are each followed closely by sub-specialists.  I see her to provide explanation and encouragement.   She had a cataract out 2 months ago with some benefit.  She can count fingers at 5 feet with each eye.  Says she can see TV well from 1 yard distance and that Dr. Dione Booze intends to provide glasses after another month. Dr. Excell Seltzer apparantly has stopped amiodarone and has her only on metoprolol and warfarin. She follows with Dr. Lucianne Muss after I 131 rx for hyperthyroidism.  There has been an extended process of titrating her synthroid dose.  She is convinced that the T4 pills cause her to "shake inside". Reviewed al her meds and there is nothing for me to change today.

## 2011-04-20 ENCOUNTER — Encounter (HOSPITAL_COMMUNITY): Payer: Self-pay | Admitting: *Deleted

## 2011-04-20 ENCOUNTER — Inpatient Hospital Stay (HOSPITAL_COMMUNITY)
Admission: EM | Admit: 2011-04-20 | Discharge: 2011-04-21 | DRG: 308 | Disposition: A | Discharge status: Home / Self Care | Payer: Medicaid Other | Source: Ambulatory Visit | Attending: Cardiology | Admitting: Cardiology

## 2011-04-20 ENCOUNTER — Other Ambulatory Visit: Payer: Self-pay

## 2011-04-20 ENCOUNTER — Emergency Department (HOSPITAL_COMMUNITY): Payer: Medicaid Other

## 2011-04-20 ENCOUNTER — Telehealth: Payer: Self-pay | Admitting: Cardiovascular Disease

## 2011-04-20 DIAGNOSIS — Z888 Allergy status to other drugs, medicaments and biological substances status: Secondary | ICD-10-CM

## 2011-04-20 DIAGNOSIS — E890 Postprocedural hypothyroidism: Secondary | ICD-10-CM | POA: Diagnosis present

## 2011-04-20 DIAGNOSIS — F411 Generalized anxiety disorder: Secondary | ICD-10-CM | POA: Diagnosis present

## 2011-04-20 DIAGNOSIS — Z881 Allergy status to other antibiotic agents status: Secondary | ICD-10-CM

## 2011-04-20 DIAGNOSIS — I4892 Unspecified atrial flutter: Principal | ICD-10-CM | POA: Diagnosis present

## 2011-04-20 DIAGNOSIS — Z7901 Long term (current) use of anticoagulants: Secondary | ICD-10-CM | POA: Diagnosis present

## 2011-04-20 DIAGNOSIS — N039 Chronic nephritic syndrome with unspecified morphologic changes: Secondary | ICD-10-CM | POA: Diagnosis present

## 2011-04-20 DIAGNOSIS — I4891 Unspecified atrial fibrillation: Secondary | ICD-10-CM | POA: Diagnosis present

## 2011-04-20 DIAGNOSIS — I5033 Acute on chronic diastolic (congestive) heart failure: Secondary | ICD-10-CM | POA: Diagnosis present

## 2011-04-20 DIAGNOSIS — N186 End stage renal disease: Secondary | ICD-10-CM | POA: Diagnosis present

## 2011-04-20 DIAGNOSIS — Z79899 Other long term (current) drug therapy: Secondary | ICD-10-CM

## 2011-04-20 DIAGNOSIS — Z91040 Latex allergy status: Secondary | ICD-10-CM

## 2011-04-20 DIAGNOSIS — D631 Anemia in chronic kidney disease: Secondary | ICD-10-CM | POA: Diagnosis present

## 2011-04-20 DIAGNOSIS — Z992 Dependence on renal dialysis: Secondary | ICD-10-CM | POA: Diagnosis present

## 2011-04-20 DIAGNOSIS — E785 Hyperlipidemia, unspecified: Secondary | ICD-10-CM | POA: Diagnosis present

## 2011-04-20 DIAGNOSIS — R0789 Other chest pain: Secondary | ICD-10-CM

## 2011-04-20 DIAGNOSIS — I120 Hypertensive chronic kidney disease with stage 5 chronic kidney disease or end stage renal disease: Secondary | ICD-10-CM | POA: Diagnosis present

## 2011-04-20 DIAGNOSIS — I252 Old myocardial infarction: Secondary | ICD-10-CM

## 2011-04-20 HISTORY — DX: Long term (current) use of anticoagulants: Z79.01

## 2011-04-20 HISTORY — DX: Unspecified atrial flutter: I48.92

## 2011-04-20 LAB — MRSA PCR SCREENING: MRSA by PCR: NEGATIVE

## 2011-04-20 LAB — COMPREHENSIVE METABOLIC PANEL
Albumin: 3.4 g/dL — ABNORMAL LOW (ref 3.5–5.2)
Alkaline Phosphatase: 98 U/L (ref 39–117)
BUN: 19 mg/dL (ref 6–23)
Calcium: 9.4 mg/dL (ref 8.4–10.5)
GFR calc Af Amer: 16 mL/min — ABNORMAL LOW (ref >90)
Glucose, Bld: 93 mg/dL (ref 70–99)
Potassium: 3.8 mEq/L (ref 3.5–5.1)
Sodium: 141 mEq/L (ref 135–145)
Total Protein: 6.8 g/dL (ref 6.0–8.3)

## 2011-04-20 LAB — URINE MICROSCOPIC-ADD ON

## 2011-04-20 LAB — URINALYSIS, ROUTINE W REFLEX MICROSCOPIC
Glucose, UA: NEGATIVE mg/dL
Ketones, ur: NEGATIVE mg/dL
Nitrite: NEGATIVE
Specific Gravity, Urine: 1.007 (ref 1.005–1.030)
pH: 8.5 — ABNORMAL HIGH (ref 5.0–8.0)

## 2011-04-20 LAB — CARDIAC PANEL(CRET KIN+CKTOT+MB+TROPI)
CK, MB: 2.4 ng/mL (ref 0.3–4.0)
CK, MB: 2.2 ng/mL (ref 0.3–4.0)
Relative Index: INVALID (ref 0.0–2.5)
Total CK: 35 U/L (ref 7–177)
Total CK: 36 U/L (ref 7–177)
Troponin I: <0.3 ng/mL (ref <0.30)

## 2011-04-20 LAB — PROTIME-INR
INR: 1.61 — ABNORMAL HIGH (ref 0.00–1.49)
Prothrombin Time: 19.4 seconds — ABNORMAL HIGH (ref 11.6–15.2)

## 2011-04-20 LAB — CBC
HCT: 34.9 % — ABNORMAL LOW (ref 36.0–46.0)
MCH: 29.6 pg (ref 26.0–34.0)
MCHC: 31.5 g/dL (ref 30.0–36.0)
RDW: 17.6 % — ABNORMAL HIGH (ref 11.5–15.5)

## 2011-04-20 LAB — TSH: TSH: 0.218 u[IU]/mL — ABNORMAL LOW (ref 0.350–4.500)

## 2011-04-20 MED ORDER — METOPROLOL TARTRATE 1 MG/ML IV SOLN
5.0000 mg | Freq: Four times a day (QID) | INTRAVENOUS | Status: DC
  Administered 2011-04-21 (×2): 5 mg via INTRAVENOUS
  Filled 2011-04-20 (×6): qty 5

## 2011-04-20 MED ORDER — WARFARIN SODIUM 5 MG PO TABS
5.0000 mg | ORAL_TABLET | Freq: Every day | ORAL | Status: DC
  Administered 2011-04-20: 5 mg via ORAL
  Filled 2011-04-20 (×2): qty 1

## 2011-04-20 MED ORDER — CALCIUM ACETATE 667 MG PO TABS: 1.0000 | ORAL_TABLET | Freq: Three times a day (TID) | ORAL | Status: DC

## 2011-04-20 MED ORDER — METOPROLOL TARTRATE 1 MG/ML IV SOLN
5.0000 mg | Freq: Once | INTRAVENOUS | Status: AC
  Administered 2011-04-20: 5 mg via INTRAVENOUS
  Filled 2011-04-20: qty 5

## 2011-04-20 MED ORDER — SODIUM CHLORIDE 0.9 % IV SOLN: 250.0000 mL | INTRAVENOUS | Status: DC | PRN

## 2011-04-20 MED ORDER — FAMOTIDINE 20 MG PO TABS
20.0000 mg | ORAL_TABLET | Freq: Every day | ORAL | Status: DC
  Administered 2011-04-21: 20 mg via ORAL
  Filled 2011-04-20: qty 1

## 2011-04-20 MED ORDER — LEVOTHYROXINE SODIUM 137 MCG PO TABS
137.0000 ug | ORAL_TABLET | Freq: Every day | ORAL | Status: DC
  Administered 2011-04-21: 137 ug via ORAL
  Filled 2011-04-20 (×2): qty 1

## 2011-04-20 MED ORDER — SODIUM CHLORIDE 0.9 % IJ SOLN: 3.0000 mL | INTRAMUSCULAR | Status: DC | PRN

## 2011-04-20 MED ORDER — ACETAMINOPHEN 325 MG PO TABS: 650.0000 mg | ORAL_TABLET | Freq: Four times a day (QID) | ORAL | Status: DC | PRN

## 2011-04-20 MED ORDER — CALCIUM CARBONATE 1250 (500 CA) MG PO TABS
1.0000 | ORAL_TABLET | Freq: Three times a day (TID) | ORAL | Status: DC
  Filled 2011-04-20 (×4): qty 1

## 2011-04-20 MED ORDER — ACETAMINOPHEN 650 MG RE SUPP: 650.0000 mg | Freq: Four times a day (QID) | RECTAL | Status: DC | PRN

## 2011-04-20 MED ORDER — SODIUM CHLORIDE 0.9 % IJ SOLN
3.0000 mL | Freq: Two times a day (BID) | INTRAMUSCULAR | Status: DC
  Administered 2011-04-20 – 2011-04-21 (×2): 3 mL via INTRAVENOUS

## 2011-04-20 NOTE — Telephone Encounter (Signed)
I spoke with the pt's husband and made him aware that I have informed Dr Excell Seltzer that the pt has gone to the ER.

## 2011-04-20 NOTE — Progress Notes (Signed)
Line pulled from LUA fistula. Pressure held x on both arterial and venous site. No bleeding noted. Pressure dsgs applied.

## 2011-04-20 NOTE — ED Notes (Signed)
Dialysis to come and de-access lines from pt's HD graft

## 2011-04-20 NOTE — ED Notes (Signed)
Per ems- Pt was at dialysis when she began having tachycardia. ems was called and pt only received 2 hours of her four hour treatment. Pt was given 25mg  po of lopressor at 10:00. Pt was having mid sternal chest pain and that radiated to her back.

## 2011-04-20 NOTE — ED Notes (Signed)
[id number]

## 2011-04-20 NOTE — Progress Notes (Signed)
PHARMACIST - PHYSICIAN COMMUNICATION  DR:  Lyn Hollingshead, et al.  CONCERNING: Pharmacy Care Issues Regarding Warfarin Labs  RECOMMENDATION (Action Taken): A daily protime for three days has been ordered to meet the [facility] Patient safety goal and comply with the current [facility] Pharmacy & Therapeutics Committee policy.   The Pharmacy will defer all warfarin dose order changes and follow up of lab results to the prescriber unless an additional order to initiate a "pharmacy Coumadin consult" is placed.  DESCRIPTION:  While hospitalized, to be in compliance with The Joint Commission National Patient Safety Goals, all patients on warfarin must have a baseline and/or current protime prior to the administration of warfarin. Pharmacy has received your order for warfarin without these required laboratory assessments.  Thank you,  Loura Back, PharmD, BCPS 04/20/2011 3:05 PM

## 2011-04-20 NOTE — ED Notes (Signed)
Pt resting comfortably, family at bedside, HR decreased. New EKG shot and Dr. notified

## 2011-04-20 NOTE — Telephone Encounter (Signed)
Pt husband called and wanted to make sure Dr. Excell Seltzer was aware his wife just went to ED because pt b/p is over 200. I paged card master and let them know.

## 2011-04-20 NOTE — H&P (Signed)
CARDIOLOGY ADMISSION NOTE  Patient ID: [name] MRN: [id number] DOB/AGE: February 20, 1936 76 y.o.  Admit date: 04/20/2011 Primary Physician   Dr. Ulyess Mort Primary Cardiologist   Dr. Tonny Bollman Chief Complaint    Chest pain, palpitation   HPI: [name] is a pleasant 76 year old woman with paroxysmal A. fib, hypertension, hyperlipidemia, hypothyroidism and ESRD on hemodialysis comes to ER with chief complaint of chest pain and palpitations. She was at her baseline when she started getting hemodialysis this morning. About 2 hours into hemodialysis, she started having chest pain and palpitations. Chest pain is diffuse involving bilateral chest and goes to the back. She denies any dizziness, diaphoresis, nausea, vomiting. The pain is similar to her previous episodes of palpitations. She has been tried on multiple medications for A. fib but apparently has reactions to many different meds. She was taking amiodarone until past 2-3 months when it was stopped by Dr. Excell Seltzer due to possible retinal toxicity.  She denies any fever, chills, diarrhea, or blood in stool or urine or abdominal pain.  Past Medical History  Diagnosis Date  . ESRD (end stage renal disease)     HD on Tues, Thurs, Sat // Likely secondary to nephrosclerosis from HTN  . HTN (hypertension)   . HLD (hyperlipidemia)   . Cholelithiases     S/P biliary colic cholecystostomy, but not cholecystectomy secondary to omental adhesions  . Anxiety   . Anemia     BL Hgb 8-9. Likely AOCD in setting of ESRD with iron 48, ferritin  746 (03/2010)  . PAF (paroxysmal atrial fibrillation)     On chronic coumadin therapy // Followed by Dr. Excell Seltzer (cardiology)  . Non-ST elevation MI (NSTEMI)     Noninvasive evaluation with Myoview negative for ischemia.  // 2-D echo (05/2010) LVEF 65-70%. Grade 1 diastolic dysfunction.  Peak PA pressure 52.  Marland Kitchen Hyperthyroidism DX: 07/2010    Likely Grave's Disease. // On diagnosis, TSH < 0.08,  free T4 1.94, free T3 5.3 . //  Thyroid US (07/17/2010) - nonspecific diffuse heterogenous thyroid gland. //  Thyrotropin receptor antibody neg (07/17/2010)    Past Surgical History  Procedure Date  . Other surgical history 02/2009    Cholecystostomy secondary to biliary colic - NO cholecystectomy secondary to omental adhesions - by Dr. Bertram Savin  . Gallbladder surgery   . Breast lumpectomy     right  . Temple artery biopsy 12/14/10    Allergies  Allergen Reactions  . Cardizem (Diltiazem Hcl) Hives  . Codeine   . Iron     ?? Almost died.  . Latex   . Rena-Vite (Super B-Complex-Vit C-Fa)   . Vancomycin   . Venofer (Iron Sucrose (Iron Oxide Saccharated)) Itching   No current facility-administered medications on file prior to encounter.   Current Outpatient Prescriptions on File Prior to Encounter  Medication Sig Dispense Refill  . Calcium Acetate 667 MG TABS Take by mouth 3 (three) times daily before meals.       Marland Kitchen levothyroxine (SYNTHROID) 137 MCG tablet Take 137 mcg by mouth daily.        . ranitidine (ZANTAC) 150 MG tablet Take 1 tablet (150 mg total) by mouth daily.  31 tablet  11  . warfarin (COUMADIN) 5 MG tablet TAKE 1 TABLET BY MOUTH AS DIRECTED  150 tablet  0   History   Social History  . Marital Status: Married    Spouse Name: N/A    Number of Children: 0  .  Years of Education: 57 th grad   Occupational History  .     Social History Main Topics  . Smoking status: Never Smoker   . Smokeless tobacco: Not on file  . Alcohol Use: No  . Drug Use: No  . Sexually Active: Not on file   Other Topics Concern  . Not on file   Social History Narrative  . No narrative on file    Family History  Problem Relation Age of Onset  . Stroke    . Heart attack        Physical Exam: Blood pressure 133/78, pulse 125, temperature 98.5 F (36.9 C), temperature source Oral, resp. rate 15, SpO2 98.00%.  General: resting in bed, NAD HEENT: PERRL, EOMI, no scleral  icterus Cardiac: Irregularly irregular, no rubs, murmurs or gallops, no JVD Pulm: clear to auscultation bilaterally, moving normal volumes of air Abd: soft, nontender, nondistended, BS present Ext: warm and well perfused, no pedal edema Neuro: alert and oriented X3, cranial nerves II-XII grossly intact Psych: Normal mood and affect.  Labs: Lab Results  Component Value Date   BUN 19 04/20/2011   Lab Results  Component Value Date   CREATININE 3.07* 04/20/2011   Lab Results  Component Value Date   NA 141 04/20/2011   K 3.8 04/20/2011   CL 99 04/20/2011   CO2 34* 04/20/2011   Lab Results  Component Value Date   CKTOTAL 34 04/20/2011   CKMB 1.9 04/20/2011   TROPONINI <0.30 04/20/2011   Lab Results  Component Value Date   WBC 3.0* 04/20/2011   HGB 11.0* 04/20/2011   HCT 34.9* 04/20/2011   MCV 94.1 04/20/2011   PLT 169 04/20/2011   Lab Results  Component Value Date   CHOL 229* 01/09/2011   HDL 64.50 01/09/2011   LDLCALC 106* 09/21/2010   LDLDIRECT 155.1 01/09/2011   TRIG 125.0 01/09/2011   CHOLHDL 4 01/09/2011   Lab Results  Component Value Date   ALT 24 04/20/2011   AST 15 04/20/2011   ALKPHOS 98 04/20/2011   BILITOT 0.4 04/20/2011      Radiology:  Chest x-ray: No acute abnormalities.  EKG:  A. Flutter.  ASSESSMENT AND PLAN:   1) Atrial flutter with rapid ventricular rate: Patient seems to have atrial flutter per 12 lead EKG. Patient symptomatic with chest pain but no syncope or dyspnea. Patient with paroxysmal A. Fib- on Toprol and Coumadin followed by Dr. Excell Seltzer. Ablation procedure was considered as patient intolerant to multiple antiarrhythmics.  Plan: Admit to step down unit. - Start on metoprolol 5 mg IV every 6 hours for rate control as patient allergic to Cardizem. - Consider ablation procedure and EP consult. - Continue Coumadin 5 mg daily.  2) History of non-STEMI: In March 2012- with negative Myoview and cardiac cath on 09/21/2010 per Dr. Peter [place]- showing minimal  nonobstructive atherosclerotic CAD with normal LV function. Current presentation unlikely ACS. - Cycle cardiac enzymes  3) ESRD on HD: On Tuesday ,Thursday and Saturday. - Renal consult for hemodialysis as needed.  4) Hypertension: Blood pressure stable. - Will give metoprolol IV for rate control.  5) Hypothyroidism: Post ablative- for Graves' disease. - Continue home Synthroid.  Signed: PATEL,RAVI 04/20/2011, 3:04 PM As above, patient seen and examined. Briefly 76 year old female with past medical history of paroxysmal atrial fibrillation, end-stage renal disease hemodialysis dependent, hypertension, hypothyroid with atrial flutter. Patient is status post cardiac catheterization in July of 2012. There was minimal nonobstructive coronary disease and normal  LV function. Echocardiogram in March of 2012 showed normal LV function, mild left atrial enlargement, moderate mitral regurgitation and trace aortic insufficiency. The patient has been intolerant to medications including Cardizem. Her amiodarone was recently discontinued as it was felt to potentially be causing visual toxicity. Patient typically does not have dyspnea on exertion, orthopnea, PND, pedal edema, syncope or exertional chest pain. She developed recurrent palpitations this morning similar to her previous symptoms. There is associated chest pain. She presents to the emergency room and was noted to be in atrial flutter. Cardiology asked to evaluate. Electrocardiogram shows atrial flutter with a rapid ventricular response and RV conduction delay. Plan admit to step down. Will control rate with IV metoprolol. She apparently has been intolerant to Cardizem in the past. Continue Coumadin. Repeat echocardiogram. Check TSH. She most likely will need evaluation by Dr. Johney Frame for consideration of atrial flutter and fibrillation ablation. She has been intolerant to medications in the past and is now having recurrent atrial arrhythmias. Olga Millers

## 2011-04-20 NOTE — ED Notes (Signed)
Patient is resting comfortably. 

## 2011-04-20 NOTE — ED Notes (Signed)
EKG given to linker, md by ida, nt

## 2011-04-20 NOTE — ED Provider Notes (Signed)
History     CSN: [id number]  Arrival date & time 04/20/11  1103   First MD Initiated Contact with Patient 04/20/11 1149      Chief Complaint  Patient presents with  . Atrial Fibrillation    (Consider location/radiation/quality/duration/timing/severity/associated sxs/prior treatment) HPI Patient presents with the report of feeling a rapid heart rate. She was at dialysis this morning and felt that her heart was fluttering. Her heart rate was checked and was in the 130s treatment. She denies shortness of breath or chest pain or leg swelling. She does take Coumadin for a history of atrial fibrillation. She's had no fevers or coughs vomiting or diarrhea recently. She's been drinking liquids normally. She is on metoprolol for rate control and states that she has not missed any doses. There's been no recent changes in her medications. There no other associated systemic symptoms. There no alleviating or modifying factors.. She was then transferred from dialysis to the emergency department for further evaluation. She received 2 hours of her 4 hour dialysis  Past Medical History  Diagnosis Date  . ESRD (end stage renal disease)     HD on Tues, Thurs, Sat // Likely secondary to nephrosclerosis from HTN  . HTN (hypertension)   . HLD (hyperlipidemia)   . Cholelithiases     S/P biliary colic cholecystostomy, but not cholecystectomy secondary to omental adhesions  . Anxiety   . Anemia     BL Hgb 8-9. Likely AOCD in setting of ESRD with iron 48, ferritin  746 (03/2010)  . PAF (paroxysmal atrial fibrillation)     On chronic coumadin therapy // Followed by Dr. Excell Seltzer (cardiology)  . Non-ST elevation MI (NSTEMI)     Noninvasive evaluation with Myoview negative for ischemia.  // 2-D echo (05/2010) LVEF 65-70%. Grade 1 diastolic dysfunction.  Peak PA pressure 52.  Marland Kitchen Hyperthyroidism DX: 07/2010    Likely Grave's Disease. // On diagnosis, TSH < 0.08, free T4 1.94, free T3 5.3 . //  Thyroid US (07/17/2010) -  nonspecific diffuse heterogenous thyroid gland. //  Thyrotropin receptor antibody neg (07/17/2010)  . Atrial flutter     Past Surgical History  Procedure Date  . Other surgical history 02/2009    Cholecystostomy secondary to biliary colic - NO cholecystectomy secondary to omental adhesions - by Dr. Bertram Savin  . Gallbladder surgery   . Breast lumpectomy     right  . Temple artery biopsy 12/14/10    Family History  Problem Relation Age of Onset  . Stroke    . Heart attack      History  Substance Use Topics  . Smoking status: Never Smoker   . Smokeless tobacco: Not on file  . Alcohol Use: No    OB History    Grav Para Term Preterm Abortions TAB SAB Ect Mult Living                  Review of Systems ROS reviewed and otherwise negative except for mentioned in HPI  Allergies  Cardizem; Codeine; Iron; Rena-vite; Vancomycin; Venofer; and Latex  Home Medications   No current outpatient prescriptions on file.  BP 156/52  Pulse 54  Temp(Src) 98.1 F (36.7 C) (Oral)  Resp 17  Ht 5\' 3"  (1.6 m)  Wt 121 lb 14.6 oz (55.3 kg)  BMI 21.60 kg/m2  SpO2 98% Vitals reviewed Physical Exam Physical Examination: General appearance - alert, well appearing, and in no distress Mental status - alert, oriented to person, place, and time  Eyes - pupils equal and reactive, no scleral icterus Mouth - mucous membranes moist, pharynx normal without lesions Chest - clear to auscultation, no wheezes, rales or rhonchi, symmetric air entry Heart - normal rate, regular rhythm, normal S1, S2, no murmurs, rubs, clicks or gallops Abdomen - soft, nontender, nondistended, no masses or organomegaly, NABS Musculoskeletal - no joint tenderness, deformity or swelling Extremities - peripheral pulses normal, mild bilateral pedal edema, no clubbing or cyanosis Skin - normal coloration and turgor, no rashes  ED Course  Procedures (including critical care time)  CRITICAL CARE Performed by:  Ethelda Chick   Total critical care time: 40  Critical care time was exclusive of separately billable procedures and treating other patients.  Critical care was necessary to treat or prevent imminent or life-threatening deterioration.  Critical care was time spent personally by me on the following activities: development of treatment plan with patient and/or surrogate as well as nursing, discussions with consultants, evaluation of patient's response to treatment, examination of patient, obtaining history from patient or surrogate, ordering and performing treatments and interventions, ordering and review of laboratory studies, ordering and review of radiographic studies, pulse oximetry and re-evaluation of patient's condition.   Date: 04/20/2011  Rate: 131  Rhythm: atrial fibrillation  QRS Axis: normal  Intervals: indeterminate  ST/T Wave abnormalities: normal  Conduction Disutrbances:indeterminate  Narrative Interpretation:   Old EKG Reviewed: changes noted, rate faster compared to EKG of 09/20/10    Labs Reviewed  CBC - Abnormal; Notable for the following:    WBC 3.0 (*)    RBC 3.71 (*)    Hemoglobin 11.0 (*)    HCT 34.9 (*)    RDW 17.6 (*)    All other components within normal limits  COMPREHENSIVE METABOLIC PANEL - Abnormal; Notable for the following:    CO2 34 (*)    Creatinine, Ser 3.07 (*)    Albumin 3.4 (*)    GFR calc non Af Amer 14 (*)    GFR calc Af Amer 16 (*)    All other components within normal limits  URINALYSIS, ROUTINE W REFLEX MICROSCOPIC - Abnormal; Notable for the following:    pH 8.5 (*)    Protein, ur 100 (*)    Leukocytes, UA TRACE (*)    All other components within normal limits  PROTIME-INR - Abnormal; Notable for the following:    Prothrombin Time 19.4 (*)    INR 1.61 (*)    All other components within normal limits  URINE MICROSCOPIC-ADD ON - Abnormal; Notable for the following:    Squamous Epithelial / LPF FEW (*)    All other components  within normal limits  TSH - Abnormal; Notable for the following:    TSH 0.218 (*)    All other components within normal limits  PROTIME-INR - Abnormal; Notable for the following:    Prothrombin Time 19.0 (*)    INR 1.56 (*)    All other components within normal limits  BASIC METABOLIC PANEL - Abnormal; Notable for the following:    Glucose, Bld 109 (*)    BUN 27 (*)    Creatinine, Ser 4.49 (*)    GFR calc non Af Amer 9 (*)    GFR calc Af Amer 10 (*)    All other components within normal limits  CBC - Abnormal; Notable for the following:    WBC 3.6 (*)    RBC 3.59 (*)    Hemoglobin 10.6 (*)    HCT 34.2 (*)  RDW 18.2 (*)    All other components within normal limits  CARDIAC PANEL(CRET KIN+CKTOT+MB+TROPI)  CARDIAC PANEL(CRET KIN+CKTOT+MB+TROPI)  CARDIAC PANEL(CRET KIN+CKTOT+MB+TROPI)  MRSA PCR SCREENING   Dg Chest 2 View  04/20/2011  *RADIOLOGY REPORT*  Clinical Data: Rapid heart rate.  Dialysis patient.  CHEST - 2 VIEW  Comparison: Multiple priors, most recently 09/20/2010.  Findings: Lung volumes are normal.  No consolidative airspace disease.  No pleural effusions.  No pneumothorax.  No pulmonary nodule or mass noted.  Pulmonary vasculature and the cardiomediastinal silhouette are within normal limits. Atherosclerotic calcifications in the aortic arch.  A vascular stent present in the left subclavian region.  IMPRESSION:  1.  No radiographic evidence of acute cardiopulmonary disease. 2.  Atherosclerosis.  Original Report Authenticated By: Florencia Reasons, M.D.     1. Atrial flutter       MDM  Patient presenting from dialysis with rapid heart rate. She is found to be in rapid atrial fibrillation. Patient was placed on a monitor IV access obtained. Do to her allergy to diltiazem and she was started on IV Lopressor. This did not control her rate very significantly. Cardiology consult was obtained in the ED and patient will need admission for further evaluation and management.            Ethelda Chick, MD 04/21/11 [phone]

## 2011-04-21 ENCOUNTER — Encounter (HOSPITAL_COMMUNITY): Payer: Self-pay | Admitting: Physician Assistant

## 2011-04-21 DIAGNOSIS — I4892 Unspecified atrial flutter: Secondary | ICD-10-CM

## 2011-04-21 DIAGNOSIS — Z7901 Long term (current) use of anticoagulants: Secondary | ICD-10-CM | POA: Diagnosis present

## 2011-04-21 DIAGNOSIS — I369 Nonrheumatic tricuspid valve disorder, unspecified: Secondary | ICD-10-CM

## 2011-04-21 LAB — CBC
HCT: 34.2 % — ABNORMAL LOW (ref 36.0–46.0)
Hemoglobin: 10.6 g/dL — ABNORMAL LOW (ref 12.0–15.0)
RBC: 3.59 MIL/uL — ABNORMAL LOW (ref 3.87–5.11)
WBC: 3.6 10*3/uL — ABNORMAL LOW (ref 4.0–10.5)

## 2011-04-21 LAB — BASIC METABOLIC PANEL
BUN: 27 mg/dL — ABNORMAL HIGH (ref 6–23)
Chloride: 102 mEq/L (ref 96–112)
GFR calc Af Amer: 10 mL/min — ABNORMAL LOW (ref >90)
GFR calc non Af Amer: 9 mL/min — ABNORMAL LOW (ref >90)
Potassium: 4.2 mEq/L (ref 3.5–5.1)
Sodium: 143 mEq/L (ref 135–145)

## 2011-04-21 LAB — PROTIME-INR
INR: 1.56 — ABNORMAL HIGH (ref 0.00–1.49)
Prothrombin Time: 19 seconds — ABNORMAL HIGH (ref 11.6–15.2)

## 2011-04-21 MED ORDER — LEVOTHYROXINE SODIUM 112 MCG PO TABS: 112.0000 ug | ORAL_TABLET | Freq: Every morning | ORAL | Status: DC

## 2011-04-21 MED ORDER — METOPROLOL TARTRATE 25 MG PO TABS: ORAL_TABLET | ORAL | Status: DC

## 2011-04-21 MED ORDER — WARFARIN SODIUM 7.5 MG PO TABS
7.5000 mg | ORAL_TABLET | Freq: Once | ORAL | Status: DC
  Filled 2011-04-21: qty 1

## 2011-04-21 MED ORDER — ENOXAPARIN SODIUM 60 MG/0.6ML ~~LOC~~ SOLN
1.0000 mg/kg | SUBCUTANEOUS | Status: DC
  Administered 2011-04-21: 55 mg via SUBCUTANEOUS
  Filled 2011-04-21: qty 0.6

## 2011-04-21 NOTE — Discharge Summary (Signed)
CARDIOLOGY DISCHARGE SUMMARY   Patient ID: [name] MRN: [id number] DOB/AGE: 1935-08-21 76 y.o.  Admit date: 04/20/2011 Discharge date: 04/21/2011  Primary Discharge Diagnosis:  Atrial flutter Secondary Discharge Diagnosis:  Patient Active Problem List  Diagnoses  . UNSPECIFIED VITAMIN D DEFICIENCY  . HYPERLIPIDEMIA  . ANEMIA-NOS  . ANXIETY  . HYPERTENSION, KIDNEY DISEASE, UNCONTROLLED  . ATRIAL FIBRILLATION  . ACUTE ON CHRONIC DIASTOLIC HEART FAILURE  . CHOLELITHIASIS  . CHEST PAIN, ATYPICAL  . ADVEF, DRUG/MED/BIOL SUBST, DRUG ALLERGY NEC  . Encounter for long-term (current) use of anticoagulants  . Hyperthyroidism  . Gastritis  . End stage renal disease on dialysis  . Headache  . Dressing change/suture removal  . Atrial flutter  . Anticoagulant long-term use   Procedures: 2-D echocardiogram  Hospital Course: [name] is a 76 year old female with a history of paroxysmal atrial fibrillation. She had chest pain and palpitations. She came to the emergency room where she was in rapid atrial flutter. She was admitted for further evaluation and treatment.  Her cardiac enzymes were negative for MI. She had dialysis on the day of admission so the renal team was not consulted. She spontaneously converted to sinus rhythm and maintained sinus rhythm during her hospital stay. Her INR was subtherapeutic so she was started on Lovenox. It was not felt that she needed complete bridging to a therapeutic Coumadin level but this should be followed closely. She was anemic but this is not new and this will be followed by primary care plus the renal team.  Once she converted to sinus rhythm, her chest pain resolved. She was continued on her home dose of beta blocker. Her TSH was low and it was noted that her TSH has been low the last several times it was checked. Her Synthroid was decreased and she is to followup with primary care.  On 04/21/2011, she was seen by Dr. Excell Seltzer. She was  maintaining sinus rhythm. It was felt that she should see EP regarding possible ablation but this could be done as an outpatient. She is considered stable for discharge, in improved condition, to followup as an outpatient. A 2-D echocardiogram was performed and the results are pending at the time of discharge.  Labs:  Lab Results  Component Value Date   WBC 3.6* 04/21/2011   HGB 10.6* 04/21/2011   HCT 34.2* 04/21/2011   MCV 95.3 04/21/2011   PLT 178 04/21/2011    Lab 04/21/11 0420 04/20/11 1200  NA 143 --  K 4.2 --  CL 102 --  CO2 32 --  BUN 27* --  CREATININE 4.49* --  CALCIUM 9.6 --  PROT -- 6.8  BILITOT -- 0.4  ALKPHOS -- 98  ALT -- 24  AST -- 15  GLUCOSE 109* --    Basename 04/20/11 2224 04/20/11 1450 04/20/11 1205  CKTOTAL 36 35 34  CKMB 2.2 2.4 1.9  CKMBINDEX -- -- --  TROPONINI <0.30 <0.30 <0.30    Basename 04/21/11 0420  INR 1.56*      Radiology: Dg Chest 2 View 04/20/2011  *RADIOLOGY REPORT*  Clinical Data: Rapid heart rate.  Dialysis patient.  CHEST - 2 VIEW  Comparison: Multiple priors, most recently 09/20/2010.  Findings: Lung volumes are normal.  No consolidative airspace disease.  No pleural effusions.  No pneumothorax.  No pulmonary nodule or mass noted.  Pulmonary vasculature and the cardiomediastinal silhouette are within normal limits. Atherosclerotic calcifications in the aortic arch.  A vascular stent present in the left subclavian  region.  IMPRESSION:  1.  No radiographic evidence of acute cardiopulmonary disease. 2.  Atherosclerosis.  Original Report Authenticated By: Florencia Reasons, M.D.   EKG: 20-Apr-2011 17:51:00 SINUS RHYTHM ~ normal P axis, V-rate 50- 99  Echo: pending  FOLLOW UP PLANS AND APPOINTMENTS Discharge Orders    Future Appointments: Provider: Department: Dept Phone: Center:   04/26/2011 3:00 PM Raul Del, RN [facility] [phone] None   05/22/2011 9:30 AM Gardiner Rhyme, MD [facility] [phone] [id number]      Future Orders Please Complete By Expires   Diet renal 60/70-04-14-1198      Increase activity slowly        Allergies  Allergen Reactions  . Cardizem (Diltiazem Hcl) Hives  . Codeine Other (See Comments)    dizziness  . Iron     ?? Almost died.  . Rena-Vite (Super B-Complex-Vit C-Fa) Other (See Comments)    Unknown reaction  . Vancomycin Other (See Comments)    dizziness  . Venofer (Iron Sucrose (Iron Oxide Saccharated)) Itching  . Latex Itching and Rash   Medication List  As of 04/21/2011  2:06 PM   TAKE these medications         calcium acetate 667 MG capsule   Commonly known as: PHOSLO   Take 667 mg by mouth 3 (three) times daily before meals.      levothyroxine 112 MCG tablet   Commonly known as: SYNTHROID, LEVOTHROID   Take 1 tablet (112 mcg total) by mouth every morning. Take on an empty stomach      metoprolol tartrate 25 MG tablet   Commonly known as: LOPRESSOR   Take 25 mg by mouth See admin instructions. Take 1 tab on Mon, Wed, Fri & Sat (non dialysis days).      metoprolol tartrate 25 MG tablet   Commonly known as: LOPRESSOR   Take one by mouth as needed for pulse greater than 120      ranitidine 150 MG tablet   Commonly known as: ZANTAC   Take 150 mg by mouth at bedtime.      warfarin 5 MG tablet   Commonly known as: COUMADIN   Take 5 mg by mouth every evening.           Follow-up Information    Follow up with ROGERS,STEWART, MD in 2 weeks. (As needed)       Follow up with Tonny Bollman, MD. (As needed)    Contact information:   [address], [address] [place] [address] [phone]       Follow up with Hillis Range, MD. (March 11, 9:30 (They will call you if earlier appointment  available))    Contact information:   [address], [address] [place] [address] [phone]          BRING ALL MEDICATIONS WITH YOU TO FOLLOW UP APPOINTMENTS  Time spent with patient to include  physician time: 40 min Signed: Theodore Demark 04/21/2011, 2:06 PM Co-Sign MD

## 2011-04-21 NOTE — Progress Notes (Signed)
Patient Name: [name] Date of Encounter: 04/21/2011  Active Problems:  CHEST PAIN, ATYPICAL  Atrial flutter  Anticoagulant long-term use   SUBJECTIVE: Pt denies CP/SOB, no palpitations. Feels well, wants to go home. She has limited [place].  OBJECTIVE Filed Vitals:   04/21/11 0300 04/21/11 0400 04/21/11 0500 04/21/11 0729  BP: 152/47 145/46 130/39 156/52  Pulse: 59 58 55 54  Temp:  98.3 F (36.8 C)  98.1 F (36.7 C)  TempSrc:  Oral  Oral  Resp: 11 12 17 17   Height:      Weight:      SpO2: 95% 97% 95% 98%    Intake/Output Summary (Last 24 hours) at 04/21/11 0943 Last data filed at 04/21/11 0800  Gross per 24 hour  Intake    463 ml  Output      0 ml  Net    463 ml   Weight change:  Wt Readings from Last 3 Encounters:  04/21/11 121 lb 14.6 oz (55.3 kg)  04/14/11 116 lb 14.4 oz (53.025 kg)  01/09/11 108 lb (48.988 kg)    PHYSICAL EXAM  General: Well developed, elderly female, in no acute distress. Head: Normocephalic, atraumatic.  Neck: Supple without bruits, JVD no significant elevation. Lungs:  Resp regular and unlabored, some rales in bases. Heart: RRR, S1, S2, no S3, S4, 2/6 murmur. Abdomen: Soft, non-tender, non-distended, BS + x 4.  Extremities: No clubbing, cyanosis, no edema.  Neuro: Alert and oriented X 3. Moves all extremities spontaneously. Psych: Normal affect.  LABS:  CBC: Basename 04/21/11 0420 04/20/11 1200  WBC 3.6* 3.0*  NEUTROABS -- --  HGB 10.6* 11.0*  HCT 34.2* 34.9*  MCV 95.3 94.1  PLT 178 169   INR: Basename 04/21/11 0420  INR 1.56*   Basic Metabolic Panel: Basename 04/21/11 0420 04/20/11 1200  NA 143 141  K 4.2 3.8  CL 102 99  CO2 32 34*  GLUCOSE 109* 93  BUN 27* 19  CREATININE 4.49* 3.07*  CALCIUM 9.6 9.4  MG -- --  PHOS -- --   Liver Function Tests: Basename 04/20/11 1200  AST 15  ALT 24  ALKPHOS 98  BILITOT 0.4  PROT 6.8  ALBUMIN 3.4*   Lab Results  Component Value Date   TSH 0.218* 04/20/2011     Cardiac Enzymes: Basename 04/20/11 2224 04/20/11 1450 04/20/11 1205  CKTOTAL 36 35 34  CKMB 2.2 2.4 1.9  CKMBINDEX -- -- --  TROPONINI <0.30 <0.30 <0.30   Thyroid Function Tests: Basename 04/20/11 1556  TSH 0.218*  T4TOTAL --  T3FREE --  THYROIDAB --   TELE:    SR, sinus brady overnight, converted prior to arrival on the floor  Radiology/Studies: Dg Chest 2 View 04/20/2011  *RADIOLOGY REPORT*  Clinical Data: Rapid heart rate.  Dialysis patient.  CHEST - 2 VIEW  Comparison: Multiple priors, most recently 09/20/2010.  Findings: Lung volumes are normal.  No consolidative airspace disease.  No pleural effusions.  No pneumothorax.  No pulmonary nodule or mass noted.  Pulmonary vasculature and the cardiomediastinal silhouette are within normal limits. Atherosclerotic calcifications in the aortic arch.  A vascular stent present in the left subclavian region.  IMPRESSION:  1.  No radiographic evidence of acute cardiopulmonary disease. 2.  Atherosclerosis.  Original Report Authenticated By: Florencia Reasons, M.D.   Allergies  Allergen Reactions  . Cardizem (Diltiazem Hcl) Hives  . Codeine Other (See Comments)    dizziness  . Iron     ??  Almost died.  . Rena-Vite (Super B-Complex-Vit C-Fa) Other (See Comments)    Unknown reaction  . Vancomycin Other (See Comments)    dizziness  . Venofer (Iron Sucrose (Iron Oxide Saccharated)) Itching  . Latex Itching and Rash    Current Medications:    . calcium carbonate  1 tablet Oral TID AC  . famotidine  20 mg Oral Daily  . levothyroxine  137 mcg Oral QAC breakfast  . metoprolol  5 mg Intravenous Once  . metoprolol  5 mg Intravenous Q6H  . metoprolol  5 mg Intravenous Once  . sodium chloride  3 mL Intravenous Q12H  . warfarin  5 mg Oral q1800  . DISCONTD: Calcium Acetate  1 tablet Oral TID AC   ASSESSMENT AND PLAN: Active Problems:  1. CHEST PAIN, ATYPICAL - enzymes negative for MI, possibly secondary to arrhythmia, cont to follow,  echo pending  2.  Atrial flutter - scheduled IV Lopressor, spont converted to SR last pm in ER. Needs EP to see, ?now or as OP.  3. Anticoagulant long-term use - coumadin sub-therapeutic, give lovenox  4. ESRD on HD - contact renal team to manage if no d/c.  5. Hypothyroid - on Synthroid 137 mcg, TSH has been low last few times checked, consider decrease dose to 125 mcg.   Plan: d/c when medically stable, MD advise further plan.  SignedTheodore Demark , PA-C 9:43 AM 04/21/2011  Patient seen, examined. Available data reviewed. Agree with findings, assessment, and plan as outlined by Theodore Demark, PA-C. Pt independently evaluated. Agree that she is ready for discharge. Recommend follow-up office visit with Dr Johney Frame for further management strategies for her atrial fibrillation.   Tonny Bollman, M.D. 04/21/2011 1:37 PM

## 2011-04-21 NOTE — Progress Notes (Signed)
UR Completed. Simmons, Ladajah Soltys F [phone]  

## 2011-04-21 NOTE — Progress Notes (Signed)
ANTICOAGULATION CONSULT NOTE - Initial Consult  Pharmacy Consult for Lovenox and Warfarin Indication: A. Flutter  Allergies  Allergen Reactions  . Cardizem (Diltiazem Hcl) Hives  . Codeine Other (See Comments)    dizziness  . Iron     ?? Almost died.  . Rena-Vite (Super B-Complex-Vit C-Fa) Other (See Comments)    Unknown reaction  . Vancomycin Other (See Comments)    dizziness  . Venofer (Iron Sucrose (Iron Oxide Saccharated)) Itching  . Latex Itching and Rash    Patient Measurements: Height: 5\' 3"  (160 cm) Weight: 121 lb 14.6 oz (55.3 kg) IBW/kg (Calculated) : 52.4    Vital Signs: Temp: 98.1 F (36.7 C) (02/08 0729) Temp src: Oral (02/08 0729) BP: 156/52 mmHg (02/08 0729) Pulse Rate: 54  (02/08 0729)  Labs:  Basename 04/21/11 0420 04/20/11 2224 04/20/11 1450 04/20/11 1205 04/20/11 1200  HGB 10.6* -- -- -- 11.0*  HCT 34.2* -- -- -- 34.9*  PLT 178 -- -- -- 169  APTT -- -- -- -- --  LABPROT 19.0* -- -- -- 19.4*  INR 1.56* -- -- -- 1.61*  HEPARINUNFRC -- -- -- -- --  CREATININE 4.49* -- -- -- 3.07*  CKTOTAL -- 36 35 34 --  CKMB -- 2.2 2.4 1.9 --  TROPONINI -- <0.30 <0.30 <0.30 --    Medical History: Past Medical History  Diagnosis Date  . ESRD (end stage renal disease)     HD on Tues, Thurs, Sat // Likely secondary to nephrosclerosis from HTN  . HTN (hypertension)   . HLD (hyperlipidemia)   . Cholelithiases     S/P biliary colic cholecystostomy, but not cholecystectomy secondary to omental adhesions  . Anxiety   . Anemia     BL Hgb 8-9. Likely AOCD in setting of ESRD with iron 48, ferritin  746 (03/2010)  . PAF (paroxysmal atrial fibrillation)     On chronic coumadin therapy // Followed by Dr. Excell Seltzer (cardiology)  . Non-ST elevation MI (NSTEMI)     Noninvasive evaluation with Myoview negative for ischemia.  // 2-D echo (05/2010) LVEF 65-70%. Grade 1 diastolic dysfunction.  Peak PA pressure 52.  Marland Kitchen Hyperthyroidism DX: 07/2010    Likely Grave's Disease. //  On diagnosis, TSH < 0.08, free T4 1.94, free T3 5.3 . //  Thyroid US (07/17/2010) - nonspecific diffuse heterogenous thyroid gland. //  Thyrotropin receptor antibody neg (07/17/2010)  . Atrial flutter   . Anticoagulant long-term use     Medications:  Prescriptions prior to admission  Medication Sig Dispense Refill  . calcium acetate (PHOSLO) 667 MG capsule Take 667 mg by mouth 3 (three) times daily before meals.      Marland Kitchen levothyroxine (SYNTHROID, LEVOTHROID) 125 MCG tablet Take 125 mcg by mouth every morning. Take on an empty stomach      . metoprolol tartrate (LOPRESSOR) 25 MG tablet Take 25 mg by mouth See admin instructions. Take 1 tab on Mon, Wed, Fri & Sat (non dialysis days).      . ranitidine (ZANTAC) 150 MG tablet Take 150 mg by mouth at bedtime.      Marland Kitchen warfarin (COUMADIN) 5 MG tablet Take 5 mg by mouth every evening.        Assessment: 76 year old woman on chronic warfarin for A.flutter.  INR subtherapeutic today.  Home warfarin dose is 5mg  daily.  Lovenox ordered to bridge until INR therapeutic. Goal of Therapy:  INR 2-3   Plan:  Lovenox 1mg /kg daily - 55mg  sq daily.  Will give 7.5mg  warfarin today since INR low on 5mg  daily.  Daily INR.  Mickeal Skinner 04/21/2011,11:48 AM

## 2011-04-21 NOTE — Progress Notes (Addendum)
Pt. Discharged to home with husband, [name]. Pt. Stable, and anxious to leave. Belongings with husband. PIV removed.  Discharge/medication instructions given to patient and husband. Both verbalized understanding.  [name]

## 2011-04-21 NOTE — Progress Notes (Signed)
*  PRELIMINARY RESULTS* Echocardiogram 2D Echocardiogram has been performed.  [name] 04/21/2011, 10:31 AM

## 2011-04-23 NOTE — Discharge Summary (Signed)
Agree as outlined. Please see my progress note this same date.

## 2011-04-26 ENCOUNTER — Ambulatory Visit (INDEPENDENT_AMBULATORY_CARE_PROVIDER_SITE_OTHER): Payer: Medicaid Other | Admitting: *Deleted

## 2011-04-26 DIAGNOSIS — I4891 Unspecified atrial fibrillation: Secondary | ICD-10-CM

## 2011-04-26 DIAGNOSIS — Z7901 Long term (current) use of anticoagulants: Secondary | ICD-10-CM

## 2011-04-26 LAB — POCT INR: INR: 1.1

## 2011-05-03 ENCOUNTER — Other Ambulatory Visit: Payer: Self-pay | Admitting: *Deleted

## 2011-05-03 ENCOUNTER — Ambulatory Visit (INDEPENDENT_AMBULATORY_CARE_PROVIDER_SITE_OTHER): Payer: Medicaid Other | Admitting: Pharmacist

## 2011-05-03 DIAGNOSIS — Z7901 Long term (current) use of anticoagulants: Secondary | ICD-10-CM

## 2011-05-03 DIAGNOSIS — I4891 Unspecified atrial fibrillation: Secondary | ICD-10-CM

## 2011-05-03 LAB — POCT INR: INR: 2

## 2011-05-03 MED ORDER — AMIODARONE HCL 100 MG PO TABS: ORAL_TABLET | ORAL | Status: DC

## 2011-05-08 ENCOUNTER — Encounter: Payer: Self-pay | Admitting: Internal Medicine

## 2011-05-17 ENCOUNTER — Ambulatory Visit (INDEPENDENT_AMBULATORY_CARE_PROVIDER_SITE_OTHER): Payer: Medicaid Other | Admitting: *Deleted

## 2011-05-17 DIAGNOSIS — I4891 Unspecified atrial fibrillation: Secondary | ICD-10-CM

## 2011-05-17 DIAGNOSIS — Z7901 Long term (current) use of anticoagulants: Secondary | ICD-10-CM

## 2011-05-17 LAB — POCT INR: INR: 1.7

## 2011-05-18 ENCOUNTER — Other Ambulatory Visit: Payer: Self-pay | Admitting: Cardiovascular Disease

## 2011-05-18 MED ORDER — WARFARIN SODIUM 5 MG PO TABS: 5.0000 mg | ORAL_TABLET | ORAL | Status: DC

## 2011-05-18 NOTE — Telephone Encounter (Signed)
Warfarin 5 mg, [facility] [address]

## 2011-05-19 ENCOUNTER — Telehealth: Payer: Self-pay | Admitting: Cardiovascular Disease

## 2011-05-19 MED ORDER — WARFARIN SODIUM 5 MG PO TABS: 5.0000 mg | ORAL_TABLET | ORAL | Status: DC

## 2011-05-19 NOTE — Telephone Encounter (Signed)
Pt is requesting that her warfarin be sent to [facility] [address] instead of [facility].

## 2011-05-19 NOTE — Telephone Encounter (Signed)
Pt doesn't want to go to [facility] he wanted to go to [facility] please resend

## 2011-05-22 ENCOUNTER — Encounter: Payer: Self-pay | Admitting: Internal Medicine

## 2011-05-22 ENCOUNTER — Ambulatory Visit (INDEPENDENT_AMBULATORY_CARE_PROVIDER_SITE_OTHER): Payer: Self-pay | Admitting: Internal Medicine

## 2011-05-22 VITALS — BP 196/63 | HR 67 | Resp 18 | Ht 63.0 in | Wt 120.8 lb

## 2011-05-22 DIAGNOSIS — I4891 Unspecified atrial fibrillation: Secondary | ICD-10-CM

## 2011-05-22 DIAGNOSIS — Z7901 Long term (current) use of anticoagulants: Secondary | ICD-10-CM

## 2011-05-22 DIAGNOSIS — I4892 Unspecified atrial flutter: Secondary | ICD-10-CM

## 2011-05-22 NOTE — Assessment & Plan Note (Signed)
Continue coumadin long term for stroke prevention.

## 2011-05-22 NOTE — Assessment & Plan Note (Signed)
The patient presents today for EP consultation regarding atrial fibrillation.  She presented recently 2/13 with a diagnosis of atrial flutter.  I have reviewed ekgs from epic which appear more consistent with atrial fibrillation than atrial flutter. She has ESRD and therefore our medical therapy for afib is limited.  She reports that her amiodarone was stopped due to concerns for occular toxicity but subsequently feels that Dr Lucious Groves has decided that amiodarone was not responsible. Therapeutic strategies for afib including medicine and ablation were discussed in detail with the patient today. Risk, benefits, and alternatives to EP study and radiofrequency ablation for afib were also discussed in detail today.  She is very clear in her decision to decline catheter ablation at this time.  I think that it is important to clarify her occular situation.  If Dr Lucious Groves feels that her occular problems were not due to amiodarone, then it is probably reasonable to restart amiodarone and follow her clinically.  If however, he does not feel that she is a candidate for amiodarone, then we could consider flecainide.  She really does not have any other good AAD options with her renal failure.  I have asked Dr Earmon Phoenix nurse Leotis Shames to work with Dr Lucious Groves office regarding their opinion on the patient restarting amiodarone. I will defer further medical therapy to Drs Excell Seltzer and Ladona Ridgel who know the patient well.  IF she decides to pursue catheter ablation in the future, then I would be happy to see her again at that time.

## 2011-05-22 NOTE — Progress Notes (Signed)
Primary Care Physician: Ulyess Mort, MD, MD Referring Physician:  Dr Cipriano Bunker is a 76 y.o. female with a h/o ESRD on HD, HTN, and  paroxysmal atrial fibrillation and possibly atrial flutter who presents today for EP consultation.  She reports initially being told that she had atrial fibrillation more than 5 years ago.  She has previously seen Dr Ladona Ridgel and was placed on amiodarone.  She reports that her afib was initially well controlled with amiodarone.  Unfortunately, she developed decreased vision and her amiodarone was stopped by Dr Excell Seltzer.  She reports increasing frequency and duration of atrial arrhythmias since that time.  She presently reports symptoms of tachypalpitations and fatigued every 2 weeks, lasting 4-6 hours.  She is unware of triggers or precipitants for afib.  She has begun taking taking amiodarone as needed for afib and finds that it will terminate episodes.  She reports that she has seen Dr Dione Booze with [facility] who told her that amiodarone was not responsible for her visual changes.  She reports that she was told that she had a stroke which caused her visual difficulty.  She has recently had bilateral cataract surgery and vision continues to improve.  Most recently, she presented to [facility]11 with tachypalpitations.  She was found to have afib vs atrial flutter and treated with rate control.  Her arrhythmia spontaneously resolved.  She has done well since that time.  Today, she denies symptoms of chest pain, shortness of breath, orthopnea, PND, lower extremity edema, dizziness, presyncope, syncope, or neurologic sequela. The patient is tolerating medications without difficulties and is otherwise without complaint today.   Past Medical History  Diagnosis Date  . ESRD (end stage renal disease)     HD for 1 year, HD on Tues, Thurs, Sat // Likely secondary to nephrosclerosis from HTN  . HTN (hypertension)   . HLD (hyperlipidemia)   .  Cholelithiases     S/P biliary colic cholecystostomy, but not cholecystectomy secondary to omental adhesions  . Anxiety   . Anemia     BL Hgb 8-9. Likely AOCD in setting of ESRD with iron 48, ferritin  746 (03/2010)  . PAF (paroxysmal atrial fibrillation)     On chronic coumadin therapy // Followed by Dr. Excell Seltzer (cardiology)  . Non-ST elevation MI (NSTEMI)     Noninvasive evaluation with Myoview negative for ischemia.  // 2-D echo (05/2010) LVEF 65-70%. Grade 1 diastolic dysfunction.  Peak PA pressure 52.  Marland Kitchen Hyperthyroidism DX: 07/2010    Likely Grave's Disease. // On diagnosis, TSH < 0.08, free T4 1.94, free T3 5.3 . //  Thyroid US (07/17/2010) - nonspecific diffuse heterogenous thyroid gland. //  Thyrotropin receptor antibody neg (07/17/2010)  . Atrial flutter   . Anticoagulant long-term use    Past Surgical History  Procedure Date  . Other surgical history 02/2009    Cholecystostomy secondary to biliary colic - NO cholecystectomy secondary to omental adhesions - by Dr. Bertram Savin  . Gallbladder surgery   . Breast lumpectomy     right  . [place] artery biopsy 12/14/10    Current Outpatient Prescriptions  Medication Sig Dispense Refill  . amiodarone (PACERONE) 100 MG tablet Take 1 tab daily as needed for increased heart rate  30 tablet  0  . calcium acetate (PHOSLO) 667 MG capsule Take 667 mg by mouth 3 (three) times daily before meals.      Marland Kitchen levothyroxine (SYNTHROID, LEVOTHROID) 112 MCG tablet Take 1 tablet (112  mcg total) by mouth every morning. Take on an empty stomach  30 tablet  11  . metoprolol tartrate (LOPRESSOR) 25 MG tablet Take 25 mg by mouth See admin instructions. Take 1 tab on Mon, Wed, Fri & Sat (non dialysis days).      . metoprolol tartrate (LOPRESSOR) 25 MG tablet Take one by mouth as needed for pulse greater than 120  30 tablet  11  . ranitidine (ZANTAC) 150 MG tablet Take 150 mg by mouth at bedtime.      Marland Kitchen warfarin (COUMADIN) 5 MG tablet Take 1 tablet (5 mg total)  by mouth as directed.  40 tablet  3    Allergies  Allergen Reactions  . Cardizem (Diltiazem Hcl) Hives  . Codeine Other (See Comments)    dizziness  . Iron     ?? Almost died.  . Rena-Vite (Super B-Complex-Vit C-Fa) Other (See Comments)    Unknown reaction  . Vancomycin Other (See Comments)    dizziness  . Venofer (Iron Sucrose (Iron Oxide Saccharated)) Itching  . Latex Itching and Rash    History   Social History  . Marital Status: Married    Spouse Name: N/A    Number of Children: 0  . Years of Education: 75 th grad   Occupational History  .     Social History Main Topics  . Smoking status: Never Smoker   . Smokeless tobacco: Not on file  . Alcohol Use: No  . Drug Use: No  . Sexually Active: Not on file   Other Topics Concern  . 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  Originally from [place]    Family History  Problem Relation Age of Onset  . Stroke    . Heart attack      ROS- All systems are reviewed and negative except as per the HPI above  Physical Exam: Filed Vitals:   05/22/11 1208  BP: 196/63  Pulse: 67  Resp: 18  Height: 5\' 3"  (1.6 m)  Weight: 120 lb 12.8 oz (54.795 kg)    GEN- The patient is well appearing, alert and oriented x 3 today.   Head- normocephalic, atraumatic Eyes-  Sclera clear, conjunctiva pink Ears- hearing intact Oropharynx- clear Neck- supple, no JVP Lymph- no cervical lymphadenopathy Lungs- Clear to ausculation bilaterally, normal work of breathing Heart- Regular rate and rhythm, no murmurs, rubs or gallops, PMI not laterally displaced GI- soft, NT, ND, + BS Extremities- no clubbing, cyanosis, or edema MS- no significant deformity or atrophy Skin- no rash or lesion Psych- euthymic mood, full affect Neuro- strength and sensation are intact  EKG today reveals sinus bradycardia 53 bpm, PR 142, QRS 84, Qtc 446, otherwise normal ekg  Assessment and Plan:

## 2011-05-22 NOTE — Patient Instructions (Signed)
Your physician recommends that you schedule a follow-up appointment as needed with Dr Allred   

## 2011-05-22 NOTE — Assessment & Plan Note (Signed)
As above.

## 2011-05-24 ENCOUNTER — Telehealth: Payer: Self-pay

## 2011-05-24 NOTE — Telephone Encounter (Signed)
Left message to call back.   Yes - 400 mg daily x 2 weeks then 200 mg daily - thx ----- Message ----- From: Sharyn Blitz, RN Sent: 05/23/2011 11:31 AM To: Tonny Bollman, MD Subject: [place]   Just spoke to Dr Laruth Bouchard office [facility]) and they said that per documentation the pt has optic neuropathy and eye issues were most likely not caused by Amiodarone. Do you want me to go ahead and restart Amio? And what dose?

## 2011-05-24 NOTE — Telephone Encounter (Signed)
I spoke with the pt's husband and he does not want to have the pt restart Amiodarone on a daily basis at this time.  He said the pt is taking her Metoprolol every other day due to dialysis and is using Amiodarone 100mg  as needed for palpitations.  He feels like the pt is doing well at this point in time with how she is currently taking her medications. They do not want to make any changes at this time.

## 2011-05-24 NOTE — Telephone Encounter (Signed)
Fu call °Patient returning your call °

## 2011-05-26 ENCOUNTER — Other Ambulatory Visit: Payer: Self-pay | Admitting: *Deleted

## 2011-05-26 MED ORDER — AMIODARONE HCL 100 MG PO TABS: ORAL_TABLET | ORAL | Status: DC

## 2011-05-31 ENCOUNTER — Ambulatory Visit (INDEPENDENT_AMBULATORY_CARE_PROVIDER_SITE_OTHER): Payer: Medicaid Other | Admitting: Pharmacist

## 2011-05-31 DIAGNOSIS — Z7901 Long term (current) use of anticoagulants: Secondary | ICD-10-CM

## 2011-05-31 DIAGNOSIS — I4891 Unspecified atrial fibrillation: Secondary | ICD-10-CM

## 2011-05-31 LAB — POCT INR: INR: 1.4

## 2011-06-07 ENCOUNTER — Ambulatory Visit (INDEPENDENT_AMBULATORY_CARE_PROVIDER_SITE_OTHER): Payer: Medicaid Other | Admitting: Pharmacist

## 2011-06-07 DIAGNOSIS — Z7901 Long term (current) use of anticoagulants: Secondary | ICD-10-CM

## 2011-06-07 DIAGNOSIS — I4891 Unspecified atrial fibrillation: Secondary | ICD-10-CM

## 2011-06-07 LAB — POCT INR: INR: 2.3

## 2011-06-12 ENCOUNTER — Encounter: Payer: Self-pay | Admitting: Cardiovascular Disease

## 2011-06-12 ENCOUNTER — Ambulatory Visit (INDEPENDENT_AMBULATORY_CARE_PROVIDER_SITE_OTHER): Payer: Medicaid Other | Admitting: Cardiovascular Disease

## 2011-06-12 VITALS — BP 160/60 | HR 64 | Ht 63.0 in | Wt 121.4 lb

## 2011-06-12 DIAGNOSIS — N189 Chronic kidney disease, unspecified: Secondary | ICD-10-CM

## 2011-06-12 DIAGNOSIS — I129 Hypertensive chronic kidney disease with stage 1 through stage 4 chronic kidney disease, or unspecified chronic kidney disease: Secondary | ICD-10-CM

## 2011-06-12 DIAGNOSIS — I4891 Unspecified atrial fibrillation: Secondary | ICD-10-CM

## 2011-06-12 MED ORDER — AMIODARONE HCL 100 MG PO TABS: 100.0000 mg | ORAL_TABLET | Freq: Two times a day (BID) | ORAL | Status: DC

## 2011-06-12 MED ORDER — METOPROLOL TARTRATE 25 MG PO TABS: ORAL_TABLET | ORAL | Status: DC

## 2011-06-12 NOTE — Assessment & Plan Note (Addendum)
This continues to be a problematic area. The patient wants to discontinue amlodipine. I tried to convince her to remain on this medication, as I think it is probably the most benign medicine that she can take, especially at the low dose she is on. However, she is clearly not going to take it anymore. I will have her start metoprolol 12.5 mg twice daily to be held on the morning of dialysis. Unfortunately this patient is intolerant to just about every antihypertensive medication and I suspect we will continue to have to adjust medicine because of various side-effects.

## 2011-06-12 NOTE — Assessment & Plan Note (Addendum)
Continue amiodarone 100 mg twice daily for rhythm control and warfarin for prevention of thromboembolism. The patient's thyroid studies are followed by Dr. Lucianne Muss. We have communicated with her ophthalmologist who does not feel the patient's vision problems are related to amiodarone.

## 2011-06-12 NOTE — Progress Notes (Signed)
HPI:  76 year old woman presenting for followup evaluation. The patient has atrial fibrillation, malignant hypertension, and end-stage renal disease. She is intolerant to almost all medications. She presents today for routine cardiac followup. The patient was recently seen by Dr. Johney Frame for electrophysiology consultation. She expressed clear wishes to avoid catheter ablation and we decided to continue with her antiarrhythmic therapy. She has complained of hypotension with dialysis. However, her blood pressure has been markedly elevated prior to dialysis. She was recently started on amlodipine 2.5 mg daily, but she complains of nausea and weakness that she associates with this medication. She would like to stop it. She denies chest pain, palpitations, or dyspnea.  Outpatient Encounter Prescriptions as of 06/12/2011  Medication Sig Dispense Refill  . amiodarone (PACERONE) 100 MG tablet Take 1 tablet (100 mg total) by mouth 2 (two) times daily.  180 tablet  3  . calcium acetate (PHOSLO) 667 MG capsule Take 667 mg by mouth 3 (three) times daily before meals.      Marland Kitchen levothyroxine (SYNTHROID, LEVOTHROID) 112 MCG tablet Take 1 tablet (112 mcg total) by mouth every morning. Take on an empty stomach  30 tablet  11  . ranitidine (ZANTAC) 150 MG tablet Take 150 mg by mouth at bedtime.      Marland Kitchen warfarin (COUMADIN) 5 MG tablet Take 1 tablet (5 mg total) by mouth as directed.  40 tablet  3  . DISCONTD: amiodarone (PACERONE) 100 MG tablet Take 1 tab daily as needed for increased heart rate  30 tablet  3  . DISCONTD: amiodarone (PACERONE) 100 MG tablet Take 200 mg by mouth daily.      Marland Kitchen DISCONTD: amLODipine (NORVASC) 2.5 MG tablet Take 5 mg by mouth daily.      . metoprolol tartrate (LOPRESSOR) 25 MG tablet Take one-half tablet by mouth twice a day--do not take the morning of dialysis  90 tablet  3  . DISCONTD: metoprolol tartrate (LOPRESSOR) 25 MG tablet Take 25 mg by mouth See admin instructions. Take 1 tab on Mon,  Wed, Fri & Sat (non dialysis days).      . DISCONTD: metoprolol tartrate (LOPRESSOR) 25 MG tablet Take one by mouth as needed for pulse greater than 120  30 tablet  11    Allergies  Allergen Reactions  . Cardizem (Diltiazem Hcl) Hives  . Codeine Other (See Comments)    dizziness  . Iron     ?? Almost died.  . Rena-Vite (Super B-Complex-Vit C-Fa) Other (See Comments)    Unknown reaction  . Vancomycin Other (See Comments)    dizziness  . Venofer (Iron Sucrose (Iron Oxide Saccharated)) Itching  . Latex Itching and Rash    Past Medical History  Diagnosis Date  . ESRD (end stage renal disease)     HD for 1 year, HD on Tues, Thurs, Sat // Likely secondary to nephrosclerosis from HTN  . HTN (hypertension)   . HLD (hyperlipidemia)   . Cholelithiases     S/P biliary colic cholecystostomy, but not cholecystectomy secondary to omental adhesions  . Anxiety   . Anemia     BL Hgb 8-9. Likely AOCD in setting of ESRD with iron 48, ferritin  746 (03/2010)  . PAF (paroxysmal atrial fibrillation)     On chronic coumadin therapy // Followed by Dr. Excell Seltzer (cardiology)  . Non-ST elevation MI (NSTEMI)     Noninvasive evaluation with Myoview negative for ischemia.  // 2-D echo (05/2010) LVEF 65-70%. Grade 1 diastolic dysfunction.  Peak  PA pressure 52.  Marland Kitchen Hyperthyroidism DX: 07/2010    Likely Grave's Disease. // On diagnosis, TSH < 0.08, free T4 1.94, free T3 5.3 . //  Thyroid US (07/17/2010) - nonspecific diffuse heterogenous thyroid gland. //  Thyrotropin receptor antibody neg (07/17/2010)  . Atrial flutter   . Anticoagulant long-term use     ROS: Negative except as per HPI  BP 160/60  Pulse 64  Ht 5\' 3"  (1.6 m)  Wt 55.067 kg (121 lb 6.4 oz)  BMI 21.51 kg/m2  PHYSICAL EXAM: Pt is alert and oriented, NAD HEENT: normal Neck: JVP - normal, carotids 2+= with bilateral bruits Lungs: CTA bilaterally CV: RRR with grade 2/6 systolic murmur at the left sternal border Abd: soft, NT, Positive BS,  no hepatomegaly Ext: no C/C/E, distal pulses intact and equal Skin: warm/dry no rash  EKG:  Sinus rhythm 64 beats per minute, within normal limits  ASSESSMENT AND PLAN:

## 2011-06-12 NOTE — Patient Instructions (Signed)
Your physician has recommended you make the following change in your medication: TAKE Amiodarone 100mg  take one by mouth twice a day, STOP Amlodipine (Norvasc), START Metoprolol Tartrate 25mg  take one-half tablet by mouth twice a day--do not take the morning of dialysis  Your physician recommends that you schedule a follow-up appointment in: 3 MONTHS with Dr Excell Seltzer

## 2011-06-13 ENCOUNTER — Other Ambulatory Visit: Payer: Self-pay | Admitting: *Deleted

## 2011-06-14 ENCOUNTER — Telehealth: Payer: Self-pay | Admitting: Cardiovascular Disease

## 2011-06-14 NOTE — Telephone Encounter (Signed)
The pt's pacerone required prior authorization.  I have spoken with the insurance company and they have approved the pt's medication. The pt's husband had questions about the pt's BP and heart rate being all over the place.  I instructed him that they need to monitor the pt's BP and pulse and then call back with readings.  The pt just made medication changes on 06/12/11. He agreed with plan.

## 2011-06-14 NOTE — Telephone Encounter (Signed)
Pt calling re her medicine

## 2011-06-19 ENCOUNTER — Telehealth: Payer: Self-pay | Admitting: Cardiovascular Disease

## 2011-06-19 DIAGNOSIS — I4891 Unspecified atrial fibrillation: Secondary | ICD-10-CM

## 2011-06-19 NOTE — Telephone Encounter (Signed)
Would increase metoprolol to 37.5 mg bid. thx

## 2011-06-19 NOTE — Telephone Encounter (Signed)
New Problem:    Patient called in because his blood pressure is fluxuating and he would like advice on how to proceed. Please call back.

## 2011-06-19 NOTE — Telephone Encounter (Signed)
I spoke with the pt's husband and he said the pt's BP is fluctuating. The pt's BP is running 164-180/55-80, pulse 50-60.  He is concerned because the BP is high.  The pt's SBP drops to 120 with dialysis.  The pt has already increased her Metoprolol to a whole tablet (25mg ) twice a day on her own. I will forward this information to Dr Excell Seltzer for further recommendations.

## 2011-06-19 NOTE — Telephone Encounter (Signed)
I spoke with the pt's husband and the pt will increase metoprolol to one and one-half tablet by mouth twice a day, hold metoprolol the morning of dialysis.  They will make medication change and continue to monitor BP.

## 2011-06-22 ENCOUNTER — Other Ambulatory Visit: Payer: Self-pay | Admitting: Vascular Surgery

## 2011-06-22 ENCOUNTER — Ambulatory Visit (INDEPENDENT_AMBULATORY_CARE_PROVIDER_SITE_OTHER): Payer: Medicaid Other | Admitting: *Deleted

## 2011-06-22 DIAGNOSIS — I4891 Unspecified atrial fibrillation: Secondary | ICD-10-CM

## 2011-06-22 DIAGNOSIS — Z7901 Long term (current) use of anticoagulants: Secondary | ICD-10-CM

## 2011-06-22 LAB — POCT INR: INR: 3.8

## 2011-07-05 ENCOUNTER — Emergency Department (HOSPITAL_COMMUNITY)
Admission: EM | Admit: 2011-07-05 | Discharge: 2011-07-05 | Disposition: A | Discharge status: Home / Self Care | Payer: Medicaid Other | Attending: Emergency Medicine | Admitting: Emergency Medicine

## 2011-07-05 ENCOUNTER — Encounter (HOSPITAL_COMMUNITY): Payer: Self-pay | Admitting: Emergency Medicine

## 2011-07-05 DIAGNOSIS — R791 Abnormal coagulation profile: Secondary | ICD-10-CM | POA: Insufficient documentation

## 2011-07-05 DIAGNOSIS — E0590 Thyrotoxicosis, unspecified without thyrotoxic crisis or storm: Secondary | ICD-10-CM | POA: Insufficient documentation

## 2011-07-05 DIAGNOSIS — E785 Hyperlipidemia, unspecified: Secondary | ICD-10-CM | POA: Insufficient documentation

## 2011-07-05 DIAGNOSIS — Z7901 Long term (current) use of anticoagulants: Secondary | ICD-10-CM | POA: Insufficient documentation

## 2011-07-05 DIAGNOSIS — R58 Hemorrhage, not elsewhere classified: Secondary | ICD-10-CM

## 2011-07-05 DIAGNOSIS — I120 Hypertensive chronic kidney disease with stage 5 chronic kidney disease or end stage renal disease: Secondary | ICD-10-CM | POA: Insufficient documentation

## 2011-07-05 DIAGNOSIS — T82898A Other specified complication of vascular prosthetic devices, implants and grafts, initial encounter: Secondary | ICD-10-CM | POA: Insufficient documentation

## 2011-07-05 DIAGNOSIS — I252 Old myocardial infarction: Secondary | ICD-10-CM | POA: Insufficient documentation

## 2011-07-05 DIAGNOSIS — N186 End stage renal disease: Secondary | ICD-10-CM | POA: Insufficient documentation

## 2011-07-05 DIAGNOSIS — Z992 Dependence on renal dialysis: Secondary | ICD-10-CM | POA: Insufficient documentation

## 2011-07-05 DIAGNOSIS — Y841 Kidney dialysis as the cause of abnormal reaction of the patient, or of later complication, without mention of misadventure at the time of the procedure: Secondary | ICD-10-CM | POA: Insufficient documentation

## 2011-07-05 NOTE — ED Notes (Signed)
Lab team at bedside.

## 2011-07-05 NOTE — Discharge Instructions (Signed)
Stop the coumadin tomorrow - call the office tomorrow to see if you should stop it for longer than 2 days and what schedule they would like you on.  Return here with any worsening of bleeding or any other concerns.

## 2011-07-05 NOTE — ED Notes (Signed)
Wound redressed and secured with Choban tape

## 2011-07-05 NOTE — ED Notes (Signed)
Patient had dialysis yesterday, fistula on left upper arm started bleeding this afternoon.  Pressure dressing put on by family.  Patient did have bleeding from area yesterday after dialysis, but did stop.  Usually does not have any bleeding after dialysis.

## 2011-07-05 NOTE — ED Provider Notes (Signed)
History     CSN: [id number]  Arrival date & time 07/05/11  4098   First MD Initiated Contact with Patient 07/05/11 2015      Chief Complaint  Patient presents with  . Vascular Access Problem    (Consider location/radiation/quality/duration/timing/severity/associated sxs/prior treatment) HPI Comments: Patient here with oozing from left upper arm graft site after having dialysis yesterday - states did have bleeding yesterday after dialysis but eventually it did stop - states she is taking coumadin and that her levels were high last week but they did not change her dose of the medication - states today she felt wet on her arm and looked down to see blood oozing from the graft site.  Denies weakness, dizziness, headache, syncope, chest pain or shortness of breath.  Patient is a 76 y.o. female presenting with arm injury. The history is provided by the patient. No language interpreter was used.  Arm Injury  The incident occurred yesterday. The incident occurred at home. Injury mechanism: left upper arm dialysis graft. The wounds were not self-inflicted. No protective equipment was used. She came to the ER via personal transport. There is an injury to the left upper arm. The patient is experiencing no pain. It is unlikely that a foreign body is present. Pertinent negatives include no chest pain, no numbness, no visual disturbance, no abdominal pain, no bowel incontinence, no nausea, no vomiting, no bladder incontinence, no headaches, no hearing loss, no inability to bear weight, no neck pain, no pain when bearing weight, no focal weakness, no decreased responsiveness, no light-headedness, no loss of consciousness, no seizures, no tingling, no weakness, no cough, no difficulty breathing and no memory loss. She is right-handed.    Past Medical History  Diagnosis Date  . ESRD (end stage renal disease)     HD for 1 year, HD on Tues, Thurs, Sat // Likely secondary to nephrosclerosis from HTN  . HTN  (hypertension)   . HLD (hyperlipidemia)   . Cholelithiases     S/P biliary colic cholecystostomy, but not cholecystectomy secondary to omental adhesions  . Anxiety   . Anemia     BL Hgb 8-9. Likely AOCD in setting of ESRD with iron 48, ferritin  746 (03/2010)  . PAF (paroxysmal atrial fibrillation)     On chronic coumadin therapy // Followed by Dr. Excell Hines (cardiology)  . Non-ST elevation MI (NSTEMI)     Noninvasive evaluation with Myoview negative for ischemia.  // 2-D echo (05/2010) LVEF 65-70%. Grade 1 diastolic dysfunction.  Peak PA pressure 52.  Marland Kitchen Hyperthyroidism DX: 07/2010    Likely Grave's Disease. // On diagnosis, TSH < 0.08, free T4 1.94, free T3 5.3 . //  Thyroid US (07/17/2010) - nonspecific diffuse heterogenous thyroid gland. //  Thyrotropin receptor antibody neg (07/17/2010)  . Atrial flutter   . Anticoagulant long-term use     Past Surgical History  Procedure Date  . Other surgical history 02/2009    Cholecystostomy secondary to biliary colic - NO cholecystectomy secondary to omental adhesions - by Dr. Bertram Savin  . Gallbladder surgery   . Breast lumpectomy     right  . [place] artery biopsy 12/14/10    Family History  Problem Relation Age of Onset  . Stroke    . Heart attack      History  Substance Use Topics  . Smoking status: Never Smoker   . Smokeless tobacco: Not on file  . Alcohol Use: No    OB History  Grav Para Term Preterm Abortions TAB SAB Ect Mult Living                  Review of Systems  Constitutional: Negative for decreased responsiveness.  HENT: Negative for hearing loss and neck pain.   Eyes: Negative for visual disturbance.  Respiratory: Negative for cough.   Cardiovascular: Negative for chest pain.  Gastrointestinal: Negative for nausea, vomiting, abdominal pain and bowel incontinence.  Genitourinary: Negative for bladder incontinence.  Neurological: Negative for tingling, focal weakness, seizures, loss of consciousness, weakness,  light-headedness, numbness and headaches.  Psychiatric/Behavioral: Negative for memory loss.  All other systems reviewed and are negative.    Allergies  Cardizem; Codeine; Iron; Rena-vite; Vancomycin; Venofer; and Latex  Home Medications   Current Outpatient Rx  Name Route Sig Dispense Refill  . AMIODARONE HCL 100 MG PO TABS Oral Take 100 mg by mouth 2 (two) times daily.    Marland Kitchen CALCIUM ACETATE 667 MG PO CAPS Oral Take 667 mg by mouth 3 (three) times daily before meals.    Marland Kitchen LEVOTHYROXINE SODIUM 112 MCG PO TABS Oral Take 112 mcg by mouth every morning. Take on an empty stomach    . METOPROLOL TARTRATE 25 MG PO TABS Oral Take 12.5 mg by mouth 2 (two) times daily. Do not take the morning of dialysis 1 tablet 0  . RANITIDINE HCL 150 MG PO TABS Oral Take 150 mg by mouth at bedtime.    . WARFARIN SODIUM 5 MG PO TABS Oral Take 5-7.5 mg by mouth daily. Take 7.5 MG on Mondays, Wednesdays, Fridays, and Sundays Take 5 MG on Tuesdays, Thursdays, and Saturdays.      BP 173/60  Pulse 48  Temp(Src) 98.2 F (36.8 C) (Oral)  Resp 14  SpO2 98%  Physical Exam  Nursing note and vitals reviewed. Constitutional: She is oriented to person, place, and time. She appears well-developed and well-nourished. No distress.  HENT:  Head: Normocephalic and atraumatic.  Right Ear: External ear normal.  Left Ear: External ear normal.  Nose: Nose normal.  Mouth/Throat: Oropharynx is clear and moist. No oropharyngeal exudate.  Eyes: Conjunctivae are normal. Pupils are equal, round, and reactive to light. No scleral icterus.  Neck: Normal range of motion. Neck supple.  Cardiovascular: Normal rate, regular rhythm and normal heart sounds.  Exam reveals no gallop and no friction rub.   No murmur heard. Pulmonary/Chest: Effort normal and breath sounds normal. No respiratory distress.  Abdominal: Soft. Bowel sounds are normal. She exhibits no distension. There is no tenderness.  Musculoskeletal: Normal range of  motion. She exhibits no edema and no tenderness.       Left upper arm graft side with dressing intact.  Lymphadenopathy:    She has no cervical adenopathy.  Neurological: She is alert and oriented to person, place, and time. No cranial nerve deficit.  Skin: Skin is warm and dry. No rash noted. No erythema. No pallor.  Psychiatric: She has a normal mood and affect. Her behavior is normal. Judgment and thought content normal.    ED Course  Procedures (including critical care time)  Labs Reviewed  PROTIME-INR - Abnormal; Notable for the following:    Prothrombin Time 43.9 (*)    INR 4.57 (*)    All other components within normal limits   No results found.   Supratherapeutic INR  MDM  Patient with oozing from left upper arm graft site - INR noted to be 4.57 - has already had coumadin today and  has appointment for coumadin check tomorrow - patient will hold tomorrow's dose and will follow up with clinic then.  No current bleeding or oozing at this time.        [name], [place] 07/05/11 2336

## 2011-07-06 ENCOUNTER — Ambulatory Visit: Payer: Self-pay | Admitting: Cardiovascular Disease

## 2011-07-06 DIAGNOSIS — Z7901 Long term (current) use of anticoagulants: Secondary | ICD-10-CM

## 2011-07-06 DIAGNOSIS — I4891 Unspecified atrial fibrillation: Secondary | ICD-10-CM

## 2011-07-09 NOTE — ED Provider Notes (Signed)
Medical screening examination/treatment/procedure(s) were performed by non-physician practitioner and as supervising physician I was immediately available for consultation/collaboration.  Kaja Jackowski, MD 07/09/11 1713 

## 2011-07-17 ENCOUNTER — Ambulatory Visit (INDEPENDENT_AMBULATORY_CARE_PROVIDER_SITE_OTHER): Payer: Medicaid Other | Admitting: Physician Assistant

## 2011-07-17 ENCOUNTER — Encounter: Payer: Self-pay | Admitting: Physician Assistant

## 2011-07-17 ENCOUNTER — Ambulatory Visit (INDEPENDENT_AMBULATORY_CARE_PROVIDER_SITE_OTHER): Payer: Medicaid Other | Admitting: Pharmacist

## 2011-07-17 VITALS — BP 138/68 | Resp 18 | Ht 64.0 in | Wt 122.1 lb

## 2011-07-17 DIAGNOSIS — I129 Hypertensive chronic kidney disease with stage 1 through stage 4 chronic kidney disease, or unspecified chronic kidney disease: Secondary | ICD-10-CM

## 2011-07-17 DIAGNOSIS — I4891 Unspecified atrial fibrillation: Secondary | ICD-10-CM

## 2011-07-17 DIAGNOSIS — R079 Chest pain, unspecified: Secondary | ICD-10-CM | POA: Insufficient documentation

## 2011-07-17 DIAGNOSIS — N189 Chronic kidney disease, unspecified: Secondary | ICD-10-CM

## 2011-07-17 DIAGNOSIS — Z7901 Long term (current) use of anticoagulants: Secondary | ICD-10-CM

## 2011-07-17 MED ORDER — FELODIPINE ER 2.5 MG PO TB24: ORAL_TABLET | ORAL | Status: DC

## 2011-07-17 NOTE — Assessment & Plan Note (Signed)
Patient has had very difficult to control hypertension on nondialysis days. She is intolerant to most medications. She complains of nausea on amlodipine and now is bradycardic and having chest pain on dialysis since taking metoprolol. I will stop her metoprolol and we will try low dose of Plendil 2.5 mg on nondialysis days. I also discussed a 2 g sodium diet the patient states that she does not get any salt in her diet and is very careful with this.

## 2011-07-17 NOTE — Assessment & Plan Note (Signed)
Patient complaining of chest pain on dialysis when her heart rate drops in the 40s since taking metoprolol. She does have a history of nonobstructive coronary disease on catheter and 2012. We'll stop the metoprolol and monitor her off this. She will see Dr. Excell Seltzer back in 2 weeks.

## 2011-07-17 NOTE — Patient Instructions (Signed)
Your physician has recommended you make the following change in your medication: STOP METOPROLOL  START Plendil 2.5 mg, take one tablet on NON-DIALYSIS DAYS only  Your physician recommends that you schedule a follow-up appointment in: 2 weeks with Dr. Excell Seltzer.  2 Gram Low Sodium Diet A 2 gram sodium diet restricts the amount of sodium in the diet to no more than 2 g or 2000 mg daily. Limiting the amount of sodium is often used to help lower blood pressure. It is important if you have heart, liver, or kidney problems. Many foods contain sodium for flavor and sometimes as a preservative. When the amount of sodium in a diet needs to be low, it is important to know what to look for when choosing foods and drinks. The following includes some information and guidelines to help make it easier for you to adapt to a low sodium diet. QUICK TIPS  Do not add salt to food.   Avoid convenience items and fast food.   Choose unsalted snack foods.   Buy lower sodium products, often labeled as "lower sodium" or "no salt added."   Check food labels to learn how much sodium is in 1 serving.   When eating at a restaurant, ask that your food be prepared with less salt or none, if possible.  READING FOOD LABELS FOR SODIUM INFORMATION The nutrition facts label is a good place to find how much sodium is in foods. Look for products with no more than 500 to 600 mg of sodium per meal and no more than 150 mg per serving. Remember that 2 g = 2000 mg. The food label may also list foods as:  Sodium-free: Less than 5 mg in a serving.   Very low sodium: 35 mg or less in a serving.   Low-sodium: 140 mg or less in a serving.   Light in sodium: 50% less sodium in a serving. For example, if a food that usually has 300 mg of sodium is changed to become light in sodium, it will have 150 mg of sodium.   Reduced sodium: 25% less sodium in a serving. For example, if a food that usually has 400 mg of sodium is changed to  reduced sodium, it will have 300 mg of sodium.  CHOOSING FOODS Grains  Avoid: Salted crackers and snack items. Some cereals, including instant hot cereals. Bread stuffing and biscuit mixes. Seasoned rice or pasta mixes.   Choose: Unsalted snack items. Low-sodium cereals, oats, puffed wheat and rice, shredded wheat. English muffins and bread. Pasta.  Meats  Avoid: Salted, canned, smoked, spiced, pickled meats, including fish and poultry. Bacon, ham, sausage, cold cuts, hot dogs, anchovies.   Choose: Low-sodium canned tuna and salmon. Fresh or frozen meat, poultry, and fish.  Dairy  Avoid: Processed cheese and spreads. Cottage cheese. Buttermilk and condensed milk. Regular cheese.   Choose: Milk. Low-sodium cottage cheese. Yogurt. Sour cream. Low-sodium cheese.  Fruits and Vegetables  Avoid: Regular canned vegetables. Regular canned tomato sauce and paste. Frozen vegetables in sauces. Olives. Rosita Fire. Relishes. Sauerkraut.   Choose: Low-sodium canned vegetables. Low-sodium tomato sauce and paste. Frozen or fresh vegetables. Fresh and frozen fruit.  Condiments  Avoid: Canned and packaged gravies. Worcestershire sauce. Tartar sauce. Barbecue sauce. Soy sauce. Steak sauce. Ketchup. Onion, garlic, and table salt. Meat flavorings and tenderizers.   Choose: Fresh and dried herbs and spices. Low-sodium varieties of mustard and ketchup. Lemon juice. Tabasco sauce. Horseradish.  SAMPLE 2 GRAM SODIUM MEAL PLAN Breakfast /  Sodium (mg)  1 cup low-fat milk / 143 mg   2 slices whole-wheat toast / 270 mg   1 tbs heart-healthy margarine / 153 mg   1 hard-boiled egg / 139 mg   1 small orange / 0 mg  Lunch / Sodium (mg)  1 cup raw carrots / 76 mg    cup hummus / 298 mg   1 cup low-fat milk / 143 mg    cup red grapes / 2 mg   1 whole-wheat pita bread / 356 mg  Dinner / Sodium (mg)  1 cup whole-wheat pasta / 2 mg   1 cup low-sodium tomato sauce / 73 mg   3 oz lean ground beef / 57  mg   1 small side salad (1 cup raw spinach leaves,  cup cucumber,  cup yellow bell pepper) with 1 tsp olive oil and 1 tsp red wine vinegar / 25 mg  Snack / Sodium (mg)  1 container low-fat vanilla yogurt / 107 mg   3 graham cracker squares / 127 mg  Nutrient Analysis  Calories: 2033   Protein: 77 g   Carbohydrate: 282 g   Fat: 72 g   Sodium: 1971 mg  Document Released: 02/27/2005 Document Revised: 02/16/2011 Document Reviewed: 05/31/2009 [facility] Patient Information 2012 [place], [place].

## 2011-07-17 NOTE — Progress Notes (Signed)
HPI:  This is a 76 year old female patient who has a history of atrial fibrillation/flutter treated with amiodarone and Coumadin, malignant hypertension, and end-stage renal disease on dialysis. She saw Dr. Excell Seltzer on 06/12/11 complaining of hypotension with dialysis. She is intolerant to just about all medications and she wanted her amlodipine stopped. He reluctantly stopped this and added low-dose metoprolol but hold on dialysis days.  She also sees Dr. Johney Frame recommended catheter ablation but she declined. Her amiodarone had initially been stopped for decreased vision by Dr. Lucious Groves did not feel was coming from this and her amiodarone was resumed.  The patient comes in today complaining of low heart rate and chest pain when she has dialysis since starting metoprolol. She complains of chest tightness that goes through to her back only on dialysis. Her heart rate has been in the 40s at dialysis. She is only taking the metoprolol on nondialysis days. She says her blood pressure is still 170-200 systolic before dialysis. She had a cardiac catheterization back in March 2012 that showed nonobstructive coronary artery disease. Ejection fraction was 65-70%.Patient denies chest pain, palpitations, dyspnea, dyspnea on exertion outside of dialysis. She says she was nauseated when taking amlodipine. She also complains of nausea with her metoprolol.  Allergies  Allergen Reactions  . Cardizem (Diltiazem Hcl) Hives  . Codeine Other (See Comments)    dizziness  . Iron     ?? Almost died.  . Rena-Vite (B-Plex) Other (See Comments)    Unknown reaction  . Vancomycin Other (See Comments)    dizziness  . Venofer (Iron Sucrose (Iron Oxide Saccharated)) Itching  . Latex Itching and Rash    Current Outpatient Prescriptions on File Prior to Visit  Medication Sig Dispense Refill  . amiodarone (PACERONE) 100 MG tablet Take 100 mg by mouth 2 (two) times daily.      . calcium acetate (PHOSLO) 667 MG capsule Take 667 mg  by mouth 3 (three) times daily before meals.      Marland Kitchen levothyroxine (SYNTHROID, LEVOTHROID) 112 MCG tablet Take 112 mcg by mouth every morning. Take on an empty stomach      . ranitidine (ZANTAC) 150 MG tablet Take 150 mg by mouth at bedtime.      Marland Kitchen warfarin (COUMADIN) 5 MG tablet Take 5-7.5 mg by mouth daily. Take 7.5 MG on Mondays, Wednesdays, Fridays, and Sundays Take 5 MG on Tuesdays, Thursdays, and Saturdays.      . felodipine (PLENDIL) 2.5 MG 24 hr tablet Take one tablet daily only on non-dialysis days.  30 tablet  2    Past Medical History  Diagnosis Date  . ESRD (end stage renal disease)     HD for 1 year, HD on Tues, Thurs, Sat // Likely secondary to nephrosclerosis from HTN  . HTN (hypertension)   . HLD (hyperlipidemia)   . Cholelithiases     S/P biliary colic cholecystostomy, but not cholecystectomy secondary to omental adhesions  . Anxiety   . Anemia     BL Hgb 8-9. Likely AOCD in setting of ESRD with iron 48, ferritin  746 (03/2010)  . PAF (paroxysmal atrial fibrillation)     On chronic coumadin therapy // Followed by Dr. Excell Seltzer (cardiology)  . Non-ST elevation MI (NSTEMI)     Noninvasive evaluation with Myoview negative for ischemia.  // 2-D echo (05/2010) LVEF 65-70%. Grade 1 diastolic dysfunction.  Peak PA pressure 52.  Marland Kitchen Hyperthyroidism DX: 07/2010    Likely Grave's Disease. // On diagnosis, TSH < 0.08,  free T4 1.94, free T3 5.3 . //  Thyroid US (07/17/2010) - nonspecific diffuse heterogenous thyroid gland. //  Thyrotropin receptor antibody neg (07/17/2010)  . Atrial flutter   . Anticoagulant long-term use     Past Surgical History  Procedure Date  . Other surgical history 02/2009    Cholecystostomy secondary to biliary colic - NO cholecystectomy secondary to omental adhesions - by Dr. Bertram Savin  . Gallbladder surgery   . Breast lumpectomy     right  . [place] artery biopsy 12/14/10    Family History  Problem Relation Age of Onset  . Stroke    . Heart attack        History   Social History  . Marital Status: Married    Spouse Name: N/A    Number of Children: 0  . Years of Education: 66 th grad   Occupational History  .     Social History Main Topics  . Smoking status: Never Smoker   . Smokeless tobacco: Not on file  . Alcohol Use: No  . Drug Use: No  . Sexually Active: Not on file   Other Topics Concern  . 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  Originally from [place]    ROS:see history of present illness otherwise negative   PHYSICAL EXAM: Well-nournished, in no acute distress. Neck: No JVD, HJR, Bruit, or thyroid enlargement Lungs: No tachypnea, clear without wheezing, rales, or rhonchi Cardiovascular: RRR, 2/6 systolic murmur at the left sternal border, no gallops, bruit, thrill, or heave. Abdomen: BS normal. Soft without organomegaly, masses, lesions or tenderness. Extremities: without cyanosis, clubbing or edema. Good distal pulses bilateral SKin: Warm, no lesions or rashes  Musculoskeletal: No deformities Neuro: no focal signs  BP 138/68  Resp 18  Ht 5\' 4"  (1.626 m)  Wt 122 lb 1.9 oz (55.393 kg)  BMI 20.96 kg/m2   ZOX:WRUEA bradycardia of 45 beats per minute

## 2011-07-17 NOTE — Assessment & Plan Note (Signed)
Maintaining sinus rhythm on amiodarone. Bradycardic on amiodarone and metoprolol. We'll stop metoprolol.

## 2011-08-04 ENCOUNTER — Ambulatory Visit (INDEPENDENT_AMBULATORY_CARE_PROVIDER_SITE_OTHER): Payer: Medicaid Other | Admitting: Pharmacist

## 2011-08-04 ENCOUNTER — Ambulatory Visit: Payer: Medicaid Other | Admitting: Internal Medicine

## 2011-08-04 DIAGNOSIS — I4891 Unspecified atrial fibrillation: Secondary | ICD-10-CM

## 2011-08-04 DIAGNOSIS — Z7901 Long term (current) use of anticoagulants: Secondary | ICD-10-CM

## 2011-08-18 ENCOUNTER — Ambulatory Visit (INDEPENDENT_AMBULATORY_CARE_PROVIDER_SITE_OTHER): Payer: Medicaid Other | Admitting: *Deleted

## 2011-08-18 DIAGNOSIS — Z7901 Long term (current) use of anticoagulants: Secondary | ICD-10-CM

## 2011-08-18 DIAGNOSIS — I4891 Unspecified atrial fibrillation: Secondary | ICD-10-CM

## 2011-08-18 LAB — POCT INR: INR: 3.9

## 2011-08-21 ENCOUNTER — Telehealth: Payer: Self-pay | Admitting: *Deleted

## 2011-08-21 NOTE — Telephone Encounter (Signed)
Call  For pt's husband asking for appointment for wife.  [name] is not available to ask questions and husband does not know answers to questions I have asked.  The best I can get from him is wife is having abd pain on and off for 3 weeks. He is not sure of which side. She has used heating pad with some relief. He thinks she has small amount of nausea which increases after meals. No change in BM's I offer appointment for today but they can not come. Appointment given for Wednesday.   Asked him to please call back if she feels any worse or has new symptoms.

## 2011-08-21 NOTE — Telephone Encounter (Signed)
Agree with offer for visit today.  Wednesday day is fine if that is the day they can most conveniently come.

## 2011-08-23 ENCOUNTER — Encounter: Payer: Self-pay | Admitting: Internal Medicine

## 2011-08-23 ENCOUNTER — Ambulatory Visit (INDEPENDENT_AMBULATORY_CARE_PROVIDER_SITE_OTHER): Payer: Medicaid Other | Admitting: Internal Medicine

## 2011-08-23 VITALS — BP 168/68 | HR 56 | Temp 97.3°F | Ht 63.0 in | Wt 122.2 lb

## 2011-08-23 DIAGNOSIS — R109 Unspecified abdominal pain: Secondary | ICD-10-CM

## 2011-08-23 NOTE — Patient Instructions (Signed)
You likely had a viral gastroenteritis. Often people can have cramping that lasts a little while after the gastroenteritis has resolved. I believe he will get better in a few days. If you're not better in one week please call and be seen again in the clinic.  Please followup with Dr. Aundria Rud as previously scheduled or in 6 months.  Viral Gastroenteritis Viral gastroenteritis is also called stomach flu. This illness is caused by a certain type of germ (virus). It can cause sudden watery poop (diarrhea) and throwing up (vomiting). This can cause you to lose body fluids (dehydration). This illness usually lasts for 3 to 8 days. It usually goes away on its own. HOME CARE   Drink enough fluids to keep your pee (urine) clear or pale yellow. Drink small amounts of fluids often.   Ask your doctor how to replace body fluid losses (rehydration).   Avoid:   Foods high in sugar.   Alcohol.   Bubbly (carbonated) drinks.   Tobacco.   Juice.   Caffeine drinks.   Very hot or cold fluids.   Fatty, greasy foods.   Eating too much at one time.   Dairy products until 24 to 48 hours after your watery poop stops.   You may eat foods with active cultures (probiotics). They can be found in some yogurts and supplements.   Wash your hands well to avoid spreading the illness.   Only take medicines as told by your doctor. Do not give aspirin to children. Do not take medicines for watery poop (antidiarrheals).   Ask your doctor if you should keep taking your regular medicines.   Keep all doctor visits as told.  GET HELP RIGHT AWAY IF:   You cannot keep fluids down.   You do not pee at least once every 6 to 8 hours.   You are short of breath.   You see blood in your poop or throw up. This may look like coffee grounds.   You have belly (abdominal) pain that gets worse or is just in one small spot (localized).   You keep throwing up or having watery poop.   You have a fever.  MAKE SURE YOU:     Understand these instructions.   Will watch your condition.   Will get help right away if you are not doing well or get worse.  Document Released: 08/16/2007 Document Revised: 02/16/2011 Document Reviewed: 12/14/2010 [facility] Patient Information 2012 [place], [place].

## 2011-08-23 NOTE — Assessment & Plan Note (Signed)
Acute self-limited gastroenteritis now resolving rapidly. No abnormality on LFTs performed June 10. Will advise conservative therapy and watchful waiting for complete resolution.

## 2011-08-23 NOTE — Progress Notes (Signed)
Subjective:   Patient ID: [name] female   DOB: 1935/11/11 76 y.o.   MRN: [id number]  HPI: [name] is a 76 y.o. woman with ESRD on HD, progressive vision loss from HTN and retinal ischemia and occipital stroke, atrial fibrillation on amiodarone and warfarin who presents for evaluation of abdominal pain.  [name] states that she finished her HD session on Thursday and had very low blood pressure to the 80s-90s afterwards. She felt lightheaded and so she ate a chicken salad sandwich.  She then developed acute nausea and diarrhea x1 day only.  She states that she also had crampy RLQ abdominal pain without radiation that ranges in severity from 1-5/10.  Pain today is a 3. The pain is intermittent but has no aggravating or alleviating factors (no change with activity food or bowel movement). Denies fevers, chills, sweats, vomiting, dysuria, increased urinary frequency, hematuria, blood in her stool. Her last bowel movement was this morning and was normal. Essentially all of her symptoms have resolved except for her slight crampy abdominal pain.    Past Medical History  Diagnosis Date  . ESRD (end stage renal disease)     HD for 1 year, HD on Tues, Thurs, Sat // Likely secondary to nephrosclerosis from HTN  . HTN (hypertension)   . HLD (hyperlipidemia)   . Cholelithiases     S/P biliary colic cholecystostomy, but not cholecystectomy secondary to omental adhesions  . Anxiety   . Anemia     BL Hgb 8-9. Likely AOCD in setting of ESRD with iron 48, ferritin  746 (03/2010)  . PAF (paroxysmal atrial fibrillation)     On chronic coumadin therapy // Followed by Dr. Excell Seltzer (cardiology)  . Non-ST elevation MI (NSTEMI)     Noninvasive evaluation with Myoview negative for ischemia.  // 2-D echo (05/2010) LVEF 65-70%. Grade 1 diastolic dysfunction.  Peak PA pressure 52.  Marland Kitchen Hyperthyroidism DX: 07/2010    Likely Grave's Disease. // On diagnosis, TSH < 0.08, free T4 1.94, free T3 5.3 .  //  Thyroid US (07/17/2010) - nonspecific diffuse heterogenous thyroid gland. //  Thyrotropin receptor antibody neg (07/17/2010)  . Atrial flutter   . Anticoagulant long-term use    Current Outpatient Prescriptions  Medication Sig Dispense Refill  . amiodarone (PACERONE) 100 MG tablet Take 100 mg by mouth 2 (two) times daily.      . calcium acetate (PHOSLO) 667 MG capsule Take 667 mg by mouth 3 (three) times daily before meals.      . felodipine (PLENDIL) 2.5 MG 24 hr tablet Take one tablet daily only on non-dialysis days.  30 tablet  2  . latanoprost (XALATAN) 0.005 % ophthalmic solution Place 1 drop into both eyes at bedtime.      Marland Kitchen levothyroxine (SYNTHROID, LEVOTHROID) 112 MCG tablet Take 112 mcg by mouth every morning. Take on an empty stomach      . ranitidine (ZANTAC) 150 MG tablet Take 150 mg by mouth at bedtime.      Marland Kitchen warfarin (COUMADIN) 5 MG tablet Take 5-7.5 mg by mouth daily. Take 7.5 MG on Mondays, Wednesdays, Fridays, and Sundays Take 5 MG on Tuesdays, Thursdays, and Saturdays.       Family History  Problem Relation Age of Onset  . Stroke    . Heart attack     History   Social History  . Marital Status: Married    Spouse Name: N/A    Number of Children: 0  . Years of  Education: 12 th grad   Occupational History  .     Social History Main Topics  . Smoking status: Never Smoker   . Smokeless tobacco: None  . Alcohol Use: No  . Drug Use: No  . Sexually Active: None   Other Topics Concern  . 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 Endorses leg swelling Gastrointestinal: Currently Denies nausea, vomiting,diarrhea blood in stool and abdominal distention. . Endorses abdominal pain and constipation,  Genitourinary: Denies dysuria, urgency,  frequency, hematuria Skin: Denies pallor, rash and wound.  Neurological:  lightheadedness after dialysis   Objective:  Physical Exam: Filed Vitals:   08/23/11 1544  BP: 168/68  Pulse: 56  Temp: 97.3 F (36.3 C)  TempSrc: Oral  Height: 5\' 3"  (1.6 m)  Weight: 122 lb 3.2 oz (55.43 kg)   Constitutional: Vital signs reviewed.  Patient is a very pleasant elderly woman in no acute distress and cooperative with exam.  Head: Normocephalic and atraumatic HEENT: Can see 3 fingers from 5 feet away Cardiovascular: Irregularly irregular rate. Normal rhythm. The thrill from her left upper today AV graft is audible throughout the precordium. Pulmonary/Chest: CTAB, no wheezes, rales, or rhonchi Abdominal: Soft. Non-tender, non-distended, bowel sounds are normal, no masses, organomegaly, or guarding present.  GU: no CVA tenderness Musculoskeletal: No edema  Hematology: no cervical, inginal, or axillary adenopathy.    Lab report from [facility]  TSH 3.4  Albumin 4.1 T. bili 0.4 Alkaline phosphatase 71 AST 11 ALT 7   Assessment & Plan:    Case discussed with Dr. Aundria Rud. Please see problem oriented charting for assessment and plan by problem (best viewed under encounters tab).

## 2011-09-01 ENCOUNTER — Ambulatory Visit (INDEPENDENT_AMBULATORY_CARE_PROVIDER_SITE_OTHER): Payer: Medicaid Other | Admitting: Pharmacist

## 2011-09-01 DIAGNOSIS — Z7901 Long term (current) use of anticoagulants: Secondary | ICD-10-CM

## 2011-09-01 DIAGNOSIS — I4891 Unspecified atrial fibrillation: Secondary | ICD-10-CM

## 2011-09-01 LAB — POCT INR: INR: 3.4

## 2011-09-03 IMAGING — CR DG CHEST 2V
2 series · 2 of 2 positions shown · non-contrast
Comparison: Chest x-ray of 03/01/2009

CLINICAL DATA: Left-sided chest pain, no acute injury

CHEST - 2 VIEW

[w chest pa]
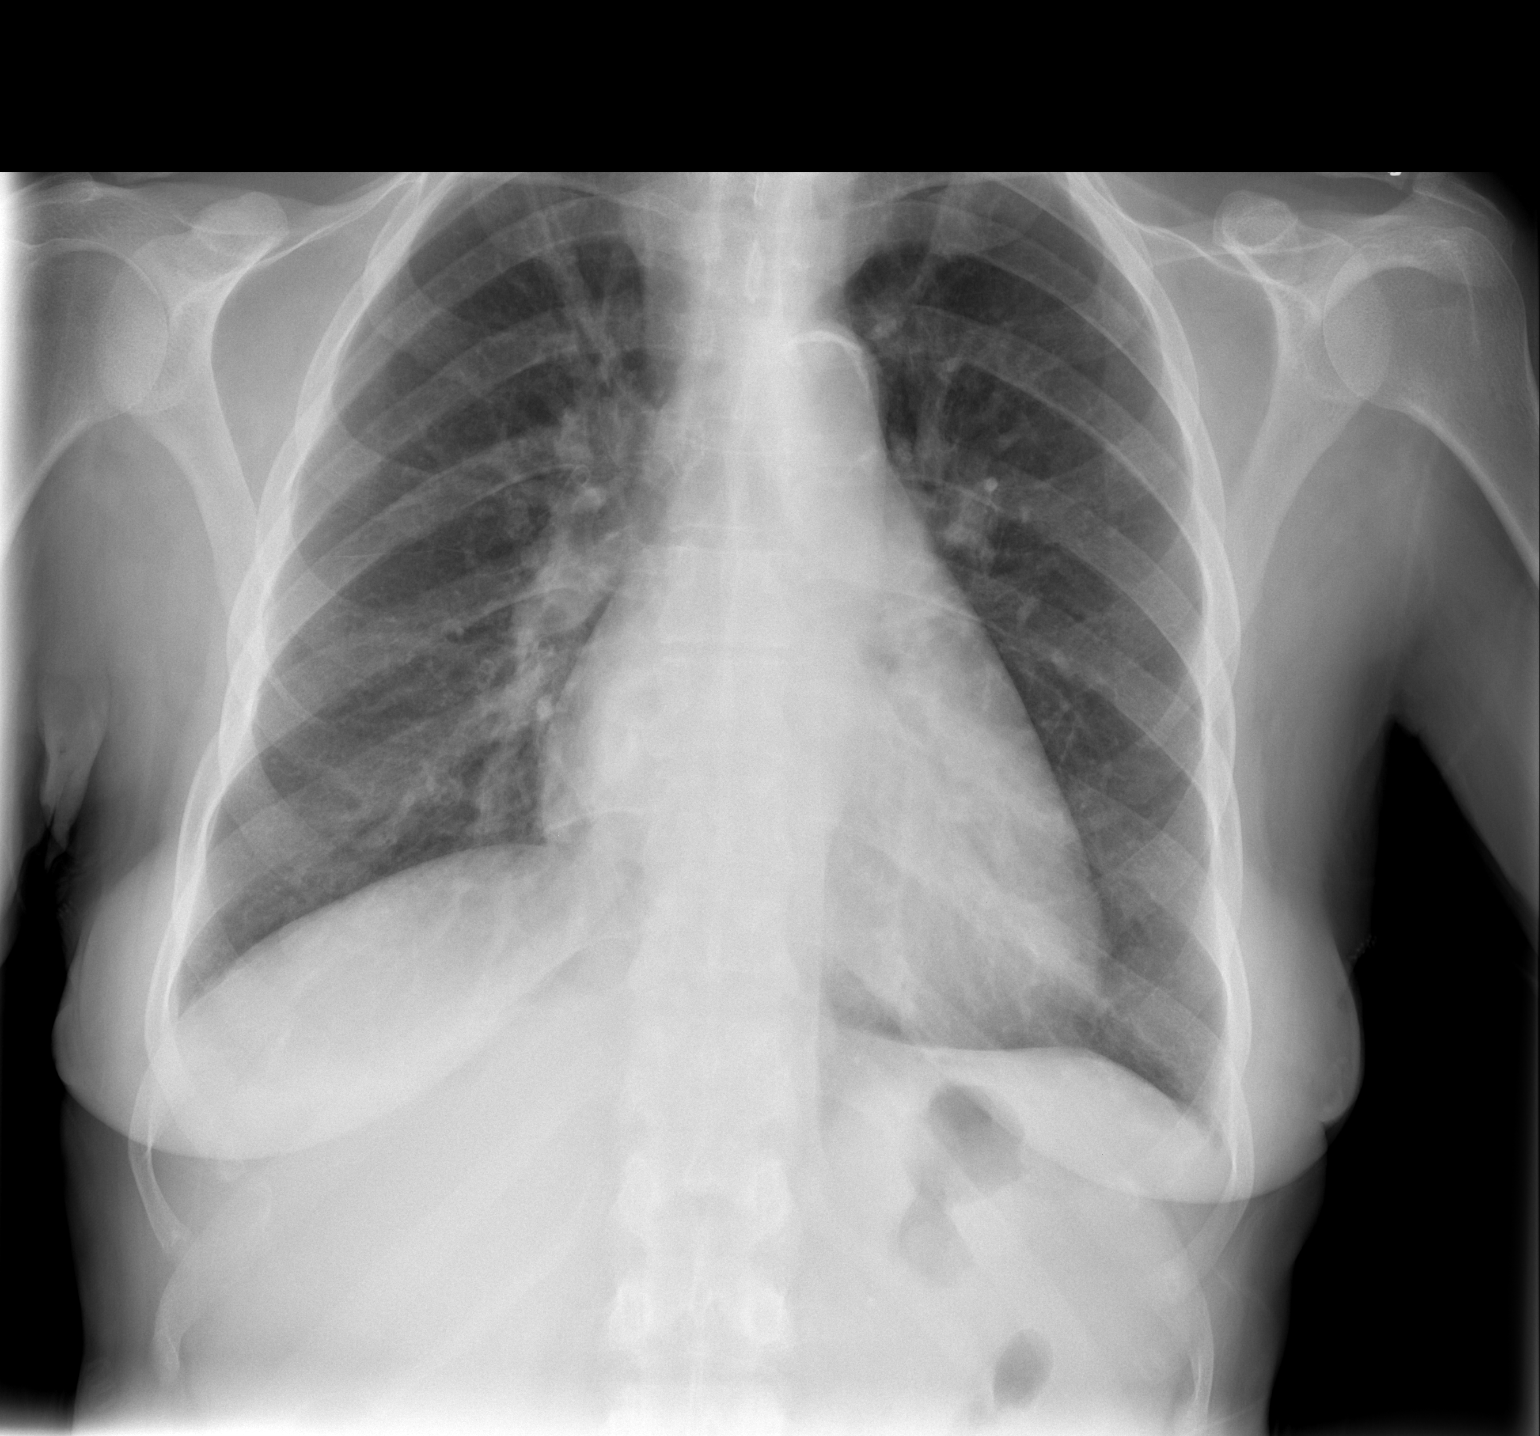

[w chest lat]
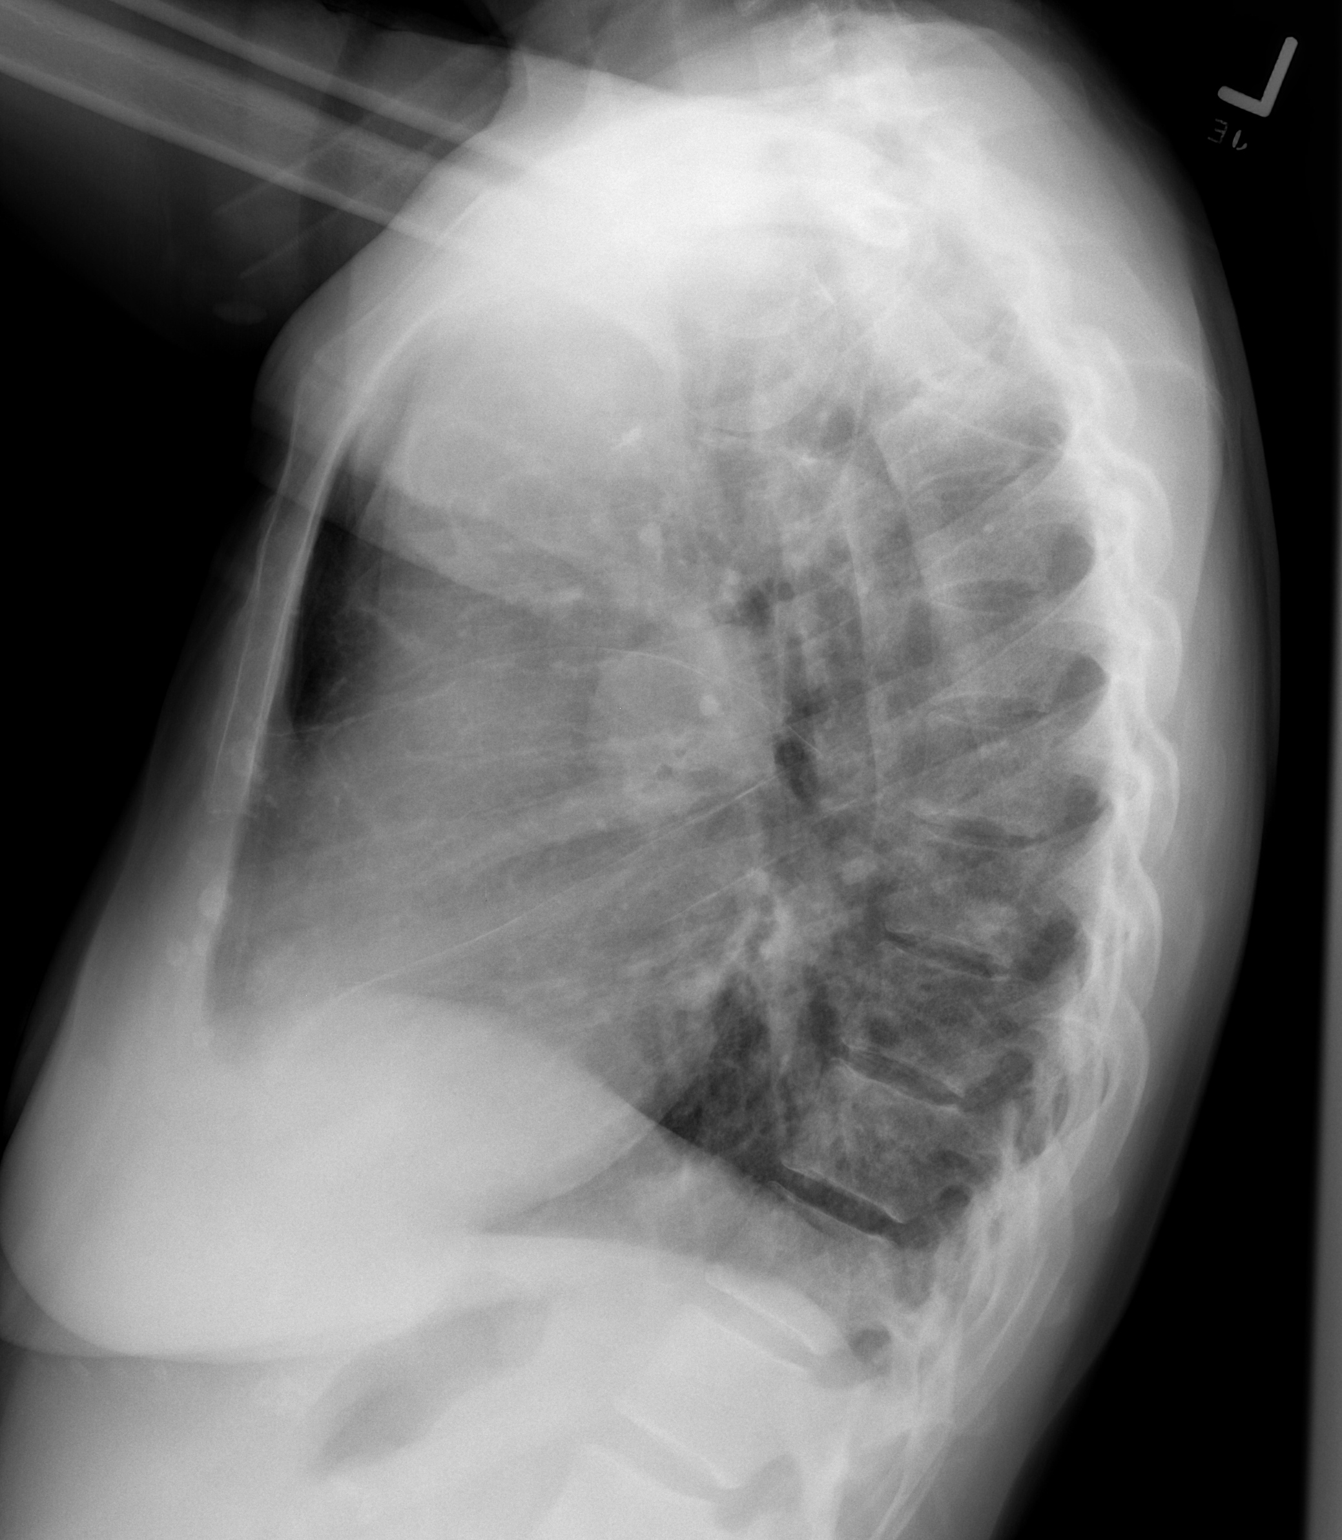

[2 of 2 positions shown; findings below may reference images not displayed]

FINDINGS: The lungs are clear but hyperaerated.  The heart is
mildly enlarged and stable.  No acute bony abnormality is seen.
IMPRESSION: Stable mild cardiomegaly and hyperaeration.  No active lung
disease.

## 2011-09-15 ENCOUNTER — Ambulatory Visit (INDEPENDENT_AMBULATORY_CARE_PROVIDER_SITE_OTHER): Payer: Medicaid Other | Admitting: *Deleted

## 2011-09-15 DIAGNOSIS — I4891 Unspecified atrial fibrillation: Secondary | ICD-10-CM

## 2011-09-15 DIAGNOSIS — Z7901 Long term (current) use of anticoagulants: Secondary | ICD-10-CM

## 2011-09-15 LAB — POCT INR: INR: 2.6

## 2011-10-06 ENCOUNTER — Ambulatory Visit (INDEPENDENT_AMBULATORY_CARE_PROVIDER_SITE_OTHER): Payer: Medicaid Other | Admitting: Pharmacist

## 2011-10-06 DIAGNOSIS — I4891 Unspecified atrial fibrillation: Secondary | ICD-10-CM

## 2011-10-06 DIAGNOSIS — Z7901 Long term (current) use of anticoagulants: Secondary | ICD-10-CM

## 2011-10-06 LAB — POCT INR: INR: 2

## 2011-11-03 ENCOUNTER — Ambulatory Visit (INDEPENDENT_AMBULATORY_CARE_PROVIDER_SITE_OTHER): Payer: Medicaid Other | Admitting: *Deleted

## 2011-11-03 DIAGNOSIS — I4891 Unspecified atrial fibrillation: Secondary | ICD-10-CM

## 2011-11-03 DIAGNOSIS — Z7901 Long term (current) use of anticoagulants: Secondary | ICD-10-CM

## 2011-11-03 LAB — POCT INR: INR: 2

## 2011-12-01 ENCOUNTER — Ambulatory Visit (INDEPENDENT_AMBULATORY_CARE_PROVIDER_SITE_OTHER): Payer: Medicaid Other

## 2011-12-01 DIAGNOSIS — Z7901 Long term (current) use of anticoagulants: Secondary | ICD-10-CM

## 2011-12-01 DIAGNOSIS — I4891 Unspecified atrial fibrillation: Secondary | ICD-10-CM

## 2011-12-08 ENCOUNTER — Ambulatory Visit: Payer: Self-pay | Admitting: Cardiology

## 2011-12-08 DIAGNOSIS — I4891 Unspecified atrial fibrillation: Secondary | ICD-10-CM

## 2011-12-08 DIAGNOSIS — Z7901 Long term (current) use of anticoagulants: Secondary | ICD-10-CM

## 2011-12-13 ENCOUNTER — Ambulatory Visit (INDEPENDENT_AMBULATORY_CARE_PROVIDER_SITE_OTHER): Payer: Medicaid Other | Admitting: Cardiovascular Disease

## 2011-12-13 ENCOUNTER — Encounter: Payer: Self-pay | Admitting: Cardiovascular Disease

## 2011-12-13 ENCOUNTER — Ambulatory Visit (INDEPENDENT_AMBULATORY_CARE_PROVIDER_SITE_OTHER)
Admission: RE | Admit: 2011-12-13 | Discharge: 2011-12-13 | Disposition: A | Discharge status: Home / Self Care | Payer: Medicaid Other | Source: Ambulatory Visit | Attending: Cardiovascular Disease | Admitting: Cardiovascular Disease

## 2011-12-13 ENCOUNTER — Other Ambulatory Visit: Payer: Self-pay | Admitting: Physician Assistant

## 2011-12-13 ENCOUNTER — Ambulatory Visit (INDEPENDENT_AMBULATORY_CARE_PROVIDER_SITE_OTHER): Payer: Medicaid Other | Admitting: *Deleted

## 2011-12-13 VITALS — BP 164/50 | HR 53 | Ht 63.0 in | Wt 126.0 lb

## 2011-12-13 DIAGNOSIS — I4891 Unspecified atrial fibrillation: Secondary | ICD-10-CM

## 2011-12-13 DIAGNOSIS — Z7901 Long term (current) use of anticoagulants: Secondary | ICD-10-CM

## 2011-12-13 LAB — HEPATIC FUNCTION PANEL
ALT: 11 U/L (ref 0–35)
AST: 16 U/L (ref 0–37)
Albumin: 3.8 g/dL (ref 3.5–5.2)
Alkaline Phosphatase: 55 U/L (ref 39–117)
Total Protein: 7.3 g/dL (ref 6.0–8.3)

## 2011-12-13 MED ORDER — FELODIPINE ER 2.5 MG PO TB24: 2.5000 mg | ORAL_TABLET | Freq: Every day | ORAL | Status: DC

## 2011-12-13 NOTE — Patient Instructions (Addendum)
A chest x-ray takes a picture of the organs and structures inside the chest, including the heart, lungs, and blood vessels. This test can show several things, including, whether the heart is enlarges; whether fluid is building up in the lungs; and whether pacemaker / defibrillator leads are still in place. AT [facility],  [address] IN THE BASEMENT   Your physician recommends that you HAVE lab work TODAY: tsh/lft   Your physician recommends that you continue on your current medications as directed. Please refer to the Current Medication list given to you today.   I sent in your Prescription to [facility]   Your physician wants you to follow-up in: 6 MONTHS WITH DR. Theodoro Parma will receive a reminder letter in the mail two months in advance. If you don't receive a letter, please call our office to schedule the follow-up appointment.

## 2011-12-13 NOTE — Progress Notes (Signed)
HPI:  76 year old woman presenting for followup evaluation. The patient has a long-standing history of malignant hypertension, paroxysmal atrial fibrillation, and end-stage renal disease. She has very poor tolerance of her atrial fibrillation and has been hospitalized on many occasions. She is now being managed with long-term amiodarone and she has done very well with this. Unfortunately, she's had progressive vision loss. She's been closely evaluated by ophthalmology and it has been determined that the amiodarone has not caused her vision problems. She denies chest pain, chest pressure, or palpitations. She complains of bilateral leg weakness, especially with difficulty standing up from the sitting position. She denies edema, orthopnea, PND, or shortness of breath. She hasn't had any bleeding problems on long-term warfarin.  Outpatient Encounter Prescriptions as of 12/13/2011  Medication Sig Dispense Refill  . amiodarone (PACERONE) 100 MG tablet Take 100 mg by mouth 2 (two) times daily.      . calcium acetate (PHOSLO) 667 MG capsule Take 667 mg by mouth 3 (three) times daily before meals.      . felodipine (PLENDIL) 2.5 MG 24 hr tablet TAKE ONE TABLET BY MOUTH EVERY DAY ONLY ON NON-DIALYSIS DAYS  30 tablet  1  . latanoprost (XALATAN) 0.005 % ophthalmic solution Place 1 drop into both eyes at bedtime.      Marland Kitchen levothyroxine (SYNTHROID, LEVOTHROID) 112 MCG tablet Take 112 mcg by mouth every morning. Take on an empty stomach      . ranitidine (ZANTAC) 150 MG tablet Take 150 mg by mouth at bedtime.      Marland Kitchen warfarin (COUMADIN) 5 MG tablet Take 5-7.5 mg by mouth daily. Take 7.5 MG on Mondays, Wednesdays, Fridays, and Sundays Take 5 MG on Tuesdays, Thursdays, and Saturdays.      Marland Kitchen DISCONTD: felodipine (PLENDIL) 2.5 MG 24 hr tablet Take one tablet daily only on non-dialysis days.  30 tablet  2    Allergies  Allergen Reactions  . Cardizem (Diltiazem Hcl) Hives  . Codeine Other (See Comments)    dizziness    . Iron     ?? Almost died.  . Rena-Vite (B-Plex) Other (See Comments)    Unknown reaction  . Vancomycin Other (See Comments)    dizziness  . Venofer (Iron Sucrose (Iron Oxide Saccharated)) Itching  . Latex Itching and Rash    Past Medical History  Diagnosis Date  . ESRD (end stage renal disease)     HD for 1 year, HD on Tues, Thurs, Sat // Likely secondary to nephrosclerosis from HTN  . HTN (hypertension)   . HLD (hyperlipidemia)   . Cholelithiases     S/P biliary colic cholecystostomy, but not cholecystectomy secondary to omental adhesions  . Anxiety   . Anemia     BL Hgb 8-9. Likely AOCD in setting of ESRD with iron 48, ferritin  746 (03/2010)  . PAF (paroxysmal atrial fibrillation)     On chronic coumadin therapy // Followed by Dr. Excell Seltzer (cardiology)  . Non-ST elevation MI (NSTEMI)     Noninvasive evaluation with Myoview negative for ischemia.  // 2-D echo (05/2010) LVEF 65-70%. Grade 1 diastolic dysfunction.  Peak PA pressure 52.  Marland Kitchen Hyperthyroidism DX: 07/2010    Likely Grave's Disease. // On diagnosis, TSH < 0.08, free T4 1.94, free T3 5.3 . //  Thyroid US (07/17/2010) - nonspecific diffuse heterogenous thyroid gland. //  Thyrotropin receptor antibody neg (07/17/2010)  . Atrial flutter   . Anticoagulant long-term use     ROS: Negative except as per  HPI  BP 164/50  Pulse 53  Ht 5\' 3"  (1.6 m)  Wt 57.153 kg (126 lb)  BMI 22.32 kg/m2  PHYSICAL EXAM: Pt is alert and oriented, very pleasant elderly woman in NAD HEENT: normal Neck: JVP - normal, carotids 2+= without bruits Lungs: CTA bilaterally CV: Bradycardic and regular without murmur or gallop Abd: soft, NT, Positive BS, no hepatomegaly Ext: Trace pretibial edema bilaterally, distal pulses intact and equal Skin: warm/dry no rash  EKG:  Sinus bradycardia 53 beats per minute, otherwise within normal limits.  ASSESSMENT AND PLAN: 1. Paroxysmal atrial fibrillation. The patient is maintaining sinus rhythm on  amiodarone. She is anticoagulated with warfarin. She is due for thyroid studies, LFTs, and a chest x-ray for amiodarone surveillance. I will see her back in 6 months for followup evaluation.  2. Hypertension, malignant. This is a long-standing problem with difficult control, particularly because of many medication intolerances. She will remain on felodipine.   Of note, the patient complains of bilateral leg weakness and she feels that she has benefited significantly from B12 shots. She asked about this today but I advised her we are not able to do that in the cardiology office. I'm not sure if that is offered through the nephrology office but I will forward this to [name].  Tonny Bollman 12/13/2011 3:00 PM

## 2012-01-12 ENCOUNTER — Ambulatory Visit (INDEPENDENT_AMBULATORY_CARE_PROVIDER_SITE_OTHER): Payer: Medicaid Other | Admitting: *Deleted

## 2012-01-12 DIAGNOSIS — I4891 Unspecified atrial fibrillation: Secondary | ICD-10-CM

## 2012-01-12 DIAGNOSIS — Z7901 Long term (current) use of anticoagulants: Secondary | ICD-10-CM

## 2012-01-12 LAB — POCT INR: INR: 3.3

## 2012-01-22 ENCOUNTER — Telehealth: Payer: Self-pay | Admitting: *Deleted

## 2012-01-22 NOTE — Telephone Encounter (Signed)
Pt's husband walks in to discuss sending records to dentist.  He wasn't sure what they needed so I contacted the office for more information.  Office was requesting a medical clearance for dental work.  I informed office that this should be sent to pt's cardiologist as she is currently taking coumadin which might need to be adjusted prior to dental work. [name], [name]11/11/201311:08 AM

## 2012-01-31 ENCOUNTER — Telehealth: Payer: Self-pay | Admitting: *Deleted

## 2012-01-31 ENCOUNTER — Ambulatory Visit (INDEPENDENT_AMBULATORY_CARE_PROVIDER_SITE_OTHER): Payer: Medicaid Other | Admitting: *Deleted

## 2012-01-31 DIAGNOSIS — I4891 Unspecified atrial fibrillation: Secondary | ICD-10-CM

## 2012-01-31 DIAGNOSIS — Z7901 Long term (current) use of anticoagulants: Secondary | ICD-10-CM

## 2012-01-31 NOTE — Telephone Encounter (Signed)
[name], [place] 01/22/2012 11:08 AM Signed  Pt's husband walks in to discuss sending records to dentist. He wasn't sure what they needed so I contacted the office for more information. Office was requesting a medical clearance for dental work. I informed office that this should be sent to pt's cardiologist as she is currently taking coumadin which might need to be adjusted prior to dental work. [name], [name]11/11/201311:08 AM      Pt is requesting a follow up phone call regarding this could you please follow up on this please so they can schedule this appt?

## 2012-02-01 NOTE — Telephone Encounter (Signed)
Spoke with receptionist at [facility].  They needed a medical clearance form filled out for the patient.  She sent and additional copy to [place] today.  Form was completed and faxed back to dentist office.  LM at pt's home to inform them everything had been taken care of.

## 2012-02-14 ENCOUNTER — Ambulatory Visit (INDEPENDENT_AMBULATORY_CARE_PROVIDER_SITE_OTHER): Payer: Medicaid Other | Admitting: *Deleted

## 2012-02-14 DIAGNOSIS — I4891 Unspecified atrial fibrillation: Secondary | ICD-10-CM

## 2012-02-14 DIAGNOSIS — Z7901 Long term (current) use of anticoagulants: Secondary | ICD-10-CM

## 2012-02-27 ENCOUNTER — Ambulatory Visit (INDEPENDENT_AMBULATORY_CARE_PROVIDER_SITE_OTHER): Payer: Medicaid Other | Admitting: *Deleted

## 2012-02-27 DIAGNOSIS — Z7901 Long term (current) use of anticoagulants: Secondary | ICD-10-CM

## 2012-02-27 DIAGNOSIS — I4891 Unspecified atrial fibrillation: Secondary | ICD-10-CM

## 2012-02-27 LAB — POCT INR: INR: 2.5

## 2012-03-18 ENCOUNTER — Other Ambulatory Visit: Payer: Self-pay | Admitting: Internal Medicine

## 2012-03-18 ENCOUNTER — Other Ambulatory Visit (INDEPENDENT_AMBULATORY_CARE_PROVIDER_SITE_OTHER): Payer: Medicaid Other

## 2012-03-18 DIAGNOSIS — R6889 Other general symptoms and signs: Secondary | ICD-10-CM

## 2012-03-18 DIAGNOSIS — E538 Deficiency of other specified B group vitamins: Secondary | ICD-10-CM

## 2012-03-18 DIAGNOSIS — R899 Unspecified abnormal finding in specimens from other organs, systems and tissues: Secondary | ICD-10-CM

## 2012-03-18 NOTE — Progress Notes (Signed)
Pt saw endo who sent her here for B 12 shots. B 12 result was in nl range, but lower range. Therefore will check MMA and have pt F/U PCP later in month.

## 2012-03-20 ENCOUNTER — Other Ambulatory Visit: Payer: Self-pay | Admitting: Internal Medicine

## 2012-03-20 ENCOUNTER — Ambulatory Visit (INDEPENDENT_AMBULATORY_CARE_PROVIDER_SITE_OTHER): Payer: Medicaid Other | Admitting: *Deleted

## 2012-03-20 DIAGNOSIS — Z7901 Long term (current) use of anticoagulants: Secondary | ICD-10-CM

## 2012-03-20 DIAGNOSIS — E538 Deficiency of other specified B group vitamins: Secondary | ICD-10-CM | POA: Insufficient documentation

## 2012-03-20 DIAGNOSIS — I4891 Unspecified atrial fibrillation: Secondary | ICD-10-CM

## 2012-03-20 LAB — METHYLMALONIC ACID, SERUM: Methylmalonic Acid, Quant: 2.23 umol/L — ABNORMAL HIGH (ref <0.40)

## 2012-03-20 MED ORDER — CYANOCOBALAMIN 1000 MCG/ML IJ SOLN: 1000.0000 ug | Freq: Every day | INTRAMUSCULAR | Status: DC

## 2012-03-20 NOTE — Progress Notes (Signed)
Inocencio Homes talked to me about this pt that was sent from outlying MD office to [place] to get B 12 injections. But her B 12 level that was sent with her was nl. So I checked MMA and it was elevated. Would you pls let her know that she can start getting B 12 IM injections either at clinic or she can admin them at home.   Daily for 7 days - order is in Weekly for 4 weeks Then EITHER IM monthly or PO daily.

## 2012-03-22 NOTE — Progress Notes (Signed)
Pt has appointment 1/27 and will set up times for B-12 injections at that visit

## 2012-04-08 ENCOUNTER — Encounter: Payer: Medicaid Other | Admitting: Internal Medicine

## 2012-04-15 ENCOUNTER — Ambulatory Visit (HOSPITAL_COMMUNITY)
Admission: RE | Admit: 2012-04-15 | Discharge: 2012-04-15 | Disposition: A | Discharge status: Home / Self Care | Payer: Medicaid Other | Source: Ambulatory Visit | Attending: Internal Medicine | Admitting: Internal Medicine

## 2012-04-15 ENCOUNTER — Ambulatory Visit (INDEPENDENT_AMBULATORY_CARE_PROVIDER_SITE_OTHER): Payer: Medicaid Other | Admitting: Internal Medicine

## 2012-04-15 ENCOUNTER — Ambulatory Visit (INDEPENDENT_AMBULATORY_CARE_PROVIDER_SITE_OTHER): Payer: Medicaid Other

## 2012-04-15 ENCOUNTER — Encounter: Payer: Self-pay | Admitting: Internal Medicine

## 2012-04-15 VITALS — BP 182/55 | HR 54 | Temp 97.2°F | Ht 63.0 in | Wt 130.6 lb

## 2012-04-15 DIAGNOSIS — N189 Chronic kidney disease, unspecified: Secondary | ICD-10-CM

## 2012-04-15 DIAGNOSIS — Z79899 Other long term (current) drug therapy: Secondary | ICD-10-CM

## 2012-04-15 DIAGNOSIS — M545 Low back pain, unspecified: Secondary | ICD-10-CM | POA: Insufficient documentation

## 2012-04-15 DIAGNOSIS — I4891 Unspecified atrial fibrillation: Secondary | ICD-10-CM

## 2012-04-15 DIAGNOSIS — M47817 Spondylosis without myelopathy or radiculopathy, lumbosacral region: Secondary | ICD-10-CM | POA: Insufficient documentation

## 2012-04-15 DIAGNOSIS — Z7901 Long term (current) use of anticoagulants: Secondary | ICD-10-CM

## 2012-04-15 DIAGNOSIS — I129 Hypertensive chronic kidney disease with stage 1 through stage 4 chronic kidney disease, or unspecified chronic kidney disease: Secondary | ICD-10-CM

## 2012-04-15 DIAGNOSIS — E538 Deficiency of other specified B group vitamins: Secondary | ICD-10-CM

## 2012-04-15 DIAGNOSIS — M549 Dorsalgia, unspecified: Secondary | ICD-10-CM

## 2012-04-15 DIAGNOSIS — R0789 Other chest pain: Secondary | ICD-10-CM

## 2012-04-15 DIAGNOSIS — E0590 Thyrotoxicosis, unspecified without thyrotoxic crisis or storm: Secondary | ICD-10-CM

## 2012-04-15 LAB — IRON AND TIBC
Iron: 36 ug/dL — ABNORMAL LOW (ref 42–145)
UIBC: 178 ug/dL (ref 125–400)

## 2012-04-15 LAB — VITAMIN B12: Vitamin B-12: >2000 pg/mL — ABNORMAL HIGH (ref 211–911)

## 2012-04-15 LAB — LIPID PANEL
HDL: 58 mg/dL (ref >39)
Total CHOL/HDL Ratio: 3.2 Ratio

## 2012-04-15 LAB — POCT INR: INR: 2.6

## 2012-04-15 MED ORDER — TRAMADOL HCL 50 MG PO TABS
50.0000 mg | ORAL_TABLET | Freq: Four times a day (QID) | ORAL | Status: DC | PRN
Start: 2012-04-15 — End: 2012-04-29

## 2012-04-15 MED ORDER — CYANOCOBALAMIN 1000 MCG/ML IJ SOLN: 1000.0000 ug | INTRAMUSCULAR | Status: AC

## 2012-04-15 MED ORDER — CYANOCOBALAMIN 1000 MCG/ML IJ SOLN
1000.0000 ug | Freq: Once | INTRAMUSCULAR | Status: AC
  Administered 2012-04-15: 1000 ug via INTRAMUSCULAR

## 2012-04-15 MED ORDER — CYANOCOBALAMIN 1000 MCG/ML IJ SOLN: 1000.0000 ug | Freq: Once | INTRAMUSCULAR | Status: DC

## 2012-04-15 NOTE — Progress Notes (Signed)
Husband is aware of Bone density test [facility] 04/24/12 8:45AM and PFT [facility] 04/22/12 Vira Blanco Nasario Czerniak RN 04/15/12 2PM

## 2012-04-15 NOTE — Assessment & Plan Note (Signed)
Unprovoked lumbar back pain of two weeks duration now that has not responded to Tylenol or heating pad. This is concerning for compression fracture v diskitis v malignancy. No neurologic deficits per HPI or exam with no MRI needed at this time.  -Lumbar spine Xray -Tramadol 50mg  q6hr PRN for pain #30 -Dexa scan -Pt advised to call us if her pain is not improving, she agreed and understood.

## 2012-04-15 NOTE — Patient Instructions (Signed)
-  Please come back twice per week for your B12 injections. -You need an Xray of your back -You may take Tramadol for your back pain--the prescription was sent to your pharmacy -You will need to have a Dexa scan for your bones -You need a breathing test to evaluate your lung function.  -Let us know if your back pain is not improving--you may call for a follow up appointment.  -It was great meeting you today!

## 2012-04-15 NOTE — Assessment & Plan Note (Signed)
No complaints today. Will check lipid panel

## 2012-04-15 NOTE — Addendum Note (Signed)
Addended by: Youlanda Roys A on: 04/15/2012 02:02 PM   Modules accepted: Orders

## 2012-04-15 NOTE — Assessment & Plan Note (Addendum)
No on A.fib per exam today. She is on amiodarone and warfarin which are currently managed by Dr. Excell Seltzer at [facility]. TSH within nl limit, CXR on 12/13/11 showing borderline enlargement of the cardiac silhouette but no pulmonary edema, pneumonia, or other acute or active abnormality.  -Ordered PFTs

## 2012-04-15 NOTE — Assessment & Plan Note (Signed)
Pt reports having B-12 injections 2 years ago and would like to proceed with injections v PO at this time. She is on HD T/Th/Sat and will not be able to come for daily injections x7 days but is willing to come 2x /week. We will give two injections per week for one month, and will consider 1 injection /week followed by either PO B12 or monthly B12 injections.  -Anemia panel today with homocysteine

## 2012-04-15 NOTE — Assessment & Plan Note (Signed)
BP of 182/55 today, she is followed closely by Cardiology, she is complaint to felodipine. She is allergic to several anti-hypertensive and management will be deferred to her Cardiologist. Of note, she is likely hypotensive at the HD center experiencing dizziness after HD.

## 2012-04-15 NOTE — Progress Notes (Signed)
  Subjective:    Patient ID: [name], female    DOB: Jun 01, 1935, 77 y.o.   MRN: [id number]  HPI Ms. Zeiders is a 77 year old woman with PMH significant for ESRD on HD on T/Th/Sat, A.fib on chronic anticoagulation, and uncontrolled HTN who presents today for evaluation of B12 deficiency. In the past, two years ago, she was told she was B12 deficient and required injections. She requests injections again today instead of PO therapy. She had recent labs with nl/low B12 but high MMA consistent with findings of B12 deficiency. She complains of bilateral LE weakness but this is present only after her HD with unsteady gait and I suspect this is likely related to orthostatic hypotension. She denies falls.  Today she also complains of low back pain that started two weeks ago. She denies provoking event such as fall or lifting of heavy objects, she simply woke up with this pain. The pain is described as an ache that starts on her low back and sometimes radiates to her abdomen. The pain has bee constant. She has taken Tylenol extra strength, she has applied heating pad to the area with no relief of her pain. She denies bowel or bladder incontinence or urinary retention.     Review of Systems  Constitutional: Negative for fever, chills, diaphoresis, activity change, appetite change, fatigue and unexpected weight change.  Respiratory: Negative for cough, choking and shortness of breath.   Cardiovascular: Negative for chest pain.  Gastrointestinal: Negative for abdominal pain.  Genitourinary: Negative for dysuria, flank pain, decreased urine volume and difficulty urinating.  Musculoskeletal: Positive for back pain and gait problem. Negative for myalgias, joint swelling and arthralgias.  Neurological: Negative for dizziness and headaches.       Objective:   Physical Exam  Nursing note and vitals reviewed. Constitutional: She is oriented to person, place, and time. She appears well-developed and  well-nourished. No distress.  HENT:  Head: Atraumatic.  Mouth/Throat: No oropharyngeal exudate.  Eyes: Conjunctivae normal are normal.  Cardiovascular: Normal rate and regular rhythm.   Murmur heard. Pulmonary/Chest: Effort normal and breath sounds normal. No respiratory distress. She has no wheezes.  Abdominal: Soft.  Musculoskeletal: Normal range of motion. She exhibits tenderness. She exhibits no edema.       Lumbar paraspinal tenderness. Straight leg raise test negative bilaterally. Normal gait. Pt unable to fully flex spine secondary to lumbar spine pain.   Neurological: She is alert and oriented to person, place, and time. She has normal reflexes. She displays normal reflexes. No cranial nerve deficit. She exhibits normal muscle tone. Coordination normal.       5/5 strength in LE. Patellar reflexes 2+ bilaterally. Intact sensation in LE bilaterally. Steady gait.   Skin: Skin is warm and dry. No rash noted. She is not diaphoretic. No erythema. No pallor.       No bruises noted on lumbar back.   Psychiatric: She has a normal mood and affect. Her behavior is normal.          Assessment & Plan:

## 2012-04-15 NOTE — Assessment & Plan Note (Addendum)
On it for A.fib. She is followed closely at [facility] with frequent INR checks. She had it checked today, reported value of ~2.6.

## 2012-04-15 NOTE — Assessment & Plan Note (Addendum)
Last TSH 3.77 on 12/13/11. Continue synthroid 112 mcg

## 2012-04-16 ENCOUNTER — Other Ambulatory Visit: Payer: Self-pay | Admitting: *Deleted

## 2012-04-16 ENCOUNTER — Other Ambulatory Visit: Payer: Self-pay | Admitting: Cardiovascular Disease

## 2012-04-16 LAB — HOMOCYSTEINE: Homocysteine: 38.2 umol/L — ABNORMAL HIGH (ref 4.0–15.4)

## 2012-04-16 NOTE — Telephone Encounter (Signed)
New Problem:     Patient's husband called in needing a refill of his wife's warfarin (COUMADIN) 5 MG tablet.

## 2012-04-19 ENCOUNTER — Ambulatory Visit (INDEPENDENT_AMBULATORY_CARE_PROVIDER_SITE_OTHER): Payer: Medicaid Other | Admitting: *Deleted

## 2012-04-19 DIAGNOSIS — E538 Deficiency of other specified B group vitamins: Secondary | ICD-10-CM

## 2012-04-19 MED ORDER — CYANOCOBALAMIN 1000 MCG/ML IJ SOLN
1000.0000 ug | Freq: Once | INTRAMUSCULAR | Status: AC
  Administered 2012-04-19: 1000 ug via INTRAMUSCULAR

## 2012-04-22 ENCOUNTER — Ambulatory Visit (INDEPENDENT_AMBULATORY_CARE_PROVIDER_SITE_OTHER): Payer: Medicaid Other | Admitting: Internal Medicine

## 2012-04-22 ENCOUNTER — Encounter (HOSPITAL_COMMUNITY): Payer: Medicaid Other

## 2012-04-22 DIAGNOSIS — E538 Deficiency of other specified B group vitamins: Secondary | ICD-10-CM

## 2012-04-22 MED ORDER — CYANOCOBALAMIN 1000 MCG/ML IJ SOLN
1000.0000 ug | Freq: Once | INTRAMUSCULAR | Status: AC
  Administered 2012-04-22: 1000 ug via INTRAMUSCULAR

## 2012-04-24 ENCOUNTER — Ambulatory Visit (HOSPITAL_COMMUNITY)
Admission: RE | Admit: 2012-04-24 | Discharge: 2012-04-24 | Disposition: A | Discharge status: Home / Self Care | Payer: Medicaid Other | Source: Ambulatory Visit | Attending: Internal Medicine | Admitting: Internal Medicine

## 2012-04-24 DIAGNOSIS — Z13820 Encounter for screening for osteoporosis: Secondary | ICD-10-CM | POA: Insufficient documentation

## 2012-04-24 DIAGNOSIS — M545 Low back pain, unspecified: Secondary | ICD-10-CM | POA: Insufficient documentation

## 2012-04-24 DIAGNOSIS — M549 Dorsalgia, unspecified: Secondary | ICD-10-CM

## 2012-04-24 DIAGNOSIS — N186 End stage renal disease: Secondary | ICD-10-CM | POA: Insufficient documentation

## 2012-04-26 ENCOUNTER — Ambulatory Visit: Payer: Medicaid Other

## 2012-04-29 ENCOUNTER — Telehealth: Payer: Self-pay | Admitting: *Deleted

## 2012-04-29 ENCOUNTER — Encounter: Payer: Self-pay | Admitting: Internal Medicine

## 2012-04-29 ENCOUNTER — Ambulatory Visit (INDEPENDENT_AMBULATORY_CARE_PROVIDER_SITE_OTHER): Payer: Medicaid Other | Admitting: Internal Medicine

## 2012-04-29 ENCOUNTER — Ambulatory Visit (INDEPENDENT_AMBULATORY_CARE_PROVIDER_SITE_OTHER): Payer: Medicaid Other | Admitting: *Deleted

## 2012-04-29 ENCOUNTER — Encounter: Payer: Self-pay | Admitting: Cardiology

## 2012-04-29 VITALS — BP 162/62 | HR 59 | Temp 98.4°F | Wt 132.9 lb

## 2012-04-29 DIAGNOSIS — I120 Hypertensive chronic kidney disease with stage 5 chronic kidney disease or end stage renal disease: Secondary | ICD-10-CM

## 2012-04-29 DIAGNOSIS — E538 Deficiency of other specified B group vitamins: Secondary | ICD-10-CM

## 2012-04-29 DIAGNOSIS — N186 End stage renal disease: Secondary | ICD-10-CM

## 2012-04-29 DIAGNOSIS — M545 Low back pain: Secondary | ICD-10-CM

## 2012-04-29 DIAGNOSIS — S40029A Contusion of unspecified upper arm, initial encounter: Secondary | ICD-10-CM

## 2012-04-29 DIAGNOSIS — E559 Vitamin D deficiency, unspecified: Secondary | ICD-10-CM

## 2012-04-29 DIAGNOSIS — I129 Hypertensive chronic kidney disease with stage 1 through stage 4 chronic kidney disease, or unspecified chronic kidney disease: Secondary | ICD-10-CM

## 2012-04-29 DIAGNOSIS — M549 Dorsalgia, unspecified: Secondary | ICD-10-CM

## 2012-04-29 DIAGNOSIS — E785 Hyperlipidemia, unspecified: Secondary | ICD-10-CM

## 2012-04-29 DIAGNOSIS — Z7901 Long term (current) use of anticoagulants: Secondary | ICD-10-CM

## 2012-04-29 LAB — POCT INR: INR: 4.4

## 2012-04-29 MED ORDER — CYANOCOBALAMIN 1000 MCG/ML IJ SOLN
1000.0000 ug | Freq: Once | INTRAMUSCULAR | Status: AC
  Administered 2012-04-29: 1000 ug via INTRAMUSCULAR

## 2012-04-29 MED ORDER — CYANOCOBALAMIN 1000 MCG/ML IJ SOLN: 1000.0000 ug | Freq: Once | INTRAMUSCULAR | Status: DC

## 2012-04-29 MED ORDER — HYDROCODONE-ACETAMINOPHEN 5-325 MG PO TABS: 1.0000 | ORAL_TABLET | Freq: Three times a day (TID) | ORAL | Status: DC | PRN

## 2012-04-29 NOTE — Patient Instructions (Signed)
-  Take folic acid daily  -Take warfarin 2.5 mg (half tablet) on Monday, Wednesday, and Friday -Take warfarin 5mg  on Tuesday, Thursday, Saturday, and Sunday.  -Follow up with [facility] next week -Follow up with [place] if your back pain does not improve. Be careful to take your pain medicine at night and be aware of possible dizziness. -It was great seeing you!   Progress Toward Treatment Goals:  Treatment Goal 04/29/2012  Blood pressure deteriorated    Self Care Goals & Plans:  Self Care Goal 04/29/2012  Manage my medications take my medicines as prescribed; refill my medications on time  Monitor my health keep track of my blood pressure; bring my blood pressure log to each visit  Eat healthy foods eat foods that are low in salt  Be physically active -       Care Management & Community Referrals:  Referral 04/29/2012  Referrals made for care management support other (see comment)

## 2012-04-29 NOTE — Assessment & Plan Note (Signed)
She continues HD on T/Th/Sat

## 2012-04-29 NOTE — Assessment & Plan Note (Addendum)
Bruise on right arm ~5cmx10cm, at site of previous B12 injection followed by continues blood pressure cuff use for BP monitoring during HD. The bruise does not appear infected with no surrounding edema, no increased warmth, the pt denies fever. This is likely 2/2 current warfarin tx with INR increase (4.4 today).  -Continue monitoring -Warfarin adjustment per [facility] [employer] [facility] Pharmacist

## 2012-04-29 NOTE — Telephone Encounter (Signed)
Pt currently getting b12 injections (twice a week for one mth).  Patient wishes to have this done at dialysis center as it would it would be more convenient, since parking and getting to the clinic has become an issue. Spoke with Irving Burton, who checked with office "DM" who informed me that they would not be able to accommodate this request.  Will make pt and MD aware.[name], [name]2/17/201411:28 AM

## 2012-04-29 NOTE — Progress Notes (Signed)
Pt arrived for B 12 injection and asked that I given in hip as last injection caused bruising to upper arm. I checked rt deltoid area and noted resolving bruising and could feel indurated area about 2 cm X 2 cm below bruised area. I asked Dr Meredith Pel to inspect area, he advised pt to watch area and call is any changes in this area.  Pt voices understanding, then asked about increasing pain medication so I put her in for an office visit with her PCP.

## 2012-04-29 NOTE — Assessment & Plan Note (Signed)
Last LDL at 106, continue statin.

## 2012-04-29 NOTE — Assessment & Plan Note (Addendum)
She currently does not take vitamin D as she has stomach upset with supplementation. She declined vitamin D supplementation at this time. Her Dexa scan has no sign of osteopenia but she is nl/low according to recent exam.  -Pt advised to discuss Ca and Vitamin D supplementation with her Nephrologist.

## 2012-04-29 NOTE — Assessment & Plan Note (Signed)
BP of 162/62 today, slightly improved from last visit but still elevated. She is compliant to her medications, she has taken them this morning and reports being concerned with increases in her dosages as she often gets lightheaded after HD.  She is advised to follow up with her Cardiologist, Dr. Excell Seltzer.

## 2012-04-29 NOTE — Progress Notes (Signed)
  Subjective:    Patient ID: [name], female    DOB: 05-Jan-1936, 77 y.o.   MRN: [id number]  HPI [name] is a 77 year old woman with PMH of ESRD on HD on T/Th/Sat, HTN, A.fib, hyperlipidemia, and vitamin B12 deficiency who comes in today for evaluation of a bruise at the site of her B12 injection that was given last week. She reports that she noted the bruise after her HD and is concerned that perhaps the blood pressure cuff over her arm may have led to bruising at that site. She also complains of persistent low back pain that keeps her awake at times when she tries to low down flat. She has taken Ultram but with no relief of her pain, she has also applied heating pads to there lower back with minimum relief of her symptoms. She denies bowel of bladder retention, leg numbness or tingling, or sciatica.     Review of Systems  Constitutional: Negative for fever, chills, diaphoresis, activity change, appetite change, fatigue and unexpected weight change.  Respiratory: Negative for cough, shortness of breath and wheezing.   Cardiovascular: Negative for chest pain and leg swelling.  Gastrointestinal: Negative for abdominal pain.  Genitourinary: Negative for dysuria.  Musculoskeletal: Positive for back pain.  Neurological: Negative for dizziness, weakness, numbness and headaches.  Psychiatric/Behavioral: Negative for behavioral problems and agitation.       Objective:   Physical Exam  Nursing note and vitals reviewed. Constitutional: She is oriented to person, place, and time. She appears well-developed and well-nourished. No distress.  Eyes: Conjunctivae are normal.  Cardiovascular: Normal rate and regular rhythm.   Pulmonary/Chest: Effort normal and breath sounds normal.  Abdominal: Soft.  Musculoskeletal: She exhibits no edema.  A ~5cm x 10cm bruise with green/purlpe discoloration is noted on the right proximal arm above her deltoid with small area of induration just below the  bruise. She has no TTP, no increased warmth, discharge, or surrounding edema. ROM of the arm is normal  Neurological: She is alert and oriented to person, place, and time.  Skin: Skin is warm and dry. No rash noted. She is not diaphoretic.  Psychiatric: She has a normal mood and affect. Her behavior is normal.          Assessment & Plan:

## 2012-04-29 NOTE — Assessment & Plan Note (Signed)
Xray on 2/3 of lumbar spine showed DJD changes bilaterally at L5-S1, coinciding with her source of pain. Tramadol has not resolved her pain. Heating pad provides minimum relief. She is allergic to codeine (which causes severe dizziness), pt was given prescription for Vicodin 5-325mg  qHS and x1 PRN for pain #20. She was counseled on the possible side effect of this medication including dizziness and increased risk for falls.  She denies loss of control of bowel/bladder function, paresthesia, numbness/tingling a LE, or sciatica.   If her pain persists, will consider MRI of spine for eval of possible malignancy, infection, or diskitis.

## 2012-04-29 NOTE — Assessment & Plan Note (Addendum)
Continue B12 injections twice per week for two more weeks, than once monthly IM. Unfortunately B12 injection cannot be given at the HD center at this time.  Of note, homocysteine level elevated, pt likely also has folate deficiency as she does not eat leafy green vegetables due to warfarin therapy.  -Pt instructed to take folate daily, OTC

## 2012-04-29 NOTE — Assessment & Plan Note (Signed)
INR of 4.4 today (POC). Pt denies increased consumption of green vegetables. She was taken Ultram for pain (not daily) and has had B12 injections but denies any other new medications. Spoke with Susy Manor, Pharmacist at [facility] who confirms no known interaction of Ultram and B12 on warfarin. Pt was instructed (via phone with Alcario Drought and with written instructions) to take coumadin 2.5 mg on M/W/F and 5mg  on T/Th/Sat/Sunday. She has follow up appointment at the coumadin clinic next week for INR recheck.

## 2012-05-03 ENCOUNTER — Ambulatory Visit (INDEPENDENT_AMBULATORY_CARE_PROVIDER_SITE_OTHER): Payer: Medicaid Other

## 2012-05-03 ENCOUNTER — Ambulatory Visit (INDEPENDENT_AMBULATORY_CARE_PROVIDER_SITE_OTHER): Payer: Medicaid Other | Admitting: *Deleted

## 2012-05-03 DIAGNOSIS — I4891 Unspecified atrial fibrillation: Secondary | ICD-10-CM

## 2012-05-03 MED ORDER — CYANOCOBALAMIN 1000 MCG/ML IJ SOLN
1000.0000 ug | Freq: Once | INTRAMUSCULAR | Status: AC
  Administered 2012-05-03: 1000 ug via INTRAMUSCULAR

## 2012-05-06 ENCOUNTER — Ambulatory Visit (INDEPENDENT_AMBULATORY_CARE_PROVIDER_SITE_OTHER): Payer: Medicaid Other | Admitting: *Deleted

## 2012-05-06 DIAGNOSIS — E538 Deficiency of other specified B group vitamins: Secondary | ICD-10-CM

## 2012-05-06 MED ORDER — CYANOCOBALAMIN 1000 MCG/ML IJ SOLN
1000.0000 ug | Freq: Once | INTRAMUSCULAR | Status: AC
  Administered 2012-05-06: 1000 ug via INTRAMUSCULAR

## 2012-05-10 ENCOUNTER — Ambulatory Visit (INDEPENDENT_AMBULATORY_CARE_PROVIDER_SITE_OTHER): Payer: Medicaid Other | Admitting: *Deleted

## 2012-05-10 MED ORDER — CYANOCOBALAMIN 1000 MCG/ML IJ SOLN
1000.0000 ug | Freq: Once | INTRAMUSCULAR | Status: AC
  Administered 2012-05-10: 1000 ug via INTRAMUSCULAR

## 2012-05-13 ENCOUNTER — Ambulatory Visit (INDEPENDENT_AMBULATORY_CARE_PROVIDER_SITE_OTHER): Payer: Medicaid Other | Admitting: *Deleted

## 2012-05-13 DIAGNOSIS — E538 Deficiency of other specified B group vitamins: Secondary | ICD-10-CM

## 2012-05-13 MED ORDER — CYANOCOBALAMIN 1000 MCG/ML IJ SOLN
1000.0000 ug | Freq: Once | INTRAMUSCULAR | Status: AC
  Administered 2012-05-13: 1000 ug via INTRAMUSCULAR

## 2012-05-20 ENCOUNTER — Encounter: Payer: Self-pay | Admitting: Internal Medicine

## 2012-05-20 ENCOUNTER — Ambulatory Visit: Payer: Medicaid Other

## 2012-05-20 ENCOUNTER — Ambulatory Visit (INDEPENDENT_AMBULATORY_CARE_PROVIDER_SITE_OTHER): Payer: Medicaid Other | Admitting: *Deleted

## 2012-05-20 MED ORDER — CYANOCOBALAMIN 1000 MCG/ML IJ SOLN
1000.0000 ug | Freq: Once | INTRAMUSCULAR | Status: AC
  Administered 2012-05-20: 1000 ug via INTRAMUSCULAR

## 2012-05-20 NOTE — Progress Notes (Signed)
Patient ID: [name], female   DOB: [birth year], 77 y.o.   MRN: [id number]  [name] completed her biweekly B12 injections with last injection on 05/20/12. She will not require another B12 injection until next month, around April 9th or 11th (she has HD on April 10th).

## 2012-05-21 ENCOUNTER — Ambulatory Visit: Payer: Medicaid Other | Admitting: Cardiovascular Disease

## 2012-05-24 ENCOUNTER — Ambulatory Visit: Payer: Medicaid Other | Admitting: Cardiovascular Disease

## 2012-05-27 ENCOUNTER — Ambulatory Visit: Payer: Medicaid Other

## 2012-06-03 ENCOUNTER — Ambulatory Visit: Payer: Medicaid Other

## 2012-06-04 NOTE — Progress Notes (Signed)
This encounter was created in error - please disregard.

## 2012-06-06 ENCOUNTER — Other Ambulatory Visit: Payer: Self-pay | Admitting: *Deleted

## 2012-06-06 IMAGING — CR DG CHEST 1V PORT
1 series · 1 of 1 positions shown · non-contrast
Comparison: Chest x-ray of 04/14/2010

CLINICAL DATA: Dialysis today, mid chest pain

PORTABLE CHEST - 1 VIEW

[view not recorded]
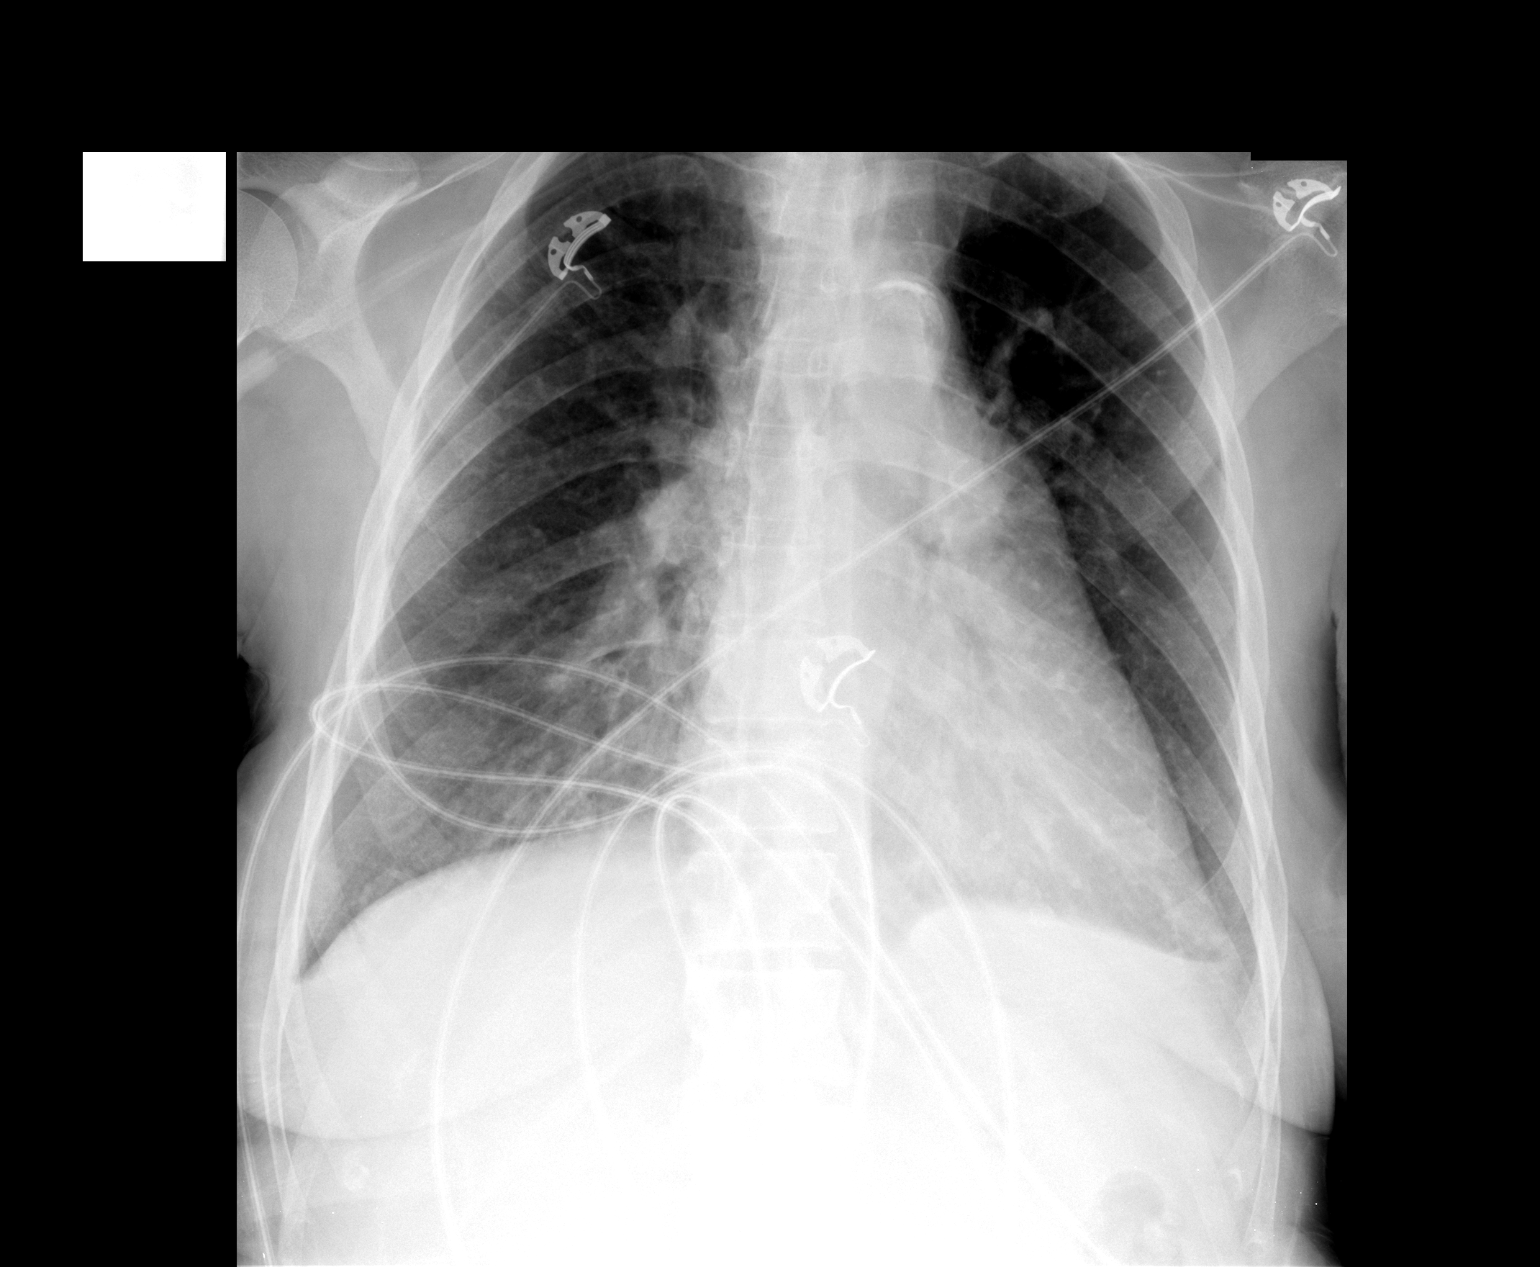

[1 of 1 positions shown; findings below may reference images not displayed]

FINDINGS: Cardiomegaly is again noted.  No definite effusion is
seen.  There may be very minimal pulmonary vascular congestion
present.  No focal infiltrate is noted.
IMPRESSION: Cardiomegaly.  Question minimal pulmonary vascular congestion.

## 2012-06-06 MED ORDER — AMIODARONE HCL 100 MG PO TABS: 100.0000 mg | ORAL_TABLET | Freq: Two times a day (BID) | ORAL | Status: DC

## 2012-06-07 ENCOUNTER — Encounter: Payer: Self-pay | Admitting: Internal Medicine

## 2012-06-07 ENCOUNTER — Other Ambulatory Visit: Payer: Self-pay | Admitting: *Deleted

## 2012-06-07 ENCOUNTER — Telehealth: Payer: Self-pay | Admitting: *Deleted

## 2012-06-07 ENCOUNTER — Ambulatory Visit (INDEPENDENT_AMBULATORY_CARE_PROVIDER_SITE_OTHER): Payer: Medicaid Other | Admitting: Internal Medicine

## 2012-06-07 VITALS — BP 172/66 | HR 60 | Temp 97.9°F | Ht 62.0 in | Wt 129.6 lb

## 2012-06-07 DIAGNOSIS — E538 Deficiency of other specified B group vitamins: Secondary | ICD-10-CM

## 2012-06-07 DIAGNOSIS — M549 Dorsalgia, unspecified: Secondary | ICD-10-CM

## 2012-06-07 DIAGNOSIS — M545 Low back pain, unspecified: Secondary | ICD-10-CM

## 2012-06-07 DIAGNOSIS — I4891 Unspecified atrial fibrillation: Secondary | ICD-10-CM

## 2012-06-07 DIAGNOSIS — N186 End stage renal disease: Secondary | ICD-10-CM

## 2012-06-07 DIAGNOSIS — I129 Hypertensive chronic kidney disease with stage 1 through stage 4 chronic kidney disease, or unspecified chronic kidney disease: Secondary | ICD-10-CM

## 2012-06-07 MED ORDER — HYDROCODONE-ACETAMINOPHEN 5-325 MG PO TABS: 1.0000 | ORAL_TABLET | Freq: Four times a day (QID) | ORAL | Status: DC | PRN

## 2012-06-07 MED ORDER — AMIODARONE HCL 200 MG PO TABS: 100.0000 mg | ORAL_TABLET | Freq: Two times a day (BID) | ORAL | Status: DC

## 2012-06-07 NOTE — Addendum Note (Signed)
Addended by: Iona Coach on: 06/07/2012 02:20 PM   Modules accepted: Orders

## 2012-06-07 NOTE — Assessment & Plan Note (Signed)
BP elevated to 172/66 today but she tells me her BP drops in HD. BP actively managed by Cards and Nephrology.

## 2012-06-07 NOTE — Telephone Encounter (Signed)
I called the pt's insurance to obtain prior authorization for Amiodarone and they made me aware that the 100mg  tablet is not covered.  The 200mg  tablet is preferred.  I called the pharmacy and switched the pt's Amiodarone to 200mg  take one-half tablet by mouth twice a day #30, RF x 11. I spoke with the pt's husband and made him aware of the new instructions.

## 2012-06-07 NOTE — Assessment & Plan Note (Addendum)
Per lumbar spine 4 view Xray on 04/15/12: No acute findings, bl lower lumbar facet DJD at L5-S1.  She has tried heating pad and Tylenol with minimum improvement of her pain. She was given Rx for Norco but has not taken this medication yet.  Toradol is contraindicated for her as well as other NSAIDs given her ESRD.  -Rx of Norco 5-325mg  1-2 tablets q 6hr #60. She was instructed to take this only as needed. Side effects of this medication including dizziness and low BP were discussed in detail; she was instructed to start taking this medicine today, on a non-dialysis day to prevent sudden decrease in BP.  -Referral to Outpatient Physical Therapy--[address] -Pt instructed to go to ED if her back pain becomes severe or if she develops bowel/bladder incontinence.  -Pt will follow up with [place] as needed for pain control but may benefit from Pain clinic referral in the future--possible referral discussed with the patient and her husband.    Pt discussed with Dr. Dalphine Handing

## 2012-06-07 NOTE — Progress Notes (Signed)
  Subjective:    Patient ID: [name], female    DOB: May 03, 1935, 77 y.o.   MRN: [id number]  HPI [name] is a pleasant 77 year old woman with PMH of ESRD on HD on T/Th/Sat, A.fib on anticoagulation with warfarin, hyperthyroidism, and HTN who comes in for evaluation of low back pain that has been present for almost two months now. She has tried Tylenol and heating pad with minimum improvement of her pain. At its worse the pain is 8/10, but mostly a 6/10. The pain is described as constant ache on her lower back that radiates to her sides and sometimes to her left thigh. She denies numbness or tingling of her legs, bowel or bladder incontinence, or gait unsteadiness. She denies falls. She notes that lying down flat worsens the pain but walking improves the pain. The pais is usually at its worse at night when she lays in bed to sleep and during her dialysis sessions after lying down for hours.  Five years ago she had similar pain that improved with one IM injection of Toradol 60mg  and she and her husband would like her to have a Toradol injection today.    Review of Systems  Constitutional: Negative for fever, chills, diaphoresis, activity change, appetite change, fatigue and unexpected weight change.  Respiratory: Negative for cough and chest tightness.   Cardiovascular: Negative for chest pain, palpitations and leg swelling.  Gastrointestinal: Negative for nausea, vomiting and abdominal pain.  Genitourinary: Negative for frequency, flank pain and difficulty urinating.  Musculoskeletal: Positive for back pain. Negative for myalgias, joint swelling, arthralgias and gait problem.  Skin: Negative for rash.  Hematological: Negative for adenopathy. Bruises/bleeds easily.       On coumadin  Psychiatric/Behavioral: Negative for behavioral problems and agitation.       Objective:   Physical Exam  Nursing note and vitals reviewed. Constitutional: She is oriented to person, place, and time.  She appears well-developed and well-nourished. No distress.  HENT:  Head: Normocephalic and atraumatic.  Eyes: Conjunctivae are normal. Right eye exhibits no discharge. Left eye exhibits no discharge. No scleral icterus.  Neck: Normal range of motion. Neck supple.  Cardiovascular: Normal rate.   Pulmonary/Chest: Effort normal. No respiratory distress.  Musculoskeletal: Normal range of motion. She exhibits no edema and no tenderness.  Mild lumbar lordosis. No paraspinal tenderness. Normal hip flexion, extension, and rotation. Negative straight leg raise. Strength 5/5 for hip flexion/enxtention, left lower leg extension 4/5 compared to right which is chronic. Normal reflexes. Normal gait.    Neurological: She is alert and oriented to person, place, and time. She has normal reflexes. Coordination normal.  Skin: Skin is warm and dry. No rash noted. She is not diaphoretic. No erythema. No pallor.  Psychiatric: She has a normal mood and affect. Her behavior is normal.          Assessment & Plan:

## 2012-06-07 NOTE — Telephone Encounter (Signed)
Error,i did not meant to opper this encounter.

## 2012-06-07 NOTE — Patient Instructions (Addendum)
General Instructions: -Take Vicodin, 1-2 tablets as needed for pain. You may need only one tablet at a time.  -Vicodin can cause low blood pressure and dizziness. Please be very carefull to prevent falls when taking this medication.  -You were referred to physical therapy, outpatient, at [facility]. It might take one to two weeks until they contact you with an initial appointment.  -Continue avoiding Ibuprofen, Aleeve, or NSAIDs -It was great seeing you today!    Treatment Goals:  -BP less than 150/90 -Pain control  Progress Toward Treatment Goals:  Treatment Goal 06/07/2012  Blood pressure unchanged    Self Care Goals & Plans:  Self Care Goal 06/07/2012  Manage my medications take my medicines as prescribed; bring my medications to every visit; refill my medications on time  Monitor my health keep track of my blood glucose  Eat healthy foods eat foods that are low in salt  Be physically active find an activity I enjoy       Care Management & Community Referrals:  Referral 06/07/2012  Referrals made for care management support none needed

## 2012-06-07 NOTE — Assessment & Plan Note (Signed)
On warfarin, managed by Cards

## 2012-06-07 NOTE — Telephone Encounter (Signed)
Pharmacy calling to see if the Prior Authorization has been Authorized for patient.s Amiodarone 100mg . Patient will be out of medication this weekend and needs this refilled by 4pm. Will forward to Nurse.

## 2012-06-07 NOTE — Assessment & Plan Note (Signed)
Next B12 injection on April 9th or 11th.

## 2012-06-10 ENCOUNTER — Ambulatory Visit (INDEPENDENT_AMBULATORY_CARE_PROVIDER_SITE_OTHER): Payer: Medicaid Other | Admitting: Cardiovascular Disease

## 2012-06-10 ENCOUNTER — Ambulatory Visit: Payer: Medicaid Other

## 2012-06-10 ENCOUNTER — Ambulatory Visit (INDEPENDENT_AMBULATORY_CARE_PROVIDER_SITE_OTHER): Payer: Medicaid Other | Admitting: Pharmacist

## 2012-06-10 ENCOUNTER — Encounter: Payer: Medicaid Other | Admitting: Internal Medicine

## 2012-06-10 ENCOUNTER — Encounter: Payer: Self-pay | Admitting: Cardiovascular Disease

## 2012-06-10 VITALS — BP 182/64 | HR 58 | Ht 62.0 in | Wt 130.0 lb

## 2012-06-10 DIAGNOSIS — I4891 Unspecified atrial fibrillation: Secondary | ICD-10-CM

## 2012-06-10 LAB — HEPATIC FUNCTION PANEL
ALT: 19 U/L (ref 0–35)
AST: 21 U/L (ref 0–37)
Alkaline Phosphatase: 75 U/L (ref 39–117)
Total Bilirubin: 0.6 mg/dL (ref 0.3–1.2)

## 2012-06-10 LAB — POCT INR: INR: 3.3

## 2012-06-10 NOTE — Patient Instructions (Signed)
Your physician recommends that you have lab work today: LIVER  Your physician wants you to follow-up in: 6 MONTHS with Dr Excell Seltzer.  You will receive a reminder letter in the mail two months in advance. If you don't receive a letter, please call our office to schedule the follow-up appointment.  Your physician recommends that you continue on your current medications as directed. Please refer to the Current Medication list given to you today.

## 2012-06-10 NOTE — Progress Notes (Signed)
HPI:  77 year old woman presenting for followup evaluation. She is followed for paroxysmal atrial fibrillation and malignant hypertension. She has end-stage renal disease. Medical treatment is always been complicated by multiple drug intolerances. Remarkably, she's done fairly well on amiodarone. Office blood pressure readings are generally elevated, but her blood pressure drops during hemodialysis.  She's felt short of breath the past few mornings after taking generic amiodarone. She thinks it is medication related and requests brand name Pacerone. We have been working on this and it is currently under review with her insurance company.  She denies palpitations, edema, lightheadedness, or syncope. She's been working on some changes at the dialysis center so that she can better tolerate hemodialysis. They interrupt her session when her blood pressure drops below 140.  Outpatient Encounter Prescriptions as of 06/10/2012  Medication Sig Dispense Refill  . amiodarone (PACERONE) 200 MG tablet Take 0.5 tablets (100 mg total) by mouth 2 (two) times daily.  30 tablet  11  . cyanocobalamin (,VITAMIN B-12,) 1000 MCG/ML injection Inject 1 mL (1,000 mcg total) into the muscle once.  1 mL  0  . felodipine (PLENDIL) 2.5 MG 24 hr tablet Take 1 tablet (2.5 mg total) by mouth daily.  30 tablet  11  . HYDROcodone-acetaminophen (NORCO/VICODIN) 5-325 MG per tablet Take 1-2 tablets by mouth every 6 (six) hours as needed for pain.  60 tablet  0  . iron polysaccharides (NIFEREX) 150 MG capsule Take 150 mg by mouth 2 (two) times daily.      Marland Kitchen latanoprost (XALATAN) 0.005 % ophthalmic solution Place 1 drop into both eyes at bedtime.      Marland Kitchen levothyroxine (SYNTHROID, LEVOTHROID) 112 MCG tablet Take 112 mcg by mouth every morning. Take on an empty stomach      . ranitidine (ZANTAC) 150 MG tablet Take 150 mg by mouth at bedtime.      Marland Kitchen warfarin (COUMADIN) 5 MG tablet Take as directed by coumadin clinic  30 tablet  3  .  [DISCONTINUED] calcium acetate (PHOSLO) 667 MG capsule Take 667 mg by mouth 3 (three) times daily before meals.      . [DISCONTINUED] cyanocobalamin (,VITAMIN B-12,) 1000 MCG/ML injection Inject 1 mL (1,000 mcg total) into the muscle once.  1 mL  0   No facility-administered encounter medications on file as of 06/10/2012.    Allergies  Allergen Reactions  . Cardizem (Diltiazem Hcl) Hives  . Codeine Other (See Comments)    dizziness  . Iron     ?? Almost died.  Dennie Maizes (Amiodarone) Other (See Comments)    Visual changes with generic Amiodarone, the pt can only take brand name Pacerone  . Rena-Vite (B-Plex) Other (See Comments)    Unknown reaction  . Vancomycin Other (See Comments)    dizziness  . Venofer (Iron Sucrose (Iron Oxide Saccharated)) Itching  . Latex Itching and Rash    Past Medical History  Diagnosis Date  . ESRD (end stage renal disease)     HD for 1 year, HD on Tues, Thurs, Sat // Likely secondary to nephrosclerosis from HTN  . HTN (hypertension)   . HLD (hyperlipidemia)   . Cholelithiases     S/P biliary colic cholecystostomy, but not cholecystectomy secondary to omental adhesions  . Anxiety   . Anemia     BL Hgb 8-9. Likely AOCD in setting of ESRD with iron 48, ferritin  746 (03/2010)  . PAF (paroxysmal atrial fibrillation)     On chronic coumadin therapy //  Followed by Dr. Excell Seltzer (cardiology)  . Non-ST elevation MI (NSTEMI)     Noninvasive evaluation with Myoview negative for ischemia.  // 2-D echo (05/2010) LVEF 65-70%. Grade 1 diastolic dysfunction.  Peak PA pressure 52.  Marland Kitchen Hyperthyroidism DX: 07/2010    Likely Grave's Disease. // On diagnosis, TSH < 0.08, free T4 1.94, free T3 5.3 . //  Thyroid US (07/17/2010) - nonspecific diffuse heterogenous thyroid gland. //  Thyrotropin receptor antibody neg (07/17/2010)  . Atrial flutter   . Anticoagulant long-term use     ROS: Negative except as per HPI  BP 182/64  Pulse 58  Ht 5\' 2"  (1.575 m)  Wt 130 lb  (58.968 kg)  BMI 23.77 kg/m2  SpO2 95%  PHYSICAL EXAM: Pt is alert and oriented, pleasant elderly woman in NAD HEENT: normal Neck: JVP - normal, carotids 2+= without bruits Lungs: CTA bilaterally CV: RRR without murmur or gallop Abd: soft, NT, Positive BS, no hepatomegaly Ext: no C/C/E, distal pulses intact and equal Skin: warm/dry no rash  EKG:  Normal sinus rhythm 58 beats per minute, within normal limits.  ASSESSMENT AND PLAN: 1. Paroxysmal atrial fibrillation. The patient is maintaining sinus rhythm. We're trying to get her brand name Pacerone. She takes 100 mg twice daily. Thyroid function studies are followed by endocrinology. Her last LFTs were about 6 months ago we will repeat this. She had a chest x-ray within the past year. She will continue on warfarin for anticoagulation.  2. Malignant hypertension. Difficult balance as hypotension is a major problem with dialysis. She also has multiple drug intolerances and has been tried on just about every antihypertensive medication without success. Will continue her current program.  For followup I would like to see her back in 6 months.  Tonny Bollman 06/10/2012 12:19 PM

## 2012-06-13 ENCOUNTER — Encounter: Payer: Self-pay | Admitting: Cardiovascular Disease

## 2012-06-13 NOTE — Telephone Encounter (Signed)
New Prob    Pt states he just spoke to you and made a mistake. Requesting refill to be sent to [facility] instead of [facility].

## 2012-06-13 NOTE — Telephone Encounter (Signed)
This encounter was created in error - please disregard.

## 2012-06-21 ENCOUNTER — Ambulatory Visit: Payer: Medicaid Other

## 2012-06-24 ENCOUNTER — Ambulatory Visit: Payer: Medicaid Other

## 2012-06-25 ENCOUNTER — Telehealth: Payer: Self-pay | Admitting: Cardiovascular Disease

## 2012-06-25 ENCOUNTER — Ambulatory Visit (INDEPENDENT_AMBULATORY_CARE_PROVIDER_SITE_OTHER): Payer: Medicaid Other | Admitting: *Deleted

## 2012-06-25 DIAGNOSIS — E538 Deficiency of other specified B group vitamins: Secondary | ICD-10-CM

## 2012-06-25 MED ORDER — CYANOCOBALAMIN 1000 MCG/ML IJ SOLN
1000.0000 ug | Freq: Once | INTRAMUSCULAR | Status: AC
  Administered 2012-06-25: 1000 ug via INTRAMUSCULAR

## 2012-06-25 NOTE — Telephone Encounter (Signed)
Left message on machine for pt to contact the office.   

## 2012-06-25 NOTE — Telephone Encounter (Signed)
New Problem:    Patient's husband called in because his wife's prescription was not refilled properly.  Please call back.

## 2012-06-26 NOTE — Telephone Encounter (Signed)
Follow-up:     Patient called in returning Lauren's call.  Please call back.

## 2012-06-26 NOTE — Telephone Encounter (Signed)
Spoke with pt's husband. Prescription he picked up from pharmacy is for Pacerone 100 mg by mouth daily and bottle contains 30 tablets. He states pt takes 100 mg twice daily. Last office note reviewed and pt taking Pacerone 100 mg twice daily. She needs brand name. On 3/28 refill was called in to pt's pharmacy for Pacerone 200 mg tablets with instructions to take 0.5 tablets (100 mg total) by mouth 2 times daily. I told pt's husband I would call pharmacy to clarify. I spoke with pharmacist at [facility] and they have prescription sent from our office on 3/28. They had to change to 100 mg twice daily as the 200 mg tablets were not available. Per pharmacist prescription was filled incorrectly and they have correct prescription ready for pt to pick up.  Pharmacist will contact pt with this information.

## 2012-07-01 ENCOUNTER — Encounter: Payer: Self-pay | Admitting: *Deleted

## 2012-07-01 ENCOUNTER — Ambulatory Visit (INDEPENDENT_AMBULATORY_CARE_PROVIDER_SITE_OTHER): Payer: Medicaid Other | Admitting: Pharmacist

## 2012-07-01 DIAGNOSIS — I4891 Unspecified atrial fibrillation: Secondary | ICD-10-CM

## 2012-07-01 LAB — POCT INR: INR: 3.9

## 2012-07-02 ENCOUNTER — Encounter: Payer: Self-pay | Admitting: *Deleted

## 2012-07-10 ENCOUNTER — Telehealth: Payer: Self-pay | Admitting: *Deleted

## 2012-07-10 NOTE — Telephone Encounter (Signed)
Pt's husband here at the clinic requesting a letter to be faxed to Dialysis re: stated Dr Garald Braver told them pt should not be lying flat d/t back pain; She has to be an upright position.  He husband said he told the person at Dialysis and was told they needed a letter stating this from her doctor. He wants a copy of the letter also; he will pick it up.  Thanks

## 2012-07-12 NOTE — Telephone Encounter (Signed)
Spoke with Dialysis Geneticist, molecular. She informed me that Ms. Sprankle sits up in a 90 degree angle but her feet are elevated during dialysis. I requested that her chart be noted that her back is maintained in a 90 degree angle as much as possible to prevent worsening of her back pain. She agreed.   Called Mr. Renner, he informed me that Mrs. Engert's dialysis chair is not working well and starts to recline during the dialysis session. He tells me that he has complained about the "broken" chair multiple time but they have not addresses his concerns. I explained to him that a letter stating that she needed to have her back in a 90 degree angle may not help him in getting the chair repaired or replaced. I told him I would contact the dialysis center.   I called the dialysis center multiple times (after 5PM) and nobody answered. Will attempt to call again tomorrow.

## 2012-07-12 NOTE — Telephone Encounter (Signed)
Pt's states she goes to the dialysis ctr on [address] [phone].

## 2012-07-13 NOTE — ED Notes (Deleted)
Called the Dialysis Center re dialysis chair. Spoke with Percival Spanish, Diplomatic Services operational officer. They state that all chairs at the center have a little recline and are not adjusted to a 90 degree angle. They provided a new chair for [name] today and encouraged her to bring more pillows to keep in a more sitting up position.   Will encourage patient to use lumbar support wedge/pillows during her dialysis sessions     Ky Barban, MD 07/13/12 1531

## 2012-07-16 ENCOUNTER — Ambulatory Visit (INDEPENDENT_AMBULATORY_CARE_PROVIDER_SITE_OTHER): Payer: Medicaid Other | Admitting: *Deleted

## 2012-07-16 DIAGNOSIS — I4891 Unspecified atrial fibrillation: Secondary | ICD-10-CM

## 2012-07-22 ENCOUNTER — Ambulatory Visit: Payer: Medicaid Other

## 2012-07-23 ENCOUNTER — Encounter: Payer: Self-pay | Admitting: *Deleted

## 2012-07-24 ENCOUNTER — Ambulatory Visit (INDEPENDENT_AMBULATORY_CARE_PROVIDER_SITE_OTHER): Payer: Medicaid Other | Admitting: *Deleted

## 2012-07-24 DIAGNOSIS — E538 Deficiency of other specified B group vitamins: Secondary | ICD-10-CM

## 2012-07-24 MED ORDER — CYANOCOBALAMIN 1000 MCG/ML IJ SOLN
1000.0000 ug | Freq: Once | INTRAMUSCULAR | Status: AC
  Administered 2012-07-24: 1000 ug via INTRAMUSCULAR

## 2012-07-30 ENCOUNTER — Ambulatory Visit (INDEPENDENT_AMBULATORY_CARE_PROVIDER_SITE_OTHER): Payer: Medicaid Other | Admitting: *Deleted

## 2012-07-30 ENCOUNTER — Telehealth: Payer: Self-pay | Admitting: *Deleted

## 2012-07-30 DIAGNOSIS — I4891 Unspecified atrial fibrillation: Secondary | ICD-10-CM

## 2012-07-30 NOTE — Telephone Encounter (Signed)
Pt's spouse calls and states she does not want to go to PT and wants dr Garald Braver to cancel PT

## 2012-08-01 NOTE — Addendum Note (Signed)
Addended by: Neomia Dear on: 08/01/2012 12:41 PM   Modules accepted: Orders

## 2012-08-07 NOTE — Telephone Encounter (Signed)
Hi [name],    Since the patient does not want PT, they are allowed to stop it. Has the patient been contacted in regards to this request on 5/20? Let me know if I need to call the patient.   Thanks!  SK

## 2012-08-09 ENCOUNTER — Ambulatory Visit (INDEPENDENT_AMBULATORY_CARE_PROVIDER_SITE_OTHER): Payer: Self-pay | Admitting: Cardiovascular Disease

## 2012-08-09 DIAGNOSIS — I4891 Unspecified atrial fibrillation: Secondary | ICD-10-CM

## 2012-08-14 NOTE — Telephone Encounter (Signed)
stopped

## 2012-08-18 IMAGING — CR DG CHEST 1V PORT
1 series · 1 of 1 positions shown · non-contrast
Comparison: 07/16/2010

CLINICAL DATA: Pain.

PORTABLE CHEST - 1 VIEW

[AP]
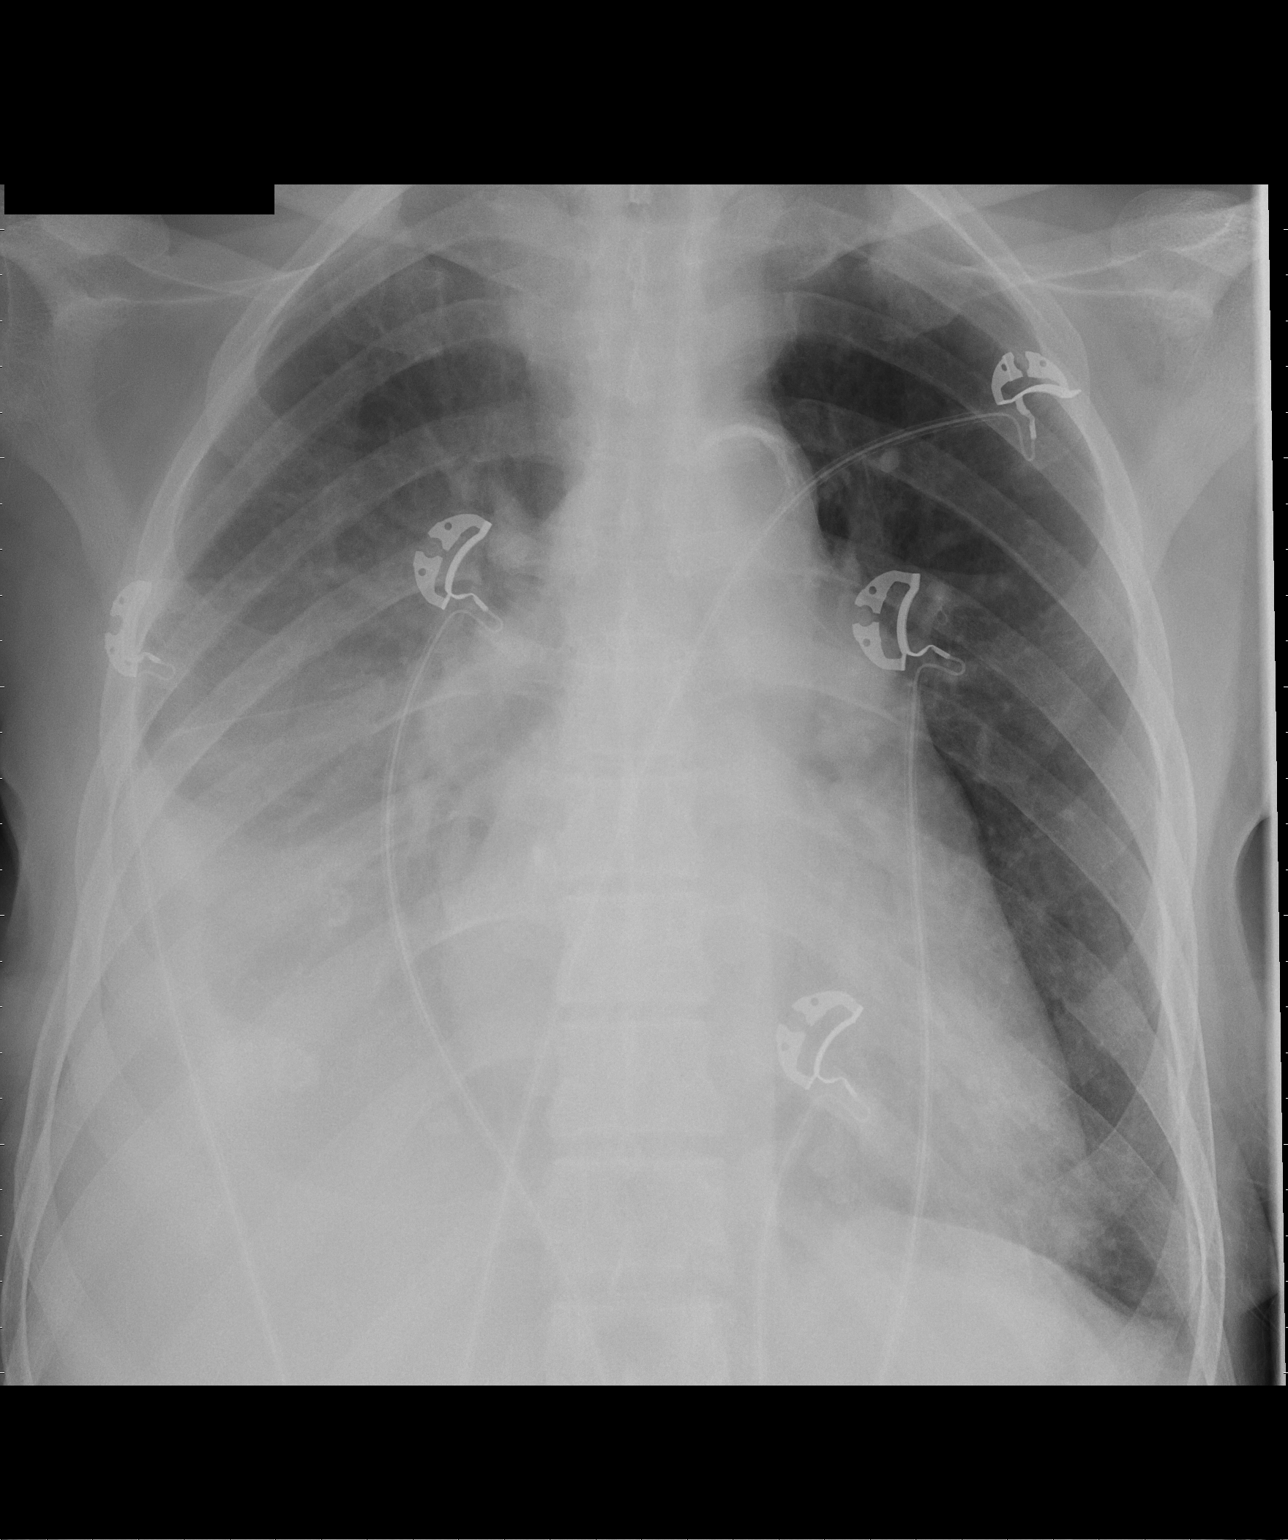

[1 of 1 positions shown; findings below may reference images not displayed]

FINDINGS: Mild cardiac enlargement and pulmonary vascular
prominence similar to prior study.  Interval development of
opacification of the right lower lung suggesting developing right
pleural effusion and basilar consolidation.  Changes are consistent
with pneumonia in the appropriate clinical setting.  Calcified
aorta.
IMPRESSION: Interval development of diffuse opacity over the right lower chest
consistent with pleural effusion and basilar atelectasis /
consolidation.

## 2012-08-20 ENCOUNTER — Ambulatory Visit (INDEPENDENT_AMBULATORY_CARE_PROVIDER_SITE_OTHER): Payer: Medicaid Other

## 2012-08-20 DIAGNOSIS — I4891 Unspecified atrial fibrillation: Secondary | ICD-10-CM

## 2012-08-22 ENCOUNTER — Telehealth: Payer: Self-pay | Admitting: *Deleted

## 2012-08-23 ENCOUNTER — Ambulatory Visit: Payer: Medicaid Other

## 2012-08-26 ENCOUNTER — Ambulatory Visit (INDEPENDENT_AMBULATORY_CARE_PROVIDER_SITE_OTHER): Payer: Medicaid Other | Admitting: *Deleted

## 2012-08-26 DIAGNOSIS — E538 Deficiency of other specified B group vitamins: Secondary | ICD-10-CM

## 2012-08-26 MED ORDER — CYANOCOBALAMIN 1000 MCG/ML IJ SOLN
1000.0000 ug | Freq: Once | INTRAMUSCULAR | Status: AC
  Administered 2012-08-26: 1000 ug via INTRAMUSCULAR

## 2012-08-26 NOTE — Telephone Encounter (Signed)
Contacted dialysis center at [phone], they only give vaccines the last hour of dialysis, they use the prevnar 13 vaccine. Will forward to pcp to see if 1 hour is enough time to see if pt has a reaction to vaccine.  Of note, pt received a b12 injection today and wanted to know if this was her last one, will forward to pcp for review, please advise.Lauren Spittle Cassady6/16/20144:53 PM

## 2012-08-26 NOTE — Telephone Encounter (Signed)
Could she come in to the clinic in the morning and stay in the clinic for observation? If so we could keep her for 3-6 hours then we could give her the vaccine.

## 2012-08-26 NOTE — Telephone Encounter (Signed)
Pt already scheduled for b12 injection today and will obtain pneumovax at this time .Kingsley Spittle Cassady6/16/20143:04 PM

## 2012-08-26 NOTE — Telephone Encounter (Signed)
Dr Garald Braver,  Pt's husband walked in last week on 08/22/12 and wanted to know if it was ok for her to get a pneumovax.  Stated that Nephrologist recommended it, but he  wanted to get approval from you since pt is allergic to and/or has side effects to many different medications. Please advise.Kingsley Spittle Cassady6/16/20149:54 AM

## 2012-08-26 NOTE — Telephone Encounter (Signed)
Pneumovax is recommended. They may come in at any time with a period of observation after vaccine injection. I will need your help with the order. Thanks! SK

## 2012-08-26 NOTE — Telephone Encounter (Signed)
Pharmacist called and he recommends pt be observed for 3 - 6 hours after pneumovax vaccine. This will cover acute or delayed reaction.

## 2012-09-10 ENCOUNTER — Ambulatory Visit (INDEPENDENT_AMBULATORY_CARE_PROVIDER_SITE_OTHER): Payer: Medicaid Other | Admitting: *Deleted

## 2012-09-10 DIAGNOSIS — I4891 Unspecified atrial fibrillation: Secondary | ICD-10-CM

## 2012-10-04 ENCOUNTER — Other Ambulatory Visit: Payer: Self-pay | Admitting: Cardiovascular Disease

## 2012-10-08 ENCOUNTER — Ambulatory Visit (INDEPENDENT_AMBULATORY_CARE_PROVIDER_SITE_OTHER): Payer: Medicaid Other | Admitting: *Deleted

## 2012-10-08 DIAGNOSIS — I4891 Unspecified atrial fibrillation: Secondary | ICD-10-CM

## 2012-11-05 ENCOUNTER — Ambulatory Visit (INDEPENDENT_AMBULATORY_CARE_PROVIDER_SITE_OTHER): Payer: Medicaid Other | Admitting: *Deleted

## 2012-11-05 DIAGNOSIS — I4891 Unspecified atrial fibrillation: Secondary | ICD-10-CM

## 2012-11-05 LAB — POCT INR: INR: 2.6

## 2012-11-22 ENCOUNTER — Telehealth: Payer: Self-pay | Admitting: Cardiovascular Disease

## 2012-11-26 NOTE — Telephone Encounter (Signed)
Patients husband came to office to speak again about medication Pacerone,  I gave him information regarding denial from medicaid, I told him I would call again and open new prior authorization, attempt again reinforcing sensitivity to medications and the side effects she had with the amiodarone.

## 2012-11-26 NOTE — Telephone Encounter (Signed)
Medicaid DENIED payment for brandname PACERONE,  PACERONE is a non preferred anti arrhythmic, quidelines states trial and failure of 2 preferred agents are required unless otherwise indicated, spoke with [name] reference [phone]. Patient has side effects from amiodarone and unable to take.  Medications on preferred list are amiodarone Disopyramide flecanide Mexiletine Propafenone Propafenone SR Quinidine gluconate Quinidine sulfate Quinidine ER tambocor

## 2012-11-26 NOTE — Telephone Encounter (Signed)
Called [place] medicaid track and spoke with Tiffany regarding new prior auth sent in for pts Pacerone, it was approved for 1 year #[id number], notified pts husband and pharmacy.

## 2012-11-26 NOTE — Telephone Encounter (Signed)
New Problem  States he needs refill// has already spoke with our medications department// states he needs a new Rx.

## 2012-11-27 ENCOUNTER — Telehealth: Payer: Self-pay | Admitting: Cardiovascular Disease

## 2012-11-27 NOTE — Telephone Encounter (Signed)
Walk In Pt Form " Needs to Speak with Lauren about Wife Meds" gave to Lauren 11/27/12/KM

## 2012-11-28 ENCOUNTER — Encounter: Payer: Self-pay | Admitting: Cardiovascular Disease

## 2012-11-28 ENCOUNTER — Ambulatory Visit (INDEPENDENT_AMBULATORY_CARE_PROVIDER_SITE_OTHER): Payer: Medicaid Other | Admitting: Cardiovascular Disease

## 2012-11-28 ENCOUNTER — Ambulatory Visit (INDEPENDENT_AMBULATORY_CARE_PROVIDER_SITE_OTHER): Payer: Medicaid Other | Admitting: *Deleted

## 2012-11-28 VITALS — BP 186/78 | HR 54 | Ht 63.0 in | Wt 123.0 lb

## 2012-11-28 DIAGNOSIS — I4891 Unspecified atrial fibrillation: Secondary | ICD-10-CM

## 2012-11-28 LAB — POCT INR: INR: 2.3

## 2012-11-28 NOTE — Progress Notes (Signed)
HPI:   77 year old woman presenting for followup evaluation. She has end-stage renal disease. Her cardiac issues include malignant hypertension and paroxysmal atrial fibrillation. She's been intolerant to many blood pressure medications. She has labile swings in her blood pressure and is doing the best she can to manage these. She reports some low blood pressures during and after dialysis. She denies chest pain, shortness of breath, palpitations, or leg swelling. She's lost good portion of her vision and this is her biggest complaint over the last few office visits.  Outpatient Encounter Prescriptions as of 11/28/2012  Medication Sig Dispense Refill  . amiodarone (PACERONE) 200 MG tablet Take 0.5 tablets (100 mg total) by mouth 2 (two) times daily.  30 tablet  11  . cyanocobalamin (,VITAMIN B-12,) 1000 MCG/ML injection Inject 1 mL (1,000 mcg total) into the muscle once.  1 mL  0  . felodipine (PLENDIL) 2.5 MG 24 hr tablet Take 1 tablet (2.5 mg total) by mouth daily.  30 tablet  11  . HYDROcodone-acetaminophen (NORCO/VICODIN) 5-325 MG per tablet Take 1-2 tablets by mouth every 6 (six) hours as needed for pain.  60 tablet  0  . iron polysaccharides (NIFEREX) 150 MG capsule Take 150 mg by mouth 2 (two) times daily.      Marland Kitchen latanoprost (XALATAN) 0.005 % ophthalmic solution Place 1 drop into both eyes at bedtime.      Marland Kitchen levothyroxine (SYNTHROID, LEVOTHROID) 112 MCG tablet Take 112 mcg by mouth every morning. Take on an empty stomach      . ranitidine (ZANTAC) 150 MG tablet Take 150 mg by mouth at bedtime.      Marland Kitchen warfarin (COUMADIN) 5 MG tablet TAKE BY MOUTH AS DIRECTED BY COUMADIN CLINIC  30 tablet  3   No facility-administered encounter medications on file as of 11/28/2012.    Allergies  Allergen Reactions  . Cardizem [Diltiazem Hcl] Hives  . Codeine Other (See Comments)    dizziness  . Iron     ?? Almost died.  Dennie Maizes [Amiodarone] Other (See Comments)    Visual changes with generic  Amiodarone, the pt can only take brand name Pacerone  . Rena-Vite [B-Plex] Other (See Comments)    Unknown reaction  . Vancomycin Other (See Comments)    dizziness  . Venofer [Iron Sucrose (Iron Oxide Saccharated)] Itching  . Latex Itching and Rash    Past Medical History  Diagnosis Date  . ESRD (end stage renal disease)     HD for 1 year, HD on Tues, Thurs, Sat // Likely secondary to nephrosclerosis from HTN  . HTN (hypertension)   . HLD (hyperlipidemia)   . Cholelithiases     S/P biliary colic cholecystostomy, but not cholecystectomy secondary to omental adhesions  . Anxiety   . Anemia     BL Hgb 8-9. Likely AOCD in setting of ESRD with iron 48, ferritin  746 (03/2010)  . PAF (paroxysmal atrial fibrillation)     On chronic coumadin therapy // Followed by Dr. Excell Seltzer (cardiology)  . Non-ST elevation MI (NSTEMI)     Noninvasive evaluation with Myoview negative for ischemia.  // 2-D echo (05/2010) LVEF 65-70%. Grade 1 diastolic dysfunction.  Peak PA pressure 52.  Marland Kitchen Hyperthyroidism DX: 07/2010    Likely Grave's Disease. // On diagnosis, TSH < 0.08, free T4 1.94, free T3 5.3 . //  Thyroid US (07/17/2010) - nonspecific diffuse heterogenous thyroid gland. //  Thyrotropin receptor antibody neg (07/17/2010)  . Atrial flutter   .  Anticoagulant long-term use     ROS: Negative except as per HPI  BP 186/78  Pulse 54  Ht 5\' 3"  (1.6 m)  Wt 123 lb (55.792 kg)  BMI 21.79 kg/m2  SpO2 97%  PHYSICAL EXAM: Pt is alert and oriented, NAD HEENT: normal Neck: JVP - normal Lungs: CTA bilaterally CV: RRR with a holosystolic/diastolic murmur over the right upper sternal border Abd: soft, NT, Positive BS, no hepatomegaly Ext: no C/C/E, distal pulses intact and equal Skin: warm/dry no rash  EKG:  Sinus bradycardia 54 beats per minute, nonspecific ST abnormality.  ASSESSMENT AND PLAN: 1. Paroxysmal atrial fibrillation. The patient is highly symptomatic when she develops atrial fib. She is doing  well on amiodarone. Will continue the same. She is anticoagulated with warfarin. Thyroid studies are followed by endocrinology.  2. Malignant hypertension. She has a multitude of drug intolerances. Blood pressure is labile. I think she is on is much medicine as she can tolerate. She has problems with low blood pressure periodically during dialysis.  For followup I will see her back in 6 months.  Tonny Bollman 11/28/2012 5:30 PM

## 2012-11-28 NOTE — Patient Instructions (Addendum)
Your physician wants you to follow-up in: 6 MONTHS with Dr Cooper.  You will receive a reminder letter in the mail two months in advance. If you don't receive a letter, please call our office to schedule the follow-up appointment.  Your physician recommends that you continue on your current medications as directed. Please refer to the Current Medication list given to you today.  

## 2012-12-02 ENCOUNTER — Ambulatory Visit (INDEPENDENT_AMBULATORY_CARE_PROVIDER_SITE_OTHER): Payer: Medicaid Other | Admitting: Internal Medicine

## 2012-12-02 ENCOUNTER — Encounter: Payer: Self-pay | Admitting: Internal Medicine

## 2012-12-02 VITALS — BP 200/66 | HR 51 | Temp 97.3°F | Wt 126.5 lb

## 2012-12-02 DIAGNOSIS — R29898 Other symptoms and signs involving the musculoskeletal system: Secondary | ICD-10-CM

## 2012-12-02 DIAGNOSIS — R531 Weakness: Secondary | ICD-10-CM

## 2012-12-02 DIAGNOSIS — M545 Low back pain: Secondary | ICD-10-CM

## 2012-12-02 DIAGNOSIS — M549 Dorsalgia, unspecified: Secondary | ICD-10-CM

## 2012-12-02 DIAGNOSIS — E039 Hypothyroidism, unspecified: Secondary | ICD-10-CM

## 2012-12-02 DIAGNOSIS — N186 End stage renal disease: Secondary | ICD-10-CM

## 2012-12-02 DIAGNOSIS — E0590 Thyrotoxicosis, unspecified without thyrotoxic crisis or storm: Secondary | ICD-10-CM

## 2012-12-02 DIAGNOSIS — I129 Hypertensive chronic kidney disease with stage 1 through stage 4 chronic kidney disease, or unspecified chronic kidney disease: Secondary | ICD-10-CM

## 2012-12-02 DIAGNOSIS — E538 Deficiency of other specified B group vitamins: Secondary | ICD-10-CM

## 2012-12-02 MED ORDER — FOLIC ACID 1 MG PO TABS: 1.0000 mg | ORAL_TABLET | Freq: Every day | ORAL | Status: DC

## 2012-12-02 MED ORDER — HYDROCODONE-ACETAMINOPHEN 5-325 MG PO TABS: 1.0000 | ORAL_TABLET | Freq: Four times a day (QID) | ORAL | Status: DC | PRN

## 2012-12-02 NOTE — Patient Instructions (Addendum)
General Instructions: -Follow up in 3 months.  -Ask your Nephrologist about Procrit. -Start taking Folate.   Treatment Goals:  Goals (1 Years of Data) as of 12/02/12         As of Today 11/28/12 06/10/12 06/07/12 04/29/12     Blood Pressure    . Blood Pressure < 140/90  200/66 186/78 182/64 172/66 162/62      Progress Toward Treatment Goals:  Treatment Goal 12/02/2012  Blood pressure at goal    Self Care Goals & Plans:  Self Care Goal 12/02/2012  Manage my medications take my medicines as prescribed  Monitor my health -  Eat healthy foods -  Be physically active -       Care Management & Community Referrals:  Referral 12/02/2012  Referrals made for care management support none needed

## 2012-12-03 DIAGNOSIS — R29898 Other symptoms and signs involving the musculoskeletal system: Secondary | ICD-10-CM | POA: Insufficient documentation

## 2012-12-03 NOTE — Assessment & Plan Note (Addendum)
She has had bilateral lower extremity weakness for months, denies falls. Likely due to deconditioning as she has been very sedentary for almost 2 years due to her bilateral decreased vision. B12 deficiency could also cause bl LE weakness and she does have the hx of vitamin B12 def recently treated.  Her Hg has been stable >10 and Procrit injection will unlikely improve her symptoms. Nonetheless, the pt and her husband were instructed to ask the Nephrologist at the HD center if she is currently receiving Procrit or similar treatment.  She does not want to do outpatient PT as she currently dose not go out much due to her bl LE weakness other than going to there HD center.  -Referred to [facility] PT for initial assessment. The patient was reluctant to undergo PT at first but her husband asked her to at least try it.  -She will return this week for a B12 level.  -Will await results of B12 level and treat if needed with the addition of folic acid supplementation as the pt does not eat leafy green vegetables and may also have folic acid deficiency.

## 2012-12-03 NOTE — Assessment & Plan Note (Signed)
The pain is reported as improving with no bowel/bladder dysfunction but occasional left lateral leg burning sensation. She takes Norco as needed, at most 1 per day, 3 times per week.  Refilled Rx for Norco 5mg -325mg  1-2 tablets q 6 hr #60.

## 2012-12-03 NOTE — Progress Notes (Signed)
  Subjective:    Patient ID: [name], female    DOB: [birth year], 77 y.o.   MRN: [id number]  Extremity Weakness  Pertinent negatives include no fever or numbness.   [name] is a 77 year old woman with PMH of ESRD on HD on T/Th/Sat, diastolic CHF, A.fib on anticoagulation, and Vitamin B12, who presents for evaluation of bilateral leg weakness. She states that she has had bilateral leg weakness for months now. She hd this problem years ago that improved with Procrit injections and she requests this treatment today.  Her vision has been poor for at least two years now which she attributes to a "stroke" due to low blood pressure during HD and has not been waling much outside due to worry that she may trip and fall. She has been followed by ophthalmology and has a follow up appointment next month.  She denies falls, confusion, changes in her speech, syncope, vertigo, or hemiparesis, or bowel or bladder dysfunction. He back pain is improving and she has not needed to take Norco everyday.  Her husband, who was present during this visit is concerned that she is deconditioned due to her sedentary lifestyle.    Review of Systems  Constitutional: Negative for fever, chills, diaphoresis, activity change, appetite change and fatigue.  Respiratory: Negative for cough and shortness of breath.   Cardiovascular: Positive for palpitations. Negative for chest pain and leg swelling.  Genitourinary: Negative for dysuria.  Musculoskeletal: Positive for back pain and extremity weakness. Negative for gait problem.  Skin: Negative for color change, pallor and rash.  Neurological: Positive for weakness. Negative for dizziness, syncope, speech difficulty, light-headedness, numbness and headaches.  Psychiatric/Behavioral: Negative for behavioral problems, confusion and agitation.       Objective:   Physical Exam  Nursing note and vitals reviewed. Constitutional: She is oriented to person, place, and  time. She appears well-developed and well-nourished. No distress.  HENT:  Decreased discerning of image sharpness but pt reports intact visual fields.   Eyes: Conjunctivae are normal. No scleral icterus.  Cardiovascular: Normal rate and regular rhythm.   Pulmonary/Chest: Effort normal and breath sounds normal. No respiratory distress. She has no wheezes. She has no rales.  Abdominal: Soft.  Musculoskeletal: She exhibits no edema and no tenderness.  Biceps, triceps, and grip strength 5/5 bilaterally.  Hip flexion, extension, adduction, abduction, 5/5 bilaterally.  Lower leg extension/flexion, dorsiflexion and plantar flexion 4/5 bilaterally.     Neurological: She is alert and oriented to person, place, and time. No cranial nerve deficit. She exhibits normal muscle tone. Coordination normal.  Sensation to light tough intact bilaterally.  Normal gait.    Skin: Skin is warm and dry. She is not diaphoretic.  Psychiatric: She has a normal mood and affect. Her behavior is normal.          Assessment & Plan:

## 2012-12-03 NOTE — Assessment & Plan Note (Signed)
BP Readings from Last 3 Encounters:  12/02/12 200/66  11/28/12 186/78  06/10/12 182/64    Lab Results  Component Value Date   NA 143 04/21/2011   K 4.2 04/21/2011   CREATININE 4.49* 04/21/2011    Assessment: Blood pressure control:  Progress toward BP goal:  at goal Comments: She is on amiodarone 200mg  BID. Her BP is labile with low BP during HD and reaction to multiple antihypertensive medications. She is followed closely by her Cardiologist with recent visit on 11/28/12. She does not want to start a new BP medication at this time due to concerns of low BP during HD.  Plan: Medications:  continue current medications Educational resources provided:   Self management tools provided:   Other plans: Continue current medications, pt to continue following up with Cardiology and Nephrology for her labile BP.

## 2012-12-03 NOTE — Assessment & Plan Note (Addendum)
Will check TSH with complaint of bl LE weakness.

## 2012-12-03 NOTE — Assessment & Plan Note (Signed)
Pt's husband states that he thought her treatment was finished.  Will check a B12 level.  Addendum: Called pt, talked to her husband, he agrees to bring her this week for B12 level check.

## 2012-12-05 NOTE — Progress Notes (Signed)
Case discussed with Dr. Kennerly at the time of the visit.  We reviewed the resident's history and exam and pertinent patient test results.  I agree with the assessment, diagnosis, and plan of care documented in the resident's note. 

## 2013-01-09 ENCOUNTER — Other Ambulatory Visit (INDEPENDENT_AMBULATORY_CARE_PROVIDER_SITE_OTHER): Payer: Medicaid Other

## 2013-01-09 ENCOUNTER — Ambulatory Visit (INDEPENDENT_AMBULATORY_CARE_PROVIDER_SITE_OTHER): Payer: Medicaid Other | Admitting: *Deleted

## 2013-01-09 ENCOUNTER — Other Ambulatory Visit: Payer: Self-pay

## 2013-01-09 DIAGNOSIS — I4891 Unspecified atrial fibrillation: Secondary | ICD-10-CM

## 2013-01-10 LAB — HEPATIC FUNCTION PANEL
ALT: 12 U/L (ref 0–35)
Bilirubin, Direct: 0.1 mg/dL (ref 0.0–0.3)
Total Bilirubin: 0.7 mg/dL (ref 0.3–1.2)
Total Protein: 8 g/dL (ref 6.0–8.3)

## 2013-01-23 ENCOUNTER — Telehealth: Payer: Self-pay | Admitting: Cardiovascular Disease

## 2013-01-23 ENCOUNTER — Telehealth: Payer: Self-pay

## 2013-01-23 NOTE — Telephone Encounter (Signed)
New problem    Prior authorization  Of medication.

## 2013-01-23 NOTE — Telephone Encounter (Signed)
Ordered by urologist, must get PA from MD that signed prescription.

## 2013-01-24 ENCOUNTER — Other Ambulatory Visit: Payer: Self-pay | Admitting: Cardiovascular Disease

## 2013-02-04 ENCOUNTER — Telehealth: Payer: Self-pay | Admitting: Cardiovascular Disease

## 2013-02-04 NOTE — Telephone Encounter (Signed)
I spoke with the pt's husband and he states that the dialysis center MD (Dr Arrie Aran) called and spoke with Dr Excell Seltzer about the pt's BP and pulse.  The dialysis physician told them that our office would contact the pt for an appointment.  I scheduled the pt to see Dr Excell Seltzer on 02/05/13.

## 2013-02-04 NOTE — Telephone Encounter (Signed)
New message     C/o heart beating fast and bp is high.  Talk to the nurse about maybe adjusting her meds.

## 2013-02-05 ENCOUNTER — Encounter: Payer: Self-pay | Admitting: Cardiovascular Disease

## 2013-02-05 ENCOUNTER — Ambulatory Visit (INDEPENDENT_AMBULATORY_CARE_PROVIDER_SITE_OTHER): Payer: Medicaid Other | Admitting: Cardiovascular Disease

## 2013-02-05 VITALS — BP 168/64 | HR 53 | Ht 63.0 in | Wt 126.0 lb

## 2013-02-05 DIAGNOSIS — I4891 Unspecified atrial fibrillation: Secondary | ICD-10-CM

## 2013-02-05 MED ORDER — HYDRALAZINE HCL 25 MG PO TABS: 25.0000 mg | ORAL_TABLET | Freq: Two times a day (BID) | ORAL | Status: DC

## 2013-02-05 NOTE — Progress Notes (Signed)
HPI:  77 year old woman presenting for followup evaluation. She has end-stage renal disease. Her cardiac issues include malignant hypertension and paroxysmal atrial fibrillation. She's been intolerant to many blood pressure medications. She complains of low blood pressure during and immediately after dialysis. I spoke with Dr Arrie Aran recently and we started her on low-dose cardizem CD because of markedly increased BP. The patient took Cardizem for 2 or 3 days and then stopped because of abdominal cramping and back pain. She continues on felodipine 2.5 mg twice daily but states that this is not controlling her blood pressure. She asks me to start her on a medication that will "agree with her."  She denies chest pain or pressure. She's had no palpitations or edema.   Outpatient Encounter Prescriptions as of 02/05/2013  Medication Sig  . amiodarone (PACERONE) 100 MG tablet Take 100 mg by mouth 2 (two) times daily.  . felodipine (PLENDIL) 5 MG 24 hr tablet Take 5 mg by mouth 2 (two) times daily.  Marland Kitchen latanoprost (XALATAN) 0.005 % ophthalmic solution Place 1 drop into both eyes at bedtime.  Marland Kitchen levothyroxine (SYNTHROID, LEVOTHROID) 137 MCG tablet Take 137 mcg by mouth daily before breakfast.  . ranitidine (ZANTAC) 150 MG tablet Take 150 mg by mouth at bedtime.  Marland Kitchen warfarin (COUMADIN) 5 MG tablet TAKE BY MOUTH AS DIRECTED BY COUMADIN CLINIC  . [DISCONTINUED] amiodarone (PACERONE) 200 MG tablet Take 0.5 tablets (100 mg total) by mouth 2 (two) times daily.  . [DISCONTINUED] cyanocobalamin (,VITAMIN B-12,) 1000 MCG/ML injection Inject 1 mL (1,000 mcg total) into the muscle once.  . [DISCONTINUED] HYDROcodone-acetaminophen (NORCO/VICODIN) 5-325 MG per tablet Take 1-2 tablets by mouth every 6 (six) hours as needed for pain.  . [DISCONTINUED] iron polysaccharides (NIFEREX) 150 MG capsule Take 150 mg by mouth 2 (two) times daily.  . [DISCONTINUED] levothyroxine (SYNTHROID, LEVOTHROID) 112 MCG tablet Take 112  mcg by mouth every morning. Take on an empty stomach  . [DISCONTINUED] PACERONE 100 MG tablet TAKE 1 TABLET BY MOUTH TWICE DAILY    Allergies  Allergen Reactions  . Cardizem [Diltiazem Hcl] Hives  . Codeine Other (See Comments)    dizziness  . Iron     ?? Almost died.  Dennie Maizes [Amiodarone] Other (See Comments)    Visual changes with generic Amiodarone, the pt can only take brand name Pacerone  . Rena-Vite [B-Plex] Other (See Comments)    Unknown reaction  . Vancomycin Other (See Comments)    dizziness  . Venofer [Iron Sucrose (Iron Oxide Saccharated)] Itching  . Latex Itching and Rash    Past Medical History  Diagnosis Date  . ESRD (end stage renal disease)     HD for 1 year, HD on Tues, Thurs, Sat // Likely secondary to nephrosclerosis from HTN  . HTN (hypertension)   . HLD (hyperlipidemia)   . Cholelithiases     S/P biliary colic cholecystostomy, but not cholecystectomy secondary to omental adhesions  . Anxiety   . Anemia     BL Hgb 8-9. Likely AOCD in setting of ESRD with iron 48, ferritin  746 (03/2010)  . PAF (paroxysmal atrial fibrillation)     On chronic coumadin therapy // Followed by Dr. Excell Seltzer (cardiology)  . Non-ST elevation MI (NSTEMI)     Noninvasive evaluation with Myoview negative for ischemia.  // 2-D echo (05/2010) LVEF 65-70%. Grade 1 diastolic dysfunction.  Peak PA pressure 52.  Marland Kitchen Hyperthyroidism DX: 07/2010    Likely Grave's Disease. // On diagnosis, TSH <  0.08, free T4 1.94, free T3 5.3 . //  Thyroid US (07/17/2010) - nonspecific diffuse heterogenous thyroid gland. //  Thyrotropin receptor antibody neg (07/17/2010)  . Atrial flutter   . Anticoagulant long-term use    BP 168/64  Pulse 53  Ht 5\' 3"  (1.6 m)  Wt 126 lb (57.153 kg)  BMI 22.33 kg/m2  PHYSICAL EXAM: Pt is alert and oriented, pleasant elderly woman in NAD HEENT: normal Neck: JVP - normal Lungs: CTA bilaterally CV: RRR without murmur or gallop Abd: soft, NT, Positive BS, no  hepatomegaly Ext: Trace edema bilaterally, distal pulses intact and equal Skin: warm/dry no rash  EKG:  Sinus bradycardia 53 beats per minute, prolonged QT with QTC 504 ms.  ASSESSMENT AND PLAN: 1. Paroxysmal atrial fibrillation. She is maintaining sinus rhythm on amiodarone. Will continue the same. She is tolerating warfarin for anticoagulation.  2. Malignant hypertension. I explained to the patient that there would earlier no new medications for her to try. She has been tried on dozens of antihypertensives in every class and is intolerant to everything. I think our only option is to recycle through a previously attempted medications. I asked her to try hydralazine 25 mg twice daily in addition to felodipine at the current dose. She will not take either of these medications on the mornings of dialysis. I will see her back in 6 months for followup.  Tonny Bollman 02/05/2013 5:04 PM

## 2013-02-05 NOTE — Patient Instructions (Addendum)
Your physician has recommended you make the following change in your medication: START Hydralazine 25mg  take one by mouth twice a day  Please do not take any BP medications on the mornings of dialysis.   Your physician recommends that you schedule a follow-up appointment in: MARCH 2015 with Dr Excell Seltzer

## 2013-02-20 ENCOUNTER — Ambulatory Visit (INDEPENDENT_AMBULATORY_CARE_PROVIDER_SITE_OTHER): Payer: Medicaid Other | Admitting: *Deleted

## 2013-02-20 DIAGNOSIS — I4891 Unspecified atrial fibrillation: Secondary | ICD-10-CM

## 2013-02-20 LAB — POCT INR: INR: 2.2

## 2013-02-24 ENCOUNTER — Other Ambulatory Visit: Payer: Self-pay

## 2013-02-24 MED ORDER — AMIODARONE HCL 100 MG PO TABS: 100.0000 mg | ORAL_TABLET | Freq: Two times a day (BID) | ORAL | Status: DC

## 2013-03-19 ENCOUNTER — Other Ambulatory Visit: Payer: Self-pay | Admitting: Cardiovascular Disease

## 2013-04-03 ENCOUNTER — Ambulatory Visit (INDEPENDENT_AMBULATORY_CARE_PROVIDER_SITE_OTHER): Payer: Medicaid Other | Admitting: *Deleted

## 2013-04-03 DIAGNOSIS — I4891 Unspecified atrial fibrillation: Secondary | ICD-10-CM

## 2013-04-03 LAB — POCT INR: INR: 2.2

## 2013-04-07 ENCOUNTER — Other Ambulatory Visit: Payer: Self-pay | Admitting: *Deleted

## 2013-04-07 ENCOUNTER — Other Ambulatory Visit (INDEPENDENT_AMBULATORY_CARE_PROVIDER_SITE_OTHER): Payer: Medicaid Other

## 2013-04-07 DIAGNOSIS — E0590 Thyrotoxicosis, unspecified without thyrotoxic crisis or storm: Secondary | ICD-10-CM

## 2013-04-07 LAB — T4, FREE: FREE T4: 1.57 ng/dL (ref 0.60–1.60)

## 2013-04-07 LAB — TSH: TSH: 0.76 u[IU]/mL (ref 0.35–5.50)

## 2013-04-07 LAB — T3, FREE: T3, Free: 1.8 pg/mL — ABNORMAL LOW (ref 2.3–4.2)

## 2013-04-10 ENCOUNTER — Ambulatory Visit (INDEPENDENT_AMBULATORY_CARE_PROVIDER_SITE_OTHER): Payer: Medicaid Other | Admitting: Endocrinology

## 2013-04-10 ENCOUNTER — Encounter: Payer: Self-pay | Admitting: Endocrinology

## 2013-04-10 VITALS — BP 138/64 | HR 62 | Temp 98.3°F | Resp 14 | Ht 61.25 in | Wt 129.3 lb

## 2013-04-10 DIAGNOSIS — E890 Postprocedural hypothyroidism: Secondary | ICD-10-CM

## 2013-04-10 MED ORDER — LEVOTHYROXINE SODIUM 137 MCG PO TABS: 137.0000 ug | ORAL_TABLET | Freq: Every day | ORAL | Status: DC

## 2013-04-10 NOTE — Progress Notes (Signed)
Patient ID: [name], female   DOB: 1936/03/11, 78 y.o.   MRN: [id number]  Reason for Appointment:  Hypothyroidism, followup visit    History of Present Illness:   The hypothyroidism was first diagnosed after her radioactive iodine treatment for her Luiz Blare' disease  in 08/2010   The patient has been treated with various doses of levothyroxine. Her dosage requirement has been quite variable until recently The last visit was in 6/14   Patient has no complaints of unusual fatigue, cold intolerance, dry skin. Her weight does fluctuate and has gone up since her last visit She tends to get cold in her legs only. Also has had hair loss for quite some time which has not improved.           The patient is taking the thyroid supplement very regularlybefore lunch. She finds this more convenient because of going for dialysis in the mornings on some days Not taking any calcium or iron supplements with the thyroid supplement.    On the last visit the dose was unchanged when her TSH was 2.7  Appointment on 04/07/2013  Component Date Value Range Status  . T3, Free 04/07/2013 1.8* 2.3 - 4.2 pg/mL Final  . TSH 04/07/2013 0.76  0.35 - 5.50 uIU/mL Final  . Free T4 04/07/2013 1.57  0.60 - 1.60 ng/dL Final      Medication List       This list is accurate as of: 04/10/13  3:59 PM.  Always use your most recent med list.               amiodarone 100 MG tablet  Commonly known as:  PACERONE  Take 1 tablet (100 mg total) by mouth 2 (two) times daily.     felodipine 2.5 MG 24 hr tablet  Commonly known as:  PLENDIL  Take 2.5 mg by mouth 2 (two) times daily.     hydrALAZINE 25 MG tablet  Commonly known as:  APRESOLINE  Take 1 tablet (25 mg total) by mouth 2 (two) times daily.     latanoprost 0.005 % ophthalmic solution  Commonly known as:  XALATAN  Place 1 drop into both eyes at bedtime.     levothyroxine 137 MCG tablet  Commonly known as:  SYNTHROID, LEVOTHROID  Take 137 mcg by mouth daily  before breakfast.     ranitidine 150 MG tablet  Commonly known as:  ZANTAC  Take 150 mg by mouth at bedtime.     warfarin 5 MG tablet  Commonly known as:  COUMADIN  TAKE BY MOUTH AS DIRECTED BY COUMADIN CLINIC        Allergies:  Allergies  Allergen Reactions  . Cardizem [Diltiazem Hcl] Hives  . Codeine Other (See Comments)    dizziness  . Iron     ?? Almost died.  Dennie Maizes [Amiodarone] Other (See Comments)    Visual changes with generic Amiodarone, the pt can only take brand name Pacerone  . Rena-Vite [B-Plex] Other (See Comments)    Unknown reaction  . Vancomycin Other (See Comments)    dizziness  . Venofer [Iron Sucrose (Iron Oxide Saccharated)] Itching  . Latex Itching and Rash    Past Medical History  Diagnosis Date  . ESRD (end stage renal disease)     HD for 1 year, HD on Tues, Thurs, Sat // Likely secondary to nephrosclerosis from HTN  . HTN (hypertension)   . HLD (hyperlipidemia)   . Cholelithiases     S/P biliary colic  cholecystostomy, but not cholecystectomy secondary to omental adhesions  . Anxiety   . Anemia     BL Hgb 8-9. Likely AOCD in setting of ESRD with iron 48, ferritin  746 (03/2010)  . PAF (paroxysmal atrial fibrillation)     On chronic coumadin therapy // Followed by Dr. Excell Seltzerooper (cardiology)  . Non-ST elevation MI (NSTEMI)     Noninvasive evaluation with Myoview negative for ischemia.  // 2-D echo (05/2010) LVEF 65-70%. Grade 1 diastolic dysfunction.  Peak PA pressure 52.  Marland Kitchen. Hyperthyroidism DX: 07/2010    Likely Grave's Disease. // On diagnosis, TSH < 0.08, free T4 1.94, free T3 5.3 . //  Thyroid US (07/17/2010) - nonspecific diffuse heterogenous thyroid gland. //  Thyrotropin receptor antibody neg (07/17/2010)  . Atrial flutter   . Anticoagulant long-term use     Past Surgical History  Procedure Laterality Date  . Other surgical history  02/2009    Cholecystostomy secondary to biliary colic - NO cholecystectomy secondary to omental  adhesions - by Dr. Bertram SavinAmber Allen  . Gallbladder surgery    . Breast lumpectomy      right  . Temple artery biopsy  12/14/10    Family History  Problem Relation Age of Onset  . Stroke    . Heart attack      Social History:  reports that she has never smoked. She does not have any smokeless tobacco history on file. She reports that she does not drink alcohol or use illicit drugs.  REVIEW Of SYSTEMS:  She has end-stage kidney disease and is on dialysis History of atrial fibrillation followed by cardiologist   Examination:   BP 138/64  Pulse 62  Temp(Src) 98.3 F (36.8 C)  Resp 14  Ht 5' 1.25" (1.556 m)  Wt 129 lb 4.8 oz (58.65 kg)  BMI 24.22 kg/m2  SpO2 93%  GENERAL APPEARANCE: Alert, no puffiness of the face or eyes She has mild patchy hair loss  NECK: Thyroid is not palpable           NEUROLOGIC EXAM:  biceps reflexes show normal relaxation Skin: Appears normal    Assessment   Hypothyroidism, post ablative with currently stable TSH level She is clinically doing well except for her chronic problems with alopecia which may be unrelated Although she is taking her thyroid supplement before lunch instead of in the morning this appears to be consistently absorbed and she can continue.    Treatment:   Continue same dosage and followup in 6 months  Rhyder Bratz 04/10/2013, 3:59 PM

## 2013-04-11 ENCOUNTER — Telehealth: Payer: Self-pay | Admitting: Endocrinology

## 2013-04-11 ENCOUNTER — Other Ambulatory Visit: Payer: Self-pay | Admitting: *Deleted

## 2013-04-11 MED ORDER — LEVOTHYROXINE SODIUM 137 MCG PO TABS: 137.0000 ug | ORAL_TABLET | Freq: Every day | ORAL | Status: DC

## 2013-04-11 NOTE — Telephone Encounter (Signed)
Please send thyroid prescription

## 2013-04-11 NOTE — Telephone Encounter (Signed)
Caller: Geneive/Patient; PCP: Other; CB#: [phone]; Call regarding Prescripton not at pharmacy; [facility].  [facility] [address] # [phone], Spoke with Angelique Blonderenise.  Patient is insisting that RX be written for brand name, he will not accept the generic.  [facility] has to have new rx sent for "brand only" as the patient in on Medicaid. Went back to patient on hold and advised that [facility] has the medication that he needs, however, it is manufactured by a different company than his previous rx, however, this medication is the same.  He needs to pickup the medication that was prescribed to him and filled by [facility].  He verbalized understanding.   [facility] also states that [facility] for Plendil 2.5mg .  They have faxed this over and just want to make sure the request was received.  Called back to patient and advised this prior Berkley Harveyauth may take a while to receive.  He should f/u with [facility] by phone in next week. He is very concerned because the paitent has no medication at this time (Felodipine)  advised him that Dr. Lucianne MussKumar will call him with a new rx if he wants her to take a replacement until this medication can be authorized.  Called to the office to make sure that Dr. Lucianne MussKumar was in the office today and [facility].  Will send a priority message for this to be handled today.

## 2013-04-11 NOTE — Telephone Encounter (Signed)
rx sent

## 2013-04-14 ENCOUNTER — Other Ambulatory Visit: Payer: Self-pay

## 2013-04-14 ENCOUNTER — Telehealth: Payer: Self-pay | Admitting: *Deleted

## 2013-04-14 MED ORDER — FELODIPINE ER 2.5 MG PO TB24: 2.5000 mg | ORAL_TABLET | Freq: Two times a day (BID) | ORAL | Status: DC

## 2013-04-14 NOTE — Telephone Encounter (Signed)
Called and spoke with Mr Tommy RainwaterKhanlarian. He cannot pick up the medication until there is an approval by MD to [place] [facility].  [facility]oke with [facility] who verifies this.  Spoke to Sprint Nextel CorporationKim in refills who has taken care of this today.

## 2013-04-14 NOTE — Telephone Encounter (Signed)
Was called by front desk and told that Mr. [name] is there asking why a medication cannot be refilled.  Asked them to have patient take a seat and I would be out to speak with them when I finished working with another patient.  When I went to waiting area, was told by check in that he left.  And left the medication information and a mobile phone # for me to call him.  Discussed with Rose in refills, who refilled his [facility]ip[facility] 2.5mg  [facility] [address]  Called pt to communicate this.  No voice mail set up on mobile #.  No answer at home #.  Did not leave message there.  Will attempt again to reach them.

## 2013-05-05 ENCOUNTER — Other Ambulatory Visit: Payer: Self-pay | Admitting: *Deleted

## 2013-05-05 MED ORDER — SYNTHROID 137 MCG PO TABS: 137.0000 ug | ORAL_TABLET | Freq: Every day | ORAL | Status: DC

## 2013-05-11 ENCOUNTER — Emergency Department (HOSPITAL_COMMUNITY): Payer: Medicaid Other

## 2013-05-11 ENCOUNTER — Emergency Department (HOSPITAL_COMMUNITY)
Admission: EM | Admit: 2013-05-11 | Discharge: 2013-05-11 | Disposition: A | Discharge status: Home / Self Care | Payer: Medicaid Other | Attending: Emergency Medicine | Admitting: Emergency Medicine

## 2013-05-11 ENCOUNTER — Encounter (HOSPITAL_COMMUNITY): Payer: Self-pay | Admitting: Emergency Medicine

## 2013-05-11 DIAGNOSIS — E0590 Thyrotoxicosis, unspecified without thyrotoxic crisis or storm: Secondary | ICD-10-CM | POA: Insufficient documentation

## 2013-05-11 DIAGNOSIS — I4891 Unspecified atrial fibrillation: Secondary | ICD-10-CM | POA: Insufficient documentation

## 2013-05-11 DIAGNOSIS — Z91040 Latex allergy status: Secondary | ICD-10-CM | POA: Insufficient documentation

## 2013-05-11 DIAGNOSIS — I120 Hypertensive chronic kidney disease with stage 5 chronic kidney disease or end stage renal disease: Secondary | ICD-10-CM | POA: Insufficient documentation

## 2013-05-11 DIAGNOSIS — Z862 Personal history of diseases of the blood and blood-forming organs and certain disorders involving the immune mechanism: Secondary | ICD-10-CM | POA: Insufficient documentation

## 2013-05-11 DIAGNOSIS — Z7901 Long term (current) use of anticoagulants: Secondary | ICD-10-CM | POA: Insufficient documentation

## 2013-05-11 DIAGNOSIS — Z992 Dependence on renal dialysis: Secondary | ICD-10-CM | POA: Insufficient documentation

## 2013-05-11 DIAGNOSIS — Y929 Unspecified place or not applicable: Secondary | ICD-10-CM | POA: Insufficient documentation

## 2013-05-11 DIAGNOSIS — Z8659 Personal history of other mental and behavioral disorders: Secondary | ICD-10-CM | POA: Insufficient documentation

## 2013-05-11 DIAGNOSIS — Z79899 Other long term (current) drug therapy: Secondary | ICD-10-CM | POA: Insufficient documentation

## 2013-05-11 DIAGNOSIS — N186 End stage renal disease: Secondary | ICD-10-CM | POA: Insufficient documentation

## 2013-05-11 DIAGNOSIS — IMO0002 Reserved for concepts with insufficient information to code with codable children: Secondary | ICD-10-CM | POA: Insufficient documentation

## 2013-05-11 DIAGNOSIS — Z8639 Personal history of other endocrine, nutritional and metabolic disease: Secondary | ICD-10-CM | POA: Insufficient documentation

## 2013-05-11 DIAGNOSIS — S8010XA Contusion of unspecified lower leg, initial encounter: Secondary | ICD-10-CM

## 2013-05-11 DIAGNOSIS — Y9301 Activity, walking, marching and hiking: Secondary | ICD-10-CM | POA: Insufficient documentation

## 2013-05-11 DIAGNOSIS — I252 Old myocardial infarction: Secondary | ICD-10-CM | POA: Insufficient documentation

## 2013-05-11 DIAGNOSIS — Z8719 Personal history of other diseases of the digestive system: Secondary | ICD-10-CM | POA: Insufficient documentation

## 2013-05-11 NOTE — ED Provider Notes (Signed)
CSN: [id number]     Arrival date & time 05/11/13  1632 History   First MD Initiated Contact with Patient 05/11/13 1734     Chief Complaint  Patient presents with  . Leg Injury     (Consider location/radiation/quality/duration/timing/severity/associated sxs/prior Treatment) HPI   Patient to the ER for evaluation of leg injury. She was walking in the dark 4 days ago when she accidentally ran into a piece of exercise equipment hitting her leg. She immediately felt pain. Did not fall over, did not hit head or loose consciousness. Since then she has had significant swelling and pain to this area. Her husband encouraged her to go after dialysis a few days ago but she did not want to. The pain has gotten better she says and the swelling has gone down significantly. The nurse reports she came because it is worse, pt denies this. She says that the symptoms are all improving but since it is not BETTER she came to get evaluated. Patient walked into ER and walking around room when I entered. Denies foot pain, swelling, loss of sensation of new wound to distal portions of the injury.    Past Medical History  Diagnosis Date  . ESRD (end stage renal disease)     HD for 1 year, HD on Tues, Thurs, Sat // Likely secondary to nephrosclerosis from HTN  . HTN (hypertension)   . HLD (hyperlipidemia)   . Cholelithiases     S/P biliary colic cholecystostomy, but not cholecystectomy secondary to omental adhesions  . Anxiety   . Anemia     BL Hgb 8-9. Likely AOCD in setting of ESRD with iron 48, ferritin  746 (03/2010)  . PAF (paroxysmal atrial fibrillation)     On chronic coumadin therapy // Followed by Dr. Excell Seltzer (cardiology)  . Non-ST elevation MI (NSTEMI)     Noninvasive evaluation with Myoview negative for ischemia.  // 2-D echo (05/2010) LVEF 65-70%. Grade 1 diastolic dysfunction.  Peak PA pressure 52.  Marland Kitchen Hyperthyroidism DX: 07/2010    Likely Grave's Disease. // On diagnosis, TSH < 0.08, free T4 1.94,  free T3 5.3 . //  Thyroid US (07/17/2010) - nonspecific diffuse heterogenous thyroid gland. //  Thyrotropin receptor antibody neg (07/17/2010)  . Atrial flutter   . Anticoagulant long-term use    Past Surgical History  Procedure Laterality Date  . Other surgical history  02/2009    Cholecystostomy secondary to biliary colic - NO cholecystectomy secondary to omental adhesions - by Dr. Bertram Savin  . Gallbladder surgery    . Breast lumpectomy      right  . Temple artery biopsy  12/14/10   Family History  Problem Relation Age of Onset  . Stroke    . Heart attack     History  Substance Use Topics  . Smoking status: Never Smoker   . Smokeless tobacco: Not on file  . Alcohol Use: No   OB History   Grav Para Term Preterm Abortions TAB SAB Ect Mult Living                 Review of Systems  The patient denies anorexia, fever, weight loss,, vision loss, decreased hearing, hoarseness, chest pain, syncope, dyspnea on exertion, peripheral edema, balance deficits, hemoptysis, abdominal pain, melena, hematochezia, severe indigestion/heartburn, hematuria, incontinence, genital sores, muscle weakness, suspicious skin lesions, transient blindness, difficulty walking, depression, unusual weight change, abnormal bleeding, enlarged lymph nodes, angioedema, and breast masses.   Allergies  Cardizem; Codeine; Iron; Pacerone;  Rena-vite; Vancomycin; Venofer; and Latex  Home Medications   Current Outpatient Rx  Name  Route  Sig  Dispense  Refill  . amiodarone (PACERONE) 100 MG tablet   Oral   Take 1 tablet (100 mg total) by mouth 2 (two) times daily.   60 tablet   3   . felodipine (PLENDIL) 2.5 MG 24 hr tablet   Oral   Take 1 tablet (2.5 mg total) by mouth 2 (two) times daily.   60 tablet   6   . hydrALAZINE (APRESOLINE) 25 MG tablet   Oral   Take 1 tablet (25 mg total) by mouth 2 (two) times daily.   60 tablet   6   . HYDROcodone-acetaminophen (NORCO/VICODIN) 5-325 MG per tablet    Oral   Take 1 tablet by mouth every 6 (six) hours as needed for moderate pain.         Marland Kitchen latanoprost (XALATAN) 0.005 % ophthalmic solution   Both Eyes   Place 1 drop into both eyes at bedtime.         . ranitidine (ZANTAC) 150 MG tablet   Oral   Take 150 mg by mouth at bedtime.         Marland Kitchen SYNTHROID 137 MCG tablet   Oral   Take 1 tablet (137 mcg total) by mouth daily before breakfast.   30 tablet   5     Dispense as written.   . warfarin (COUMADIN) 5 MG tablet   Oral   Take 2.5-5 mg by mouth daily. Tuesday Thursday Sunday patient takes 5 mg all the other day are 2 .5 mg ( 1/2 tab of 5 mg)          BP 167/56  Pulse 55  Temp(Src) 98.2 F (36.8 C) (Oral)  Resp 16  Ht 5\' 3"  (1.6 m)  Wt 133 lb 1.6 oz (60.374 kg)  BMI 23.58 kg/m2  SpO2 100% Physical Exam  Nursing note and vitals reviewed. Constitutional: She appears well-developed and well-nourished. No distress.  HENT:  Head: Normocephalic and atraumatic.  Eyes: Pupils are equal, round, and reactive to light.  Neck: Normal range of motion. Neck supple.  Cardiovascular: Normal rate and regular rhythm.   Pulmonary/Chest: Effort normal.  Abdominal: Soft.  Musculoskeletal:       Left lower leg: She exhibits tenderness, bony tenderness and swelling. She exhibits no deformity and no laceration.  Large hematoma to posterior tib/fib on left side, significant swelling.  Pedal pulse is strong CR < 2 seconds to toes. Sensation is physiologic. No tenderness to palpation of posterior calf or areas distal/proximal to injury.  Neurological: She is alert.  Skin: Skin is warm and dry.    ED Course  Procedures (including critical care time) Labs Review Labs Reviewed - No data to display Imaging Review Dg Tibia/fibula Left  05/11/2013   CLINICAL DATA:  Fall  EXAM: LEFT TIBIA AND FIBULA - 2 VIEW  COMPARISON:  None.  FINDINGS: Three views of left tibia-fibula submitted. No acute fracture or subluxation. Diffuse mild soft  tissue swelling.  IMPRESSION: Negative.  Diffuse mild soft tissue swelling.   Electronically Signed   By: Natasha Mead M.D.   On: 05/11/2013 18:25     EKG Interpretation None      MDM   Final diagnoses:  Hematoma of leg    Patients xray is negative for fracture or boney abnormality, it shows diffuse soft tissue swelling consistent with hematoma found on physical exam.  Dr.  Effie ShyWentz saw patient as well and gave patient education and how to care for injury, pt declines pain medication, reports that she has some at home.  78 y.o.[name]'s evaluation in the Emergency Department is complete. It has been determined that no acute conditions requiring further emergency intervention are present at this time. The patient/guardian have been advised of the diagnosis and plan. We have discussed signs and symptoms that warrant return to the ED, such as changes or worsening in symptoms.  Vital signs are stable at discharge. Filed Vitals:   05/11/13 1800  BP: 167/56  Pulse: 55  Temp:   Resp:     Patient/guardian has voiced understanding and agreed to follow-up with the PCP or specialist.     Dorthula Matasiffany G Arlicia Paquette, PA-C 05/11/13 1851  Flint MelterElliott L Wentz, MD 03/30/14 1004

## 2013-05-11 NOTE — ED Notes (Signed)
Pt got out of bed Wednesday night to use the bathroom and it was dark and she fell over her exercise machine injuring her LLE. She has swelling and brusing to L anterior calf. She states that she did not want to seek treatment but the pain is getting worse. Cms intact.

## 2013-05-11 NOTE — ED Notes (Signed)
Patient transported to X-ray 

## 2013-05-11 NOTE — Discharge Instructions (Signed)
Contusion A contusion is a deep bruise. Contusions are the result of an injury that caused bleeding under the skin. The contusion may turn blue, purple, or yellow. Minor injuries will give you a painless contusion, but more severe contusions may stay painful and swollen for a few weeks.  CAUSES  A contusion is usually caused by a blow, trauma, or direct force to an area of the body. SYMPTOMS   Swelling and redness of the injured area.  Bruising of the injured area.  Tenderness and soreness of the injured area.  Pain. DIAGNOSIS  The diagnosis can be made by taking a history and physical exam. An X-ray, CT scan, or MRI may be needed to determine if there were any associated injuries, such as fractures. TREATMENT  Specific treatment will depend on what area of the body was injured. In general, the best treatment for a contusion is resting, icing, elevating, and applying cold compresses to the injured area. Over-the-counter medicines may also be recommended for pain control. Ask your caregiver what the best treatment is for your contusion. HOME CARE INSTRUCTIONS   Put ice on the injured area.  Put ice in a plastic bag.  Place a towel between your skin and the bag.  Leave the ice on for 15-20 minutes, 03-04 times a day.  Only take over-the-counter or prescription medicines for pain, discomfort, or fever as directed by your caregiver. Your caregiver may recommend avoiding anti-inflammatory medicines (aspirin, ibuprofen, and naproxen) for 48 hours because these medicines may increase bruising.  Rest the injured area.  If possible, elevate the injured area to reduce swelling. SEEK IMMEDIATE MEDICAL CARE IF:   You have increased bruising or swelling.  You have pain that is getting worse.  Your swelling or pain is not relieved with medicines. MAKE SURE YOU:   Understand these instructions.  Will watch your condition.  Will get help right away if you are not doing well or get  worse. Document Released: 12/07/2004 Document Revised: 05/22/2011 Document Reviewed: 01/02/2011 [employer] Patient Information 2014 [employer]. Hematoma A hematoma is a collection of blood under the skin, in an organ, in a body space, in a joint space, or in other tissue. The blood can clot to form a lump that you can see and feel. The lump is often firm and may sometimes become sore and tender. Most hematomas get better in a few days to weeks. However, some hematomas may be serious and require medical care. Hematomas can range in size from very small to very large. CAUSES  A hematoma can be caused by a blunt or penetrating injury. It can also be caused by spontaneous leakage from a blood vessel under the skin. Spontaneous leakage from a blood vessel is more likely to occur in older people, especially those taking blood thinners. Sometimes, a hematoma can develop after certain medical procedures. SIGNS AND SYMPTOMS   A firm lump on the body.  Possible pain and tenderness in the area.  Bruising.Blue, dark blue, purple-red, or yellowish skin may appear at the site of the hematoma if the hematoma is close to the surface of the skin. For hematomas in deeper tissues or body spaces, the signs and symptoms may be subtle. For example, an intra-abdominal hematoma may cause abdominal pain, weakness, fainting, and shortness of breath. An intracranial hematoma may cause a headache or symptoms such as weakness, trouble speaking, or a change in consciousness. DIAGNOSIS  A hematoma can usually be diagnosed based on your medical history and a   physical exam. Imaging tests may be needed if your health care provider suspects a hematoma in deeper tissues or body spaces, such as the abdomen, head, or chest. These tests may include ultrasonography or a CT scan.  TREATMENT  Hematomas usually go away on their own over time. Rarely does the blood need to be drained out of the body. Large hematomas or those that may  affect vital organs will sometimes need surgical drainage or monitoring. HOME CARE INSTRUCTIONS   Apply ice to the injured area:   Put ice in a plastic bag.   Place a towel between your skin and the bag.   Leave the ice on for 20 minutes, 2 3 times a day for the first 1 to 2 days.   After the first 2 days, switch to using warm compresses on the hematoma.   Elevate the injured area to help decrease pain and swelling. Wrapping the area with an elastic bandage may also be helpful. Compression helps to reduce swelling and promotes shrinking of the hematoma. Make sure the bandage is not wrapped too tight.   If your hematoma is on a lower extremity and is painful, crutches may be helpful for a couple days.   Only take over-the-counter or prescription medicines as directed by your health care provider. SEEK IMMEDIATE MEDICAL CARE IF:   You have increasing pain, or your pain is not controlled with medicine.   You have a fever.   You have worsening swelling or discoloration.   Your skin over the hematoma breaks or starts bleeding.   Your hematoma is in your chest or abdomen and you have weakness, shortness of breath, or a change in consciousness.  Your hematoma is on your scalp (caused by a fall or injury) and you have a worsening headache or a change in alertness or consciousness. MAKE SURE YOU:   Understand these instructions.  Will watch your condition.  Will get help right away if you are not doing well or get worse. Document Released: 10/12/2003 Document Revised: 10/30/2012 Document Reviewed: 08/07/2012 [employer] Patient Information 2014 [employer].  

## 2013-05-11 NOTE — ED Provider Notes (Signed)
  Face-to-face evaluation   History: She fell 3 days ago. She is able to walk since. Related injury to the left lower leg  Physical exam: Left proximal tibia with 10 cm, swollen area, consistent with ecchymosis. Normal range of motion left knee and ankle.   Medical screening examination/treatment/procedure(s) were conducted as a shared visit with non-physician practitioner(s) and myself.  I personally evaluated the patient during the encounter  Flint MelterElliott L Tori Dattilio, MD 05/11/13 [phone]

## 2013-05-15 ENCOUNTER — Ambulatory Visit (INDEPENDENT_AMBULATORY_CARE_PROVIDER_SITE_OTHER): Payer: Medicaid Other

## 2013-05-15 DIAGNOSIS — I4891 Unspecified atrial fibrillation: Secondary | ICD-10-CM

## 2013-05-15 LAB — POCT INR: INR: 2.7

## 2013-06-06 ENCOUNTER — Encounter: Payer: Self-pay | Admitting: Cardiovascular Disease

## 2013-06-06 ENCOUNTER — Ambulatory Visit (INDEPENDENT_AMBULATORY_CARE_PROVIDER_SITE_OTHER): Payer: Medicaid Other | Admitting: Cardiovascular Disease

## 2013-06-06 VITALS — BP 156/78 | HR 58 | Ht 63.0 in | Wt 131.1 lb

## 2013-06-06 DIAGNOSIS — I129 Hypertensive chronic kidney disease with stage 1 through stage 4 chronic kidney disease, or unspecified chronic kidney disease: Secondary | ICD-10-CM

## 2013-06-06 DIAGNOSIS — I4891 Unspecified atrial fibrillation: Secondary | ICD-10-CM

## 2013-06-06 DIAGNOSIS — E785 Hyperlipidemia, unspecified: Secondary | ICD-10-CM

## 2013-06-06 NOTE — Progress Notes (Signed)
HPI:  78 year old woman presenting for followup evaluation. She has end-stage renal disease. Her cardiac issues include malignant hypertension and paroxysmal atrial fibrillation. She's been intolerant to many blood pressure medications.   She denies chest pain or pressure. She's had no edema. Reports rare palpitations. Overall has been well-controlled on amiodarone. BP has been better. She doesn't take antihypertensive on HD days.   Outpatient Encounter Prescriptions as of 06/06/2013  Medication Sig  . amiodarone (PACERONE) 100 MG tablet Take 1 tablet (100 mg total) by mouth 2 (two) times daily.  . felodipine (PLENDIL) 2.5 MG 24 hr tablet Take 1 tablet (2.5 mg total) by mouth 2 (two) times daily.  . hydrALAZINE (APRESOLINE) 25 MG tablet Take 1 tablet (25 mg total) by mouth 2 (two) times daily.  . iron polysaccharides (NIFEREX) 150 MG capsule Take 150 mg by mouth 2 (two) times daily.  Marland Kitchen. latanoprost (XALATAN) 0.005 % ophthalmic solution Place 1 drop into both eyes at bedtime.  . ranitidine (ZANTAC) 150 MG tablet Take 150 mg by mouth at bedtime.  Marland Kitchen. SYNTHROID 137 MCG tablet Take 1 tablet (137 mcg total) by mouth daily before breakfast.  . warfarin (COUMADIN) 5 MG tablet Take 2.5-5 mg by mouth daily. Tuesday Thursday Sunday patient takes 5 mg all the other day are 2 .5 mg ( 1/2 tab of 5 mg)  . HYDROcodone-acetaminophen (NORCO/VICODIN) 5-325 MG per tablet Take 1 tablet by mouth every 6 (six) hours as needed for moderate pain.    Allergies  Allergen Reactions  . Cardizem [Diltiazem Hcl] Hives  . Codeine Other (See Comments)    dizziness  . Iron     ?? Almost died.  Dennie Maizes. Pacerone [Amiodarone] Other (See Comments)    Visual changes with generic Amiodarone, the pt can only take brand name Pacerone  . Rena-Vite [B-Plex] Other (See Comments)    Unknown reaction  . Vancomycin Other (See Comments)    dizziness  . Venofer [Iron Sucrose (Iron Oxide Saccharated)] Itching  . Latex Itching and Rash     Past Medical History  Diagnosis Date  . ESRD (end stage renal disease)     HD for 1 year, HD on Tues, Thurs, Sat // Likely secondary to nephrosclerosis from HTN  . HTN (hypertension)   . HLD (hyperlipidemia)   . Cholelithiases     S/P biliary colic cholecystostomy, but not cholecystectomy secondary to omental adhesions  . Anxiety   . Anemia     BL Hgb 8-9. Likely AOCD in setting of ESRD with iron 48, ferritin  746 (03/2010)  . PAF (paroxysmal atrial fibrillation)     On chronic coumadin therapy // Followed by Dr. Excell Seltzerooper (cardiology)  . Non-ST elevation MI (NSTEMI)     Noninvasive evaluation with Myoview negative for ischemia.  // 2-D echo (05/2010) LVEF 65-70%. Grade 1 diastolic dysfunction.  Peak PA pressure 52.  Marland Kitchen. Hyperthyroidism DX: 07/2010    Likely Grave's Disease. // On diagnosis, TSH < 0.08, free T4 1.94, free T3 5.3 . //  Thyroid US (07/17/2010) - nonspecific diffuse heterogenous thyroid gland. //  Thyrotropin receptor antibody neg (07/17/2010)  . Atrial flutter   . Anticoagulant long-term use    BP 156/78  Pulse 58  Ht 5\' 3"  (1.6 m)  Wt 131 lb 1.9 oz (59.476 kg)  BMI 23.23 kg/m2  PHYSICAL EXAM: Pt is alert and oriented, pleasant elderly woman in NAD HEENT: normal Neck: JVP - normal Lungs: CTA bilaterally CV: RRR without murmur or gallop Abd:  soft, NT, Positive BS, no hepatomegaly Ext: Trace edema bilaterally, distal pulses intact and equal Skin: warm/dry no rash  ASSESSMENT AND PLAN: 1. Paroxysmal atrial fibrillation. She is maintaining sinus rhythm on amiodarone. Will continue the same. She is tolerating warfarin for anticoagulation.  2. Malignant hypertension. Well controlled (better than in past) on current regimen. Multiple drug intolerances. Continue same Rx.  Tonny Bollman 06/06/2013 6:17 PM

## 2013-06-06 NOTE — Patient Instructions (Addendum)
Your physician recommends that you continue on your current medications as directed. Please refer to the Current Medication list given to you today.  Your physician recommends that you return for lab work on Wed. April 8 for liver function test/cholesterol screening  Your physician wants you to follow-up in: 6 months with Dr. Excell Seltzerooper.  You will receive a reminder letter in the mail two months in advance. If you don't receive a letter, please call our office to schedule the follow-up appointment.

## 2013-06-10 ENCOUNTER — Other Ambulatory Visit: Payer: Self-pay | Admitting: *Deleted

## 2013-06-10 MED ORDER — AMIODARONE HCL 100 MG PO TABS: 100.0000 mg | ORAL_TABLET | Freq: Two times a day (BID) | ORAL | Status: DC

## 2013-06-18 ENCOUNTER — Ambulatory Visit (INDEPENDENT_AMBULATORY_CARE_PROVIDER_SITE_OTHER): Payer: Medicaid Other | Admitting: *Deleted

## 2013-06-18 ENCOUNTER — Other Ambulatory Visit (INDEPENDENT_AMBULATORY_CARE_PROVIDER_SITE_OTHER): Payer: Medicaid Other

## 2013-06-18 DIAGNOSIS — Z5181 Encounter for therapeutic drug level monitoring: Secondary | ICD-10-CM | POA: Insufficient documentation

## 2013-06-18 DIAGNOSIS — E785 Hyperlipidemia, unspecified: Secondary | ICD-10-CM

## 2013-06-18 DIAGNOSIS — I4891 Unspecified atrial fibrillation: Secondary | ICD-10-CM

## 2013-06-18 LAB — HEPATIC FUNCTION PANEL
ALK PHOS: 78 U/L (ref 39–117)
ALT: 11 U/L (ref 0–35)
AST: 15 U/L (ref 0–37)
Albumin: 3.8 g/dL (ref 3.5–5.2)
Bilirubin, Direct: 0 mg/dL (ref 0.0–0.3)
TOTAL PROTEIN: 7.6 g/dL (ref 6.0–8.3)
Total Bilirubin: 0.5 mg/dL (ref 0.3–1.2)

## 2013-06-18 LAB — LIPID PANEL
Cholesterol: 199 mg/dL (ref 0–200)
HDL: 63.9 mg/dL (ref >39.00)
LDL Cholesterol: 106 mg/dL — ABNORMAL HIGH (ref 0–99)
Total CHOL/HDL Ratio: 3
Triglycerides: 147 mg/dL (ref 0.0–149.0)
VLDL: 29.4 mg/dL (ref 0.0–40.0)

## 2013-06-18 LAB — POCT INR: INR: 2.4

## 2013-07-14 ENCOUNTER — Ambulatory Visit (INDEPENDENT_AMBULATORY_CARE_PROVIDER_SITE_OTHER): Payer: Medicaid Other | Admitting: Internal Medicine

## 2013-07-14 ENCOUNTER — Encounter: Payer: Self-pay | Admitting: Internal Medicine

## 2013-07-14 VITALS — BP 184/65 | HR 52 | Temp 98.0°F | Wt 131.7 lb

## 2013-07-14 DIAGNOSIS — H04123 Dry eye syndrome of bilateral lacrimal glands: Secondary | ICD-10-CM

## 2013-07-14 DIAGNOSIS — I129 Hypertensive chronic kidney disease with stage 1 through stage 4 chronic kidney disease, or unspecified chronic kidney disease: Secondary | ICD-10-CM

## 2013-07-14 DIAGNOSIS — E538 Deficiency of other specified B group vitamins: Secondary | ICD-10-CM

## 2013-07-14 DIAGNOSIS — H04129 Dry eye syndrome of unspecified lacrimal gland: Secondary | ICD-10-CM

## 2013-07-14 DIAGNOSIS — N186 End stage renal disease: Secondary | ICD-10-CM

## 2013-07-14 DIAGNOSIS — I120 Hypertensive chronic kidney disease with stage 5 chronic kidney disease or end stage renal disease: Secondary | ICD-10-CM

## 2013-07-14 DIAGNOSIS — R0982 Postnasal drip: Secondary | ICD-10-CM

## 2013-07-14 MED ORDER — SALINE SPRAY 0.65 % NA SOLN: 2.0000 | NASAL | Status: DC | PRN

## 2013-07-14 NOTE — Patient Instructions (Addendum)
-  Use the saline nasal spray 4 times per day.  -Follow up in 3 months for routine check up.    Thank you for bringing your medicines today. This helps us keep you safe from mistakes.

## 2013-07-15 LAB — VITAMIN B12: VITAMIN B 12: 388 pg/mL (ref 211–911)

## 2013-07-16 DIAGNOSIS — H04123 Dry eye syndrome of bilateral lacrimal glands: Secondary | ICD-10-CM | POA: Insufficient documentation

## 2013-07-16 DIAGNOSIS — R0982 Postnasal drip: Secondary | ICD-10-CM | POA: Insufficient documentation

## 2013-07-16 MED ORDER — CYCLOSPORINE 0.05 % OP EMUL: 1.0000 [drp] | Freq: Two times a day (BID) | OPHTHALMIC | Status: AC

## 2013-07-16 NOTE — Progress Notes (Signed)
   Subjective:    Patient ID: [name], female    DOB: 06/26/1935, 78 y.o.   MRN: [id number]  Cough Associated symptoms include postnasal drip. Pertinent negatives include no chest pain, chills, ear pain, fever, rash, rhinorrhea, sore throat or shortness of breath.   [name] is a 78 yr old woman with PMH significant for ESRD on HD T/Th/Sat, who comes in accompanied by her husband, for evaluation of a cough with post-nasal drip that has been present off and on for years. She states that for years she has had seasonal allergies, with dry eyes, and post-nasal drip that triggers a cough. She has been seen by Opthalmology for her dry eyes and has been using artificial teats drops as well as Restasis with some improvement of her dry eyes. Her dry eyes and post-nasal drip thought get worse after HD as she often sits near an air vent or fan that dries out her nose and her eyes. She denies recent nosebleeds but feels like her nose is dry inside.  Her cough is only present when post-nasal drip phlegm moves down her throat and appears to be worse at night. She has not taken anything for the cough or for the post-nasal drip.    Review of Systems  Constitutional: Negative for fever, chills, diaphoresis, appetite change, fatigue and unexpected weight change.  HENT: Positive for postnasal drip. Negative for congestion, ear pain, hearing loss, nosebleeds, rhinorrhea, sinus pressure, sneezing, sore throat, tinnitus, trouble swallowing and voice change.   Eyes: Negative for pain.       Dry eyes   Respiratory: Positive for cough. Negative for shortness of breath.   Cardiovascular: Negative for chest pain.  Gastrointestinal: Negative for abdominal pain.  Skin: Negative for rash.  Neurological: Negative for dizziness and light-headedness.  Psychiatric/Behavioral: Negative for confusion and agitation.       Objective:   Physical Exam  Nursing note and vitals reviewed. Constitutional: She is  oriented to person, place, and time. She appears well-developed and well-nourished. No distress.  HENT:  Mouth/Throat: Oropharynx is clear and moist. No oropharyngeal exudate.  Bilateral pale nasal turbinates with no deviated septum, no discharge.   Eyes: Right eye exhibits no discharge. Left eye exhibits no discharge. No scleral icterus.  Bilateral conjunctival erythema   Cardiovascular: Normal rate.   Pulmonary/Chest: Effort normal and breath sounds normal. No respiratory distress. She has no wheezes. She has no rales.  Abdominal: Soft.  Neurological: She is alert and oriented to person, place, and time.  Skin: Skin is warm and dry. She is not diaphoretic.  Psychiatric: She has a normal mood and affect.          Assessment & Plan:

## 2013-07-16 NOTE — Assessment & Plan Note (Signed)
She has a cough triggered by post-nasal drip. The post-nasal drip in part is triggered by air conditioning/dry air which she exposed to at least 3 times per week during HD.  Given her hx of dry eyes, will avoid any antihistamine--the patient agrees.  Rx saline nasal spray 4 times daily to keep her nasal passages moist and to loosen up nasal secretions.  She will use Lozenges as needed for cough.

## 2013-07-16 NOTE — Assessment & Plan Note (Signed)
BP Readings from Last 3 Encounters:  07/14/13 184/65  06/06/13 156/78  05/11/13 167/56    Lab Results  Component Value Date   NA 143 04/21/2011   K 4.2 04/21/2011   CREATININE 4.49* 04/21/2011    Assessment: Blood pressure control:  Not controlled Progress toward BP goal:   Not at goal Comments: She is on felodipine 2.5mg  BID, hydralazine 25mg  BID, and amiodarone 100mg  BID according to her current med list but did not bring her bottles to this visit and states that some of these meds are held prior to HD sometimes. Her BP was elevated during this visit, she was asymptomatic with HD the next day. (has BP checks 3 times per week during HD).   Plan: Medications:  continue current medications. She will continue to follow up with Nephrology and Cardiology for hypertension management.  Educational resources provided:   Self management tools provided:   Other plans: [facility]

## 2013-07-16 NOTE — Assessment & Plan Note (Signed)
She is followed by Ophthalmology for dye eyes, reports being on Restasis for this problem.

## 2013-07-16 NOTE — Assessment & Plan Note (Addendum)
She had B12 supplementation last year, was supposed to come back for a B12 level after that but never returned.  Ordered B12 level which resulted after this visit at 388, nl/low. She has had nl/low B12 in the past but with high methylmalonic acid level, indicating B12 deficiency.  She denies weakness, fatigue, or other s/s of B21 deficiency at this time.  Will continue monitoring and start B12 supplementation if she has s/s of deficiency.

## 2013-07-17 NOTE — Progress Notes (Signed)
Case discussed with Dr. Kennerly at the time of the visit.  We reviewed the resident's history and exam and pertinent patient test results.  I agree with the assessment, diagnosis, and plan of care documented in the resident's note. 

## 2013-07-31 ENCOUNTER — Ambulatory Visit (INDEPENDENT_AMBULATORY_CARE_PROVIDER_SITE_OTHER): Payer: Medicaid Other

## 2013-07-31 DIAGNOSIS — Z5181 Encounter for therapeutic drug level monitoring: Secondary | ICD-10-CM

## 2013-07-31 DIAGNOSIS — I4891 Unspecified atrial fibrillation: Secondary | ICD-10-CM

## 2013-07-31 LAB — POCT INR: INR: 2

## 2013-07-31 MED ORDER — WARFARIN SODIUM 5 MG PO TABS: ORAL_TABLET | ORAL | Status: DC

## 2013-08-05 ENCOUNTER — Telehealth: Payer: Self-pay | Admitting: Cardiovascular Disease

## 2013-08-05 DIAGNOSIS — I4891 Unspecified atrial fibrillation: Secondary | ICD-10-CM

## 2013-08-05 NOTE — Telephone Encounter (Signed)
New message      Heart rate has been high for the last 4-5 days----it was 89 yesterday--took medication and it continued to go up.  Please advise

## 2013-08-05 NOTE — Telephone Encounter (Signed)
Recommend a 24 hour holter monitor. thx

## 2013-08-05 NOTE — Telephone Encounter (Signed)
I spoke with the pt's husband and he said for the past 4-5 days the pt's heart rate has been higher (80-110). The pt's heart rate will increase and she "does not feel well". She will lay down for a few hours and things will settle down and then she can do her normal activities and then she may have another spell and have to lay down again. The pt has not had any changes with her dialysis.  I will forward this message to Dr Excell Seltzer for review.

## 2013-08-06 NOTE — Telephone Encounter (Signed)
Order placed for holter monitor. I spoke with the pt's husband and he said that at this time he would like to hold off on holter monitor.  He said over the past 2-3 days that the pt's pulse has started to decrease and it is running in the 50s and 60s at this time.  I made him aware that if the pt starts to have increased heart rates then we would need to proceed with monitor.  He will call back if needed. Order cancelled at this time.

## 2013-08-26 ENCOUNTER — Telehealth: Payer: Self-pay | Admitting: Cardiovascular Disease

## 2013-08-26 ENCOUNTER — Encounter (HOSPITAL_COMMUNITY): Payer: Self-pay | Admitting: Emergency Medicine

## 2013-08-26 DIAGNOSIS — Z992 Dependence on renal dialysis: Secondary | ICD-10-CM | POA: Insufficient documentation

## 2013-08-26 DIAGNOSIS — R011 Cardiac murmur, unspecified: Secondary | ICD-10-CM | POA: Insufficient documentation

## 2013-08-26 DIAGNOSIS — Z8659 Personal history of other mental and behavioral disorders: Secondary | ICD-10-CM | POA: Insufficient documentation

## 2013-08-26 DIAGNOSIS — Z8719 Personal history of other diseases of the digestive system: Secondary | ICD-10-CM | POA: Insufficient documentation

## 2013-08-26 DIAGNOSIS — Z862 Personal history of diseases of the blood and blood-forming organs and certain disorders involving the immune mechanism: Secondary | ICD-10-CM | POA: Insufficient documentation

## 2013-08-26 DIAGNOSIS — I120 Hypertensive chronic kidney disease with stage 5 chronic kidney disease or end stage renal disease: Secondary | ICD-10-CM | POA: Insufficient documentation

## 2013-08-26 DIAGNOSIS — Z7901 Long term (current) use of anticoagulants: Secondary | ICD-10-CM | POA: Insufficient documentation

## 2013-08-26 DIAGNOSIS — I252 Old myocardial infarction: Secondary | ICD-10-CM | POA: Insufficient documentation

## 2013-08-26 DIAGNOSIS — N186 End stage renal disease: Secondary | ICD-10-CM | POA: Insufficient documentation

## 2013-08-26 DIAGNOSIS — R002 Palpitations: Secondary | ICD-10-CM | POA: Insufficient documentation

## 2013-08-26 DIAGNOSIS — Z79899 Other long term (current) drug therapy: Secondary | ICD-10-CM | POA: Insufficient documentation

## 2013-08-26 DIAGNOSIS — Z91040 Latex allergy status: Secondary | ICD-10-CM | POA: Insufficient documentation

## 2013-08-26 DIAGNOSIS — I4891 Unspecified atrial fibrillation: Secondary | ICD-10-CM | POA: Insufficient documentation

## 2013-08-26 DIAGNOSIS — E0590 Thyrotoxicosis, unspecified without thyrotoxic crisis or storm: Secondary | ICD-10-CM | POA: Insufficient documentation

## 2013-08-26 LAB — COMPREHENSIVE METABOLIC PANEL
ALT: 12 U/L (ref 0–35)
AST: 18 U/L (ref 0–37)
Albumin: 3.5 g/dL (ref 3.5–5.2)
Alkaline Phosphatase: 73 U/L (ref 39–117)
BUN: 16 mg/dL (ref 6–23)
CALCIUM: 8.8 mg/dL (ref 8.4–10.5)
CO2: 31 meq/L (ref 19–32)
Chloride: 94 mEq/L — ABNORMAL LOW (ref 96–112)
Creatinine, Ser: 3.72 mg/dL — ABNORMAL HIGH (ref 0.50–1.10)
GFR calc Af Amer: 13 mL/min — ABNORMAL LOW (ref >90)
GFR calc non Af Amer: 11 mL/min — ABNORMAL LOW (ref >90)
Glucose, Bld: 106 mg/dL — ABNORMAL HIGH (ref 70–99)
Potassium: 4.7 mEq/L (ref 3.7–5.3)
SODIUM: 137 meq/L (ref 137–147)
Total Bilirubin: 0.3 mg/dL (ref 0.3–1.2)
Total Protein: 7.2 g/dL (ref 6.0–8.3)

## 2013-08-26 LAB — CBC WITH DIFFERENTIAL/PLATELET
BASOS ABS: 0.1 10*3/uL (ref 0.0–0.1)
Basophils Relative: 1 % (ref 0–1)
EOS PCT: 4 % (ref 0–5)
Eosinophils Absolute: 0.2 10*3/uL (ref 0.0–0.7)
HCT: 33.2 % — ABNORMAL LOW (ref 36.0–46.0)
Hemoglobin: 10.5 g/dL — ABNORMAL LOW (ref 12.0–15.0)
LYMPHS PCT: 13 % (ref 12–46)
Lymphs Abs: 0.6 10*3/uL — ABNORMAL LOW (ref 0.7–4.0)
MCH: 27.3 pg (ref 26.0–34.0)
MCHC: 31.6 g/dL (ref 30.0–36.0)
MCV: 86.2 fL (ref 78.0–100.0)
Monocytes Absolute: 0.3 10*3/uL (ref 0.1–1.0)
Monocytes Relative: 8 % (ref 3–12)
NEUTROS PCT: 74 % (ref 43–77)
Neutro Abs: 3.2 10*3/uL (ref 1.7–7.7)
PLATELETS: 186 10*3/uL (ref 150–400)
RBC: 3.85 MIL/uL — ABNORMAL LOW (ref 3.87–5.11)
RDW: 17.4 % — AB (ref 11.5–15.5)
WBC: 4.3 10*3/uL (ref 4.0–10.5)

## 2013-08-26 NOTE — ED Notes (Signed)
Pt. reports intermittent palpitations , HR = 111/min at home onset this morning , denies chest pain or SOB , received hemodialysis today .

## 2013-08-26 NOTE — Telephone Encounter (Signed)
New problem   Pt need to speak to nurse concerning an appt that is needed. Please call

## 2013-08-26 NOTE — Telephone Encounter (Signed)
I spoke with the pt's husband and the pt is still having issues with her heart rate going up and down.  In the previous phone note Dr Excell Seltzerooper had recommended that the pt wear a 24 hour holter monitor.  I will place order for this test and have a scheduler contact the pt with an appointment.

## 2013-08-26 NOTE — Telephone Encounter (Signed)
Left message on machine for pt to contact the office.   

## 2013-08-27 ENCOUNTER — Emergency Department (HOSPITAL_COMMUNITY)
Admission: EM | Admit: 2013-08-27 | Discharge: 2013-08-27 | Disposition: A | Discharge status: Home / Self Care | Payer: Medicaid Other | Attending: Emergency Medicine | Admitting: Emergency Medicine

## 2013-08-27 DIAGNOSIS — R002 Palpitations: Secondary | ICD-10-CM

## 2013-08-27 LAB — I-STAT TROPONIN, ED: Troponin i, poc: 0.01 ng/mL (ref 0.00–0.08)

## 2013-08-27 NOTE — ED Notes (Signed)
Pt states 4 hours after she came home from dialysis she began to feel bad. Pt states her heart rate at home was 76 but she didn't feel right.

## 2013-08-27 NOTE — ED Notes (Signed)
Pt c/o palpitations, denies any sob, or cp. Pt has had palpitations before, she states she takes a heart medicine and it usually goes away but today she took 2 and it didn't. Pt states she feels the palpitations when she moves. HR currently 76 and irregular.

## 2013-08-27 NOTE — ED Provider Notes (Signed)
CSN: [id number]     Arrival date & time 08/26/13  2026 History   First MD Initiated Contact with Patient 08/27/13 0029     Chief Complaint  Patient presents with  . Palpitations     (Consider location/radiation/quality/duration/timing/severity/associated sxs/prior Treatment) HPI Comments: Pt with hx of ESRD on HD and afib on coumadin comes in with cc of palpitations. Reports that lately she has been having palpitations, especially with ambulating. Sometimes during the dialysis her HR gets elevated as well. Pt feels "shaky in the inside" with these episodes, but she denies any chest pain, dib, dizziness, diaphoresis, or fainting. Pt has been taking her meds as prescribed, and there are no new changes. She reports that with the medicine, and whilst resting, her HR returns to the 60s-70s range - which is where her HR is during my evaluation.   The history is provided by the patient and medical records.    Past Medical History  Diagnosis Date  . ESRD (end stage renal disease)     HD for 1 year, HD on Tues, Thurs, Sat // Likely secondary to nephrosclerosis from HTN  . HTN (hypertension)   . HLD (hyperlipidemia)   . Cholelithiases     S/P biliary colic cholecystostomy, but not cholecystectomy secondary to omental adhesions  . Anxiety   . Anemia     BL Hgb 8-9. Likely AOCD in setting of ESRD with iron 48, ferritin  746 (03/2010)  . PAF (paroxysmal atrial fibrillation)     On chronic coumadin therapy // Followed by Dr. Excell Seltzerooper (cardiology)  . Non-ST elevation MI (NSTEMI)     Noninvasive evaluation with Myoview negative for ischemia.  // 2-D echo (05/2010) LVEF 65-70%. Grade 1 diastolic dysfunction.  Peak PA pressure 52.  Marland Kitchen. Hyperthyroidism DX: 07/2010    Likely Grave's Disease. // On diagnosis, TSH < 0.08, free T4 1.94, free T3 5.3 . //  Thyroid US (07/17/2010) - nonspecific diffuse heterogenous thyroid gland. //  Thyrotropin receptor antibody neg (07/17/2010)  . Atrial flutter   .  Anticoagulant long-term use    Past Surgical History  Procedure Laterality Date  . Other surgical history  02/2009    Cholecystostomy secondary to biliary colic - NO cholecystectomy secondary to omental adhesions - by Dr. Bertram SavinAmber Allen  . Gallbladder surgery    . Breast lumpectomy      right  . [place] artery biopsy  12/14/10   Family History  Problem Relation Age of Onset  . Stroke    . Heart attack     History  Substance Use Topics  . Smoking status: Never Smoker   . Smokeless tobacco: Not on file  . Alcohol Use: No   OB History   Grav Para Term Preterm Abortions TAB SAB Ect Mult Living                 Review of Systems  Constitutional: Negative for fever, activity change and fatigue.  Respiratory: Negative for shortness of breath.   Cardiovascular: Positive for palpitations. Negative for chest pain.  Gastrointestinal: Negative for nausea, vomiting and abdominal pain.  Genitourinary: Negative for dysuria.  Musculoskeletal: Negative for neck pain.  Neurological: Negative for headaches.  Hematological: Bruises/bleeds easily.      Allergies  Cardizem; Codeine; Iron; Pacerone; Rena-vite; Vancomycin; Venofer; and Latex  Home Medications   Prior to Admission medications   Medication Sig Start Date End Date Taking? Authorizing Provider  amiodarone (PACERONE) 100 MG tablet Take 1 tablet (100 mg total) by  mouth 2 (two) times daily. 06/10/13  Yes Tonny BollmanMichael Cooper, MD  cycloSPORINE (RESTASIS) 0.05 % ophthalmic emulsion Place 1 drop into both eyes 2 (two) times daily. 07/16/13  Yes Ky BarbanSolianny D Kennerly, MD  felodipine (PLENDIL) 2.5 MG 24 hr tablet Take 1 tablet (2.5 mg total) by mouth 2 (two) times daily. 04/14/13  Yes Tonny BollmanMichael Cooper, MD  hydrALAZINE (APRESOLINE) 25 MG tablet Take 50 mg by mouth 2 (two) times daily.   Yes Historical Provider, MD  iron polysaccharides (NIFEREX) 150 MG capsule Take 150 mg by mouth 2 (two) times daily.   Yes Historical Provider, MD  ranitidine (ZANTAC) 150  MG tablet Take 150 mg by mouth at bedtime.   Yes Historical Provider, MD  SYNTHROID 137 MCG tablet Take 1 tablet (137 mcg total) by mouth daily before breakfast. 05/05/13  Yes Reather LittlerAjay Kumar, MD  warfarin (COUMADIN) 5 MG tablet Take 2.5-5 mg by mouth daily. Take 2.5mg  on Monday, Wednesday and Friday.  Take 5mg  on Tuesday, Thursday, Saturday and Sunday   Yes Historical Provider, MD   BP 156/65  Pulse 71  Temp(Src) 97.9 F (36.6 C) (Oral)  Resp 10  SpO2 96% Physical Exam  Nursing note and vitals reviewed. Constitutional: She is oriented to person, place, and time. She appears well-developed and well-nourished.  HENT:  Head: Normocephalic and atraumatic.  Eyes: EOM are normal. Pupils are equal, round, and reactive to light.  Neck: Neck supple.  Cardiovascular: Normal rate and regular rhythm.   Murmur heard. Pulmonary/Chest: Effort normal. No respiratory distress.  Abdominal: Soft. She exhibits no distension. There is no tenderness. There is no rebound and no guarding.  Neurological: She is alert and oriented to person, place, and time.  Skin: Skin is warm and dry.    ED Course  Procedures (including critical care time) Labs Review Labs Reviewed  CBC WITH DIFFERENTIAL - Abnormal; Notable for the following:    RBC 3.85 (*)    Hemoglobin 10.5 (*)    HCT 33.2 (*)    RDW 17.4 (*)    Lymphs Abs 0.6 (*)    All other components within normal limits  COMPREHENSIVE METABOLIC PANEL - Abnormal; Notable for the following:    Chloride 94 (*)    Glucose, Bld 106 (*)    Creatinine, Ser 3.72 (*)    GFR calc non Af Amer 11 (*)    GFR calc Af Amer 13 (*)    All other components within normal limits  I-STAT TROPOININ, ED    Imaging Review No results found.   EKG Interpretation None      MDM   Final diagnoses:  Palpitations   Pt comes in with cc of palpitations. Hx of Paroxysmal Afib, on coumadin and ESRD on HD.  Pt's HR is normal. Seems like palpitations usually provoked by  ambulation, and responds to her meds. HR at the highest was 111. No chest pain, dib, near syncope.  Pt had prolonged tele monitoring in the ER, and we ambulated her - with her HR staying in the 70s. Has Cards appt soon, where she might be getting holter monitoring, which should be helpful. Will d.c.  Derwood KaplanAnkit Greogry Goodwyn, MD 08/27/13 [phone]

## 2013-08-27 NOTE — Discharge Instructions (Signed)
We saw you in the ER for the palpitations. All the results in the ER are normal, and our Cardiac monitoring has been normal as well. We are not sure what is causing your symptoms. It is prudent that you see Cardiologist as planned. The workup in the ER is not complete, and is limited to screening for life threatening and emergent conditions only, so please see a primary care doctor for further evaluation.   Atrial Fibrillation Atrial fibrillation is a type of irregular heart rhythm (arrhythmia). During atrial fibrillation, the upper chambers of the heart (atria) quiver continuously in a chaotic pattern. This causes an irregular and often rapid heart rate.  Atrial fibrillation is the result of the heart becoming overloaded with disorganized signals that tell it to beat. These signals are normally released one at a time by a part of the right atrium called the sinoatrial node. They then travel from the atria to the lower chambers of the heart (ventricles), causing the atria and ventricles to contract and pump blood as they pass. In atrial fibrillation, parts of the atria outside of the sinoatrial node also release these signals. This results in two problems. First, the atria receive so many signals that they do not have time to fully contract. Second, the ventricles, which can only receive one signal at a time, beat irregularly and out of rhythm with the atria.  There are three types of atrial fibrillation:   Paroxysmal. Paroxysmal atrial fibrillation starts suddenly and stops on its own within a week.  Persistent. Persistent atrial fibrillation lasts for more than a week. It may stop on its own or with treatment.  Permanent. Permanent atrial fibrillation does not go away. Episodes of atrial fibrillation may lead to permanent atrial fibrillation. Atrial fibrillation can prevent your heart from pumping blood normally. It increases your risk of stroke and can lead to heart failure.  CAUSES   Heart  conditions, including a heart attack, heart failure, coronary artery disease, and heart valve conditions.   Inflammation of the sac that surrounds the heart (pericarditis).  Blockage of an artery in the lungs (pulmonary embolism).  Pneumonia or other infections.  Chronic lung disease.  Thyroid problems, especially if the thyroid is overactive (hyperthyroidism).  Caffeine, excessive alcohol use, and use of some illegal drugs.   Use of some medicines, including certain decongestants and diet pills.  Heart surgery.   Birth defects.  Sometimes, no cause can be found. When this happens, the atrial fibrillation is called lone atrial fibrillation. The risk of complications from atrial fibrillation increases if you have lone atrial fibrillation and you are age 76 years or older. RISK FACTORS  Heart failure.  Coronary artery disease.  Diabetes mellitus.   High blood pressure (hypertension).   Obesity.   Other arrhythmias.   Increased age. SYMPTOMS   A feeling that your heart is beating rapidly or irregularly.   A feeling of discomfort or pain in your chest.   Shortness of breath.   Sudden light-headedness or weakness.   Getting tired easily when exercising.   Urinating more often than normal (mainly when atrial fibrillation first begins).  In paroxysmal atrial fibrillation, symptoms may start and suddenly stop. DIAGNOSIS  Your health care provider may be able to detect atrial fibrillation when taking your pulse. Your health care provider may have you take a test called an ambulatory electrocardiogram (ECG). An ECG records your heartbeat patterns over a 24-hour period. You may also have other tests, such as:  Transthoracic echocardiogram (  TTE). During echocardiography, sound waves are used to evaluate how blood flows through your heart.  Transesophageal echocardiogram (TEE).  Stress test. There is more than one type of stress test. If a stress test is  needed, ask your health care provider about which type is best for you.   Chest X-ray exam.  Blood tests.  Computed tomography (CT). TREATMENT  Treatment may include:  Treating any underlying conditions. For example, if you have an overactive thyroid, treating the condition may correct atrial fibrillation.  Taking medicine. Medicines may be given to control a rapid heart rate or to prevent blood clots, heart failure, or a stroke.  Having a procedure to correct the rhythm of the heart:  Electrical cardioversion. During electrical cardioversion, a controlled, low-energy shock is delivered to the heart through your skin. If you have chest pain, very low blood pressure, or sudden heart failure, this procedure may need to be done as an emergency.  Catheter ablation. During this procedure, heart tissues that send the signals that cause atrial fibrillation are destroyed.  Maze or minimaze procedure. During this surgery, thin lines of heart tissue that carry the abnormal signals are destroyed. The maze procedure is an open-heart surgery. The minimaze procedure is a minimally invasive surgery. This means that small cuts are made to access the heart instead of a large opening.  Pulmonary venous isolation. During this surgery, tissue around the veins that carry blood from the lungs (pulmonary veins) is destroyed. This tissue is thought to carry the abnormal signals. HOME CARE INSTRUCTIONS   Only take medicines that your health care provider approves, and take them as directed. Some medicines can make atrial fibrillation worse or recur.  If blood thinners were prescribed by your health care provider, take them exactly as directed. Too much blood-thinning medicine can cause bleeding. If you take too little, you will not have the needed protection against stroke and other problems.  Perform blood tests at home if directed by your health care provider. Perform blood tests exactly as directed.  Quit  smoking if you smoke.  Do not drink alcohol.  Do not drink caffeinated beverages such as coffee, soda, and some teas. You may drink decaffeinated coffee, soda, or tea.   Maintain a healthy weight.Do not use diet pills unless your health care provider approves. They may make heart problems worse.   Follow diet instructions as directed by your health care provider.  Exercise regularly as directed by your health care provider.  Keep all follow-up appointments with your health care provider. PREVENTION  The following substances can cause atrial fibrillation to recur:   Caffeinated beverages.  Alcohol.  Certain medicines, especially those used for breathing problems.  Certain herbs and herbal medicines, such as those containing ephedra or ginseng.  Illegal drugs, such as cocaine and amphetamines. Sometimes medicines are given to prevent atrial fibrillation from recurring. Proper treatment of any underlying condition is also important in helping prevent recurrence.  SEEK MEDICAL CARE IF:  You notice a change in the rate, rhythm, or strength of your heartbeat.  You suddenly begin urinating more frequently.  You tire more easily when exerting yourself or exercising. SEEK IMMEDIATE MEDICAL CARE IF:   You have chest pain, abdominal pain, sweating, or weakness.  You feel nauseous.  You have shortness of breath.  You suddenly have swollen feet and ankles.  You feel dizzy.  Your face or limbs feel numb or weak.  You have a change in your vision or speech. MAKE SURE  YOU:   Understand these instructions.  Will watch your condition.  Will get help right away if you are not doing well or get worse. Document Released: 02/27/2005 Document Revised: 03/04/2013 Document Reviewed: 04/09/2012 [facility] Patient Information 2015 [place], [place]. This information is not intended to replace advice given to you by your health care provider. Make sure you discuss any questions you have  with your health care provider. Palpitations  A palpitation is the feeling that your heartbeat is irregular or is faster than normal. It may feel like your heart is fluttering or skipping a beat. Palpitations are usually not a serious problem. However, in some cases, you may need further medical evaluation. CAUSES  Palpitations can be caused by:  Smoking.  Caffeine or other stimulants, such as diet pills or energy drinks.  Alcohol.  Stress and anxiety.  Strenuous physical activity.  Fatigue.  Certain medicines.  Heart disease, especially if you have a history of arrhythmias. This includes atrial fibrillation, atrial flutter, or supraventricular tachycardia.  An improperly working pacemaker or defibrillator. DIAGNOSIS  To find the cause of your palpitations, your caregiver will take your history and perform a physical exam. Tests may also be done, including:  Electrocardiography (ECG). This test records the heart's electrical activity.  Cardiac monitoring. This allows your caregiver to monitor your heart rate and rhythm in real time.  Holter monitor. This is a portable device that records your heartbeat and can help diagnose heart arrhythmias. It allows your caregiver to track your heart activity for several days, if needed.  Stress tests by exercise or by giving medicine that makes the heart beat faster. TREATMENT  Treatment of palpitations depends on the cause of your symptoms and can vary greatly. Most cases of palpitations do not require any treatment other than time, relaxation, and monitoring your symptoms. Other causes, such as atrial fibrillation, atrial flutter, or supraventricular tachycardia, usually require further treatment. HOME CARE INSTRUCTIONS   Avoid:  Caffeinated coffee, tea, soft drinks, diet pills, and energy drinks.  Chocolate.  Alcohol.  Stop smoking if you smoke.  Reduce your stress and anxiety. Things that can help you relax include:  A method  that measures bodily functions so you can learn to control them (biofeedback).  Yoga.  Meditation.  Physical activity such as swimming, jogging, or walking.  Get plenty of rest and sleep. SEEK MEDICAL CARE IF:   You continue to have a fast or irregular heartbeat beyond 24 hours.  Your palpitations occur more often. SEEK IMMEDIATE MEDICAL CARE IF:  You develop chest pain or shortness of breath.  You have a severe headache.  You feel dizzy, or you faint. MAKE SURE YOU:  Understand these instructions.  Will watch your condition.  Will get help right away if you are not doing well or get worse. Document Released: 02/25/2000 Document Revised: 06/24/2012 Document Reviewed: 04/28/2011 [facility] Patient Information 2014 [place], [place].

## 2013-08-27 NOTE — ED Notes (Addendum)
Pt ambulated down the hall with no difficulty. O2 sats stayed 96-99%, HR 73 no sob.

## 2013-08-27 NOTE — ED Notes (Signed)
Discharge instructions reviewed with pt. Pt verbalized understanding.   

## 2013-09-01 ENCOUNTER — Encounter: Payer: Self-pay | Admitting: *Deleted

## 2013-09-01 ENCOUNTER — Ambulatory Visit (INDEPENDENT_AMBULATORY_CARE_PROVIDER_SITE_OTHER): Payer: Medicaid Other | Admitting: Pharmacist

## 2013-09-01 ENCOUNTER — Encounter (INDEPENDENT_AMBULATORY_CARE_PROVIDER_SITE_OTHER): Payer: Medicaid Other

## 2013-09-01 DIAGNOSIS — I4891 Unspecified atrial fibrillation: Secondary | ICD-10-CM

## 2013-09-01 DIAGNOSIS — Z5181 Encounter for therapeutic drug level monitoring: Secondary | ICD-10-CM

## 2013-09-01 LAB — POCT INR: INR: 1.8

## 2013-09-01 NOTE — Progress Notes (Signed)
Patient ID: [name], female   DOB: 02/11/1936, 78 y.o.   MRN: [id number] E-Cardio 24 hour holter monitor applied to patient.

## 2013-09-10 ENCOUNTER — Telehealth: Payer: Self-pay

## 2013-09-10 ENCOUNTER — Other Ambulatory Visit: Payer: Self-pay

## 2013-09-10 MED ORDER — AMIODARONE HCL 100 MG PO TABS: 200.0000 mg | ORAL_TABLET | Freq: Two times a day (BID) | ORAL | Status: DC

## 2013-09-10 NOTE — Telephone Encounter (Signed)
Per Dr Excell Seltzerooper:  Monitor shows Sinus Rhythm with PAF with RVR. Recommend increase Amiodarone to 200mg  twice a day.  At this time the pt would like to continue to get 100mg  tablets and take two tablets twice a day , Rx sent to pharmacy.  The pt has had problems tolerating medications in the past.  The pt also can only take Pacerone, no Amiodarone substitution.

## 2013-09-15 ENCOUNTER — Telehealth: Payer: Self-pay | Admitting: *Deleted

## 2013-09-15 NOTE — Telephone Encounter (Signed)
[facility]

## 2013-09-22 ENCOUNTER — Telehealth: Payer: Self-pay | Admitting: Cardiovascular Disease

## 2013-09-22 NOTE — Telephone Encounter (Signed)
New problem    Pt is having very rapid heart beat and need to speak to nurse. Please call pt.

## 2013-09-22 NOTE — Telephone Encounter (Signed)
Pt states since amiodarone was increased 3 days ago she has had stomach pain and cough.

## 2013-09-22 NOTE — Telephone Encounter (Signed)
Pt decreased dose of  amiodarone to 100mg  two times a day 2 days ago. Pt states her stomach pain and cough resolved. Pt states her heart rate stays around 83 at rest  but when she walks it goes up and she feels like she is going to pass out. Pt states her BP goes down when she goes to dialysis. Pt states this has been going on for about 3 weeks, increasing the amiodarone dose did not help. She states she cannot tolerate a higher dose of amiodarone.   I will forward to Dr Excell Seltzerooper for review.

## 2013-09-24 NOTE — Telephone Encounter (Signed)
Recommend trial metoprolol 12.5 mg BID hold prior to dialysis

## 2013-09-25 MED ORDER — METOPROLOL TARTRATE 25 MG PO TABS: ORAL_TABLET | ORAL | Status: DC

## 2013-09-25 MED ORDER — AMIODARONE HCL 100 MG PO TABS: 100.0000 mg | ORAL_TABLET | Freq: Two times a day (BID) | ORAL | Status: DC

## 2013-09-25 NOTE — Telephone Encounter (Signed)
I spoke with the pt and her husband and made them aware of medication recommendation.  They both verbalized understanding of medication instructions. Prescription sent to the pharmacy.

## 2013-10-06 ENCOUNTER — Ambulatory Visit (INDEPENDENT_AMBULATORY_CARE_PROVIDER_SITE_OTHER): Payer: Medicaid Other

## 2013-10-06 DIAGNOSIS — Z5181 Encounter for therapeutic drug level monitoring: Secondary | ICD-10-CM

## 2013-10-06 DIAGNOSIS — I4891 Unspecified atrial fibrillation: Secondary | ICD-10-CM

## 2013-10-06 LAB — POCT INR: INR: 2.8

## 2013-10-07 ENCOUNTER — Ambulatory Visit
Admission: RE | Admit: 2013-10-07 | Discharge: 2013-10-07 | Disposition: A | Discharge status: Home / Self Care | Payer: Medicaid Other | Source: Ambulatory Visit | Attending: Nephrology | Admitting: Nephrology

## 2013-10-07 ENCOUNTER — Other Ambulatory Visit: Payer: Self-pay | Admitting: Nephrology

## 2013-10-07 DIAGNOSIS — R0602 Shortness of breath: Secondary | ICD-10-CM

## 2013-10-08 ENCOUNTER — Other Ambulatory Visit (INDEPENDENT_AMBULATORY_CARE_PROVIDER_SITE_OTHER): Payer: Medicaid Other

## 2013-10-08 DIAGNOSIS — E890 Postprocedural hypothyroidism: Secondary | ICD-10-CM

## 2013-10-08 LAB — TSH: TSH: 0.56 u[IU]/mL (ref 0.35–4.50)

## 2013-10-08 LAB — T4, FREE: FREE T4: 2.2 ng/dL — AB (ref 0.60–1.60)

## 2013-10-09 ENCOUNTER — Ambulatory Visit (INDEPENDENT_AMBULATORY_CARE_PROVIDER_SITE_OTHER): Payer: Medicaid Other | Admitting: Cardiovascular Disease

## 2013-10-09 ENCOUNTER — Encounter: Payer: Self-pay | Admitting: Cardiovascular Disease

## 2013-10-09 VITALS — BP 144/48 | HR 50 | Ht 63.0 in | Wt 128.0 lb

## 2013-10-09 DIAGNOSIS — I480 Paroxysmal atrial fibrillation: Secondary | ICD-10-CM

## 2013-10-09 DIAGNOSIS — I4891 Unspecified atrial fibrillation: Secondary | ICD-10-CM

## 2013-10-09 NOTE — Patient Instructions (Signed)
Your physician recommends that you continue on your current medications as directed. Please refer to the Current Medication list given to you today.  Your physician wants you to follow-up in: 6 months with Dr. Cooper.  You will receive a reminder letter in the mail two months in advance. If you don't receive a letter, please call our office to schedule the follow-up appointment.  

## 2013-10-09 NOTE — Progress Notes (Signed)
HPI:  78 year old woman presenting for followup evaluation. She has end-stage renal disease. Her cardiac issues include malignant hypertension and paroxysmal atrial fibrillation. She's been intolerant to many blood pressure medications.   The patient walked into the office today with her husband because of concerns over a recent chest x-ray. She has also called in with heart palpitations. I started her back on metoprolol 12.5 mg twice daily and she has responded well to this. She notes that her heart rates have been around 50 beats per minute but she denies symptoms related to this. She does not take metoprolol before dialysis because she has problems with low blood pressure. She denies chest pain or pressure. She has no other complaints today.   Outpatient Encounter Prescriptions as of 10/09/2013  Medication Sig  . amiodarone (PACERONE) 100 MG tablet Take 1 tablet (100 mg total) by mouth 2 (two) times daily.  . cycloSPORINE (RESTASIS) 0.05 % ophthalmic emulsion Place 1 drop into both eyes 2 (two) times daily.  . felodipine (PLENDIL) 2.5 MG 24 hr tablet Take 1 tablet (2.5 mg total) by mouth 2 (two) times daily.  . hydrALAZINE (APRESOLINE) 25 MG tablet Take 50 mg by mouth 2 (two) times daily.  . iron polysaccharides (NIFEREX) 150 MG capsule Take 150 mg by mouth 2 (two) times daily.  . metoprolol tartrate (LOPRESSOR) 25 MG tablet Take one-half tablet by mouth twice a day, do not take dosage on the morning of dialysis  . ranitidine (ZANTAC) 150 MG tablet Take 150 mg by mouth at bedtime.  Marland Kitchen SYNTHROID 137 MCG tablet Take 1 tablet (137 mcg total) by mouth daily before breakfast.  . warfarin (COUMADIN) 5 MG tablet Take 2.5-5 mg by mouth daily. Take 2.5mg  on Monday, Wednesday and Friday.  Take 5mg  on Tuesday, Thursday, Saturday and Sunday    Allergies  Allergen Reactions  . Cardizem [Diltiazem Hcl] Hives  . Codeine Other (See Comments)    dizziness  . Iron     ?? Almost died.  Dennie Maizes  [Amiodarone] Other (See Comments)    Visual changes with generic Amiodarone, the pt can only take brand name Pacerone  . Rena-Vite [B-Plex] Other (See Comments)    Unknown reaction  . Vancomycin Other (See Comments)    dizziness  . Venofer [Iron Sucrose (Iron Oxide Saccharated)] Itching  . Latex Itching and Rash    Past Medical History  Diagnosis Date  . ESRD (end stage renal disease)     HD for 1 year, HD on Tues, Thurs, Sat // Likely secondary to nephrosclerosis from HTN  . HTN (hypertension)   . HLD (hyperlipidemia)   . Cholelithiases     S/P biliary colic cholecystostomy, but not cholecystectomy secondary to omental adhesions  . Anxiety   . Anemia     BL Hgb 8-9. Likely AOCD in setting of ESRD with iron 48, ferritin  746 (03/2010)  . PAF (paroxysmal atrial fibrillation)     On chronic coumadin therapy // Followed by Dr. Excell Seltzer (cardiology)  . Non-ST elevation MI (NSTEMI)     Noninvasive evaluation with Myoview negative for ischemia.  // 2-D echo (05/2010) LVEF 65-70%. Grade 1 diastolic dysfunction.  Peak PA pressure 52.  Marland Kitchen Hyperthyroidism DX: 07/2010    Likely Grave's Disease. // On diagnosis, TSH < 0.08, free T4 1.94, free T3 5.3 . //  Thyroid US (07/17/2010) - nonspecific diffuse heterogenous thyroid gland. //  Thyrotropin receptor antibody neg (07/17/2010)  . Atrial flutter   . Anticoagulant  long-term use     BP 144/48  Pulse 50  Ht 5\' 3"  (1.6 m)  Wt 128 lb (58.06 kg)  BMI 22.68 kg/m2  PHYSICAL EXAM: Pt is alert and oriented, elderly woman in NAD HEENT: normal Neck: JVP - normal Lungs: CTA bilaterally CV: bradycardic and regular without murmur or gallop Abd: soft, NT, Positive BS, no hepatomegaly Ext: no C/C/E, distal pulses intact and equal Skin: warm/dry no rash  Chest x-ray 10/07/2013: FINDINGS:  Cardiomegaly again noted. Again noted is streaky right base  atelectasis or scarring. No pulmonary edema. Probable small left  pleural effusion. Atherosclerotic  calcifications of thoracic aorta.  IMPRESSION:  Streaky right basilar atelectasis or scarring. No pulmonary edema.  Probable small left pleural effusion.  ASSESSMENT AND PLAN: 1. Paroxysmal atrial fibrillation. Continue amiodarone and metoprolol. Anticoagulated with warfarin. No change is recommended today.  2. Malignant hypertension. Blood pressure control is reasonable considering her inability to tolerate almost any antihypertensive medications. This is becoming difficult balance with hypotension occurring during hemodialysis.  The patient's chest x-ray findings were reviewed and I reassured them that there is no evidence of pneumonia or active congestive heart failure on the x-ray.  Tonny BollmanMichael Joycelin Radloff 10/09/2013 3:22 PM

## 2013-10-10 ENCOUNTER — Ambulatory Visit (INDEPENDENT_AMBULATORY_CARE_PROVIDER_SITE_OTHER): Payer: Medicaid Other | Admitting: Endocrinology

## 2013-10-10 ENCOUNTER — Encounter: Payer: Self-pay | Admitting: Endocrinology

## 2013-10-10 VITALS — BP 142/60 | HR 55 | Temp 98.3°F | Resp 16 | Ht 61.25 in | Wt 129.0 lb

## 2013-10-10 DIAGNOSIS — E890 Postprocedural hypothyroidism: Secondary | ICD-10-CM

## 2013-10-10 NOTE — Progress Notes (Signed)
Patient ID: [name], female   DOB: 04-05-1935, 78 y.o.   MRN: [id number]  Reason for Appointment:  Hypothyroidism, followup visit    History of Present Illness:   The hypothyroidism was first diagnosed after her radioactive iodine treatment for her Luiz Blare' disease  in 08/2010   The patient has been treated with various doses of levothyroxine. Her dosage requirement has been quite variable until 2014 The last visit was in 1/15 when her Synthroid was continued with 137 mcg   Patient now has no complaints of unusual fatigue, cold intolerance, dry skin. Her weight does fluctuate and has not gone up since her last visit She tends to get cold in her legs only. Also has had hair loss for quite some time which has not improved.           The patient is taking the thyroid supplement very regularly before lunch. She finds this more convenient because of going for dialysis in the mornings on 3 days Not taking any calcium or iron supplements with the thyroid supplement.    Wt Readings from Last 3 Encounters:  10/10/13 129 lb (58.514 kg)  10/09/13 128 lb (58.06 kg)  07/14/13 131 lb 11.2 oz (59.739 kg)    Lab Results  Component Value Date   FREET4 2.20* 10/08/2013   FREET4 1.57 04/07/2013   FREET4 3.03* 08/31/2010   TSH 0.56 10/08/2013   TSH 0.76 04/07/2013   TSH 3.77 12/13/2011       Medication List       This list is accurate as of: 10/10/13  4:16 PM.  Always use your most recent med list.               amiodarone 100 MG tablet  Commonly known as:  PACERONE  Take 1 tablet (100 mg total) by mouth 2 (two) times daily.     cycloSPORINE 0.05 % ophthalmic emulsion  Commonly known as:  RESTASIS  Place 1 drop into both eyes 2 (two) times daily.     felodipine 2.5 MG 24 hr tablet  Commonly known as:  PLENDIL  Take 1 tablet (2.5 mg total) by mouth 2 (two) times daily.     hydrALAZINE 25 MG tablet  Commonly known as:  APRESOLINE  Take 50 mg by mouth 2 (two) times daily.     iron  polysaccharides 150 MG capsule  Commonly known as:  NIFEREX  Take 150 mg by mouth 2 (two) times daily.     metoprolol tartrate 25 MG tablet  Commonly known as:  LOPRESSOR  Take one-half tablet by mouth twice a day, do not take dosage on the morning of dialysis     ranitidine 150 MG tablet  Commonly known as:  ZANTAC  Take 150 mg by mouth at bedtime.     SYNTHROID 137 MCG tablet  Generic drug:  levothyroxine  Take 1 tablet (137 mcg total) by mouth daily before breakfast.     warfarin 5 MG tablet  Commonly known as:  COUMADIN  Take 2.5-5 mg by mouth daily. Take 2.5mg  on Monday, Wednesday and Friday.  Take 5mg  on Tuesday, Thursday, Saturday and Sunday        Allergies:  Allergies  Allergen Reactions  . Cardizem [Diltiazem Hcl] Hives  . Codeine Other (See Comments)    dizziness  . Iron     ?? Almost died.  [name] [Amiodarone] Other (See Comments)    Visual changes with generic Amiodarone, the pt can only take brand  name Pacerone  . Rena-Vite [B-Plex] Other (See Comments)    Unknown reaction  . Vancomycin Other (See Comments)    dizziness  . Venofer [Iron Sucrose (Iron Oxide Saccharated)] Itching  . Latex Itching and Rash    Past Medical History  Diagnosis Date  . ESRD (end stage renal disease)     HD for 1 year, HD on Tues, Thurs, Sat // Likely secondary to nephrosclerosis from HTN  . HTN (hypertension)   . HLD (hyperlipidemia)   . Cholelithiases     S/P biliary colic cholecystostomy, but not cholecystectomy secondary to omental adhesions  . Anxiety   . Anemia     BL Hgb 8-9. Likely AOCD in setting of ESRD with iron 48, ferritin  746 (03/2010)  . PAF (paroxysmal atrial fibrillation)     On chronic coumadin therapy // Followed by Dr. Excell Seltzerooper (cardiology)  . Non-ST elevation MI (NSTEMI)     Noninvasive evaluation with Myoview negative for ischemia.  // 2-D echo (05/2010) LVEF 65-70%. Grade 1 diastolic dysfunction.  Peak PA pressure 52.  Marland Kitchen. Hyperthyroidism DX:  07/2010    Likely Grave's Disease. // On diagnosis, TSH < 0.08, free T4 1.94, free T3 5.3 . //  Thyroid US (07/17/2010) - nonspecific diffuse heterogenous thyroid gland. //  Thyrotropin receptor antibody neg (07/17/2010)  . Atrial flutter   . Anticoagulant long-term use     Past Surgical History  Procedure Laterality Date  . Other surgical history  02/2009    Cholecystostomy secondary to biliary colic - NO cholecystectomy secondary to omental adhesions - by Dr. Bertram SavinAmber Allen  . Gallbladder surgery    . Breast lumpectomy      right  . Temple artery biopsy  12/14/10    Family History  Problem Relation Age of Onset  . Stroke    . Heart attack      Social History:  reports that she has never smoked. She does not have any smokeless tobacco history on file. She reports that she does not drink alcohol or use illicit drugs.  REVIEW Of SYSTEMS:  She has end-stage kidney disease and is on dialysis History of atrial fibrillation followed by cardiologist   Examination:   BP 142/60  Pulse 55  Temp(Src) 98.3 F (36.8 C)  Resp 16  Ht 5' 1.25" (1.556 m)  Wt 129 lb (58.514 kg)  BMI 24.17 kg/m2  SpO2 96%  GENERAL APPEARANCE: Looks well, no puffiness of the face or eyes She has mild patchy hair loss NEUROLOGIC EXAM:  biceps reflexes show normal relaxation Skin: Appears normal No tremor    Assessment   Hypothyroidism, post ablative with relatively stable TSH level although it is low normal now at 0.56 Not clear why her free T4 is elevated and she does not clinically look hyperthyroid She is clinically doing well except for her chronic problems with alopecia which probably is  unrelated to her hypothyroidism Although she is taking her thyroid supplement before lunch instead of in the morning this seems to not affect her levels   Treatment:  Since she is a history of atrial arrhythmias will have her weekly total dose by half tablet and  followup in 6 months  [name] 10/10/2013,  4:16 PM

## 2013-10-10 NOTE — Patient Instructions (Signed)
Synthoid 1 daily but 1/2 on Sundays

## 2013-10-28 ENCOUNTER — Other Ambulatory Visit: Payer: Self-pay | Admitting: *Deleted

## 2013-10-28 ENCOUNTER — Telehealth: Payer: Self-pay | Admitting: Endocrinology

## 2013-10-28 MED ORDER — SYNTHROID 137 MCG PO TABS: 137.0000 ug | ORAL_TABLET | Freq: Every day | ORAL | Status: DC

## 2013-10-28 NOTE — Telephone Encounter (Signed)
Pt's husband calling regarding med refill called in, it is different - she is allergic. The name of the medicine is levothyroxin.  Pt is not allergic to what she used to be on synthroid

## 2013-10-28 NOTE — Telephone Encounter (Signed)
Patient said she had pain around her neck and her mouth was dry. Requesting name brand synthroid called in.  rx sent

## 2013-11-19 ENCOUNTER — Telehealth: Payer: Self-pay | Admitting: Cardiovascular Disease

## 2013-11-19 ENCOUNTER — Ambulatory Visit (INDEPENDENT_AMBULATORY_CARE_PROVIDER_SITE_OTHER): Payer: Medicaid Other | Admitting: Pharmacist

## 2013-11-19 DIAGNOSIS — Z5181 Encounter for therapeutic drug level monitoring: Secondary | ICD-10-CM

## 2013-11-19 DIAGNOSIS — I4891 Unspecified atrial fibrillation: Secondary | ICD-10-CM

## 2013-11-19 DIAGNOSIS — I480 Paroxysmal atrial fibrillation: Secondary | ICD-10-CM

## 2013-11-19 LAB — POCT INR: INR: 3.1

## 2013-11-19 NOTE — Telephone Encounter (Signed)
Per walk in need to discuss patient pacerone having a problem with this med. Please gave a call when you are back in the office.

## 2013-11-19 NOTE — Telephone Encounter (Signed)
**Note De-Identified [name] Obfuscation** The pt c/o coughing that is worse at night and feels that this is a side effect of Amiodarone. She states that she has been taking Amiodarone 100 mg BID for 3 years. She wants to know what she should do. Please advise.

## 2013-11-19 NOTE — Telephone Encounter (Signed)
She needs to continue amiodarone. Take OTC cough medication as needed. She has MANY side effects to everything she takes, but cannot tolerate atrial fib and there is nothing else we can treat her with.  Tonny Bollman 11/19/2013 5:48 PM

## 2013-11-20 NOTE — Telephone Encounter (Signed)
Will recommend Robitussin DM. Left patient message to call back.

## 2013-11-20 NOTE — Telephone Encounter (Signed)
Patient c/o "terrible coughing." Patient refuses to try OTC cough medicine and says she wants to see Dr. Excell Seltzer ASAP to talk about stopping amiodarone and trying something else for her a-fib.  Patient requesting appointment. Next appointment is not available for two months. Will forward to Leotis Shames, RN and Excell Seltzer for review.

## 2013-11-21 NOTE — Telephone Encounter (Signed)
I am going to open up a clinic next Tuesday if office space is available. Ok to add her on for that day. thx

## 2013-11-24 NOTE — Telephone Encounter (Signed)
Pt scheduled to see Dr Excell Seltzer on 11/25/13.

## 2013-11-25 ENCOUNTER — Encounter: Payer: Self-pay | Admitting: Cardiovascular Disease

## 2013-11-25 ENCOUNTER — Ambulatory Visit (INDEPENDENT_AMBULATORY_CARE_PROVIDER_SITE_OTHER): Payer: Medicaid Other | Admitting: Cardiovascular Disease

## 2013-11-25 VITALS — BP 142/60 | Ht 63.0 in | Wt 130.8 lb

## 2013-11-25 DIAGNOSIS — I4891 Unspecified atrial fibrillation: Secondary | ICD-10-CM

## 2013-11-25 MED ORDER — AMIODARONE HCL 100 MG PO TABS: 100.0000 mg | ORAL_TABLET | Freq: Every day | ORAL | Status: DC

## 2013-11-25 MED ORDER — HYDRALAZINE HCL 25 MG PO TABS: 50.0000 mg | ORAL_TABLET | Freq: Two times a day (BID) | ORAL | Status: DC

## 2013-11-25 NOTE — Progress Notes (Signed)
HPI:   78 year old woman presenting for followup evaluation. She has end-stage renal disease. Her cardiac issues include malignant hypertension and paroxysmal atrial fibrillation. She's been intolerant to many blood pressure medications.   the patient called in and complained of a cough that she attributes to amiodarone. She states when she takes this medication she has a nonproductive cough within 10 minutes of taking it. She is currently taking this 2 times per day. She wants to stop amiodarone. She continues to use metoprolol twice per day except for hemodialysis days. She takes additional metoprolol and her heart rate is fast. The patient denies shortness of breath, sputum production, fevers, chills, orthopnea, leg swelling, or PND. She's had no chest pain.  Outpatient Encounter Prescriptions as of 11/25/2013  Medication Sig  . amiodarone (PACERONE) 100 MG tablet Take 1 tablet (100 mg total) by mouth 2 (two) times daily.  . cycloSPORINE (RESTASIS) 0.05 % ophthalmic emulsion Place 1 drop into both eyes 2 (two) times daily.  . felodipine (PLENDIL) 2.5 MG 24 hr tablet Take 1 tablet (2.5 mg total) by mouth 2 (two) times daily.  . hydrALAZINE (APRESOLINE) 25 MG tablet Take 50 mg by mouth 2 (two) times daily.  . iron polysaccharides (NIFEREX) 150 MG capsule Take 150 mg by mouth 2 (two) times daily.  . metoprolol tartrate (LOPRESSOR) 25 MG tablet Take one-half tablet by mouth twice a day, do not take dosage on the morning of dialysis  . ranitidine (ZANTAC) 150 MG tablet Take 150 mg by mouth at bedtime.  Marland Kitchen SYNTHROID 137 MCG tablet Take 1 tablet (137 mcg total) by mouth daily before breakfast.  . warfarin (COUMADIN) 5 MG tablet Take 2.5-5 mg by mouth daily. Take 2.5mg  on Monday, Wednesday and Friday.  Take  on Tuesday, Thursday, Saturday and Sunday    Allergies  Allergen Reactions  . Cardizem [Diltiazem Hcl] Hives  . Codeine Other (See Comments)    dizziness  . Iron     ?? Almost died.  Dennie Maizes [Amiodarone] Other (See Comments)    Visual changes with generic Amiodarone, the pt can only take brand name Pacerone  . Rena-Vite [B-Plex] Other (See Comments)    Unknown reaction  . Vancomycin Other (See Comments)    dizziness  . Venofer [Iron Sucrose (Iron Oxide Saccharated)] Itching  . Latex Itching and Rash    Past Medical History  Diagnosis Date  . ESRD (end stage renal disease)     HD for 1 year, HD on Tues, Thurs, Sat // Likely secondary to nephrosclerosis from HTN  . HTN (hypertension)   . HLD (hyperlipidemia)   . Cholelithiases     S/P biliary colic cholecystostomy, but not cholecystectomy secondary to omental adhesions  . Anxiety   . Anemia     BL Hgb 8-9. Likely AOCD in setting of ESRD with iron 48, ferritin  746 (03/2010)  . PAF (paroxysmal atrial fibrillation)     On chronic coumadin therapy // Followed by Dr. Excell Seltzer (cardiology)  . Non-ST elevation MI (NSTEMI)     Noninvasive evaluation with Myoview negative for ischemia.  // 2-D echo (05/2010) LVEF 65-70%. Grade 1 diastolic dysfunction.  Peak PA pressure 52.  Marland Kitchen Hyperthyroidism DX: 07/2010    Likely Grave's Disease. // On diagnosis, TSH < 0.08, free T4 1.94, free T3 5.3 . //  Thyroid US (07/17/2010) - nonspecific diffuse heterogenous thyroid gland. //  Thyrotropin receptor antibody neg (07/17/2010)  . Atrial flutter   . Anticoagulant long-term use  ROS: Negative except as per HPI  BP 142/60  Ht  (1.6 m)  Wt 130 lb 12.8 oz (59.33 kg)  BMI 23.18 kg/m2  PHYSICAL EXAM: Pt is alert and oriented, NAD HEENT: normal Neck: JVP - normal, carotids 2+= without bruits Lungs: CTA bilaterally CV: Irregularly irregular without murmur or gallop Abd: soft, NT, Positive BS, no hepatomegaly Ext: no C/C/E, distal pulses intact and equal Skin: warm/dry no rash  EKG:  Atrial tachycardia with variable block heart rate 106 beats per minute.  ASSESSMENT AND PLAN: 1. Paroxysmal atrial fibrillation. She is  convinced that amiodarone is making her cough. I am going to reduce her dose to 100 mg daily. I advised her that her atrial fibrillation uncontrollable if we stop amiodarone. She has had a great deal of difficulty in the past and she is not a candidate for any other antiarrhythmic drugs because of comorbid medical conditions including end-stage renal disease. She is anticoagulated with warfarin. The patient appears to be an atrial tachycardia today. I would like her to return next week for a repeat EKG. May need to consider cardioversion if her arrhythmia persists  2. Malignant hypertension. Blood pressure control is reasonable considering her inability to tolerate almost any antihypertensive medications.   Tonny Bollman MD 11/25/2013 2:24 PM

## 2013-11-25 NOTE — Patient Instructions (Addendum)
Your physician has recommended you make the following change in your medication:  DECREASE Pacerone to once a day  Your physician recommends that you schedule a follow-up appointment in: 1 WEEK for an EKG (12/03/13)  Your physician wants you to follow-up in: January 2016 with Dr Excell Seltzer.  You will receive a reminder letter in the mail two months in advance. If you don't receive a letter, please call our office to schedule the follow-up appointment.

## 2013-12-03 ENCOUNTER — Ambulatory Visit (INDEPENDENT_AMBULATORY_CARE_PROVIDER_SITE_OTHER): Payer: Medicaid Other | Admitting: Cardiovascular Disease

## 2013-12-03 DIAGNOSIS — I4891 Unspecified atrial fibrillation: Secondary | ICD-10-CM

## 2013-12-03 NOTE — Progress Notes (Signed)
This patient came in for a follow up ECG, she was in Sinus Rhythm at 56 bpm. Pt. Denies chest pain currently, states she has pressure and has had a nonproductive cough at times. 12 lead ECG performed and signed off by Dr. Dayle Points. Patient advised her ECG/heart was improved from previous and advised to continue with her current medications and follow up as needed.

## 2013-12-04 ENCOUNTER — Telehealth: Payer: Self-pay | Admitting: *Deleted

## 2013-12-04 MED ORDER — AMIODARONE HCL 100 MG PO TABS: 100.0000 mg | ORAL_TABLET | Freq: Two times a day (BID) | ORAL | Status: DC

## 2013-12-04 NOTE — Telephone Encounter (Signed)
Pt is Pacerone medication dose was decreased to 100 mg once a day on 11/25/13. Pt states that she has been having more episodes for A-fib since this medication was changed from BID to once daily. Pt would like for this medication to be send to the pharmacy for  twice a day. Per Julieta Gutting RN Dr. Earmon Phoenix nurse is okay to change dose back to twice a day. Order was sent to [facility] pharmacy for Pacerone 100 mg twice a day dispense 60 pills and 5 refills. Left pt a message to call back  if any questions.

## 2013-12-04 NOTE — Telephone Encounter (Signed)
Left pt a message to call back. 

## 2013-12-04 NOTE — Telephone Encounter (Signed)
Patients husband called and stated that the patient needs a refill of pacerone. He is requesting that it be for the bid dose as she is having problems on the reduced qd dose. He would like this to be sent to [facility] and requests brand name only. Please advise. Thanks, MI

## 2013-12-05 ENCOUNTER — Ambulatory Visit (INDEPENDENT_AMBULATORY_CARE_PROVIDER_SITE_OTHER): Payer: Medicaid Other | Admitting: Cardiovascular Disease

## 2013-12-05 DIAGNOSIS — Z5181 Encounter for therapeutic drug level monitoring: Secondary | ICD-10-CM

## 2013-12-05 DIAGNOSIS — I4891 Unspecified atrial fibrillation: Secondary | ICD-10-CM

## 2013-12-05 LAB — PROTIME-INR: INR: 3.3 — AB (ref 0.9–1.1)

## 2013-12-11 ENCOUNTER — Ambulatory Visit (INDEPENDENT_AMBULATORY_CARE_PROVIDER_SITE_OTHER): Payer: Medicaid Other | Admitting: *Deleted

## 2013-12-11 DIAGNOSIS — I4891 Unspecified atrial fibrillation: Secondary | ICD-10-CM

## 2013-12-11 DIAGNOSIS — Z5181 Encounter for therapeutic drug level monitoring: Secondary | ICD-10-CM

## 2013-12-11 LAB — POCT INR: INR: 2.9

## 2013-12-22 ENCOUNTER — Ambulatory Visit (INDEPENDENT_AMBULATORY_CARE_PROVIDER_SITE_OTHER): Payer: Medicaid Other | Admitting: Pharmacist

## 2013-12-22 DIAGNOSIS — I4891 Unspecified atrial fibrillation: Secondary | ICD-10-CM

## 2013-12-22 DIAGNOSIS — Z5181 Encounter for therapeutic drug level monitoring: Secondary | ICD-10-CM

## 2013-12-22 LAB — POCT INR: INR: 3.7

## 2014-01-06 ENCOUNTER — Ambulatory Visit (INDEPENDENT_AMBULATORY_CARE_PROVIDER_SITE_OTHER): Payer: Medicaid Other | Admitting: *Deleted

## 2014-01-06 DIAGNOSIS — Z5181 Encounter for therapeutic drug level monitoring: Secondary | ICD-10-CM

## 2014-01-06 DIAGNOSIS — I4891 Unspecified atrial fibrillation: Secondary | ICD-10-CM

## 2014-01-06 LAB — POCT INR: INR: 4.2

## 2014-01-16 ENCOUNTER — Ambulatory Visit (INDEPENDENT_AMBULATORY_CARE_PROVIDER_SITE_OTHER): Payer: Medicaid Other | Admitting: *Deleted

## 2014-01-16 DIAGNOSIS — I4891 Unspecified atrial fibrillation: Secondary | ICD-10-CM

## 2014-01-16 DIAGNOSIS — Z5181 Encounter for therapeutic drug level monitoring: Secondary | ICD-10-CM

## 2014-01-16 LAB — POCT INR: INR: 1.6

## 2014-01-17 ENCOUNTER — Other Ambulatory Visit: Payer: Self-pay | Admitting: Cardiovascular Disease

## 2014-01-26 ENCOUNTER — Telehealth: Payer: Self-pay | Admitting: *Deleted

## 2014-01-26 DIAGNOSIS — I4891 Unspecified atrial fibrillation: Secondary | ICD-10-CM

## 2014-01-26 MED ORDER — HYDRALAZINE HCL 25 MG PO TABS: 25.0000 mg | ORAL_TABLET | Freq: Two times a day (BID) | ORAL | Status: DC

## 2014-01-26 MED ORDER — FELODIPINE ER 2.5 MG PO TB24: 2.5000 mg | ORAL_TABLET | Freq: Two times a day (BID) | ORAL | Status: DC

## 2014-01-26 NOTE — Telephone Encounter (Signed)
Patients husband would like for you to give him a call to discuss why his wifes hydralazine was changed from 25mg  bid to 50mg  bid. Please advise. Thanks, MI

## 2014-01-26 NOTE — Telephone Encounter (Signed)
I spoke with the pt's husband and he said the pt picked up her hydralazine Rx and it says 25mg  take 2 tablets in the morning and 2 tablets in the evening. Per Mr Tommy RainwaterKhanlarian the pt does not take this dosage.  The pt takes one tablet by mouth twice a day.  I advised him to have the pt continue taking one tablet twice a day and I will update her medication list. Updated Rx sent to the pharmacy.

## 2014-02-02 ENCOUNTER — Ambulatory Visit (INDEPENDENT_AMBULATORY_CARE_PROVIDER_SITE_OTHER): Payer: Medicaid Other | Admitting: Pharmacist

## 2014-02-02 DIAGNOSIS — Z5181 Encounter for therapeutic drug level monitoring: Secondary | ICD-10-CM

## 2014-02-02 DIAGNOSIS — I4891 Unspecified atrial fibrillation: Secondary | ICD-10-CM

## 2014-02-02 LAB — POCT INR: INR: 2.1

## 2014-02-16 ENCOUNTER — Encounter: Payer: Self-pay | Admitting: Internal Medicine

## 2014-02-16 ENCOUNTER — Ambulatory Visit (INDEPENDENT_AMBULATORY_CARE_PROVIDER_SITE_OTHER): Payer: Medicaid Other | Admitting: Internal Medicine

## 2014-02-16 VITALS — BP 172/51 | HR 57 | Temp 95.0°F | Wt 134.1 lb

## 2014-02-16 DIAGNOSIS — I129 Hypertensive chronic kidney disease with stage 1 through stage 4 chronic kidney disease, or unspecified chronic kidney disease: Secondary | ICD-10-CM

## 2014-02-16 DIAGNOSIS — J302 Other seasonal allergic rhinitis: Secondary | ICD-10-CM

## 2014-02-16 DIAGNOSIS — R0601 Orthopnea: Secondary | ICD-10-CM

## 2014-02-16 DIAGNOSIS — I482 Chronic atrial fibrillation, unspecified: Secondary | ICD-10-CM

## 2014-02-16 DIAGNOSIS — K5900 Constipation, unspecified: Secondary | ICD-10-CM

## 2014-02-16 DIAGNOSIS — I10 Essential (primary) hypertension: Secondary | ICD-10-CM

## 2014-02-16 DIAGNOSIS — N189 Chronic kidney disease, unspecified: Secondary | ICD-10-CM

## 2014-02-16 MED ORDER — MOMETASONE FUROATE 50 MCG/ACT NA SUSP: 2.0000 | Freq: Every day | NASAL | Status: DC

## 2014-02-16 MED ORDER — SENNA 8.6 MG PO TABS
1.0000 | ORAL_TABLET | Freq: Every day | ORAL | Status: DC
Start: 2014-02-16 — End: 2014-05-18

## 2014-02-16 MED ORDER — DOCUSATE SODIUM 100 MG PO CAPS: 100.0000 mg | ORAL_CAPSULE | Freq: Two times a day (BID) | ORAL | Status: DC

## 2014-02-16 MED ORDER — LORATADINE 10 MG PO TABS: 10.0000 mg | ORAL_TABLET | Freq: Every day | ORAL | Status: DC

## 2014-02-16 NOTE — Patient Instructions (Signed)
General Instructions: -Start taking senna to move your bowels. This is a laxative, don't take it before going to dialysis.  -Take docusate 100mg  tablet twice per day. This is a stool softener.  Follow up in 3 months   Thank you for bringing your medicines today. This helps us keep you safe from mistakes.   Progress Toward Treatment Goals:  Treatment Goal 12/02/2012  Blood pressure deteriorated    Self Care Goals & Plans:  Self Care Goal 12/02/2012  Manage my medications take my medicines as prescribed  Monitor my health -  Eat healthy foods -  Be physically active -    No flowsheet data found.   Care Management & Community Referrals:  Referral 02/16/2014  Referrals made for care management support -  Referrals made to community resources none

## 2014-02-17 DIAGNOSIS — R0601 Orthopnea: Secondary | ICD-10-CM | POA: Insufficient documentation

## 2014-02-17 DIAGNOSIS — K5900 Constipation, unspecified: Secondary | ICD-10-CM | POA: Insufficient documentation

## 2014-02-17 DIAGNOSIS — J302 Other seasonal allergic rhinitis: Secondary | ICD-10-CM | POA: Insufficient documentation

## 2014-02-17 NOTE — Progress Notes (Signed)
   Subjective:    Patient ID: [name], female    DOB: Jun 30, 1935, 78 y.o.   MRN: [id number]  HPI [name] is a 78 yr old woman with PMH of ESRD on HD T/Th/Sat, HTN uncontrolled, A. Fib, who comes in accompanied by her husband for evaluation of "allergy" from amiodarone. She has been taking this medication for four years now but states that this medication is causing her heart to beat "fast" at night, making her bloated and constipated, and giving her shortness of breath/orthopnea at night. Her symptoms have been present for "a long time", more than one year.  She has tried "[employer]" for her constipation with too much cramps and loose stools--she has stopped taking this as she was told this would not be safe for long term use due to her ESRD. She has also tried sorbitol (2 tablespoons per day), Miralax, with not much improvement of her symptoms. Prune juice, milk, have been "forbidden" by her dietitian due to heir high potassium content.    Review of Systems  Constitutional: Negative for fever, chills, activity change, appetite change and fatigue.  Respiratory: Negative for cough, shortness of breath and wheezing.        Occasional orthopnea   Cardiovascular: Negative for chest pain, palpitations and leg swelling.  Gastrointestinal: Positive for constipation. Negative for nausea, vomiting, abdominal pain, diarrhea and blood in stool.  Musculoskeletal: Negative for back pain.  Skin: Negative for rash.  Neurological: Negative for dizziness and light-headedness.  Psychiatric/Behavioral: Negative for agitation.       Objective:   Physical Exam  Constitutional: She is oriented to person, place, and time. She appears well-developed and well-nourished. No distress.  Cardiovascular: Normal rate and regular rhythm.   Pulmonary/Chest: Effort normal and breath sounds normal. No respiratory distress. She has no wheezes. She has no rales.  Abdominal: Soft. She exhibits no distension and  no mass. There is no tenderness. There is no rebound and no guarding.  Hyperactive bowel sounds  Musculoskeletal: She exhibits no edema.  Neurological: She is alert and oriented to person, place, and time.  Skin: Skin is warm and dry. No rash noted. She is not diaphoretic.  Psychiatric: She has a normal mood and affect.  Nursing note and vitals reviewed.         Assessment & Plan:

## 2014-02-17 NOTE — Assessment & Plan Note (Signed)
She has tried sorbitol with no good results. She is on fluid restrictions with dietary restrictions as well to prevent rise on potassium.  Rx colace 100mg  BID as stool softener since she is on iron tx Rx senna 8.6mg  1-2 tablet PRN for constipation as laxative.   She may try other laxatives such as Dulcolax--other options will need to be verified for use with renal impairment

## 2014-02-17 NOTE — Assessment & Plan Note (Addendum)
Intermittent, described as an "allergy" to amiodarone. Will pursue PFTs.  She has hx of postnasal drip with seasonal allergies, Rx Nasonex nasal spray.  Fluid overload due to her ESRD also possible as well as worsening dCHF. If her shortness of breath worsens she may need further cardiac eval--last 2D echo Feb 2013, she is already followed by cards.

## 2014-02-17 NOTE — Assessment & Plan Note (Signed)
BP Readings from Last 3 Encounters:  02/16/14 172/51  11/25/13 142/60  10/10/13 142/60    Lab Results  Component Value Date   NA 137 08/26/2013   K 4.7 08/26/2013   CREATININE 3.72* 08/26/2013    Assessment: Blood pressure control:  not controlled Progress toward BP goal:   not at goal Comments: on metoprolol 12.5 mg BID (not taking the morning of HD), felodipine 2.5mg  daily, hydralazine 25mg  BID. She has had intolerance to several antihypertensives and has had hypotension during HD.    Plan: Medications:  continue current medications Educational resources provided:   Self management tools provided:   Other plans: She will continue following up with HD, Nephrology for BP monitoring and adjustment of her medications as needed.

## 2014-02-17 NOTE — Assessment & Plan Note (Addendum)
She has been on amiodarone for over 4 years. Her symptoms could be caused by her low dietary fiber intake/fluid restriction.  Discussed the importance of her staying on this medication for her rhythm control.  Will try medications to help with her constipation. She understands she will likely need to stay on amiodarone for long-term use.  Of note, she has had CXR in July 2015 with no signs of pulmonary fibrosis but given her long-term use of amio and intermittent SOB/orthopnea, she will need PFTs--may need CT chest high resolution if pulmonary fibrosis is suspected. LFTs and TSH checked in June/July 2015 and wnl.   -Ordered PFTs.

## 2014-02-17 NOTE — Assessment & Plan Note (Signed)
Rx Nasonex

## 2014-02-18 NOTE — Progress Notes (Signed)
INTERNAL MEDICINE TEACHING ATTENDING ADDENDUM - Robinette Esters, MD: I reviewed and discussed at the time of visit with the resident Dr. Kennerly, the patient's medical history, physical examination, diagnosis and results of pertinent tests and treatment and I agree with the patient's care as documented.  

## 2014-02-23 ENCOUNTER — Ambulatory Visit (INDEPENDENT_AMBULATORY_CARE_PROVIDER_SITE_OTHER): Payer: Medicaid Other | Admitting: *Deleted

## 2014-02-23 DIAGNOSIS — I482 Chronic atrial fibrillation, unspecified: Secondary | ICD-10-CM

## 2014-02-23 DIAGNOSIS — I4891 Unspecified atrial fibrillation: Secondary | ICD-10-CM

## 2014-02-23 DIAGNOSIS — Z5181 Encounter for therapeutic drug level monitoring: Secondary | ICD-10-CM

## 2014-02-23 LAB — POCT INR: INR: 2

## 2014-02-26 ENCOUNTER — Encounter: Payer: Self-pay | Admitting: *Deleted

## 2014-03-11 ENCOUNTER — Ambulatory Visit (HOSPITAL_COMMUNITY)
Admission: RE | Admit: 2014-03-11 | Discharge: 2014-03-11 | Disposition: A | Discharge status: Home / Self Care | Payer: Medicaid Other | Source: Ambulatory Visit | Attending: Internal Medicine | Admitting: Internal Medicine

## 2014-03-11 DIAGNOSIS — R0601 Orthopnea: Secondary | ICD-10-CM | POA: Diagnosis present

## 2014-03-11 LAB — PULMONARY FUNCTION TEST
FEF 25-75 Post: 2.21 L/sec
FEF 25-75 Pre: 1.63 L/sec
FEF2575-%CHANGE-POST: 35 %
FEF2575-%PRED-POST: 152 %
FEF2575-%Pred-Pre: 112 %
FEV1-%Change-Post: 8 %
FEV1-%PRED-POST: 50 %
FEV1-%Pred-Pre: 46 %
FEV1-Post: 0.93 L
FEV1-Pre: 0.86 L
FEV1FVC-%Change-Post: -18 %
FEV1FVC-%Pred-Pre: 135 %
FEV6-%Change-Post: 31 %
FEV6-%PRED-PRE: 36 %
FEV6-%Pred-Post: 47 %
FEV6-POST: 1.14 L
FEV6-Pre: 0.86 L
FEV6FVC-%Change-Post: 0 %
FEV6FVC-%PRED-POST: 104 %
FEV6FVC-%Pred-Pre: 105 %
FVC-%CHANGE-POST: 32 %
FVC-%PRED-PRE: 34 %
FVC-%Pred-Post: 45 %
FVC-POST: 1.14 L
FVC-PRE: 0.86 L
PRE FEV1/FVC RATIO: 100 %
PRE FEV6/FVC RATIO: 100 %
Post FEV1/FVC ratio: 82 %
Post FEV6/FVC ratio: 99 %
RV % pred: 63 %
RV: 1.41 L
TLC % pred: 51 %
TLC: 2.45 L

## 2014-03-11 MED ORDER — ALBUTEROL SULFATE (2.5 MG/3ML) 0.083% IN NEBU
2.5000 mg | INHALATION_SOLUTION | Freq: Once | RESPIRATORY_TRACT | Status: AC
  Administered 2014-03-11: 2.5 mg via RESPIRATORY_TRACT

## 2014-03-23 ENCOUNTER — Ambulatory Visit (INDEPENDENT_AMBULATORY_CARE_PROVIDER_SITE_OTHER): Payer: Medicaid Other

## 2014-03-23 ENCOUNTER — Telehealth: Payer: Self-pay | Admitting: *Deleted

## 2014-03-23 DIAGNOSIS — Z5181 Encounter for therapeutic drug level monitoring: Secondary | ICD-10-CM

## 2014-03-23 DIAGNOSIS — I4891 Unspecified atrial fibrillation: Secondary | ICD-10-CM

## 2014-03-23 LAB — POCT INR: INR: 1.7

## 2014-03-23 NOTE — Telephone Encounter (Signed)
Patient and her husband walked into clinic requesting pt's PFT results.  Pt also wanted MD to know that she is still constipated.  Pt and pt's husband would like a call from MD.  Pt was instructed to make an appt -next avail not until 04/28/14, although pt was instructed to contact the clinic if she needed to be seen sooner..Will forward info to pcp-please call pt with results.Kingsley SpittleGoldston, [name]1/11/20163:47 PM

## 2014-03-24 ENCOUNTER — Encounter: Payer: Self-pay | Admitting: *Deleted

## 2014-03-25 ENCOUNTER — Telehealth: Payer: Self-pay | Admitting: Internal Medicine

## 2014-03-25 DIAGNOSIS — R0602 Shortness of breath: Secondary | ICD-10-CM

## 2014-03-25 MED ORDER — FLUTICASONE-SALMETEROL 250-50 MCG/DOSE IN AEPB: 1.0000 | INHALATION_SPRAY | Freq: Two times a day (BID) | RESPIRATORY_TRACT | Status: DC

## 2014-03-25 MED ORDER — ALBUTEROL SULFATE HFA 108 (90 BASE) MCG/ACT IN AERS: 2.0000 | INHALATION_SPRAY | Freq: Four times a day (QID) | RESPIRATORY_TRACT | Status: DC | PRN

## 2014-03-25 NOTE — Telephone Encounter (Signed)
I called them and updated them.   Thanks!  SK

## 2014-03-25 NOTE — Telephone Encounter (Signed)
Received a message from [name] wanting to discuss her PFT results. Her results were somewhat inconclusive due to her fair effort during the test but she did respond to bronchodilators with indication of obstructive disease. She has had DOE for sometime now and may benefit from inhaler tx.   She also was concerned about constipation. She has tried sorbitol (high doses), senokot BID, with no results. She saw Dr. Eliott Nineunham yesterday who recommended OTC "smooth movers" tea. She had good result with this OTC tea and wants to continue taking it.   Sent Rx for Advair and Albuterol inh. They were advised to meet with the Pharmacist to receive instructions on how to use the inhaler--she has never used inhalers before.   The patient and her husband are in agreement with using the inhalers. [facility] and we will discuss her symptoms while using the inhaler and if she needs a Pulmonology referral.    [name], Lauren Ogden D, MD IMTS, PGY3 03/25/14, 3:47 PM

## 2014-04-02 LAB — PROTIME-INR: INR: 1.9 — AB (ref 0.9–1.1)

## 2014-04-06 ENCOUNTER — Ambulatory Visit (INDEPENDENT_AMBULATORY_CARE_PROVIDER_SITE_OTHER): Payer: Self-pay | Admitting: Cardiology

## 2014-04-06 DIAGNOSIS — I4891 Unspecified atrial fibrillation: Secondary | ICD-10-CM

## 2014-04-06 DIAGNOSIS — Z5181 Encounter for therapeutic drug level monitoring: Secondary | ICD-10-CM

## 2014-04-09 ENCOUNTER — Other Ambulatory Visit: Payer: Medicaid Other

## 2014-04-13 ENCOUNTER — Ambulatory Visit (INDEPENDENT_AMBULATORY_CARE_PROVIDER_SITE_OTHER): Payer: Medicaid Other | Admitting: Cardiovascular Disease

## 2014-04-13 ENCOUNTER — Other Ambulatory Visit (INDEPENDENT_AMBULATORY_CARE_PROVIDER_SITE_OTHER): Payer: Medicaid Other

## 2014-04-13 ENCOUNTER — Ambulatory Visit (INDEPENDENT_AMBULATORY_CARE_PROVIDER_SITE_OTHER): Payer: Medicaid Other

## 2014-04-13 ENCOUNTER — Encounter: Payer: Self-pay | Admitting: Cardiovascular Disease

## 2014-04-13 ENCOUNTER — Other Ambulatory Visit: Payer: Self-pay | Admitting: *Deleted

## 2014-04-13 ENCOUNTER — Ambulatory Visit: Payer: Medicaid Other | Admitting: Endocrinology

## 2014-04-13 VITALS — BP 164/60 | HR 58 | Ht 63.0 in | Wt 133.4 lb

## 2014-04-13 DIAGNOSIS — E890 Postprocedural hypothyroidism: Secondary | ICD-10-CM

## 2014-04-13 DIAGNOSIS — I4891 Unspecified atrial fibrillation: Secondary | ICD-10-CM

## 2014-04-13 DIAGNOSIS — E785 Hyperlipidemia, unspecified: Secondary | ICD-10-CM

## 2014-04-13 DIAGNOSIS — Z5181 Encounter for therapeutic drug level monitoring: Secondary | ICD-10-CM

## 2014-04-13 LAB — T4, FREE: Free T4: 1.24 ng/dL (ref 0.60–1.60)

## 2014-04-13 LAB — POCT INR: INR: 2.8

## 2014-04-13 LAB — TSH: TSH: 9.9 u[IU]/mL — AB (ref 0.35–4.50)

## 2014-04-13 NOTE — Patient Instructions (Signed)
Your physician wants you to follow-up in: 6 MONTHS with Dr Excell Seltzerooper.  You will receive a reminder letter in the mail two months in advance. If you don't receive a letter, please call our office to schedule the follow-up appointment.  Your physician recommends that you continue on your current medications as directed. Please refer to the Current Medication list given to you today.  Your physician recommends that you have lab work today: order given to patient (LIVER)

## 2014-04-13 NOTE — Progress Notes (Signed)
Cardiology Office Note   Date:  04/13/2014   ID:  [name], DOB [birth year], MRN [id number]  PCP:  Ky BarbanKennerly, Solianny D, MD  Cardiologist:  Tonny BollmanMichael Giovannina Mun, MD    Chief Complaint  Patient presents with  . Follow-up    6 month     History of Present Illness: [name] is a 79 y.o. female who presents for follow-up evaluation.   She has end-stage renal disease. Her cardiac issues include malignant hypertension and paroxysmal atrial fibrillation. She's been intolerant to many blood pressure medications.  Overall doing ok from cardiac perspective. Tolerating dialysis better and doesn't feel strong heartbeats like she used to experience. No chest pain or palpitations. She does admit to shortness of breath with activity. No lightheadedness or syncope. No headache.   Past Medical History  Diagnosis Date  . ESRD (end stage renal disease)     HD for 1 year, HD on Tues, Thurs, Sat // Likely secondary to nephrosclerosis from HTN  . HTN (hypertension)   . HLD (hyperlipidemia)   . Cholelithiases     S/P biliary colic cholecystostomy, but not cholecystectomy secondary to omental adhesions  . Anxiety   . Anemia     BL Hgb 8-9. Likely AOCD in setting of ESRD with iron 48, ferritin  746 (03/2010)  . PAF (paroxysmal atrial fibrillation)     On chronic coumadin therapy // Followed by Dr. Excell Seltzerooper (cardiology)  . Non-ST elevation MI (NSTEMI)     Noninvasive evaluation with Myoview negative for ischemia.  // 2-D echo (05/2010) LVEF 65-70%. Grade 1 diastolic dysfunction.  Peak PA pressure 52.  Marland Kitchen. Hyperthyroidism DX: 07/2010    Likely Grave's Disease. // On diagnosis, TSH < 0.08, free T4 1.94, free T3 5.3 . //  Thyroid US (07/17/2010) - nonspecific diffuse heterogenous thyroid gland. //  Thyrotropin receptor antibody neg (07/17/2010)  . Atrial flutter   . Anticoagulant long-term use     Past Surgical History  Procedure Laterality Date  . Other surgical history  02/2009   Cholecystostomy secondary to biliary colic - NO cholecystectomy secondary to omental adhesions - by Dr. Bertram SavinAmber Allen  . Gallbladder surgery    . Breast lumpectomy      right  . Temple artery biopsy  12/14/10    Current Outpatient Prescriptions  Medication Sig Dispense Refill  . albuterol (PROVENTIL HFA;VENTOLIN HFA) 108 (90 BASE) MCG/ACT inhaler Inhale 2 puffs into the lungs every 6 (six) hours as needed for wheezing or shortness of breath. 1 Inhaler 2  . amiodarone (PACERONE) 100 MG tablet Take 1 tablet (100 mg total) by mouth 2 (two) times daily. 60 tablet 5  . cycloSPORINE (RESTASIS) 0.05 % ophthalmic emulsion Place 1 drop into both eyes 2 (two) times daily. 0.4 mL 0  . docusate sodium (COLACE) 100 MG capsule Take 1 capsule (100 mg total) by mouth 2 (two) times daily. 60 capsule 3  . felodipine (PLENDIL) 2.5 MG 24 hr tablet Take 1 tablet (2.5 mg total) by mouth 2 (two) times daily. 180 tablet 3  . Fluticasone-Salmeterol (ADVAIR) 250-50 MCG/DOSE AEPB Inhale 1 puff into the lungs 2 (two) times daily. 60 each 3  . hydrALAZINE (APRESOLINE) 25 MG tablet Take 1 tablet (25 mg total) by mouth 2 (two) times daily. 180 tablet 3  . iron polysaccharides (NIFEREX) 150 MG capsule Take 150 mg by mouth 2 (two) times daily.    . metoprolol tartrate (LOPRESSOR) 25 MG tablet Take one-half tablet by mouth twice a day, do  not take dosage on the morning of dialysis 30 tablet 6  . ranitidine (ZANTAC) 150 MG tablet Take 150 mg by mouth at bedtime.    . senna (SENOKOT) 8.6 MG TABS tablet Take 1 tablet (8.6 mg total) by mouth daily. 120 each 0  . SYNTHROID 137 MCG tablet Take 1 tablet (137 mcg total) by mouth daily before breakfast. 30 tablet 5  . warfarin (COUMADIN) 5 MG tablet TAKE AS DIRECTED BY ANTICOAGULATION CLINIC 90 tablet 1   No current facility-administered medications for this visit.    Allergies:   Cardizem; Iron; Rena-vite; Codeine; Pacerone; Vancomycin; Venofer; and Latex   Social History:  The  patient  reports that she has never smoked. She does not have any smokeless tobacco history on file. She reports that she does not drink alcohol or use illicit drugs.   Family History:  The patient's  family history includes Heart attack in an other family member; Stroke in an other family member.    ROS:  Please see the history of present illness.  Otherwise, review of systems is positive for diminished appetite, DOE, cough, depression, back pain, leg pain, and constipation.  All other systems are reviewed and negative.    PHYSICAL EXAM: VS:  BP 164/60 mmHg  Pulse 58  Ht  (1.6 m)  Wt 133 lb 6.4 oz (60.51 kg)  BMI 23.64 kg/m2 , BMI Body mass index is 23.64 kg/(m^2). GEN: Chronically ill-appearing, very pleasant elderly woman, in no acute distress HEENT: normal Neck: no JVD, carotid bruits, or masses Cardiac: RRR without murmur or gallop                No peripheral edema Respiratory:  clear to auscultation bilaterally, normal work of breathing GI: soft, nontender, nondistended, + BS MS: no deformity or atrophy Skin: warm and dry, no rash Neuro:  Strength and sensation are intact Psych: euthymic mood, full affect  EKG:  EKG is ordered today. The ekg ordered today shows Sinus brdaycardia 58 bpm, within normal limits.  Recent Labs: 08/26/2013: ALT 12; BUN 16; Creatinine 3.72*; Hemoglobin 10.5*; Platelets 186; Potassium 4.7; Sodium 137 10/08/2013: TSH 0.56   Lipid Panel     Component Value Date/Time   CHOL 199 06/18/2013 0805   TRIG 147.0 06/18/2013 0805   HDL 63.90 06/18/2013 0805   CHOLHDL 3 06/18/2013 0805   VLDL 29.4 06/18/2013 0805   LDLCALC 106* 06/18/2013 0805   LDLDIRECT 155.1 01/09/2011 0945      Wt Readings from Last 3 Encounters:  04/13/14 133 lb 6.4 oz (60.51 kg)  02/16/14 134 lb 1.6 oz (60.827 kg)  11/25/13 130 lb 12.8 oz (59.33 kg)    ASSESSMENT AND PLAN: 1.  Paroxysmal Atrial Fibrillation: maintaining sinus rhythm on amiodarone. Currently taking 100  mg BID. Appears stable at this time. Tolerating warfarin without bleeding complications. Has Thyroid function studies scheduled today. Will add LFT's for amiodarone monitoring.  2. Malignant HTN: better-controlled on current medical therapy.  3. ESRD   Current medicines are reviewed with the patient today.  The patient does not have concerns regarding medicines.  The following changes have been made:  no change  Labs/ tests ordered today include: LFT's   Disposition:   FU with me in 6 months  Signed, Tonny Bollman, MD  04/13/2014 12:48 PM    [facility] [address] [place], [place], [place]  [address] Phone: [phone]; Fax: [phone]

## 2014-04-23 ENCOUNTER — Encounter: Payer: Self-pay | Admitting: Endocrinology

## 2014-04-23 ENCOUNTER — Ambulatory Visit (INDEPENDENT_AMBULATORY_CARE_PROVIDER_SITE_OTHER): Payer: Medicaid Other | Admitting: Endocrinology

## 2014-04-23 VITALS — BP 167/51 | HR 61 | Temp 98.2°F | Resp 14 | Ht 63.0 in | Wt 131.4 lb

## 2014-04-23 DIAGNOSIS — E890 Postprocedural hypothyroidism: Secondary | ICD-10-CM

## 2014-04-23 MED ORDER — SYNTHROID 150 MCG PO TABS: 150.0000 ug | ORAL_TABLET | Freq: Every day | ORAL | Status: DC

## 2014-04-23 NOTE — Patient Instructions (Signed)
Synthroid BEFORE lunch daily, no iron or calcium at same time

## 2014-04-23 NOTE — Progress Notes (Signed)
Patient ID: [name], female   DOB: 12-21-1935, 79 y.o.   MRN: [id number]  Reason for Appointment:  Hypothyroidism, followup visit    History of Present Illness:   The hypothyroidism was first diagnosed after her radioactive iodine treatment for her Luiz Blare' disease  in 08/2010   The patient has been treated with various doses of levothyroxine. Her dosage requirement had been quite variable until 2014 The last visit was in 7/15 when her Synthroid was continued with 137 mcg, taking 6 1/2 tablets a week since TSH was trending lower   She again has  complaints of feeling fatigue.  She does have some cold intolerance although more in her feet instead of the body; also has some dry skin. Her weight does fluctuate and she has had a decreased appetite lately.  Also has had hair loss for quite some time which has not improved .           The patient is taking the thyroid supplement very regularly with lunch.  She now said that she is taking it after eating with her other medications  She finds this more convenient because of going for dialysis in the mornings on 3 days Not taking any calcium or iron supplements with the thyroid supplement.     Wt Readings from Last 3 Encounters:  04/23/14 131 lb 6.4 oz (59.603 kg)  04/13/14 133 lb 6.4 oz (60.51 kg)  02/16/14 134 lb 1.6 oz (60.827 kg)    Lab Results  Component Value Date   FREET4 1.24 04/13/2014   FREET4 2.20* 10/08/2013   FREET4 1.57 04/07/2013   TSH 9.90* 04/13/2014   TSH 0.56 10/08/2013   TSH 0.76 04/07/2013       Medication List       This list is accurate as of: 04/23/14  1:13 PM.  Always use your most recent med list.               albuterol 108 (90 BASE) MCG/ACT inhaler  Commonly known as:  PROVENTIL HFA;VENTOLIN HFA  Inhale 2 puffs into the lungs every 6 (six) hours as needed for wheezing or shortness of breath.     amiodarone 100 MG tablet  Commonly known as:  PACERONE  Take 1 tablet (100 mg total)  by mouth 2 (two) times daily.     cycloSPORINE 0.05 % ophthalmic emulsion  Commonly known as:  RESTASIS  Place 1 drop into both eyes 2 (two) times daily.     docusate sodium 100 MG capsule  Commonly known as:  COLACE  Take 1 capsule (100 mg total) by mouth 2 (two) times daily.     felodipine 2.5 MG 24 hr tablet  Commonly known as:  PLENDIL  Take 1 tablet (2.5 mg total) by mouth 2 (two) times daily.     Fluticasone-Salmeterol 250-50 MCG/DOSE Aepb  Commonly known as:  ADVAIR  Inhale 1 puff into the lungs 2 (two) times daily.     hydrALAZINE 25 MG tablet  Commonly known as:  APRESOLINE  Take 1 tablet (25 mg total) by mouth 2 (two) times daily.     iron polysaccharides 150 MG capsule  Commonly known as:  NIFEREX  Take 150 mg by mouth 2 (two) times daily.     metoprolol tartrate 25 MG tablet  Commonly known as:  LOPRESSOR  Take one-half tablet by mouth twice a day, do not take dosage on the morning of dialysis     ranitidine 150 MG tablet  Commonly known as:  ZANTAC  Take 150 mg by mouth at bedtime.     senna 8.6 MG Tabs tablet  Commonly known as:  SENOKOT  Take 1 tablet (8.6 mg total) by mouth daily.     SYNTHROID 137 MCG tablet  Generic drug:  levothyroxine  Take 1 tablet (137 mcg total) by mouth daily before breakfast.     warfarin 5 MG tablet  Commonly known as:  COUMADIN  TAKE AS DIRECTED BY ANTICOAGULATION CLINIC        Allergies:  Allergies  Allergen Reactions  . Cardizem [Diltiazem Hcl] Hives  . Iron     ?? Almost died.  . Rena-Vite [B-Plex] Other (See Comments)    Unknown reaction  . Codeine Other (See Comments)    dizziness  . Pacerone [Amiodarone] Other (See Comments)    Visual changes with generic Amiodarone, the pt can only take brand name Pacerone  . Vancomycin Other (See Comments)    dizziness  . Venofer [Iron Sucrose (Iron Oxide Saccharated)] Itching  . Latex Itching and Rash    Past Medical History  Diagnosis Date  . ESRD (end stage  renal disease)     HD for 1 year, HD on Tues, Thurs, Sat // Likely secondary to nephrosclerosis from HTN  . HTN (hypertension)   . HLD (hyperlipidemia)   . Cholelithiases     S/P biliary colic cholecystostomy, but not cholecystectomy secondary to omental adhesions  . Anxiety   . Anemia     BL Hgb 8-9. Likely AOCD in setting of ESRD with iron 48, ferritin  746 (03/2010)  . PAF (paroxysmal atrial fibrillation)     On chronic coumadin therapy // Followed by Dr. Excell Seltzerooper (cardiology)  . Non-ST elevation MI (NSTEMI)     Noninvasive evaluation with Myoview negative for ischemia.  // 2-D echo (05/2010) LVEF 65-70%. Grade 1 diastolic dysfunction.  Peak PA pressure 52.  Marland Kitchen. Hyperthyroidism DX: 07/2010    Likely Grave's Disease. // On diagnosis, TSH < 0.08, free T4 1.94, free T3 5.3 . //  Thyroid US (07/17/2010) - nonspecific diffuse heterogenous thyroid gland. //  Thyrotropin receptor antibody neg (07/17/2010)  . Atrial flutter   . Anticoagulant long-term use     Past Surgical History  Procedure Laterality Date  . Other surgical history  02/2009    Cholecystostomy secondary to biliary colic - NO cholecystectomy secondary to omental adhesions - by Dr. Bertram SavinAmber Allen  . Gallbladder surgery    . Breast lumpectomy      right  . Temple artery biopsy  12/14/10    Family History  Problem Relation Age of Onset  . Stroke    . Heart attack      Social History:  reports that she has never smoked. She does not have any smokeless tobacco history on file. She reports that she does not drink alcohol or use illicit drugs.  REVIEW Of SYSTEMS:  She has end-stage kidney disease and is on dialysis History of atrial fibrillation followed by cardiologist   Examination:   BP 167/51 mmHg  Pulse 61  Temp(Src) 98.2 F (36.8 C)  Resp 14  Ht 5\' 3"  (1.6 m)  Wt 131 lb 6.4 oz (59.603 kg)  BMI 23.28 kg/m2  SpO2 90%   Looks well, minimal puffiness of the face and eyes Neck exam shows no abnormality. NEUROLOGIC  EXAM:  biceps reflexes show normal relaxation Skin: Appears normal    Assessment   Hypothyroidism, post ablative with unusually high TSH  now despite fairly consistent levels previously on her 137 g dosage Difficult to assess her symptoms as she tends to have fatigue chronically. Since she has been compliant with her medication most likely is needing a higher dose now Also discussed that she should try to take the medication before eating rather than after her lunch Clinically looks euthyroid   Treatment:  Brand name Synthroid 150 g daily and follow-up in 2 months  Ronrico Dupin 04/23/2014, 1:13 PM

## 2014-04-28 ENCOUNTER — Encounter: Payer: Medicaid Other | Admitting: Internal Medicine

## 2014-04-29 ENCOUNTER — Ambulatory Visit (INDEPENDENT_AMBULATORY_CARE_PROVIDER_SITE_OTHER): Payer: Medicaid Other | Admitting: *Deleted

## 2014-04-29 DIAGNOSIS — Z5181 Encounter for therapeutic drug level monitoring: Secondary | ICD-10-CM

## 2014-04-29 DIAGNOSIS — I4891 Unspecified atrial fibrillation: Secondary | ICD-10-CM

## 2014-04-29 LAB — POCT INR: INR: 3.4

## 2014-05-05 ENCOUNTER — Encounter: Payer: Medicaid Other | Admitting: Internal Medicine

## 2014-05-07 LAB — PROTIME-INR: INR: 4 — AB (ref 0.9–1.1)

## 2014-05-08 ENCOUNTER — Ambulatory Visit (INDEPENDENT_AMBULATORY_CARE_PROVIDER_SITE_OTHER): Payer: Self-pay | Admitting: Cardiology

## 2014-05-08 DIAGNOSIS — I4891 Unspecified atrial fibrillation: Secondary | ICD-10-CM

## 2014-05-08 DIAGNOSIS — Z5181 Encounter for therapeutic drug level monitoring: Secondary | ICD-10-CM

## 2014-05-18 ENCOUNTER — Other Ambulatory Visit: Payer: Self-pay | Admitting: Cardiovascular Disease

## 2014-05-18 ENCOUNTER — Encounter: Payer: Self-pay | Admitting: Internal Medicine

## 2014-05-18 ENCOUNTER — Ambulatory Visit (INDEPENDENT_AMBULATORY_CARE_PROVIDER_SITE_OTHER): Payer: Medicaid Other | Admitting: Internal Medicine

## 2014-05-18 VITALS — BP 183/54 | HR 65 | Temp 98.0°F | Ht 63.5 in | Wt 132.5 lb

## 2014-05-18 DIAGNOSIS — K5900 Constipation, unspecified: Secondary | ICD-10-CM

## 2014-05-18 DIAGNOSIS — I120 Hypertensive chronic kidney disease with stage 5 chronic kidney disease or end stage renal disease: Secondary | ICD-10-CM

## 2014-05-18 DIAGNOSIS — Z992 Dependence on renal dialysis: Secondary | ICD-10-CM

## 2014-05-18 DIAGNOSIS — N186 End stage renal disease: Secondary | ICD-10-CM

## 2014-05-18 DIAGNOSIS — I10 Essential (primary) hypertension: Secondary | ICD-10-CM

## 2014-05-18 MED ORDER — DOCUSATE SODIUM 100 MG PO CAPS
100.0000 mg | ORAL_CAPSULE | Freq: Two times a day (BID) | ORAL | Status: DC
Start: 2014-05-18 — End: 2015-05-12

## 2014-05-18 NOTE — Assessment & Plan Note (Addendum)
With bloating. Likely due to iron tx.  She wants to try store bought (not blackstrap) Molasses (for the low iron and for the constipation), which is a lower potassium food compared to prune juice. She will take 1 tablespoon per day which is one serving per day instead of prune juice.  -Of note, the patient has had extensive discussion about her diet with her dietitian who has recently allowed her to drink prune juice every other day to help with the constipation. Prune juice has from 700-1200mg  of potassium per cup but store bought molasses, a low-moderate potassium containing food ans has 200mg -293mg  of potassium per serving (re: UpToDate "Foods with low levels of potassium-less than 250 mg potassium per serving on average" and the USDA). She will continue having her potassium monitored during every dialysis session on T/Th/Sat as it has been done for years to ensure her potassium does not rise if she takes molasses.  She will try colace 100mg  BID and follow up with us as needed.  -Of note, no colonoscopy per chart, may need one though she may not be a good candidate for sedation/anesthesia due to her long list of medications reactions/allergies including anaphylaxis.  If the mild TTP of the right upper quadrant worsens or continues, would consider imaging, (US if pain more localized concerning for gall bladder/liver, CT if concerning for diverticulosis/diverticulitis).

## 2014-05-18 NOTE — Assessment & Plan Note (Signed)
BP Readings from Last 3 Encounters:  05/18/14 183/54  04/23/14 167/51  04/13/14 164/60    Lab Results  Component Value Date   NA 137 08/26/2013   K 4.7 08/26/2013   CREATININE 3.72* 08/26/2013    Assessment: Blood pressure control:  not controlled Progress toward BP goal:   not at goal Comments: Her BP has not been very difficult to control, it is managed by her Nephrologist.   Plan: Medications:  continue current medications Educational resources provided:   Self management tools provided:   Other plans: Follow up in 3 months.

## 2014-05-18 NOTE — Progress Notes (Signed)
   Subjective:    Patient ID: [name], female    DOB: [birth year], 79 y.o.   MRN: [id number]  HPI [name] is a 79 yr old woman with PMH of ESRD on HD, anemia of chronic disease, A. Fib on amiodarone and coumadin, presenting accompanied by her husband for follow up visit for constipation. She continues to take iron supplementation and notes that she is persistently bloated and constipated with BM formed to hard every 2-3 days. She was taking an OTC herbal tea that her Nephrologist had recommended but she got tired its flavor. Her nutritionist has allowed her to drink prune juice every other day to help with the constipation but this has helped as much.  She thinks the amiodarone is also contributing to her constipation but her Cardiologist has told her that she may not come off this medication due to her difficult to control A. Fib.   She has not tried colace yet.    Review of Systems  Constitutional: Negative for fever, chills, diaphoresis, activity change, appetite change, fatigue and unexpected weight change.  Respiratory: Negative for cough.   Cardiovascular: Negative for leg swelling.  Gastrointestinal: Positive for constipation. Negative for nausea, vomiting, abdominal pain, diarrhea and anal bleeding.  Neurological: Negative for dizziness and light-headedness.  Psychiatric/Behavioral: Negative for agitation.       Objective:   Physical Exam  Constitutional: She is oriented to person, place, and time. She appears well-developed and well-nourished. No distress.  Cardiovascular: Normal rate.   Pulmonary/Chest: Effort normal. No respiratory distress.  Abdominal: Soft. Bowel sounds are normal. She exhibits no distension. There is tenderness. There is no rebound and no guarding.  Mild TTP at the left upper quadrant  Musculoskeletal: She exhibits no edema.  Neurological: She is alert and oriented to person, place, and time. Coordination normal.  Skin: Skin is warm and dry.  She is not diaphoretic.  Psychiatric: She has a normal mood and affect.  Nursing note and vitals reviewed.         Assessment & Plan:

## 2014-05-18 NOTE — Patient Instructions (Signed)
General Instructions: -You may take 1 tablespoon of molasses per day.  -Start taking colace $Remo [id number]$100mgd may help with bloating and constipation.  -Follow up with us in 3 months or sooner as needed.    Please bring your medicines with you each time you come to clinic.  Medicines may include prescription medications, over-the-counter medications, herbal remedies, eye drops, vitamins, or other pills.   Progress Toward Treatment Goals:  Treatment Goal 12/02/2012  Blood pressure deteriorated    Self Care Goals & Plans:  Self Care Goal 12/02/2012  Manage my medications take my medicines as prescribed  Monitor my health -  Eat healthy foods -  Be physically active -    No flowsheet data found.   Care Management & Community Referrals:  Referral 02/16/2014  Referrals made for care management support -  Referrals made to community resources none

## 2014-05-19 NOTE — Progress Notes (Signed)
Internal Medicine Clinic Attending  Case discussed with Dr. Garald BraverKennerly soon after the resident saw the patient.  We reviewed the resident's history and exam and pertinent patient test results.  I agree with the assessment, diagnosis, and plan of care documented in the resident's note.  Molasses is documented as low K, but actually is considered high K with > 200mg  of K per serving.  If there is concern for this patients K intake given her ESRD, this should not be started without close management by her nephrologist.  Her last labs in our system are from June 2015.  She likely has blood work done at HD.  Would attempt to get regular records from her HD provider.

## 2014-05-22 ENCOUNTER — Ambulatory Visit (INDEPENDENT_AMBULATORY_CARE_PROVIDER_SITE_OTHER): Payer: Medicaid Other | Admitting: Pharmacist

## 2014-05-22 DIAGNOSIS — I4891 Unspecified atrial fibrillation: Secondary | ICD-10-CM

## 2014-05-22 DIAGNOSIS — Z5181 Encounter for therapeutic drug level monitoring: Secondary | ICD-10-CM

## 2014-05-22 LAB — POCT INR: INR: 3.2

## 2014-06-05 ENCOUNTER — Ambulatory Visit (INDEPENDENT_AMBULATORY_CARE_PROVIDER_SITE_OTHER): Payer: Medicaid Other | Admitting: Pharmacist

## 2014-06-05 DIAGNOSIS — I4891 Unspecified atrial fibrillation: Secondary | ICD-10-CM | POA: Diagnosis not present

## 2014-06-05 DIAGNOSIS — Z5181 Encounter for therapeutic drug level monitoring: Secondary | ICD-10-CM

## 2014-06-05 LAB — POCT INR: INR: 2

## 2014-06-17 ENCOUNTER — Other Ambulatory Visit (INDEPENDENT_AMBULATORY_CARE_PROVIDER_SITE_OTHER): Payer: Medicaid Other

## 2014-06-17 ENCOUNTER — Ambulatory Visit (INDEPENDENT_AMBULATORY_CARE_PROVIDER_SITE_OTHER): Payer: Medicaid Other | Admitting: *Deleted

## 2014-06-17 DIAGNOSIS — Z5181 Encounter for therapeutic drug level monitoring: Secondary | ICD-10-CM

## 2014-06-17 DIAGNOSIS — I4891 Unspecified atrial fibrillation: Secondary | ICD-10-CM

## 2014-06-17 DIAGNOSIS — E890 Postprocedural hypothyroidism: Secondary | ICD-10-CM | POA: Diagnosis not present

## 2014-06-17 LAB — TSH: TSH: 3.63 u[IU]/mL (ref 0.35–4.50)

## 2014-06-17 LAB — POCT INR: INR: 3

## 2014-06-22 ENCOUNTER — Ambulatory Visit (INDEPENDENT_AMBULATORY_CARE_PROVIDER_SITE_OTHER): Payer: Medicaid Other | Admitting: Endocrinology

## 2014-06-22 ENCOUNTER — Encounter: Payer: Self-pay | Admitting: Endocrinology

## 2014-06-22 VITALS — BP 134/50 | HR 65 | Temp 98.6°F | Ht 63.5 in | Wt 127.5 lb

## 2014-06-22 DIAGNOSIS — E890 Postprocedural hypothyroidism: Secondary | ICD-10-CM | POA: Diagnosis not present

## 2014-06-22 NOTE — Progress Notes (Signed)
Patient ID: [name], female   DOB: 02-19-1936, 79 y.o.   MRN: [id number]  Reason for Appointment:  Hypothyroidism, followup visit    History of Present Illness:   The hypothyroidism was first diagnosed after her radioactive iodine treatment for her [name]' disease  in 08/2010   The patient has been treated with various doses of levothyroxine. Her dosage requirement had been quite variable until 2014   She again has  complaints of feeling fatigue.   She does have some cold intolerance and is dressed rather  Warmly today despite moderate temperatures  Her weight does fluctuate  And she appears to have lost some weight Also has had hair loss for quite some time which has not improved , also asking about itching in her hair .           The patient is taking the thyroid supplement regularly with lunch.  She now said that she is taking it just before eating with her other medications , sometimes afterwards  She finds this more convenient because of going for dialysis in the mornings on 3 days Not taking any calcium or iron supplements with the thyroid supplement.    Her dose was increased to 150 g in 2/16 but all of high TSH of 9.9 but she did not feel any difference in her energy level subsequently.     Wt Readings from Last 3 Encounters:  06/22/14 127 lb 8 oz (57.834 kg)  05/18/14 132 lb 8 oz (60.102 kg)  04/23/14 131 lb 6.4 oz (59.603 kg)    Lab Results  Component Value Date   FREET4 1.24 04/13/2014   FREET4 2.20* 10/08/2013   FREET4 1.57 04/07/2013   TSH 3.63 06/17/2014   TSH 9.90* 04/13/2014   TSH 0.56 10/08/2013       Medication List       This list is accurate as of: 06/22/14  4:24 PM.  Always use your most recent med list.               albuterol 108 (90 BASE) MCG/ACT inhaler  Commonly known as:  PROVENTIL HFA;VENTOLIN HFA  Inhale 2 puffs into the lungs every 6 (six) hours as needed for wheezing or shortness of breath.     cycloSPORINE 0.05 %  ophthalmic emulsion  Commonly known as:  RESTASIS  Place 1 drop into both eyes 2 (two) times daily.     docusate sodium 100 MG capsule  Commonly known as:  COLACE  Take 1 capsule (100 mg total) by mouth 2 (two) times daily.     felodipine 2.5 MG 24 hr tablet  Commonly known as:  PLENDIL  Take 1 tablet (2.5 mg total) by mouth 2 (two) times daily.     Fluticasone-Salmeterol 250-50 MCG/DOSE Aepb  Commonly known as:  ADVAIR  Inhale 1 puff into the lungs 2 (two) times daily.     hydrALAZINE 25 MG tablet  Commonly known as:  APRESOLINE  Take 1 tablet (25 mg total) by mouth 2 (two) times daily.     iron polysaccharides 150 MG capsule  Commonly known as:  NIFEREX  Take 150 mg by mouth 2 (two) times daily.     POLY-IRON 150 PO  Take 150 mg by mouth daily.     metoprolol tartrate 25 MG tablet  Commonly known as:  LOPRESSOR  Take one-half tablet by mouth twice a day, do not take dosage on the morning of dialysis     PACERONE 100  MG tablet  Generic drug:  amiodarone  TAKE ONE TABLET BY MOUTH TWICE DAILY     ranitidine 150 MG tablet  Commonly known as:  ZANTAC  Take 150 mg by mouth at bedtime.     SYNTHROID 150 MCG tablet  Generic drug:  levothyroxine  Take 1 tablet (150 mcg total) by mouth daily before breakfast.     warfarin 5 MG tablet  Commonly known as:  COUMADIN  TAKE AS DIRECTED BY ANTICOAGULATION CLINIC        Allergies:  Allergies  Allergen Reactions  . Cardizem [Diltiazem Hcl] Hives  . Iron     ?? Almost died.  . Rena-Vite [B-Plex] Other (See Comments)    Unknown reaction  . Codeine Other (See Comments)    dizziness  . Pacerone [Amiodarone] Other (See Comments)    Visual changes with generic Amiodarone, the pt can only take brand name Pacerone  . Vancomycin Other (See Comments)    dizziness  . Venofer [Iron Sucrose (Iron Oxide Saccharated)] Itching  . Latex Itching and Rash    Past Medical History  Diagnosis Date  . ESRD (end stage renal disease)      HD for 1 year, HD on Tues, Thurs, Sat // Likely secondary to nephrosclerosis from HTN  . HTN (hypertension)   . HLD (hyperlipidemia)   . Cholelithiases     S/P biliary colic cholecystostomy, but not cholecystectomy secondary to omental adhesions  . Anxiety   . Anemia     BL Hgb 8-9. Likely AOCD in setting of ESRD with iron 48, ferritin  746 (03/2010)  . PAF (paroxysmal atrial fibrillation)     On chronic coumadin therapy // Followed by Dr. Excell Seltzer (cardiology)  . Non-ST elevation MI (NSTEMI)     Noninvasive evaluation with Myoview negative for ischemia.  // 2-D echo (05/2010) LVEF 65-70%. Grade 1 diastolic dysfunction.  Peak PA pressure 52.  Marland Kitchen Hyperthyroidism DX: 07/2010    Likely Grave's Disease. // On diagnosis, TSH < 0.08, free T4 1.94, free T3 5.3 . //  Thyroid US (07/17/2010) - nonspecific diffuse heterogenous thyroid gland. //  Thyrotropin receptor antibody neg (07/17/2010)  . Atrial flutter   . Anticoagulant long-term use     Past Surgical History  Procedure Laterality Date  . Other surgical history  02/2009    Cholecystostomy secondary to biliary colic - NO cholecystectomy secondary to omental adhesions - by Dr. Bertram Savin  . Gallbladder surgery    . Breast lumpectomy      right  . Temple artery biopsy  12/14/10    Family History  Problem Relation Age of Onset  . Stroke    . Heart attack      Social History:  reports that she has never smoked. She does not have any smokeless tobacco history on file. She reports that she does not drink alcohol or use illicit drugs.  REVIEW Of SYSTEMS:  She has end-stage kidney disease and is on dialysis History of atrial fibrillation followed by cardiologist   Examination:   BP 134/50 mmHg  Pulse 65  Temp(Src) 98.6 F (37 C) (Oral)  Ht 5' 3.5" (1.613 m)  Wt 127 lb 8 oz (57.834 kg)  BMI 22.23 kg/m2  SpO2 92%   Looks well, minimal puffiness of the eyes   Thyroid not palpable NEUROLOGIC EXAM:  biceps reflexes show normal  relaxation Skin: Appears normal    Assessment   Hypothyroidism, post ablative with relatively high thyroxine requirement despite her weight of  only 127 pounds      Although she is compliant with her medication she is sometimes taking this with her lunch meal and may have some food interaction. Difficult to assess her symptoms as she tends to have fatigue chronically.  Clinically looks euthyroid and her TSH is back to normal with 150 micrograms levothyroxine dose   Treatment:   continue Brand name Synthroid 150 g daily and follow-up in  6 months  [name] 06/22/2014, 4:24 PM

## 2014-06-22 NOTE — Progress Notes (Signed)
Pre visit review using our clinic review tool, if applicable. No additional management support is needed unless otherwise documented below in the visit note. 

## 2014-06-22 NOTE — Patient Instructions (Signed)
Take Synthroid 60 min before lunch if possibe

## 2014-06-23 ENCOUNTER — Other Ambulatory Visit: Payer: Self-pay | Admitting: Nephrology

## 2014-06-23 ENCOUNTER — Ambulatory Visit
Admission: RE | Admit: 2014-06-23 | Discharge: 2014-06-23 | Disposition: A | Discharge status: Home / Self Care | Payer: Medicaid Other | Source: Ambulatory Visit | Attending: Nephrology | Admitting: Nephrology

## 2014-06-23 DIAGNOSIS — R058 Other specified cough: Secondary | ICD-10-CM

## 2014-06-23 DIAGNOSIS — R05 Cough: Secondary | ICD-10-CM

## 2014-07-08 ENCOUNTER — Ambulatory Visit (INDEPENDENT_AMBULATORY_CARE_PROVIDER_SITE_OTHER): Payer: Medicaid Other | Admitting: *Deleted

## 2014-07-08 DIAGNOSIS — Z5181 Encounter for therapeutic drug level monitoring: Secondary | ICD-10-CM

## 2014-07-08 DIAGNOSIS — I4891 Unspecified atrial fibrillation: Secondary | ICD-10-CM | POA: Diagnosis not present

## 2014-07-08 LAB — POCT INR: INR: 2.1

## 2014-07-24 ENCOUNTER — Encounter: Payer: Self-pay | Admitting: *Deleted

## 2014-07-28 ENCOUNTER — Ambulatory Visit
Admission: RE | Admit: 2014-07-28 | Discharge: 2014-07-28 | Disposition: A | Discharge status: Home / Self Care | Payer: Medicaid Other | Source: Ambulatory Visit | Attending: Nephrology | Admitting: Nephrology

## 2014-07-28 ENCOUNTER — Other Ambulatory Visit: Payer: Self-pay | Admitting: Nephrology

## 2014-07-28 DIAGNOSIS — R05 Cough: Secondary | ICD-10-CM

## 2014-07-28 DIAGNOSIS — R059 Cough, unspecified: Secondary | ICD-10-CM

## 2014-07-31 ENCOUNTER — Telehealth: Payer: Self-pay

## 2014-07-31 DIAGNOSIS — I4891 Unspecified atrial fibrillation: Secondary | ICD-10-CM

## 2014-07-31 NOTE — Telephone Encounter (Signed)
The pt's husband came into the office 5/19 and completed a paper at the front desk that he would like a phone call in regards to the pt's pulse and BP.  This was given to me this afternoon by medical records.  I spoke with the pt and she states her SBP is running 85-100 and pulse is 100-120 for the past month.  She states that she had pneumonia a month ago and that she has been retaining fluid.  The pt continues to go to dialysis 3 times per week. The pt states that she does not feel bad with her BP and pulse. I advised her and her husband to hold hydralazine over the weekend and monitor her BP. They agreed with plan.

## 2014-08-01 NOTE — Telephone Encounter (Signed)
Agree with holding hydralazine

## 2014-08-03 ENCOUNTER — Ambulatory Visit (INDEPENDENT_AMBULATORY_CARE_PROVIDER_SITE_OTHER): Payer: Medicaid Other

## 2014-08-03 DIAGNOSIS — Z5181 Encounter for therapeutic drug level monitoring: Secondary | ICD-10-CM | POA: Diagnosis not present

## 2014-08-03 DIAGNOSIS — I4891 Unspecified atrial fibrillation: Secondary | ICD-10-CM

## 2014-08-03 LAB — POCT INR: INR: 3.5

## 2014-08-03 MED ORDER — HYDRALAZINE HCL 25 MG PO TABS: ORAL_TABLET | ORAL | Status: DC

## 2014-08-03 NOTE — Telephone Encounter (Signed)
The pt came into the office today for coumadin check.    BP readings: Saturday 140/47 Sunday 147/---, pulse 120 Today 133/75, pulse 49  Pt was advised to continue to hold hydralazine.

## 2014-08-03 NOTE — Telephone Encounter (Signed)
Agree; thx 

## 2014-08-14 ENCOUNTER — Ambulatory Visit (INDEPENDENT_AMBULATORY_CARE_PROVIDER_SITE_OTHER): Payer: Medicaid Other | Admitting: *Deleted

## 2014-08-14 DIAGNOSIS — I4891 Unspecified atrial fibrillation: Secondary | ICD-10-CM | POA: Diagnosis not present

## 2014-08-14 DIAGNOSIS — Z5181 Encounter for therapeutic drug level monitoring: Secondary | ICD-10-CM | POA: Diagnosis not present

## 2014-08-14 LAB — POCT INR: INR: 2.8

## 2014-08-26 ENCOUNTER — Other Ambulatory Visit: Payer: Self-pay | Admitting: Nephrology

## 2014-08-26 ENCOUNTER — Ambulatory Visit
Admission: RE | Admit: 2014-08-26 | Discharge: 2014-08-26 | Disposition: A | Discharge status: Home / Self Care | Payer: Medicaid Other | Source: Ambulatory Visit | Attending: Nephrology | Admitting: Nephrology

## 2014-08-26 DIAGNOSIS — R0602 Shortness of breath: Secondary | ICD-10-CM

## 2014-08-26 DIAGNOSIS — R059 Cough, unspecified: Secondary | ICD-10-CM

## 2014-08-26 DIAGNOSIS — R05 Cough: Secondary | ICD-10-CM

## 2014-09-11 ENCOUNTER — Ambulatory Visit (INDEPENDENT_AMBULATORY_CARE_PROVIDER_SITE_OTHER): Payer: Medicaid Other | Admitting: *Deleted

## 2014-09-11 ENCOUNTER — Other Ambulatory Visit: Payer: Self-pay | Admitting: Nephrology

## 2014-09-11 ENCOUNTER — Ambulatory Visit
Admission: RE | Admit: 2014-09-11 | Discharge: 2014-09-11 | Disposition: A | Discharge status: Home / Self Care | Payer: Medicaid Other | Source: Ambulatory Visit | Attending: Nephrology | Admitting: Nephrology

## 2014-09-11 DIAGNOSIS — R05 Cough: Secondary | ICD-10-CM

## 2014-09-11 DIAGNOSIS — Z5181 Encounter for therapeutic drug level monitoring: Secondary | ICD-10-CM

## 2014-09-11 DIAGNOSIS — R059 Cough, unspecified: Secondary | ICD-10-CM

## 2014-09-11 DIAGNOSIS — I4891 Unspecified atrial fibrillation: Secondary | ICD-10-CM | POA: Diagnosis not present

## 2014-09-11 DIAGNOSIS — R0602 Shortness of breath: Secondary | ICD-10-CM

## 2014-09-11 LAB — POCT INR: INR: 2.6

## 2014-09-24 ENCOUNTER — Other Ambulatory Visit: Payer: Self-pay | Admitting: *Deleted

## 2014-09-24 MED ORDER — PACERONE 100 MG PO TABS: 100.0000 mg | ORAL_TABLET | Freq: Two times a day (BID) | ORAL | Status: DC

## 2014-09-25 ENCOUNTER — Telehealth: Payer: Self-pay

## 2014-09-25 NOTE — Telephone Encounter (Signed)
Prior auth sent to [employer] for [employer] (brand). Confitm # [id number]. 24 hour turn around time.

## 2014-10-21 ENCOUNTER — Ambulatory Visit (INDEPENDENT_AMBULATORY_CARE_PROVIDER_SITE_OTHER): Payer: Medicaid Other | Admitting: *Deleted

## 2014-10-21 DIAGNOSIS — I4891 Unspecified atrial fibrillation: Secondary | ICD-10-CM | POA: Diagnosis not present

## 2014-10-21 DIAGNOSIS — Z5181 Encounter for therapeutic drug level monitoring: Secondary | ICD-10-CM

## 2014-10-21 LAB — POCT INR: INR: 2.6

## 2014-10-23 ENCOUNTER — Other Ambulatory Visit: Payer: Self-pay | Admitting: Endocrinology

## 2014-10-26 ENCOUNTER — Ambulatory Visit (INDEPENDENT_AMBULATORY_CARE_PROVIDER_SITE_OTHER): Payer: Medicaid Other | Admitting: Internal Medicine

## 2014-10-26 ENCOUNTER — Encounter: Payer: Self-pay | Admitting: Internal Medicine

## 2014-10-26 VITALS — BP 145/49 | HR 67 | Temp 97.9°F | Ht 63.5 in | Wt 121.3 lb

## 2014-10-26 DIAGNOSIS — I10 Essential (primary) hypertension: Secondary | ICD-10-CM

## 2014-10-26 DIAGNOSIS — K5900 Constipation, unspecified: Secondary | ICD-10-CM

## 2014-10-26 DIAGNOSIS — D638 Anemia in other chronic diseases classified elsewhere: Secondary | ICD-10-CM | POA: Insufficient documentation

## 2014-10-26 DIAGNOSIS — R140 Abdominal distension (gaseous): Secondary | ICD-10-CM

## 2014-10-26 MED ORDER — SENNOSIDES-DOCUSATE SODIUM 8.6-50 MG PO TABS: 2.0000 | ORAL_TABLET | Freq: Every evening | ORAL | Status: DC | PRN

## 2014-10-26 MED ORDER — SIMETHICONE 80 MG PO CHEW: 80.0000 mg | CHEWABLE_TABLET | Freq: Four times a day (QID) | ORAL | Status: DC | PRN

## 2014-10-26 NOTE — Assessment & Plan Note (Signed)
Asked her to continue prune juice 1 glass a day on non HD days (with caution since it may cause hyperkalemia with her ESRD).   Switched colace (stool softener) to Senokot-S (stool softener + stimulant).

## 2014-10-26 NOTE — Patient Instructions (Addendum)
Stop taking colace. Try the combo Sennokot-S for constipation.  Start taking Gas X 2-4 times a day as needed for bloating.  Take your iron pill every night.  Follow up in 1 month with Dr. Tasia Catchings.   Take all of your other medication as prescribed.

## 2014-10-26 NOTE — Assessment & Plan Note (Signed)
Has anemia of chronic disease also with low iron.  Asked to continue PO iron every day. She has noncompliance with this. Asked to take at night before bed at empty stomach.

## 2014-10-26 NOTE — Assessment & Plan Note (Signed)
Filed Vitals:   10/26/14 1459  BP: 145/49  Pulse: 67  Temp: 97.9 F (36.6 C)     Currently on Plendil 2.5mg  + hydralazine  + metoprolol .  Being managed by Dr. Excell Seltzer cardiology.  Continue same regimen for now. Patient has hx of noncompliance, will not change anything as BP is overall ok today.

## 2014-10-26 NOTE — Assessment & Plan Note (Signed)
Having bloating and feels low appetite due to this. Denies any fried food, spice food, or milk products. Could be 2/2 to PO iron but she only takes the occasionally. Denies any dysphagia or burning.  Asked to take GasX 2-4 times a day every day on a regular basis to see if it helps. She has only tried taking them once in past. I suspect she is not giving any medication enough time to see the effect. Has fear of too much medications.

## 2014-10-26 NOTE — Progress Notes (Signed)
   Subjective:    Patient ID: [name], female    DOB: Jul 11, 1935, 79 y.o.   MRN: [id number]  HPI  79 yo female with HTN, ESRD on HD,anemia of chronic disease, CHF, Hypothyroidism (s/p Radioactive iodine for Grave's), HLD, afib on coumadin here with many generalized complaint such as constipation, bloating, feeling weak with dialysis.   Has hx of noncompliance with meds.states that she doesn't like to take more than 1 medicine at the same time. She is very reluctant any advice about medications.   Only taking iron occasionally despite being told by her nephrologist and other providers to take it daily.  Prune juice helps somewhat with constipation. Takes it on non HD days and has BM on those days.   Hasn't tried anything on a regular basis for bloating. Gets easily full because of feeling bloated. No dysphagia or other complaints.  Had cscope recently per husband. I cannot find the record. He states it was normal and was less than 10 years ago.   Review of Systems  Constitutional: Positive for fatigue. Negative for fever and chills.  HENT: Negative for congestion, sore throat and trouble swallowing.   Eyes: Negative for pain and visual disturbance.  Respiratory: Negative for chest tightness, shortness of breath and wheezing.   Cardiovascular: Negative for chest pain, palpitations and leg swelling.  Gastrointestinal: Positive for constipation. Negative for nausea, vomiting, diarrhea, blood in stool, abdominal distention and rectal pain.       Bloating  Endocrine: Negative.   Genitourinary: Negative for dysuria and vaginal pain.  Musculoskeletal: Negative for back pain and neck pain.  Skin: Negative.   Neurological: Negative for dizziness.  Psychiatric/Behavioral: Negative.        Objective:   Physical Exam  Constitutional: She is oriented to person, place, and time. No distress.  Thin appearing female.   HENT:  Head: Normocephalic and atraumatic.  Mouth/Throat:  Oropharynx is clear and moist.  Eyes: Pupils are equal, round, and reactive to light.  Cardiovascular: Normal rate and regular rhythm.   Systolic murmur  Pulmonary/Chest: Effort normal and breath sounds normal. No respiratory distress. She has no wheezes. She has no rales. She exhibits no tenderness.  Abdominal: Soft. Bowel sounds are normal. She exhibits no distension. There is no tenderness.  Musculoskeletal: Normal range of motion. She exhibits no edema or tenderness.  Neurological: She is alert and oriented to person, place, and time.  Skin: She is not diaphoretic.    Filed Vitals:   10/26/14 1459  BP: 145/49  Pulse: 67  Temp: 97.9 F (36.6 C)        Assessment & Plan:  See problem based a&p.

## 2014-10-27 NOTE — Progress Notes (Signed)
Case discussed with Dr. Tasrif at the time of the visit.  We reviewed the resident's history and exam and pertinent patient test results.  I agree with the assessment, diagnosis, and plan of care documented in the resident's note.    

## 2014-11-04 ENCOUNTER — Other Ambulatory Visit: Payer: Self-pay | Admitting: Nephrology

## 2014-11-04 ENCOUNTER — Ambulatory Visit
Admission: RE | Admit: 2014-11-04 | Discharge: 2014-11-04 | Disposition: A | Discharge status: Home / Self Care | Payer: Medicaid Other | Source: Ambulatory Visit | Attending: Nephrology | Admitting: Nephrology

## 2014-11-04 DIAGNOSIS — R609 Edema, unspecified: Secondary | ICD-10-CM

## 2014-11-04 DIAGNOSIS — J90 Pleural effusion, not elsewhere classified: Secondary | ICD-10-CM

## 2014-11-24 ENCOUNTER — Other Ambulatory Visit: Payer: Self-pay

## 2014-11-24 MED ORDER — PACERONE 100 MG PO TABS: 100.0000 mg | ORAL_TABLET | Freq: Two times a day (BID) | ORAL | Status: DC

## 2014-12-02 ENCOUNTER — Ambulatory Visit (INDEPENDENT_AMBULATORY_CARE_PROVIDER_SITE_OTHER): Payer: Medicaid Other | Admitting: *Deleted

## 2014-12-02 DIAGNOSIS — Z5181 Encounter for therapeutic drug level monitoring: Secondary | ICD-10-CM

## 2014-12-02 DIAGNOSIS — I4891 Unspecified atrial fibrillation: Secondary | ICD-10-CM

## 2014-12-02 LAB — POCT INR: INR: 1.7

## 2014-12-07 ENCOUNTER — Encounter: Payer: Self-pay | Admitting: Internal Medicine

## 2014-12-07 ENCOUNTER — Ambulatory Visit (INDEPENDENT_AMBULATORY_CARE_PROVIDER_SITE_OTHER): Payer: Medicaid Other | Admitting: Internal Medicine

## 2014-12-07 DIAGNOSIS — R140 Abdominal distension (gaseous): Secondary | ICD-10-CM

## 2014-12-07 DIAGNOSIS — I10 Essential (primary) hypertension: Secondary | ICD-10-CM | POA: Diagnosis not present

## 2014-12-07 DIAGNOSIS — K5900 Constipation, unspecified: Secondary | ICD-10-CM

## 2014-12-07 NOTE — Patient Instructions (Signed)
Keep following up with your heart doctor and your endocrinologist.  Keep taking iron.  Continue taking prune juice for constipation. Take GASX when you take prune juice to help with bloating. You can take GAS X upto 4 times a day for bloating. Avoid fatty/fried food.

## 2014-12-07 NOTE — Assessment & Plan Note (Signed)
Has bloating with prune juice but prune juice is the only thing that works for her constipation. I am assuming this is from the high fiber content of prune juice. Takes prune juice every other day to avoid hyperkalemia then has daily BM's with it but gets bloated on the days she takes prune juice which resolves the next day.   Has mild discomfort and mild distension on exam.  Again, asked to take Gas X when she takes prune juice and to repeat it 2-4 times a day max for bloating. Patient has not follows my direction from last visit, she did not take a single dose of Gas X.

## 2014-12-07 NOTE — Assessment & Plan Note (Addendum)
Doing well with prune juice but it makes her bloated. Continue prune juice. Not taking senokot-s  See my plan on "bloating" section.

## 2014-12-07 NOTE — Assessment & Plan Note (Addendum)
Filed Vitals:   12/07/14 1528  BP: 135/50  Pulse: 48  Temp: 97.7 F (36.5 C)     Currently on Plendil 2.5mg  BID + metoprolol 12.5 mg bid (not to take on HD mornings)  Being managed by Dr. Excell Seltzer cardiology. Has f/up every 6 months.   Continue same regimen for now. Dr. Excell Seltzer checks labs for amio toxicity at his office per patient. Will not check here.

## 2014-12-07 NOTE — Progress Notes (Signed)
   Subjective:    Patient ID: [name], female    DOB: 1935/10/21, 79 y.o.   MRN: [id number]  HPI  79 yo female with anemia of CKD and iron def, hypothyroidism, HTN, dCHF, Afib/flutter on coumadin, ESRD on HD, HLD, anxiety, non compliance with meds here to follow up on her chronic medication problems such as HTN, HLD,   Last visit complaints were generalized including constipation, bloating, weakness with HD.  Asked to take Senokot-S (softener+stimulant) + prune juice for Constipation, and gas x for bloating.   Has endocrine follow up in 6 months with Dr. Lucianne Muss for hypothyroidism. Last TSH 06/2014 3.63.   Continues to have bloating after taking prune juice. Prune juice however helps greatly with constipation. Has BM every day. Has not start taking gas x because does not like too many meds. No other complaints currently. States compilance with her other meds.    Review of Systems  Constitutional: Negative for fever and chills.  HENT: Negative for congestion and sore throat.   Eyes: Negative for pain, discharge and visual disturbance.  Respiratory: Negative for cough, chest tightness, shortness of breath and wheezing.   Cardiovascular: Negative for chest pain, palpitations and leg swelling.  Gastrointestinal: Positive for constipation. Negative for nausea, vomiting, abdominal pain, diarrhea, blood in stool and anal bleeding.       Bloating  Endocrine: Negative for polydipsia and polyuria.  Genitourinary: Negative for dysuria and difficulty urinating.  Musculoskeletal: Negative for back pain, joint swelling, arthralgias and neck pain.  Skin: Negative.  Negative for rash.  Allergic/Immunologic: Negative.   Neurological: Negative for dizziness, weakness, numbness and headaches.  Hematological: Negative.   Psychiatric/Behavioral: Negative.           Objective:   Physical Exam  Constitutional: She is oriented to person, place, and time. She appears well-developed and  well-nourished. No distress.  HENT:  Head: Normocephalic and atraumatic.  Mouth/Throat: Oropharynx is clear and moist. No oropharyngeal exudate.  Eyes: Conjunctivae and EOM are normal. Pupils are equal, round, and reactive to light. Right eye exhibits no discharge. Left eye exhibits no discharge. No scleral icterus.  Neck: Neck supple. No thyromegaly present.  Cardiovascular: Normal rate, regular rhythm, S1 normal, S2 normal and normal heart sounds.  Exam reveals no gallop and no friction rub.   No murmur heard. Pulmonary/Chest: Effort normal and breath sounds normal. No respiratory distress. She has no wheezes. She has no rales. She exhibits no tenderness.  Abdominal: Soft. Bowel sounds are normal. She exhibits no mass. There is no rebound and no guarding.  Ab non tender, has very mild distension with mild discomfort diffusely.   Musculoskeletal: Normal range of motion. She exhibits no edema or tenderness.  Lymphadenopathy:    She has no cervical adenopathy.  Neurological: She is alert and oriented to person, place, and time. She has normal strength and normal reflexes. No cranial nerve deficit or sensory deficit.  Skin: No rash noted. She is not diaphoretic. No erythema. No pallor.  Psychiatric: She has a normal mood and affect.      Filed Vitals:   12/07/14 1528  BP: 135/50  Pulse: 48  Temp: 97.7 F (36.5 C)       Assessment & Plan:  See problem based a&p.

## 2014-12-11 NOTE — Progress Notes (Signed)
Internal Medicine Clinic Attending  Case discussed with Dr. Ahmed soon after the resident saw the patient.  We reviewed the resident's history and exam and pertinent patient test results.  I agree with the assessment, diagnosis, and plan of care documented in the resident's note. 

## 2014-12-18 ENCOUNTER — Ambulatory Visit
Admission: RE | Admit: 2014-12-18 | Discharge: 2014-12-18 | Disposition: A | Discharge status: Home / Self Care | Payer: Medicaid Other | Source: Ambulatory Visit | Attending: Nephrology | Admitting: Nephrology

## 2014-12-18 ENCOUNTER — Other Ambulatory Visit: Payer: Self-pay | Admitting: Nephrology

## 2014-12-18 DIAGNOSIS — Z09 Encounter for follow-up examination after completed treatment for conditions other than malignant neoplasm: Secondary | ICD-10-CM

## 2014-12-21 ENCOUNTER — Other Ambulatory Visit (INDEPENDENT_AMBULATORY_CARE_PROVIDER_SITE_OTHER): Payer: Medicaid Other

## 2014-12-21 DIAGNOSIS — E890 Postprocedural hypothyroidism: Secondary | ICD-10-CM

## 2014-12-21 LAB — T3, FREE: T3 FREE: 1.8 pg/mL — AB (ref 2.3–4.2)

## 2014-12-21 LAB — T4, FREE: FREE T4: 1.2 ng/dL (ref 0.60–1.60)

## 2014-12-24 ENCOUNTER — Ambulatory Visit: Payer: Medicaid Other | Admitting: Endocrinology

## 2014-12-28 ENCOUNTER — Ambulatory Visit (INDEPENDENT_AMBULATORY_CARE_PROVIDER_SITE_OTHER): Payer: Medicaid Other | Admitting: Endocrinology

## 2014-12-28 ENCOUNTER — Encounter: Payer: Self-pay | Admitting: Endocrinology

## 2014-12-28 VITALS — BP 140/62 | HR 58 | Temp 98.3°F | Resp 14 | Ht 63.5 in | Wt 122.4 lb

## 2014-12-28 DIAGNOSIS — E890 Postprocedural hypothyroidism: Secondary | ICD-10-CM

## 2014-12-28 NOTE — Progress Notes (Signed)
Patient ID: [name], female   DOB: 12-20-1935, 79 y.o.   MRN: [id number]  Reason for Appointment:  Hypothyroidism, followup visit    History of Present Illness:   The hypothyroidism was first diagnosed after her radioactive iodine treatment for her Luiz BlareGraves' disease  in 08/2010   The patient has been treated with various doses of levothyroxine. Her dosage requirement had been quite variable until 2014   She usually tends to have nonspecific fatigue regardless of her thyroid levels She does have some chronic cold intolerance also Also has had variable amount of hair loss, not complaining about it today .           The patient is taking the thyroid supplement regularly before dinner now, she does not want to take it in the morning because of going for dialysis on 3 days Not taking any calcium or iron supplements with the thyroid supplement.    Her Synthroid dose has been the same since 2/16 when it was increased to 150 g She does not feel any different recently     Wt Readings from Last 3 Encounters:  12/28/14 122 lb 6.4 oz (55.52 kg)  12/07/14 124 lb (56.246 kg)  10/26/14 121 lb 4.8 oz (55.021 kg)    Lab Results  Component Value Date   FREET4 1.20 12/21/2014   FREET4 1.24 04/13/2014   FREET4 2.20* 10/08/2013   TSH 3.63 06/17/2014   TSH 9.90* 04/13/2014   TSH 0.56 10/08/2013       Medication List       This list is accurate as of: 12/28/14  3:57 PM.  Always use your most recent med list.               albuterol 108 (90 BASE) MCG/ACT inhaler  Commonly known as:  PROVENTIL HFA;VENTOLIN HFA  Inhale 2 puffs into the lungs every 6 (six) hours as needed for wheezing or shortness of breath.     cycloSPORINE 0.05 % ophthalmic emulsion  Commonly known as:  RESTASIS  Place 1 drop into both eyes 2 (two) times daily.     docusate sodium 100 MG capsule  Commonly known as:  COLACE  Take 1 capsule (100 mg total) by mouth 2 (two) times daily.     felodipine  2.5 MG 24 hr tablet  Commonly known as:  PLENDIL  Take 1 tablet (2.5 mg total) by mouth 2 (two) times daily.     Fluticasone-Salmeterol 250-50 MCG/DOSE Aepb  Commonly known as:  ADVAIR  Inhale 1 puff into the lungs 2 (two) times daily.     hydrALAZINE 25 MG tablet  Commonly known as:  APRESOLINE  ON HOLD starting 07/31/14     iron polysaccharides 150 MG capsule  Commonly known as:  NIFEREX  Take 150 mg by mouth 2 (two) times daily.     POLY-IRON 150 PO  Take 150 mg by mouth daily.     metoprolol tartrate 25 MG tablet  Commonly known as:  LOPRESSOR  Take one-half tablet by mouth twice a day, do not take dosage on the morning of dialysis     PACERONE 100 MG tablet  Generic drug:  amiodarone  Take 1 tablet (100 mg total) by mouth 2 (two) times daily.     ranitidine 150 MG tablet  Commonly known as:  ZANTAC  Take 150 mg by mouth at bedtime.     simethicone 80 MG chewable tablet  Commonly known as:  GAS-X  Chew 1  tablet (80 mg total) by mouth 4 (four) times daily as needed for flatulence.     SYNTHROID 150 MCG tablet  Generic drug:  levothyroxine  TAKE ONE TABLET BY MOUTH ONCE DAILY BEFORE  BREAKFAST     warfarin 5 MG tablet  Commonly known as:  COUMADIN  TAKE AS DIRECTED BY ANTICOAGULATION CLINIC        Allergies:  Allergies  Allergen Reactions  . Cardizem [Diltiazem Hcl] Hives  . Iron     ?? Almost died.  . Rena-Vite [B-Plex] Other (See Comments)    Unknown reaction  . Codeine Other (See Comments)    dizziness  . Pacerone [Amiodarone] Other (See Comments)    Visual changes with generic Amiodarone, the pt can only take brand name Pacerone  . Vancomycin Other (See Comments)    dizziness  . Venofer [Iron Sucrose (Iron Oxide Saccharated)] Itching  . Latex Itching and Rash    Past Medical History  Diagnosis Date  . ESRD (end stage renal disease) (HCC)     HD for 1 year, HD on Tues, Thurs, Sat // Likely secondary to nephrosclerosis from HTN  . HTN  (hypertension)   . HLD (hyperlipidemia)   . Cholelithiases     S/P biliary colic cholecystostomy, but not cholecystectomy secondary to omental adhesions  . Anxiety   . Anemia     BL Hgb 8-9. Likely AOCD in setting of ESRD with iron 48, ferritin  746 (03/2010)  . PAF (paroxysmal atrial fibrillation) (HCC)     On chronic coumadin therapy // Followed by Dr. Excell Seltzer (cardiology)  . Non-ST elevation MI (NSTEMI) (HCC)     Noninvasive evaluation with Myoview negative for ischemia.  // 2-D echo (05/2010) LVEF 65-70%. Grade 1 diastolic dysfunction.  Peak PA pressure 52.  Marland Kitchen Hyperthyroidism DX: 07/2010    Likely Grave's Disease. // On diagnosis, TSH < 0.08, free T4 1.94, free T3 5.3 . //  Thyroid US (07/17/2010) - nonspecific diffuse heterogenous thyroid gland. //  Thyrotropin receptor antibody neg (07/17/2010)  . Atrial flutter (HCC)   . Anticoagulant long-term use     Past Surgical History  Procedure Laterality Date  . Other surgical history  02/2009    Cholecystostomy secondary to biliary colic - NO cholecystectomy secondary to omental adhesions - by Dr. Bertram Savin  . Gallbladder surgery    . Breast lumpectomy      right  . Temple artery biopsy  12/14/10    Family History  Problem Relation Age of Onset  . Stroke    . Heart attack      Social History:  reports that she has never smoked. She does not have any smokeless tobacco history on file. She reports that she does not drink alcohol or use illicit drugs.  REVIEW Of SYSTEMS:  She has end-stage kidney disease and is on dialysis History of atrial fibrillation followed by cardiologist   Examination:   BP 140/62 mmHg  Pulse 58  Temp(Src) 98.3 F (36.8 C)  Resp 14  Ht 5' 3.5" (1.613 m)  Wt 122 lb 6.4 oz (55.52 kg)  BMI 21.34 kg/m2  SpO2 93%   Looks well, no pallor or skin changes  Biceps reflexes bilaterally normal No tremor    Assessment   Hypothyroidism, post ablative with relatively high thyroxine requirement  for her  weight of only 122 pounds     Subjectively difficult to assess her symptoms as she has nonspecific fatigue and cold intolerance She is compliant with her  medication daily and prefers to take it before dinner and does not want to change to the morning Her TSH is pending, free T4 is about the same as previously Clinically looks euthyroid and her TSH is back to normal with 150 micrograms levothyroxine dose   Treatment: Will decide on doses based on TSH level  Amna Welker 12/28/2014, 3:57 PM

## 2014-12-30 ENCOUNTER — Other Ambulatory Visit (INDEPENDENT_AMBULATORY_CARE_PROVIDER_SITE_OTHER): Payer: Medicaid Other

## 2014-12-30 DIAGNOSIS — E890 Postprocedural hypothyroidism: Secondary | ICD-10-CM | POA: Diagnosis not present

## 2014-12-30 LAB — TSH: TSH: 15.08 u[IU]/mL — ABNORMAL HIGH (ref 0.35–4.50)

## 2014-12-31 NOTE — Progress Notes (Signed)
Quick Note:  Please find out if she is taking iron tablets along with her levothyroxine if not he will need to change her dose to 175 g Also have her come back in follow-up in 2 months with labs Thyroid level is very low Prefer that she take her levothyroxine first thing in the morning every day ______

## 2015-01-04 ENCOUNTER — Telehealth: Payer: Self-pay | Admitting: Endocrinology

## 2015-01-04 NOTE — Telephone Encounter (Signed)
Patient's husband called and would like to know if it ok to refill her Rx? Does the Rx need any changes?  Please advise    Thank you

## 2015-01-05 ENCOUNTER — Other Ambulatory Visit: Payer: Self-pay | Admitting: *Deleted

## 2015-01-05 MED ORDER — SYNTHROID 150 MCG PO TABS: ORAL_TABLET | ORAL | Status: DC

## 2015-01-13 ENCOUNTER — Ambulatory Visit (INDEPENDENT_AMBULATORY_CARE_PROVIDER_SITE_OTHER): Payer: Medicaid Other | Admitting: *Deleted

## 2015-01-13 DIAGNOSIS — Z5181 Encounter for therapeutic drug level monitoring: Secondary | ICD-10-CM

## 2015-01-13 DIAGNOSIS — I4891 Unspecified atrial fibrillation: Secondary | ICD-10-CM | POA: Diagnosis not present

## 2015-01-13 LAB — POCT INR: INR: 2

## 2015-01-14 ENCOUNTER — Other Ambulatory Visit: Payer: Self-pay | Admitting: Cardiovascular Disease

## 2015-02-24 ENCOUNTER — Ambulatory Visit (INDEPENDENT_AMBULATORY_CARE_PROVIDER_SITE_OTHER): Payer: Medicaid Other | Admitting: *Deleted

## 2015-02-24 DIAGNOSIS — I4891 Unspecified atrial fibrillation: Secondary | ICD-10-CM | POA: Diagnosis not present

## 2015-02-24 DIAGNOSIS — Z5181 Encounter for therapeutic drug level monitoring: Secondary | ICD-10-CM | POA: Diagnosis not present

## 2015-02-24 LAB — POCT INR: INR: 2.4

## 2015-03-24 ENCOUNTER — Ambulatory Visit (INDEPENDENT_AMBULATORY_CARE_PROVIDER_SITE_OTHER): Payer: Medicaid Other | Admitting: *Deleted

## 2015-03-24 DIAGNOSIS — I4891 Unspecified atrial fibrillation: Secondary | ICD-10-CM | POA: Diagnosis not present

## 2015-03-24 DIAGNOSIS — Z5181 Encounter for therapeutic drug level monitoring: Secondary | ICD-10-CM

## 2015-03-24 LAB — POCT INR: INR: 1.8

## 2015-03-29 ENCOUNTER — Other Ambulatory Visit: Payer: Self-pay | Admitting: Cardiovascular Disease

## 2015-04-26 ENCOUNTER — Ambulatory Visit (INDEPENDENT_AMBULATORY_CARE_PROVIDER_SITE_OTHER): Payer: Medicaid Other | Admitting: *Deleted

## 2015-04-26 DIAGNOSIS — I4891 Unspecified atrial fibrillation: Secondary | ICD-10-CM | POA: Diagnosis not present

## 2015-04-26 DIAGNOSIS — Z5181 Encounter for therapeutic drug level monitoring: Secondary | ICD-10-CM | POA: Diagnosis not present

## 2015-04-26 LAB — POCT INR: INR: 2.5

## 2015-05-03 ENCOUNTER — Telehealth: Payer: Self-pay | Admitting: Cardiovascular Disease

## 2015-05-03 NOTE — Telephone Encounter (Signed)
I spoke with the pt's husband and offered an afternoon appointment with Dr Excell Seltzer on 05/05/15.  The pt has an eye appointment on 05/05/15 at 2:30 so she cannot make an appointment.  He requested a morning appointment if possible.  I will contact the pt's spouse if Dr Excell Seltzer has any cancellations in his schedule.

## 2015-05-03 NOTE — Telephone Encounter (Signed)
Returned call and spouse answered the phone. Unable to talk to patient because he left their home with the phone. He st the patient's HR is "all over the place." He cannot give any readings, but he says when HR is high, the patient gets nauseous and SOB. He cannot give any other information. He st he checks her BP daily, but cannot give any readings since he is not at home. He st she "is not bad enough to go to the hospital." He requests to come see Dr. Excell Seltzer today for evaluation, but that he would not be home until around 1400. Offered OV with PA, but he declined. He requests a call back after 1400 from Dr. Earmon Phoenix nurse with a plan.

## 2015-05-03 NOTE — Telephone Encounter (Signed)
Patient is experiencing heart rate issues that keep increasing and are not leveling out to normal status.  Patient's husband would like to speak with nurse.

## 2015-05-05 NOTE — Telephone Encounter (Signed)
I did not have any morning cancellations on Dr Earmon Phoenix schedule today. I have arranged a follow-up appointment on Dr Earmon Phoenix next clinic day 05/12/15.

## 2015-05-12 ENCOUNTER — Encounter: Payer: Self-pay | Admitting: Cardiovascular Disease

## 2015-05-12 ENCOUNTER — Other Ambulatory Visit: Payer: Self-pay | Admitting: Pharmacist

## 2015-05-12 ENCOUNTER — Ambulatory Visit (INDEPENDENT_AMBULATORY_CARE_PROVIDER_SITE_OTHER): Payer: Medicaid Other | Admitting: Cardiovascular Disease

## 2015-05-12 VITALS — BP 160/66 | HR 64 | Ht 63.0 in | Wt 121.4 lb

## 2015-05-12 DIAGNOSIS — I4891 Unspecified atrial fibrillation: Secondary | ICD-10-CM | POA: Diagnosis not present

## 2015-05-12 MED ORDER — WARFARIN SODIUM 5 MG PO TABS: ORAL_TABLET | ORAL | Status: DC

## 2015-05-12 MED ORDER — METOPROLOL TARTRATE 25 MG PO TABS: ORAL_TABLET | ORAL | Status: DC

## 2015-05-12 MED ORDER — FELODIPINE ER 2.5 MG PO TB24: 2.5000 mg | ORAL_TABLET | Freq: Two times a day (BID) | ORAL | Status: DC

## 2015-05-12 MED ORDER — PACERONE 100 MG PO TABS: 100.0000 mg | ORAL_TABLET | Freq: Two times a day (BID) | ORAL | Status: DC

## 2015-05-12 NOTE — Progress Notes (Signed)
Cardiology Office Note Date:  05/12/2015   ID:  [name], DOB 01-03-1936, MRN [id number]  PCP:  Hyacinth Meeker, MD  Cardiologist:  Tonny Bollman, MD    Chief Complaint  Patient presents with  . Cough     History of Present Illness: [name] is a 80 y.o. female who presents for follow-up evaluation. She has end-stage renal disease. Her cardiac issues include malignant hypertension and paroxysmal atrial fibrillation. She's been intolerant to many blood pressure medications.  The patient has been doing okay. There are no major changes in her symptoms. Her blood pressure control mainstem is difficult with dialysis. She continues to have episodes of heart palpitations. She describes chest pain when her blood pressure drops. No frank syncope.  Past Medical History  Diagnosis Date  . ESRD (end stage renal disease) (HCC)     HD for 1 year, HD on Tues, Thurs, Sat // Likely secondary to nephrosclerosis from HTN  . HTN (hypertension)   . HLD (hyperlipidemia)   . Cholelithiases     S/P biliary colic cholecystostomy, but not cholecystectomy secondary to omental adhesions  . Anxiety   . Anemia     BL Hgb 8-9. Likely AOCD in setting of ESRD with iron 48, ferritin  746 (03/2010)  . PAF (paroxysmal atrial fibrillation) (HCC)     On chronic coumadin therapy // Followed by Dr. Excell Seltzer (cardiology)  . Non-ST elevation MI (NSTEMI) (HCC)     Noninvasive evaluation with Myoview negative for ischemia.  // 2-D echo (05/2010) LVEF 65-70%. Grade 1 diastolic dysfunction.  Peak PA pressure 52.  Marland Kitchen Hyperthyroidism DX: 07/2010    Likely Grave's Disease. // On diagnosis, TSH < 0.08, free T4 1.94, free T3 5.3 . //  Thyroid US (07/17/2010) - nonspecific diffuse heterogenous thyroid gland. //  Thyrotropin receptor antibody neg (07/17/2010)  . Atrial flutter (HCC)   . Anticoagulant long-term use     Past Surgical History  Procedure Laterality Date  . Other surgical history  02/2009   Cholecystostomy secondary to biliary colic - NO cholecystectomy secondary to omental adhesions - by Dr. Bertram Savin  . Gallbladder surgery    . Breast lumpectomy      right  . Temple artery biopsy  12/14/10    Current Outpatient Prescriptions  Medication Sig Dispense Refill  . calcium acetate (PHOSLO) 667 MG capsule Take 667 mg by mouth 2 (two) times daily.    . cycloSPORINE (RESTASIS) 0.05 % ophthalmic emulsion Place 1 drop into both eyes 2 (two) times daily. 0.4 mL 0  . felodipine (PLENDIL) 2.5 MG 24 hr tablet Take 1 tablet (2.5 mg total) by mouth 2 (two) times daily. 180 tablet 3  . metoprolol tartrate (LOPRESSOR) 25 MG tablet Take one-half tablet by mouth twice a day, do not take dosage on the morning of dialysis 30 tablet 6  . PACERONE 100 MG tablet Take 1 tablet (100 mg total) by mouth 2 (two) times daily. 60 tablet 5  . Polysaccharide Iron Complex (POLY-IRON 150 PO) Take 150 mg by mouth daily.    Marland Kitchen SYNTHROID 150 MCG tablet TAKE ONE TABLET BY MOUTH ONCE DAILY BEFORE  BREAKFAST ON AN EMPTY STOMACH 30 tablet 5  . warfarin (COUMADIN) 5 MG tablet Take as directed by the Coumadin Clinic. 80 tablet 0   No current facility-administered medications for this visit.    Allergies:   Cardizem; Iron; Rena-vite; Codeine; Pacerone; Vancomycin; Venofer; and Latex   Social History:  The patient  reports that  she has never smoked. She does not have any smokeless tobacco history on file. She reports that she does not drink alcohol or use illicit drugs.   Family History:  The patient's family history is not on file.    ROS:  Please see the history of present illness.  Otherwise, review of systems is positive for fatigue, weakness.  All other systems are reviewed and negative.    PHYSICAL EXAM: VS:  BP 160/66 mmHg  Pulse 64  Ht  (1.6 m)  Wt 55.067 kg (121 lb 6.4 oz)  BMI 21.51 kg/m2 , BMI Body mass index is 21.51 kg/(m^2). GEN: Well nourished, well developed, in no acute distress HEENT:  normal Neck: no JVD, no masses. No carotid bruits Cardiac: RRR without murmur or gallop                Respiratory:  clear to auscultation bilaterally, normal work of breathing GI: soft, nontender, nondistended, + BS MS: no deformity or atrophy Ext: no pretibial edema, pedal pulses 2+= bilaterally Skin: warm and dry, no rash Neuro:  Strength and sensation are intact Psych: euthymic mood, full affect  EKG:  EKG is ordered today. The ekg ordered today shows NSR 64 bpm, RAE, RV conduction delay  Recent Labs: 12/30/2014: TSH 15.08*   Lipid Panel     Component Value Date/Time   CHOL 199 06/18/2013 0805   TRIG 147.0 06/18/2013 0805   HDL 63.90 06/18/2013 0805   CHOLHDL 3 06/18/2013 0805   VLDL 29.4 06/18/2013 0805   LDLCALC 106* 06/18/2013 0805   LDLDIRECT 155.1 01/09/2011 0945      Wt Readings from Last 3 Encounters:  05/12/15 55.067 kg (121 lb 6.4 oz)  12/28/14 55.52 kg (122 lb 6.4 oz)  12/07/14 56.246 kg (124 lb)     Cardiac Studies Reviewed: Cardiac Cath 2012: HEMODYNAMIC DATA: Aortic pressure is 166/48 with a mean of 88 mmHg. Left ventricular pressures 163 with EDP of 14 mmHg.  ANGIOGRAPHIC DATA: The left main coronary artery is normal.  Left anterior descending artery has scattered 20% irregularities.  Left circumflex coronary has 20% irregularities in the proximal vessel.  The right coronary artery arises and distributes normally. It has mild irregularities up to 10-20%.  Left ventricular angiography performed in the RAO view demonstrates normal left ventricular size and contractility with normal systolic function. Ejection fraction is estimated at 60%.  FINAL INTERPRETATION: 1. Minimal nonobstructive atherosclerotic coronary artery disease. 2. Normal left ventricular function.  PLAN: We would recommend continued medical therapy.  ASSESSMENT AND PLAN: 1.  Paroxysmal atrial fibrillation. The patient has done remarkably well on low-dose  amiodarone. She's had a huge number of drug intolerances but has been able to tolerate amiodarone on a long-term basis. She also tolerates warfarin without bleeding complications. I will see her back in 6 months.  2. Chest pain: Suspect related to hypotension. She had normal coronaries at cardiac catheterization a few years back. Supportive care is indicated.  3. Malignant hypertension: Blood pressure control is always difficult. No changes are made today. She remains on felodipine, hydralazine, and metoprolol.  Current medicines are reviewed with the patient today.  The patient does not have concerns regarding medicines.  Labs/ tests ordered today include:  No orders of the defined types were placed in this encounter.    Disposition:   FU 6 months  Signed, Tonny Bollman, MD  05/12/2015 3:16 PM    [facility] [address] [place], [place], [place]  [address] Phone: [phone]; Fax: [phone]

## 2015-05-12 NOTE — Patient Instructions (Signed)

## 2015-05-26 ENCOUNTER — Other Ambulatory Visit: Payer: Self-pay

## 2015-05-26 ENCOUNTER — Ambulatory Visit (INDEPENDENT_AMBULATORY_CARE_PROVIDER_SITE_OTHER): Payer: Medicaid Other | Admitting: *Deleted

## 2015-05-26 ENCOUNTER — Ambulatory Visit (HOSPITAL_COMMUNITY): Payer: Medicaid Other | Attending: Cardiology

## 2015-05-26 DIAGNOSIS — I4891 Unspecified atrial fibrillation: Secondary | ICD-10-CM | POA: Diagnosis present

## 2015-05-26 DIAGNOSIS — I272 Other secondary pulmonary hypertension: Secondary | ICD-10-CM | POA: Diagnosis not present

## 2015-05-26 DIAGNOSIS — N186 End stage renal disease: Secondary | ICD-10-CM | POA: Diagnosis not present

## 2015-05-26 DIAGNOSIS — I5189 Other ill-defined heart diseases: Secondary | ICD-10-CM | POA: Insufficient documentation

## 2015-05-26 DIAGNOSIS — Z5181 Encounter for therapeutic drug level monitoring: Secondary | ICD-10-CM | POA: Diagnosis not present

## 2015-05-26 DIAGNOSIS — I340 Nonrheumatic mitral (valve) insufficiency: Secondary | ICD-10-CM | POA: Diagnosis not present

## 2015-05-26 DIAGNOSIS — E785 Hyperlipidemia, unspecified: Secondary | ICD-10-CM | POA: Diagnosis not present

## 2015-05-26 DIAGNOSIS — I1311 Hypertensive heart and chronic kidney disease without heart failure, with stage 5 chronic kidney disease, or end stage renal disease: Secondary | ICD-10-CM | POA: Insufficient documentation

## 2015-05-26 DIAGNOSIS — I352 Nonrheumatic aortic (valve) stenosis with insufficiency: Secondary | ICD-10-CM | POA: Insufficient documentation

## 2015-05-26 DIAGNOSIS — I071 Rheumatic tricuspid insufficiency: Secondary | ICD-10-CM | POA: Diagnosis not present

## 2015-05-26 LAB — POCT INR: INR: 1.8

## 2015-06-09 ENCOUNTER — Ambulatory Visit (INDEPENDENT_AMBULATORY_CARE_PROVIDER_SITE_OTHER): Payer: Medicaid Other | Admitting: *Deleted

## 2015-06-09 DIAGNOSIS — Z5181 Encounter for therapeutic drug level monitoring: Secondary | ICD-10-CM | POA: Diagnosis not present

## 2015-06-09 DIAGNOSIS — I4891 Unspecified atrial fibrillation: Secondary | ICD-10-CM

## 2015-06-09 LAB — POCT INR: INR: 2.5

## 2015-06-14 ENCOUNTER — Ambulatory Visit (INDEPENDENT_AMBULATORY_CARE_PROVIDER_SITE_OTHER): Payer: Medicaid Other | Admitting: Internal Medicine

## 2015-06-14 ENCOUNTER — Encounter: Payer: Self-pay | Admitting: Internal Medicine

## 2015-06-14 VITALS — BP 152/48 | HR 58 | Temp 98.0°F | Wt 124.4 lb

## 2015-06-14 DIAGNOSIS — R1011 Right upper quadrant pain: Secondary | ICD-10-CM

## 2015-06-14 DIAGNOSIS — I10 Essential (primary) hypertension: Secondary | ICD-10-CM

## 2015-06-14 NOTE — Progress Notes (Signed)
   Subjective:    Patient ID: [name], female    DOB: 1935-10-24, 80 y.o.   MRN: [id number]  HPI  80 yo female with P Afib/flutter, hypothyroidism, chronic D CHF, HTN, constipation, ESRD on HD, here for f/up of HTN. Also complaining of RUQ abd pain.    HTN - on felodipine 2.5mg  once on HD nights and BID on non HD days,  metoprolol 37.5mg  BID taking on PRN basis. She has been tried on many meds in the past and this was chosen be her regimen given reactions to diff medications in the past and also controlling her BP too strictly causes low BP on HD days.   RUQ abd pain - has been going on for "long time". Attempted to do cholecystectomy on 2010 for cholecystitis however 2/2 to omental adhesions, they could not do cholecystectomy. Several gallstones were drained and then a tube was placed in the Fundus. She feels her pain is worse after eating and also when she feels bloated. She gets bloated easily from prune juice which she takes for constipation.  No fever or chills or other symptoms.     Review of Systems  Constitutional: Negative for fever and chills.  HENT: Negative for congestion and sore throat.   Respiratory: Negative for chest tightness and shortness of breath.   Cardiovascular: Negative for chest pain and leg swelling.  Gastrointestinal: Positive for abdominal pain and constipation. Negative for nausea, vomiting, diarrhea and abdominal distention.  Endocrine: Negative.   Neurological: Negative for dizziness and light-headedness.  Psychiatric/Behavioral: Negative.        Objective:   Physical Exam  Constitutional: She is oriented to person, place, and time. She appears well-developed and well-nourished. No distress.  HENT:  Head: Normocephalic and atraumatic.  Mouth/Throat: Oropharynx is clear and moist. No oropharyngeal exudate.  Eyes: Conjunctivae and EOM are normal. Pupils are equal, round, and reactive to light. Right eye exhibits no discharge. Left eye exhibits no  discharge. No scleral icterus.  Neck: Neck supple. No thyromegaly present.  Cardiovascular: Normal rate, regular rhythm, S1 normal, S2 normal and normal heart sounds.  Exam reveals no gallop and no friction rub.   No murmur heard. Pulmonary/Chest: Effort normal and breath sounds normal. No respiratory distress. She has no wheezes. She has no rales. She exhibits no tenderness.  Abdominal: Soft. Bowel sounds are normal. She exhibits no mass. There is no rebound and no guarding.  No tenderness on RUQ on exam currently. Negative murphy's sign.   Musculoskeletal: Normal range of motion. She exhibits no edema or tenderness.  Lymphadenopathy:    She has no cervical adenopathy.  Neurological: She is alert and oriented to person, place, and time. She has normal strength and normal reflexes. No cranial nerve deficit or sensory deficit.  Skin: No rash noted. She is not diaphoretic. No erythema. No pallor.  Psychiatric: She has a normal mood and affect.    Filed Vitals:   06/14/15 1530  BP: 152/48  Pulse: 58  Temp: 98 F (36.7 C)         Assessment & Plan:  See problem based a&p.

## 2015-06-14 NOTE — Assessment & Plan Note (Signed)
Has colicky RUQ abd pain worse after eating. This could be from gall bladder in etiology. She had attempted cholecystectomy in the past but was not successful due to many omental adhesions to the gall bladder. Stones were drained and had a drain placed. I wonder if she could have bile duct stone formation or what exactly the anatomy is currently of her gall bladder - will get RUQ u/s for now. If negative, may look into other etiology of RUQ pain such as ovarian malignancy? may consider doing transvaginal ultrasound as the next step if RUQ u/s is negative. Also may consider EGD further down (?gastritits/ulcers?)

## 2015-06-14 NOTE — Assessment & Plan Note (Signed)
HTN - on felodipine 2.5mg  once on HD nights and BID on non HD days,  metoprolol 37.5mg  BID taking on PRN basis. She has been tried on many meds in the past and this was chosen be her regimen given reactions to diff medications in the past and also controlling her BP too strictly causes low BP on HD days.   Will continue same regimen for now. I would rather accept a higher BP reading on non HD days then cause hypotension on HD days by being too strict with her meds.

## 2015-06-14 NOTE — Patient Instructions (Addendum)
Please get the ultrasound done.  Follow up in 1 month for your pain.

## 2015-06-15 NOTE — Progress Notes (Signed)
Internal Medicine Clinic Attending  Case discussed with Dr. Ahmed at the time of the visit.  We reviewed the resident's history and exam and pertinent patient test results.  I agree with the assessment, diagnosis, and plan of care documented in the resident's note. 

## 2015-06-23 ENCOUNTER — Other Ambulatory Visit (INDEPENDENT_AMBULATORY_CARE_PROVIDER_SITE_OTHER): Payer: Medicaid Other

## 2015-06-23 DIAGNOSIS — E890 Postprocedural hypothyroidism: Secondary | ICD-10-CM

## 2015-06-23 LAB — TSH: TSH: 5.16 u[IU]/mL — AB (ref 0.35–4.50)

## 2015-06-28 ENCOUNTER — Ambulatory Visit (INDEPENDENT_AMBULATORY_CARE_PROVIDER_SITE_OTHER): Payer: Medicaid Other | Admitting: Endocrinology

## 2015-06-28 ENCOUNTER — Encounter: Payer: Self-pay | Admitting: Endocrinology

## 2015-06-28 VITALS — BP 144/70 | HR 103 | Temp 98.4°F | Resp 14 | Ht 63.0 in | Wt 123.0 lb

## 2015-06-28 DIAGNOSIS — E890 Postprocedural hypothyroidism: Secondary | ICD-10-CM | POA: Diagnosis not present

## 2015-06-28 NOTE — Progress Notes (Signed)
Patient ID: [name], female   DOB: 12-13-1935, 80 y.o.   MRN: [id number]   Reason for Appointment:  Hypothyroidism, followup visit    History of Present Illness:   The hypothyroidism was first diagnosed after her radioactive iodine treatment for her [name]  in 08/2010   The patient has been treated with various doses of levothyroxine over the years, generally requiring higher than expected dose for her weight   She usually tends to have nonspecific fatigue regardless of her thyroid levels She does have some chronic cold intolerance also Also has had variable amount of hair loss .           The patient is taking the thyroid supplement regularly before breakfast when she does not go for diagnosis and before lunch when she goes for dialysis since she does not eat in the morning She takes the medication 5 minutes before eating usually She was apparently taking her iron and thyroid pills together at dinnertime previously Not taking any calcium or iron supplements now with the levothyroxine    Her Synthroid dose has been the same since 2/16 when it was increased to 150 g She does not feel any different with her energy level, weight is stable     Wt Readings from Last 3 Encounters:  06/28/15 123 lb (55.792 kg)  06/14/15 124 lb 6.4 oz (56.427 kg)  05/12/15 121 lb 6.4 oz (55.067 kg)    TSH levels as follows:  Lab Results  Component Value Date   TSH 5.16* 06/23/2015   TSH 15.08* 12/30/2014   TSH 3.63 06/17/2014   FREET4 1.20 12/21/2014   FREET4 1.24 04/13/2014   FREET4 2.20* 10/08/2013        Medication List       This list is accurate as of: 06/28/15  4:16 PM.  Always use your most recent med list.               cycloSPORINE 0.05 % ophthalmic emulsion  Commonly known as:  RESTASIS  Place 1 drop into both eyes 2 (two) times daily.     felodipine 2.5 MG 24 hr tablet  Commonly known as:  PLENDIL  Take 1 tablet (2.5 mg total) by mouth 2 (two)  times daily.     metoprolol tartrate 25 MG tablet  Commonly known as:  LOPRESSOR  Take one-half tablet by mouth twice a day, do not take dosage on the morning of dialysis     PACERONE 100 MG tablet  Generic drug:  amiodarone  Take 1 tablet (100 mg total) by mouth 2 (two) times daily.     PHOSLO 667 MG capsule  Generic drug:  calcium acetate  Take 667 mg by mouth 2 (two) times daily.     POLY-IRON 150 PO  Take 150 mg by mouth daily.     SYNTHROID 150 MCG tablet  Generic drug:  levothyroxine  TAKE ONE TABLET BY MOUTH ONCE DAILY BEFORE  BREAKFAST ON AN EMPTY STOMACH     warfarin 5 MG tablet  Commonly known as:  COUMADIN  Take as directed by the Coumadin Clinic.        Allergies:  Allergies  Allergen Reactions  . Cardizem [Diltiazem Hcl] Hives  . Iron     ?? Almost died.  . Rena-Vite [B-Plex] Other (See Comments)    Unknown reaction  . Codeine Other (See Comments)    dizziness  . Pacerone [Amiodarone] Other (See Comments)    Visual changes  with generic Amiodarone, the pt can only take brand name Pacerone  . Vancomycin Other (See Comments)    dizziness  . Venofer [Iron Sucrose (Iron Oxide Saccharated)] Itching  . Latex Itching and Rash    Past Medical History  Diagnosis Date  . ESRD (end stage renal Hines) (HCC)     HD for 1 year, HD on Tues, Thurs, Sat // Likely secondary to nephrosclerosis from HTN  . HTN (hypertension)   . HLD (hyperlipidemia)   . Cholelithiases     S/P biliary colic cholecystostomy, but not cholecystectomy secondary to omental adhesions  . Anxiety   . Anemia     BL Hgb 8-9. Likely AOCD in setting of ESRD with iron 48, ferritin  746 (03/2010)  . PAF (paroxysmal atrial fibrillation) (HCC)     On chronic coumadin therapy // Followed by Dr. Excell Seltzer (cardiology)  . Non-ST elevation MI (NSTEMI) (HCC)     Noninvasive evaluation with Myoview negative for ischemia.  // 2-D echo (05/2010) LVEF 65-70%. Grade 1 diastolic dysfunction.  Peak PA pressure  52.  Marland Kitchen Hyperthyroidism DX: 07/2010    Likely Grave's Hines. // On diagnosis, TSH < 0.08, free T4 1.94, free T3 5.3 . //  Thyroid US (07/17/2010) - nonspecific diffuse heterogenous thyroid gland. //  Thyrotropin receptor antibody neg (07/17/2010)  . Atrial flutter (HCC)   . Anticoagulant long-term use     Past Surgical History  Procedure Laterality Date  . Other surgical history  02/2009    Cholecystostomy secondary to biliary colic - NO cholecystectomy secondary to omental adhesions - by Dr. Bertram Savin  . Gallbladder surgery    . Breast lumpectomy      right  . Temple artery biopsy  12/14/10    Family History  Problem Relation Age of Onset  . Stroke    . Heart attack      Social History:  reports that she has never smoked. She does not have any smokeless tobacco history on file. She reports that she does not drink alcohol or use illicit drugs.  REVIEW Of SYSTEMS:  She has end-stage kidney Hines and is on dialysis History of atrial fibrillation followed by cardiologist, She thinks her pulse rate is variable   Examination:   BP 144/70 mmHg  Pulse 103  Temp(Src) 98.4 F (36.9 C)  Resp 14  Ht  (1.6 m)  Wt 123 lb (55.792 kg)  BMI 21.79 kg/m2  SpO2 96%   Looks well, no pallor or skin changes  Thyroid not palpable Biceps reflex on right normal    Assessment   Hypothyroidism, post ablative with relatively high thyroxine requirement  for her weight of only 122 pounds     Subjectively difficult to assess her symptoms as she has persistent nonspecific fatigue and cold intolerance She is compliant with her medication daily although taking it just before eating usually Although her TSH is near normal and improved with separating her thyroid and iron tablets difficult to know she is symptomatic from her TSH level of 5.1 Currently taking 150 g    Treatment:  Since she has history of atrial fibrillation and fast heart rate as well as considering her age the TSH  level of 5.1 is adequate; would not want to increase it to 175 at this time However advised her to take her medication at least 15-30 minutes before eating  Follow-up in 4 months  [name] 06/28/2015, 4:16 PM

## 2015-06-28 NOTE — Patient Instructions (Signed)
Take thyroid pill 30 min before eting

## 2015-07-02 ENCOUNTER — Ambulatory Visit (HOSPITAL_COMMUNITY)
Admission: RE | Admit: 2015-07-02 | Discharge: 2015-07-02 | Disposition: A | Discharge status: Home / Self Care | Payer: Medicaid Other | Source: Ambulatory Visit | Attending: Student in an Organized Health Care Education/Training Program | Admitting: Student in an Organized Health Care Education/Training Program

## 2015-07-02 DIAGNOSIS — R1011 Right upper quadrant pain: Secondary | ICD-10-CM | POA: Diagnosis not present

## 2015-07-06 ENCOUNTER — Telehealth: Payer: Self-pay | Admitting: Cardiovascular Disease

## 2015-07-06 NOTE — Telephone Encounter (Signed)
I spoke with the pt's husband and he said the pt is complaining of irregular heart beat and that her heart rate is up and down.  The nurse at dialysis told the pt to contact Cardiology to address having her Pacerone adjusted. I made Mr [name] aware that Atrial fibrillation is an irregular heart beat. I reviewed the pt's flow sheet in her chart dating back to 2015 and the pt's pulse runs in the 50's and 60's with the exception of 103 on 06/28/15. I made him aware that the pt's medications have been adjusted in the past but she has multiple intolerances and comorbidities.  The pt also has not been taking her thyroid supplement correctly and I educated Mr [name] on how this medication should be taken and that her thyroid can effect her heart rate.  He requested that I forward this information to Dr Excell Seltzerooper to review and advise if needed.

## 2015-07-06 NOTE — Telephone Encounter (Signed)
Left message on machine for Mr [name] to contact the office.

## 2015-07-06 NOTE — Telephone Encounter (Signed)
I spoke with the pt and she complains of low BP and fast heart rate on dialysis days. I made her aware that this occurs with dialysis due to the fluid shifts.  I advised her that her medications cannot be adjusted because this would cause her heart rate to drop to low.  The pt's dialysis doctor is Dr Eliott Nineunham.  Dr Excell Seltzerooper would like the pt to continue her current medications and no medication adjustments were advised.  I asked the pt to speak with Dr Eliott Nineunham about her symptoms to see if something needs to be changed about her dialysis treatment.

## 2015-07-06 NOTE — Telephone Encounter (Signed)
New Message  Pt husband called states that her heartbeat is irregular. Request a call back to discuss changing the dose of medications. States that her heart beat is always up or down. No readings. Please call back to discuss.

## 2015-07-06 NOTE — Telephone Encounter (Signed)
Follow Up:; ° ° °Returning your call. °

## 2015-07-06 NOTE — Telephone Encounter (Signed)
Agree with plan. The patient's baseline heart rate is too slow to uptitrate her beta blocker. In addition she has many medication intolerances and has never been able to titrate higher doses than she is currently taking. Amiodarone is the only drug that she has tolerated well over time and would continue at the current dose.

## 2015-07-19 ENCOUNTER — Telehealth: Payer: Self-pay | Admitting: Endocrinology

## 2015-07-19 NOTE — Telephone Encounter (Signed)
Please call pt back please he has had recent head shaking her MD told her it could be thyroid

## 2015-07-20 ENCOUNTER — Other Ambulatory Visit: Payer: Self-pay | Admitting: *Deleted

## 2015-07-20 ENCOUNTER — Other Ambulatory Visit (INDEPENDENT_AMBULATORY_CARE_PROVIDER_SITE_OTHER): Payer: Medicaid Other

## 2015-07-20 DIAGNOSIS — E890 Postprocedural hypothyroidism: Secondary | ICD-10-CM

## 2015-07-20 LAB — TSH: TSH: 2.13 u[IU]/mL (ref 0.35–4.50)

## 2015-07-20 NOTE — Telephone Encounter (Signed)
Very unlikely that it is her thyroid.  She can come in to have a TSH checked if she wants

## 2015-07-20 NOTE — Telephone Encounter (Signed)
Noted, TSH ordered, they are going to go to [facility] to have that done.

## 2015-07-20 NOTE — Telephone Encounter (Signed)
Patient said her head has been shaking and then stop, it keeps doing this.  It started last Wednesday or Thursday.  Her PCP said it could be her thyroid.  Please advise.

## 2015-07-20 NOTE — Telephone Encounter (Signed)
PT called again requesting call back

## 2015-07-21 ENCOUNTER — Telehealth: Payer: Self-pay | Admitting: Endocrinology

## 2015-07-21 NOTE — Telephone Encounter (Signed)
Normal

## 2015-07-21 NOTE — Telephone Encounter (Signed)
Patients husband called, he wants to know if you have gotten her lab results back that were done yesterday at [facility]?

## 2015-07-21 NOTE — Telephone Encounter (Signed)
Patient husband has question about lab work. He asked you call him.

## 2015-07-22 NOTE — Telephone Encounter (Signed)
Results given to patient's husband.

## 2015-07-26 ENCOUNTER — Ambulatory Visit (INDEPENDENT_AMBULATORY_CARE_PROVIDER_SITE_OTHER): Payer: Medicaid Other | Admitting: Internal Medicine

## 2015-07-26 ENCOUNTER — Encounter: Payer: Self-pay | Admitting: Internal Medicine

## 2015-07-26 VITALS — BP 144/66 | HR 94 | Temp 97.7°F | Wt 124.8 lb

## 2015-07-26 DIAGNOSIS — R140 Abdominal distension (gaseous): Secondary | ICD-10-CM | POA: Diagnosis not present

## 2015-07-26 NOTE — Patient Instructions (Signed)
Please take Gas X as needed upto 4 times a day for bloating.  Follow up as needed in 6 months.

## 2015-07-26 NOTE — Progress Notes (Signed)
   Subjective:    Patient ID: [name], female    DOB: 05-18-1935, 80 y.o.   MRN: [id number]  HPI  80 yo female with PAfib/flutter on coumadin, hypothyroidism, chronic D CHF, HTN, ESRD on HD, here for f/up of HTN.  HTN - on felodipine 2.5mg  once on HD nights and BID on non HD days, metoprolol 37.5mg  BID taking on PRN basis. She has been tried on many meds in the past and this was chosen be her regimen given reactions to diff medications in the past and also controlling her BP too strictly causes low BP on HD days.   Continues to have bloating but has not been taking her gas X as we told her before. No longer having her RUQ abd pain. U/s showed no gallbladder.    Review of Systems  Constitutional: Negative for fever, chills and fatigue.  Respiratory: Negative for chest tightness and shortness of breath.   Cardiovascular: Negative for chest pain, palpitations and leg swelling.  Gastrointestinal: Negative for abdominal pain and abdominal distention.       Bloating  Neurological: Negative for weakness and numbness.       Objective:   Physical Exam  Constitutional: She is oriented to person, place, and time. She appears well-developed and well-nourished. No distress.  HENT:  Head: Normocephalic and atraumatic.  Eyes: EOM are normal. Pupils are equal, round, and reactive to light.  Neck: Normal range of motion.  Pulmonary/Chest: Effort normal and breath sounds normal. No respiratory distress. She has no wheezes.  Abdominal: Soft. Bowel sounds are normal. She exhibits no distension. There is no tenderness.  Musculoskeletal: Normal range of motion. She exhibits no edema or tenderness.  Neurological: She is alert and oriented to person, place, and time. No cranial nerve deficit.  Skin: She is not diaphoretic.    Filed Vitals:   07/26/15 1541  BP: 144/66  Pulse: 94  Temp: 97.7 F (36.5 C)         Assessment & Plan:  See problem based a&p

## 2015-07-27 ENCOUNTER — Other Ambulatory Visit: Payer: Self-pay | Admitting: Cardiovascular Disease

## 2015-07-27 NOTE — Progress Notes (Signed)
Case discussed with Dr. Ahmed at the time of the visit. We reviewed the resident's history and exam and pertinent patient test results. I agree with the assessment, diagnosis, and plan of care documented in the resident's note. 

## 2015-07-27 NOTE — Assessment & Plan Note (Signed)
Asked to start taking gas X upto 4x daily PRN bloating. This is likely 2/2 to her prune juice intake.

## 2015-07-28 ENCOUNTER — Ambulatory Visit (INDEPENDENT_AMBULATORY_CARE_PROVIDER_SITE_OTHER): Payer: Medicaid Other | Admitting: *Deleted

## 2015-07-28 ENCOUNTER — Telehealth: Payer: Self-pay | Admitting: Nurse Practitioner

## 2015-07-28 DIAGNOSIS — Z5181 Encounter for therapeutic drug level monitoring: Secondary | ICD-10-CM

## 2015-07-28 DIAGNOSIS — I4891 Unspecified atrial fibrillation: Secondary | ICD-10-CM

## 2015-07-28 LAB — POCT INR: INR: 2.2

## 2015-07-28 MED ORDER — WARFARIN SODIUM 5 MG PO TABS
ORAL_TABLET | ORAL | Status: DC
Start: 2015-07-28 — End: 2016-03-23

## 2015-07-28 NOTE — Telephone Encounter (Signed)
Patient came to [facility] looking for Dr. Excell Seltzerooper after [facility] appointment.  She states she has terrible itching after dialysis and was advised to take Benadryl but that Dr. Excell Seltzerooper told her not to ever take a sleeping pill. I reviewed her chart and advised her that if her nephrologist prescribed Benadryl that there is not a cardiac reason that she cannot take it.  I advised that although it may make her sleepy, it is an antihistamine agent.  She and husband verbalized understanding and appreciation.

## 2015-08-02 ENCOUNTER — Telehealth: Payer: Self-pay | Admitting: Cardiovascular Disease

## 2015-08-02 NOTE — Telephone Encounter (Signed)
New Message  Request a call back to discuss the pt's heart beat that is not norma;.  Pt c/o BP issue:  1. What are your last 5 BP readings? 180/? Heart beat was 135 ( this was  On 07/31/2015 pt states that they had to call the ambulance but she didn't go to the hospital  2. Are you having any other symptoms (ex. Dizziness, headache, blurred vision, passed out)? Nausea and headaches

## 2015-08-02 NOTE — Telephone Encounter (Signed)
I spoke with the pt and she complains of periods of rapid heart beat.  She said her BP drops during dialysis and she is not taking her medication prior to dialysis.  The pt also said she is no longer taking Felodipine for her BP and she takes Metoprolol at 12 Noon and 10 PM.  The pt associates the rapid heart beat to a specific time but this in unclear when she attempts to explain the timing of these events.  The pt has a scheduled appointment with our office on 08/11/15 and I advised her to keep this appointment.  The pt has not tolerated medication changes in the past and has multiple allergies.  The pt last wore a heart monitor in 2015 and at that time we recommended increasing Amiodarone due to AFib with RVR. The pt's Amiodarone then had to be decreased due to cough. I will forward this message to Dr Excell Seltzerooper to review and give further plans to help Lauren BoozerLaura Ingold NP in treating this patient.

## 2015-08-04 NOTE — Telephone Encounter (Signed)
I don't have any other ideas. We've tried about every medication and she doesn't tolerate anything. Would continue amiodarone and possibly increase her metoprolol if BP will tolerate.

## 2015-08-11 ENCOUNTER — Encounter: Payer: Self-pay | Admitting: Cardiology

## 2015-08-11 ENCOUNTER — Ambulatory Visit (INDEPENDENT_AMBULATORY_CARE_PROVIDER_SITE_OTHER): Payer: Medicaid Other | Admitting: Cardiology

## 2015-08-11 VITALS — BP 158/60 | HR 104 | Ht 62.0 in | Wt 123.2 lb

## 2015-08-11 DIAGNOSIS — I4891 Unspecified atrial fibrillation: Secondary | ICD-10-CM | POA: Diagnosis not present

## 2015-08-11 DIAGNOSIS — R9431 Abnormal electrocardiogram [ECG] [EKG]: Secondary | ICD-10-CM

## 2015-08-11 DIAGNOSIS — N186 End stage renal disease: Secondary | ICD-10-CM

## 2015-08-11 DIAGNOSIS — I953 Hypotension of hemodialysis: Secondary | ICD-10-CM | POA: Diagnosis not present

## 2015-08-11 DIAGNOSIS — I4581 Long QT syndrome: Secondary | ICD-10-CM | POA: Diagnosis not present

## 2015-08-11 DIAGNOSIS — Z992 Dependence on renal dialysis: Secondary | ICD-10-CM

## 2015-08-11 DIAGNOSIS — R000 Tachycardia, unspecified: Secondary | ICD-10-CM

## 2015-08-11 LAB — BASIC METABOLIC PANEL
BUN: 43 mg/dL — AB (ref 7–25)
CALCIUM: 9.3 mg/dL (ref 8.6–10.4)
CO2: 30 mmol/L (ref 20–31)
CREATININE: 5.62 mg/dL — AB (ref 0.60–0.93)
Chloride: 98 mmol/L (ref 98–110)
Glucose, Bld: 97 mg/dL (ref 65–99)
Potassium: 4.4 mmol/L (ref 3.5–5.3)
Sodium: 143 mmol/L (ref 135–146)

## 2015-08-11 LAB — MAGNESIUM: Magnesium: 2.4 mg/dL (ref 1.5–2.5)

## 2015-08-11 NOTE — Patient Instructions (Signed)
Medication Instructions:  1. STOP AMIODARONE  Labwork: 1. TODAY BMET, MAGNESIUM LEVEL  Testing/Procedures: NONE  Follow-Up: 1. YOU HAVE AN APPT TO SEE DR. Ladona RidgelAYLOR 08/24/15 @ 11 AM   Any Other Special Instructions Will Be Listed Below (If Applicable).    If you need a refill on your cardiac medications before your next appointment, please call your pharmacy.

## 2015-08-11 NOTE — Progress Notes (Signed)
Cardiology Office Note   Date:  08/11/2015   ID:  [name], DOB 10/16/1935, MRN [id number]  PCP:  Hyacinth MeekerAhmed, Tasrif, MD  Cardiologist:  Dr. Excell Seltzerooper    Chief Complaint  Patient presents with  . Atrial Fibrillation    more episodes      History of Present Illness: [name] is a 80 y.o. female who presents for rapid HR at times with hx PAF.  Today HR is 104.  She has end-stage renal disease. Her cardiac issues include malignant hypertension and paroxysmal atrial fibrillation. She's been intolerant to many blood pressure medications. She had called with complaints of rapid HR.  She has BP drops with dialysis so does not take her meds prior to dialysis. She is on metoprolol and felodipine.  She is anticoagulated with coumadin followed here.   Echo 05/2015 withmild aortic stenosis and severe pulmonary HTN noted. Reviewed echo from 2013 when patient had moderate pulmonary HTN with estimated PAP 58 mmHg   Today her HR has been fast frequently.  She does not take amiodarone prior to dialysis but post. But still twice a day.  Her thyroid is stable though was 5.16 in April followed by Dr. Lucianne MussKumar now improved. .    Past Medical History  Diagnosis Date  . ESRD (end stage renal disease) (HCC)     HD for 1 year, HD on Tues, Thurs, Sat // Likely secondary to nephrosclerosis from HTN  . HTN (hypertension)   . HLD (hyperlipidemia)   . Cholelithiases     S/P biliary colic cholecystostomy, but not cholecystectomy secondary to omental adhesions  . Anxiety   . Anemia     BL Hgb 8-9. Likely AOCD in setting of ESRD with iron 48, ferritin  746 (03/2010)  . PAF (paroxysmal atrial fibrillation) (HCC)     On chronic coumadin therapy // Followed by Dr. Excell Seltzerooper (cardiology)  . Non-ST elevation MI (NSTEMI) (HCC)     Noninvasive evaluation with Myoview negative for ischemia.  // 2-D echo (05/2010) LVEF 65-70%. Grade 1 diastolic dysfunction.  Peak PA pressure 52.  Marland Kitchen. Hyperthyroidism DX: 07/2010   Likely Grave's Disease. // On diagnosis, TSH < 0.08, free T4 1.94, free T3 5.3 . //  Thyroid US (07/17/2010) - nonspecific diffuse heterogenous thyroid gland. //  Thyrotropin receptor antibody neg (07/17/2010)  . Atrial flutter (HCC)   . Anticoagulant long-term use     Past Surgical History  Procedure Laterality Date  . Other surgical history  02/2009    Cholecystostomy secondary to biliary colic - NO cholecystectomy secondary to omental adhesions - by Dr. Bertram SavinAmber Allen  . Gallbladder surgery    . Breast lumpectomy      right  . Temple artery biopsy  12/14/10     Current Outpatient Prescriptions  Medication Sig Dispense Refill  . cycloSPORINE (RESTASIS) 0.05 % ophthalmic emulsion Place 1 drop into both eyes 2 (two) times daily. 0.4 mL 0  . felodipine (PLENDIL) 2.5 MG 24 hr tablet Take 1 tablet (2.5 mg total) by mouth 2 (two) times daily. 60 tablet 11  . Polysaccharide Iron Complex (POLY-IRON 150 PO) Take 150 mg by mouth daily.    . sevelamer (RENAGEL) 800 MG tablet Take by mouth 3 (three) times daily with meals.    Marland Kitchen. SYNTHROID 150 MCG tablet TAKE ONE TABLET BY MOUTH ONCE DAILY BEFORE  BREAKFAST ON AN EMPTY STOMACH 30 tablet 5  . warfarin (COUMADIN) 5 MG tablet Take as directed by coumadin clinic 60 tablet 0  No current facility-administered medications for this visit.    Allergies:   Cardizem; Iron; Rena-vite; Codeine; Pacerone; Vancomycin; Venofer; and Latex    Social History:  The patient  reports that she has never smoked. She does not have any smokeless tobacco history on file. She reports that she does not drink alcohol or use illicit drugs.   Family History:  The patient's family history is not on file.    ROS:  General:no colds or fevers, no weight changes Skin:no rashes or ulcers HEENT:no blurred vision, no congestion CV:see HPI PUL:see HPI GI:no diarrhea constipation or melena, no indigestion GU:no hematuria, no dysuria MS:no joint pain, no claudication Neuro:no  syncope, no lightheadedness Endo:no diabetes, + thyroid disease  Wt Readings from Last 3 Encounters:  08/11/15 123 lb 3.2 oz (55.883 kg)  07/26/15 124 lb 12.8 oz (56.609 kg)  06/28/15 123 lb (55.792 kg)     PHYSICAL EXAM: VS:  BP 158/60 mmHg  Pulse 104  Ht  (1.575 m)  Wt 123 lb 3.2 oz (55.883 kg)  BMI 22.53 kg/m2 , BMI Body mass index is 22.53 kg/(m^2). General:Pleasant affect, NAD, anxious about her heart Skin:Warm and dry, brisk capillary refill HEENT:normocephalic, sclera clear, mucus membranes moist Neck:supple, no JVD, no bruits  Heart:S1S2 RRR and rapid without murmur, gallup, rub or click Lungs:clear without rales, rhonchi, or wheezes WUJ:WJXB, non tender, + BS, do not palpate liver spleen or masses Ext:no lower ext edema, 2+ radial pulses Neuro:alert and oriented X 3, MAE, follows commands, + facial symmetry    EKG:  EKG is ordered today. The ekg ordered today demonstrates sinus tach with 1st degree AV block PR 214 ms. Now with Qtc prolongation at 547 ms. Deep T wave inversions  In V1-V3  Reviewed with Dr. Excell Seltzer and Dr. Ladona Ridgel.      Recent Labs: 07/20/2015: TSH 2.13    Lipid Panel    Component Value Date/Time   CHOL 199 06/18/2013 0805   TRIG 147.0 06/18/2013 0805   HDL 63.90 06/18/2013 0805   CHOLHDL 3 06/18/2013 0805   VLDL 29.4 06/18/2013 0805   LDLCALC 106* 06/18/2013 0805   LDLDIRECT 155.1 01/09/2011 0945       Other studies Reviewed: Additional studies/ records that were reviewed today include:   CARDIAC CATH 2012: ANGIOGRAPHIC DATA: The left main coronary artery is normal.  Left anterior descending artery has scattered 20% irregularities.  Left circumflex coronary has 20% irregularities in the proximal vessel.  The right coronary artery arises and distributes normally. It has mild irregularities up to 10-20%.  Left ventricular angiography performed in the RAO view demonstrates normal left ventricular size and contractility with  normal systolic function. Ejection fraction is estimated at 60%.  FINAL INTERPRETATION: 1. Minimal nonobstructive atherosclerotic coronary artery disease. 2. Normal left ventricular function.  2 D Echo: ------------------------------------------------------------------- Study Conclusions  - Left ventricle: The cavity size was normal. Wall thickness was  increased in a pattern of mild LVH. Systolic function was  vigorous. The estimated ejection fraction was in the range of 65%  to 70%. Wall motion was normal; there were no regional wall  motion abnormalities. Features are consistent with a pseudonormal  left ventricular filling pattern, with concomitant abnormal  relaxation and increased filling pressure (grade 2 diastolic  dysfunction). - Aortic valve: Trileaflet; moderately calcified leaflets. There  was mild to moderate stenosis (mild by mean gradient, moderate by  appearance and calculated valve area). There was trivial  regurgitation. Mean gradient (S): 16 mm Hg.  Peak gradient (S): 29  mm Hg. Valve area (VTI): 1.09 cm^2. - Mitral valve: Mildly calcified annulus. There was mild  regurgitation. - Left atrium: The atrium was moderately to severely dilated. - Right ventricle: The cavity size was normal. Systolic function  was normal. - Right atrium: The atrium was mildly dilated. - Tricuspid valve: There was moderate regurgitation. Peak RV-RA  gradient (S): 85 mm Hg. - Pulmonary arteries: PA peak pressure: 93 mm Hg (S). - Systemic veins: IVC measured 1.7 cm with < 50% respirophasic  variation, suggesting RA pressure 8 mmHg.  Impressions:  - Normal LV size with vigorous systolic function, EF 65-70%.  Moderate diastolic dysfunction. Normal RV size and systolic  function. Mild to moderate aortic stenosis. Severe pulmonary  hypertension.   ASSESSMENT AND PLAN:  1.  Prolonged QTc due to amiodarone -  Per Dr. Ladona Ridgel stop amiodarone.  Explained to pt  and her husband she must stop or she could develop dangerous arrhthymias --we will check BMP and Mg+ level today. She had stopped the metoprolol 2 weeks ago due to hypotension and she did not feel it was helping her HR.  Instructed if rapid HR she should go to ER not to take any other meds. .    She will repeat EKG next week and see Dr. Ladona Ridgel in 2-3 weeks for further planning    .   2. Paroxysmal atrial fibrillation. The patient had done remarkably well on low-dose amiodarone. She's had a huge number of drug intolerances but has been able to tolerate amiodarone on a long-term basis. She also tolerates warfarin without bleeding complications.  INR followed by our office.    3. Malignant hypertension: Blood pressure control is always difficult. No changes are made today. She remains on felodipine. The hydralazine and the metoprolol have been stopped due to hypotension.  If her BP increases she could increase the felodipine.    4. ESRD on HD T-TH-Sat.  Now with hypotension with dialysis.   Current medicines are reviewed with the patient today.  The patient Has no concerns regarding medicines.  The following changes have been made:  See above Labs/ tests ordered today include:see above  Disposition:   FU:  see above  Signed, Nada Boozer, NP  08/11/2015 5:03 PM    [facility] [address] [place], [place], [place]  [address]/ [address] [place], [place] Phone: [phone]; Fax: [phone]  [phone]

## 2015-08-23 ENCOUNTER — Ambulatory Visit (INDEPENDENT_AMBULATORY_CARE_PROVIDER_SITE_OTHER): Payer: Medicaid Other | Admitting: *Deleted

## 2015-08-23 ENCOUNTER — Ambulatory Visit (INDEPENDENT_AMBULATORY_CARE_PROVIDER_SITE_OTHER): Payer: Medicaid Other | Admitting: Nurse Practitioner

## 2015-08-23 VITALS — BP 130/66 | HR 126 | Ht 62.0 in

## 2015-08-23 DIAGNOSIS — I4581 Long QT syndrome: Secondary | ICD-10-CM | POA: Diagnosis not present

## 2015-08-23 DIAGNOSIS — I4891 Unspecified atrial fibrillation: Secondary | ICD-10-CM | POA: Diagnosis not present

## 2015-08-23 DIAGNOSIS — Z5181 Encounter for therapeutic drug level monitoring: Secondary | ICD-10-CM

## 2015-08-23 DIAGNOSIS — R000 Tachycardia, unspecified: Secondary | ICD-10-CM | POA: Diagnosis not present

## 2015-08-23 DIAGNOSIS — R9431 Abnormal electrocardiogram [ECG] [EKG]: Secondary | ICD-10-CM

## 2015-08-23 LAB — POCT INR: INR: 2.4

## 2015-08-23 MED ORDER — METOPROLOL TARTRATE 25 MG PO TABS: 25.0000 mg | ORAL_TABLET | Freq: Two times a day (BID) | ORAL | Status: DC

## 2015-08-23 NOTE — Patient Instructions (Signed)
Medication Instructions:  START Metoprolol (Lopressor) 25 mg twice daily - do not take in the mornings of dialysis   Labwork: None Ordered   Testing/Procedures: None Ordered   Follow-Up: Keep your follow up appointment with Dr. Ladona Ridgelaylor   Any Other Special Instructions Will Be Listed Below (If Applicable).     If you need a refill on your cardiac medications before your next appointment, please call your pharmacy.

## 2015-08-24 ENCOUNTER — Encounter: Payer: Medicaid Other | Admitting: Internal Medicine

## 2015-09-02 ENCOUNTER — Emergency Department (HOSPITAL_COMMUNITY)
Admission: EM | Admit: 2015-09-02 | Discharge: 2015-09-02 | Disposition: A | Discharge status: Home / Self Care | Payer: Medicaid Other | Attending: Emergency Medicine | Admitting: Emergency Medicine

## 2015-09-02 ENCOUNTER — Telehealth: Payer: Self-pay | Admitting: Cardiovascular Disease

## 2015-09-02 ENCOUNTER — Encounter (HOSPITAL_COMMUNITY): Payer: Self-pay | Admitting: Emergency Medicine

## 2015-09-02 ENCOUNTER — Other Ambulatory Visit: Payer: Self-pay

## 2015-09-02 DIAGNOSIS — E039 Hypothyroidism, unspecified: Secondary | ICD-10-CM | POA: Diagnosis not present

## 2015-09-02 DIAGNOSIS — Z79899 Other long term (current) drug therapy: Secondary | ICD-10-CM | POA: Insufficient documentation

## 2015-09-02 DIAGNOSIS — I120 Hypertensive chronic kidney disease with stage 5 chronic kidney disease or end stage renal disease: Secondary | ICD-10-CM | POA: Diagnosis not present

## 2015-09-02 DIAGNOSIS — Z992 Dependence on renal dialysis: Secondary | ICD-10-CM | POA: Diagnosis not present

## 2015-09-02 DIAGNOSIS — Z91040 Latex allergy status: Secondary | ICD-10-CM | POA: Diagnosis not present

## 2015-09-02 DIAGNOSIS — R002 Palpitations: Secondary | ICD-10-CM | POA: Diagnosis present

## 2015-09-02 DIAGNOSIS — Z7901 Long term (current) use of anticoagulants: Secondary | ICD-10-CM | POA: Diagnosis not present

## 2015-09-02 DIAGNOSIS — N186 End stage renal disease: Secondary | ICD-10-CM | POA: Diagnosis not present

## 2015-09-02 DIAGNOSIS — I252 Old myocardial infarction: Secondary | ICD-10-CM | POA: Diagnosis not present

## 2015-09-02 DIAGNOSIS — I480 Paroxysmal atrial fibrillation: Secondary | ICD-10-CM | POA: Diagnosis not present

## 2015-09-02 LAB — I-STAT CHEM 8, ED
BUN: 46 mg/dL — ABNORMAL HIGH (ref 6–20)
Calcium, Ion: 1.09 mmol/L — ABNORMAL LOW (ref 1.13–1.30)
Chloride: 99 mmol/L — ABNORMAL LOW (ref 101–111)
Creatinine, Ser: 6.7 mg/dL — ABNORMAL HIGH (ref 0.44–1.00)
GLUCOSE: 99 mg/dL (ref 65–99)
HCT: 39 % (ref 36.0–46.0)
Hemoglobin: 13.3 g/dL (ref 12.0–15.0)
Potassium: 4.9 mmol/L (ref 3.5–5.1)
Sodium: 139 mmol/L (ref 135–145)
TCO2: 28 mmol/L (ref 0–100)

## 2015-09-02 MED ORDER — METOPROLOL TARTRATE 25 MG PO TABS
25.0000 mg | ORAL_TABLET | Freq: Once | ORAL | Status: AC
  Administered 2015-09-02: 25 mg via ORAL
  Filled 2015-09-02: qty 1

## 2015-09-02 NOTE — ED Notes (Signed)
PT ambulated with baseline gait; VSS; A&Ox3; no signs of distress; respirations even and unlabored; skin warm and dry; no questions upon discharge.  

## 2015-09-02 NOTE — ED Notes (Signed)
Pt sent from dialysis for irregular heart rate, diaphoresis. HR in 160's at one point. States she has been changing her metoprolol dose herself. States she has almost passed out before. Last took dose yesterday (took 1) instead of 2. Poor historian. Is supposed to see Dr. Excell Seltzerooper, cards, Monday. A&Ox3. No SOB; no nausea.

## 2015-09-02 NOTE — Telephone Encounter (Signed)
New message      Calling to let the doctor know that pt was at dialysis this am and was transferred to the hosp with a possible heart attack

## 2015-09-02 NOTE — Telephone Encounter (Signed)
WILL FORWARD  TO DR  COOPER FOR REVIEW ./CY 

## 2015-09-02 NOTE — ED Provider Notes (Signed)
CSN: [id number]     Arrival date & time 09/02/15  [id number] History   First MD Initiated Contact with Patient 09/02/15 0825     Chief Complaint  Patient presents with  . Irregular Heart Beat     (Consider location/radiation/quality/duration/timing/severity/associated sxs/prior Treatment) Patient is a 80 y.o. female presenting with palpitations. The history is provided by the patient (Patient complains of her heart rate going fast when she was in dialysis. She takes Lopressor twice a day but on dialysis days she takes her Lopressor after her dialysis).  Palpitations Palpitations quality:  Irregular Onset quality:  Sudden Timing:  Constant Progression:  Waxing and waning Chronicity:  Recurrent Context: not appetite suppressants   Associated symptoms: no back pain, no chest pain and no cough     Past Medical History  Diagnosis Date  . ESRD (end stage renal disease) (HCC)     HD for 1 year, HD on Tues, Thurs, Sat // Likely secondary to nephrosclerosis from HTN  . HTN (hypertension)   . HLD (hyperlipidemia)   . Cholelithiases     S/P biliary colic cholecystostomy, but not cholecystectomy secondary to omental adhesions  . Anxiety   . Anemia     BL Hgb 8-9. Likely AOCD in setting of ESRD with iron 48, ferritin  746 (03/2010)  . PAF (paroxysmal atrial fibrillation) (HCC)     On chronic coumadin therapy // Followed by Dr. Excell Seltzerooper (cardiology)  . Non-ST elevation MI (NSTEMI) (HCC)     Noninvasive evaluation with Myoview negative for ischemia.  // 2-D echo (05/2010) LVEF 65-70%. Grade 1 diastolic dysfunction.  Peak PA pressure 52.  Marland Kitchen. Hyperthyroidism DX: 07/2010    Likely Grave's Disease. // On diagnosis, TSH < 0.08, free T4 1.94, free T3 5.3 . //  Thyroid US (07/17/2010) - nonspecific diffuse heterogenous thyroid gland. //  Thyrotropin receptor antibody neg (07/17/2010)  . Atrial flutter (HCC)   . Anticoagulant long-term use    Past Surgical History  Procedure Laterality Date  . Other  surgical history  02/2009    Cholecystostomy secondary to biliary colic - NO cholecystectomy secondary to omental adhesions - by Dr. Bertram SavinAmber Allen  . Gallbladder surgery    . Breast lumpectomy      right  . Temple artery biopsy  12/14/10   Family History  Problem Relation Age of Onset  . Stroke    . Heart attack     Social History  Substance Use Topics  . Smoking status: Never Smoker   . Smokeless tobacco: None  . Alcohol Use: No   OB History    No data available     Review of Systems  Constitutional: Negative for appetite change and fatigue.  HENT: Negative for congestion, ear discharge and sinus pressure.   Eyes: Negative for discharge.  Respiratory: Negative for cough.   Cardiovascular: Positive for palpitations. Negative for chest pain.  Gastrointestinal: Negative for abdominal pain and diarrhea.  Genitourinary: Negative for frequency and hematuria.  Musculoskeletal: Negative for back pain.  Skin: Negative for rash.  Neurological: Negative for seizures and headaches.  Psychiatric/Behavioral: Negative for hallucinations.      Allergies  Cardizem; Iron; Rena-vite; Codeine; Pacerone; Vancomycin; Venofer; and Latex  Home Medications   Prior to Admission medications   Medication Sig Start Date End Date Taking? Authorizing Provider  cycloSPORINE (RESTASIS) 0.05 % ophthalmic emulsion Place 1 drop into both eyes 2 (two) times daily. 07/16/13  Yes Ky BarbanSolianny D Kennerly, MD  felodipine (PLENDIL) 2.5 MG 24 hr  tablet Take 1 tablet (2.5 mg total) by mouth 2 (two) times daily. 05/12/15  Yes Tonny BollmanMichael Cooper, MD  metoprolol tartrate (LOPRESSOR) 25 MG tablet Take 1 tablet (25 mg total) by mouth 2 (two) times daily. 08/23/15  Yes Tonny BollmanMichael Cooper, MD  Polysaccharide Iron Complex (POLY-IRON 150 PO) Take 150 mg by mouth daily.   Yes Camille Balynthia Dunham, MD  sevelamer (RENAGEL) 800 MG tablet Take by mouth 3 (three) times daily with meals.   Yes Historical Provider, MD  SYNTHROID 150 MCG tablet TAKE  ONE TABLET BY MOUTH ONCE DAILY BEFORE  BREAKFAST ON AN EMPTY STOMACH 01/05/15  Yes Reather LittlerAjay Kumar, MD  warfarin (COUMADIN) 5 MG tablet Take as directed by coumadin clinic Patient taking differently: Take 2.5-5 mg by mouth daily at 6 PM. 2.5mg  daily except for Thursday take 5mg . 07/28/15  Yes Tonny BollmanMichael Cooper, MD   BP 147/83 mmHg  Pulse 89  Temp(Src) 98.1 F (36.7 C) (Oral)  Resp 15  Ht 5\' 2"  (1.575 m)  SpO2 91% Physical Exam  Constitutional: She is oriented to person, place, and time. She appears well-developed.  HENT:  Head: Normocephalic.  Eyes: Conjunctivae and EOM are normal. No scleral icterus.  Neck: Neck supple. No thyromegaly present.  Cardiovascular: Exam reveals no gallop and no friction rub.   No murmur heard. Irregular rapid rate  Pulmonary/Chest: No stridor. She has no wheezes. She has no rales. She exhibits no tenderness.  Abdominal: She exhibits no distension. There is no tenderness. There is no rebound.  Musculoskeletal: Normal range of motion. She exhibits no edema.  Lymphadenopathy:    She has no cervical adenopathy.  Neurological: She is oriented to person, place, and time. She exhibits normal muscle tone. Coordination normal.  Skin: No rash noted. No erythema.  Psychiatric: She has a normal mood and affect. Her behavior is normal.    ED Course  Procedures (including critical care time) Labs Review Labs Reviewed  I-STAT CHEM 8, ED - Abnormal; Notable for the following:    Chloride 99 (*)    BUN 46 (*)    Creatinine, Ser 6.70 (*)    Calcium, Ion 1.09 (*)    All other components within normal limits    Imaging Review No results found. I have personally reviewed and evaluated these images and lab results as part of my medical decision-making.   EKG Interpretation None      MDM   Final diagnoses:  Paroxysmal atrial fibrillation [facility](HCC)   Patient was given her Lopressor 25 mg by mouth in the emergency department. Her rapid heart rate slowed down to in the  80s but was still irregular. Chemistries are unremarkable. Except for her renal failure. I spoke with cardiology who suggested that when her heart rate gets fast in dialysis to go ahead and take her dose while she is at dialysis. Patient will follow-up with her cardiologist   Bethann BerkshireJoseph Wylee Ogden, MD 09/02/15 [id number]

## 2015-09-02 NOTE — Discharge Instructions (Signed)
If your heart rate gets fast in dialysis, then you should take your metoprolol medicine that you had held that day for dialysis.  Follow up with your heart md if problems.  Go to dialysis tomorrow

## 2015-09-06 ENCOUNTER — Ambulatory Visit (INDEPENDENT_AMBULATORY_CARE_PROVIDER_SITE_OTHER): Payer: Medicaid Other | Admitting: Internal Medicine

## 2015-09-06 ENCOUNTER — Ambulatory Visit (INDEPENDENT_AMBULATORY_CARE_PROVIDER_SITE_OTHER): Payer: Medicaid Other | Admitting: Pharmacist

## 2015-09-06 ENCOUNTER — Encounter: Payer: Self-pay | Admitting: Internal Medicine

## 2015-09-06 VITALS — BP 172/58 | HR 68 | Ht 63.0 in | Wt 121.0 lb

## 2015-09-06 DIAGNOSIS — I480 Paroxysmal atrial fibrillation: Secondary | ICD-10-CM | POA: Diagnosis not present

## 2015-09-06 DIAGNOSIS — I4891 Unspecified atrial fibrillation: Secondary | ICD-10-CM | POA: Diagnosis not present

## 2015-09-06 DIAGNOSIS — Z5181 Encounter for therapeutic drug level monitoring: Secondary | ICD-10-CM

## 2015-09-06 LAB — POCT INR: INR: 1.7

## 2015-09-06 MED ORDER — AMIODARONE HCL 200 MG PO TABS: 200.0000 mg | ORAL_TABLET | Freq: Every day | ORAL | Status: AC

## 2015-09-06 NOTE — Patient Instructions (Signed)
Medication Instructions:  Your physician recommends that you continue on your current medications as directed. Please refer to the Current Medication list given to you today.   Labwork: NONE  Testing/Procedures: NONE  Follow-Up: Your physician recommends that you schedule a follow-up appointment in: 6 WEEKS  WITH  DR Ladona RidgelAYLOR Any Other Special Instructions Will Be Listed Below (If Applicable).     If you need a refill on your cardiac medications before your next appointment, please call your pharmacy.

## 2015-09-06 NOTE — Progress Notes (Signed)
HPI [name] returns today for followup. She is a pleasant 80 year old woman with a history of hypertension, symptomatic tachybradycardia syndrome, end-stage renal disease now on hemodialysis. The patient has been on amiodarone for over 5 years. She was out of rhythm several weeks ago and her QT was increased. She stopped her amio. She has tried to take metoprolol but has been intolerant. She wants to go back on the amio. Repeat ECG demonstrates nsr and her QT is not too long.  Allergies  Allergen Reactions  . Cardizem [Diltiazem Hcl] Hives  . Iron     ?? Almost died.  . Rena-Vite [B-Plex] Other (See Comments)    Unknown reaction  . Codeine Other (See Comments)    dizziness  . Pacerone [Amiodarone] Other (See Comments)    Visual changes with generic Amiodarone, the pt can only take brand name Pacerone  . Vancomycin Other (See Comments)    dizziness  . Venofer [Iron Sucrose (Iron Oxide Saccharated)] Itching  . Latex Itching and Rash     Current Outpatient Prescriptions  Medication Sig Dispense Refill  . amiodarone (PACERONE) 200 MG tablet Take 200 mg by mouth daily.    . cycloSPORINE (RESTASIS) 0.05 % ophthalmic emulsion Place 1 drop into both eyes 2 (two) times daily. 0.4 mL 0  . felodipine (PLENDIL) 2.5 MG 24 hr tablet Take 1 tablet (2.5 mg total) by mouth 2 (two) times daily. 60 tablet 11  . metoprolol tartrate (LOPRESSOR) 25 MG tablet Take 1 tablet (25 mg total) by mouth 2 (two) times daily. 60 tablet 11  . Polysaccharide Iron Complex (POLY-IRON 150 PO) Take 150 mg by mouth daily.    . sevelamer (RENAGEL) 800 MG tablet Take by mouth 3 (three) times daily with meals.    Marland Kitchen. SYNTHROID 150 MCG tablet TAKE ONE TABLET BY MOUTH ONCE DAILY BEFORE  BREAKFAST ON AN EMPTY STOMACH 30 tablet 5  . warfarin (COUMADIN) 5 MG tablet Take as directed by coumadin clinic (Patient taking differently: Take 2.5-5 mg by mouth daily at 6 PM. 2.5mg  daily except for Thursday take 5mg .) 60 tablet 0  .  amiodarone (PACERONE) 200 MG tablet Take 1 tablet (200 mg total) by mouth daily. 90 tablet 3   No current facility-administered medications for this visit.     Past Medical History  Diagnosis Date  . ESRD (end stage renal disease) (HCC)     HD for 1 year, HD on Tues, Thurs, Sat // Likely secondary to nephrosclerosis from HTN  . HTN (hypertension)   . HLD (hyperlipidemia)   . Cholelithiases     S/P biliary colic cholecystostomy, but not cholecystectomy secondary to omental adhesions  . Anxiety   . Anemia     BL Hgb 8-9. Likely AOCD in setting of ESRD with iron 48, ferritin  746 (03/2010)  . PAF (paroxysmal atrial fibrillation) (HCC)     On chronic coumadin therapy // Followed by Dr. Excell Seltzerooper (cardiology)  . Non-ST elevation MI (NSTEMI) (HCC)     Noninvasive evaluation with Myoview negative for ischemia.  // 2-D echo (05/2010) LVEF 65-70%. Grade 1 diastolic dysfunction.  Peak PA pressure 52.  Marland Kitchen. Hyperthyroidism DX: 07/2010    Likely Grave's Disease. // On diagnosis, TSH < 0.08, free T4 1.94, free T3 5.3 . //  Thyroid US (07/17/2010) - nonspecific diffuse heterogenous thyroid gland. //  Thyrotropin receptor antibody neg (07/17/2010)  . Atrial flutter (HCC)   . Anticoagulant long-term use     ROS:   All systems  reviewed and negative except as noted in the HPI.   Past Surgical History  Procedure Laterality Date  . Other surgical history  02/2009    Cholecystostomy secondary to biliary colic - NO cholecystectomy secondary to omental adhesions - by Dr. Bertram SavinAmber Hines  . Gallbladder surgery    . Breast lumpectomy      right  . Temple artery biopsy  12/14/10     Family History  Problem Relation Age of Onset  . Stroke    . Heart attack       Social History   Social History  . Marital Status: Married    Spouse Name: N/A  . Number of Children: 0  . Years of Education: 3412 th grad   Occupational History  .     Social History Main Topics  . Smoking status: Never Smoker   .  Smokeless tobacco: Not on file  . Alcohol Use: No  . Drug Use: No  . Sexual Activity: Not on file   Other Topics Concern  . Not on file   Social History Narrative   [place]ives with [place] in [place].   Housewife.  [place]     BP 172/58 mmHg  Pulse 68  Ht 5\' 3"  (1.6 m)  Wt 121 lb (54.885 kg)  BMI 21.44 kg/m2  SpO2 92%  Physical Exam:  Well appearing 80 yo woman, NAD HEENT: Unremarkable Neck:  6 cm JVD, no thyromegally Lymphatics:  No adenopathy Back:  No CVA tenderness Lungs:  Clear, no wheezes, rales, or rhonchi. HEART:  Regular rate rhythm, no murmurs, no rubs, no clicks Abd:  soft, positive bowel sounds, no organomegally, no rebound, no guarding Ext:  2 plus pulses, no edema, no cyanosis, no clubbing Skin:  No rashes no nodules Neuro:  CN II through XII intact, motor grossly intact  EKG Normal sinus rhythm.  Assess/Plan: 1. Atrial fib - she has had no recurrent episodes on amio. 2. Atrial flutter - her ECG's demonstrated slow atrial flutter with 2:1 AV conduction. I think the flutter wave was deforming her T wave and making the QT interval appear longer than it really was. 3. HTN - her blood pressure has been reasonably well controlled although it is elevated today. She is encouraged to reduce her sodium intake and undergo HD as directed. 4. Prolonged QT - I doubt that her QT is severely prolonged as her most recent ECG demonstrates that the QT is minimally prolonged.  [name],M.D.

## 2015-09-27 ENCOUNTER — Ambulatory Visit (INDEPENDENT_AMBULATORY_CARE_PROVIDER_SITE_OTHER): Payer: Medicaid Other | Admitting: Pharmacist

## 2015-09-27 DIAGNOSIS — Z5181 Encounter for therapeutic drug level monitoring: Secondary | ICD-10-CM

## 2015-09-27 DIAGNOSIS — I4891 Unspecified atrial fibrillation: Secondary | ICD-10-CM | POA: Diagnosis not present

## 2015-09-27 DIAGNOSIS — I480 Paroxysmal atrial fibrillation: Secondary | ICD-10-CM | POA: Diagnosis not present

## 2015-09-27 LAB — POCT INR: INR: 3.2

## 2015-10-18 ENCOUNTER — Ambulatory Visit (INDEPENDENT_AMBULATORY_CARE_PROVIDER_SITE_OTHER): Payer: Medicaid Other | Admitting: *Deleted

## 2015-10-18 DIAGNOSIS — I4891 Unspecified atrial fibrillation: Secondary | ICD-10-CM

## 2015-10-18 DIAGNOSIS — Z5181 Encounter for therapeutic drug level monitoring: Secondary | ICD-10-CM

## 2015-10-18 LAB — POCT INR: INR: 2.1

## 2015-10-25 ENCOUNTER — Other Ambulatory Visit (INDEPENDENT_AMBULATORY_CARE_PROVIDER_SITE_OTHER): Payer: Medicaid Other

## 2015-10-25 DIAGNOSIS — E890 Postprocedural hypothyroidism: Secondary | ICD-10-CM

## 2015-10-25 LAB — T4, FREE: FREE T4: 1.25 ng/dL (ref 0.60–1.60)

## 2015-10-25 LAB — TSH: TSH: 2.38 u[IU]/mL (ref 0.35–4.50)

## 2015-10-28 ENCOUNTER — Encounter: Payer: Self-pay | Admitting: Endocrinology

## 2015-10-28 ENCOUNTER — Ambulatory Visit (INDEPENDENT_AMBULATORY_CARE_PROVIDER_SITE_OTHER): Payer: Medicaid Other | Admitting: Endocrinology

## 2015-10-28 VITALS — BP 136/64 | HR 102 | Ht 63.0 in | Wt 121.0 lb

## 2015-10-28 DIAGNOSIS — E890 Postprocedural hypothyroidism: Secondary | ICD-10-CM

## 2015-10-28 NOTE — Progress Notes (Signed)
Patient ID: [name], female   DOB: [birth year], 80 y.o.   MRN: [id number]   Reason for Appointment:  Hypothyroidism, followup visit    History of Present Illness:   The hypothyroidism was first diagnosed after her radioactive iodine treatment for her [name]' disease  in 08/2010   The patient has been treated with various doses of levothyroxine over the years, generally requiring higher than expected dose for her weight   She usually tends to have nonspecific fatigue regardless of her thyroid levels She does have some chronic cold intolerance also Also has had variable amount of hair loss .           The patient is taking the thyroid supplement regularly before breakfast when she does not go for diagnosis and before lunch when she goes for dialysis since she does not eat in the morning She takes the medication just before before eating  Not taking any calcium or iron supplements now with the levothyroxine    Her Synthroid dose has been the same since 2/16 when it was increased to 150 g No unusual fatigue Only having some problems with itching and dry skin, more so on scalp     Wt Readings from Last 3 Encounters:  10/28/15 121 lb (54.9 kg)  09/06/15 121 lb (54.9 kg)  08/11/15 123 lb 3.2 oz (55.9 kg)    TSH levels as follows:  Lab Results  Component Value Date   TSH 2.38 10/25/2015   TSH 2.13 07/20/2015   TSH 5.16 (H) 06/23/2015   FREET4 1.25 10/25/2015   FREET4 1.20 12/21/2014   FREET4 1.24 04/13/2014        Medication List       Accurate as of 10/28/15  4:19 PM. Always use your most recent med list.          amiodarone 200 MG tablet Commonly known as:  PACERONE Take 1 tablet (200 mg total) by mouth daily.   cycloSPORINE 0.05 % ophthalmic emulsion Commonly known as:  RESTASIS Place 1 drop into both eyes 2 (two) times daily.   diphenhydrAMINE 25 MG tablet Commonly known as:  BENADRYL Take 25 mg by mouth at bedtime as needed.   felodipine  2.5 MG 24 hr tablet Commonly known as:  PLENDIL Take 1 tablet (2.5 mg total) by mouth 2 (two) times daily.   metoprolol tartrate 25 MG tablet Commonly known as:  LOPRESSOR Take 1 tablet (25 mg total) by mouth 2 (two) times daily.   POLY-IRON 150 PO Take 150 mg by mouth daily.   sevelamer 800 MG tablet Commonly known as:  RENAGEL Take by mouth 3 (three) times daily with meals.   SYNTHROID 150 MCG tablet Generic drug:  levothyroxine TAKE ONE TABLET BY MOUTH ONCE DAILY BEFORE  BREAKFAST ON AN EMPTY STOMACH   warfarin 5 MG tablet Commonly known as:  COUMADIN Take as directed by coumadin clinic       Allergies:  Allergies  Allergen Reactions  . Cardizem [Diltiazem Hcl] Hives  . Iron     ?? Almost died.  . Rena-Vite [B-Plex] Other (See Comments)    Unknown reaction  . Codeine Other (See Comments)    dizziness  . Pacerone [Amiodarone] Other (See Comments)    Visual changes with generic Amiodarone, the pt can only take brand name Pacerone  . Vancomycin Other (See Comments)    dizziness  . Venofer [Iron Sucrose (Iron Oxide Saccharated)] Itching  . Latex Itching and Rash  Past Medical History:  Diagnosis Date  . Anemia    BL Hgb 8-9. Likely AOCD in setting of ESRD with iron 48, ferritin  746 (03/2010)  . Anticoagulant long-term use   . Anxiety   . Atrial flutter (HCC)   . Cholelithiases    S/P biliary colic cholecystostomy, but not cholecystectomy secondary to omental adhesions  . ESRD (end stage renal disease) (HCC)    HD for 1 year, HD on Tues, Thurs, Sat // Likely secondary to nephrosclerosis from HTN  . HLD (hyperlipidemia)   . HTN (hypertension)   . Hyperthyroidism DX: 07/2010   Likely Grave's Disease. // On diagnosis, TSH < 0.08, free T4 1.94, free T3 5.3 . //  Thyroid US (07/17/2010) - nonspecific diffuse heterogenous thyroid gland. //  Thyrotropin receptor antibody neg (07/17/2010)  . Non-ST elevation MI (NSTEMI) (HCC)    Noninvasive evaluation with Myoview  negative for ischemia.  // 2-D echo (05/2010) LVEF 65-70%. Grade 1 diastolic dysfunction.  Peak PA pressure 52.  Marland Kitchen. PAF (paroxysmal atrial fibrillation) (HCC)    On chronic coumadin therapy // Followed by Dr. Excell Seltzerooper (cardiology)    Past Surgical History:  Procedure Laterality Date  . BREAST LUMPECTOMY     right  . GALLBLADDER SURGERY    . OTHER SURGICAL HISTORY  02/2009   Cholecystostomy secondary to biliary colic - NO cholecystectomy secondary to omental adhesions - by Dr. Bertram SavinAmber Allen  . temple artery biopsy  12/14/10    Family History  Problem Relation Age of Onset  . Stroke    . Heart attack      Social History:  reports that she has never smoked. She does not have any smokeless tobacco history on file. She reports that she does not drink alcohol or use drugs.  REVIEW Of SYSTEMS:  She has end-stage kidney disease and is on dialysis History of atrial fibrillation followed by cardiologist, Heart rate appears to be relatively high today   Examination:   BP 136/64   Pulse (!) 102   Ht 5\' 3"  (1.6 m)   Wt 121 lb (54.9 kg)   SpO2 93%   BMI 21.43 kg/m    Looks well, no pallor  Skin not unusually dry Biceps reflex on right normal    Assessment   Hypothyroidism, post ablative with relatively high thyroxine requirement  for her weight of only 122 pounds     Subjectively difficult to assess her symptoms as she has persistent nonspecific chronic fatigue and cold intolerance She is compliant with her medication daily although taking it just before eating despite instructions to take it 30 minutes before   Her TSH is fairly consistently in the normal range now with 150 g of levothyroxine She is compliant with her medication daily  She is complaining about itching and dry scalp: Advised her to follow-up with PCP   Treatment:  No change in medication dosage Again advised her to take her medication at least 15-30 minutes before eating  Follow-up in 6  months  [name] 10/28/2015, 4:19 PM

## 2015-10-28 NOTE — Patient Instructions (Signed)
Take Synthroid 1/2 hr before meals

## 2015-10-31 ENCOUNTER — Other Ambulatory Visit: Payer: Self-pay | Admitting: Endocrinology

## 2015-11-09 ENCOUNTER — Ambulatory Visit (INDEPENDENT_AMBULATORY_CARE_PROVIDER_SITE_OTHER): Payer: Medicaid Other | Admitting: Internal Medicine

## 2015-11-09 ENCOUNTER — Encounter: Payer: Self-pay | Admitting: Internal Medicine

## 2015-11-09 ENCOUNTER — Ambulatory Visit (INDEPENDENT_AMBULATORY_CARE_PROVIDER_SITE_OTHER): Payer: Medicaid Other

## 2015-11-09 VITALS — BP 156/80 | HR 96 | Ht 63.0 in | Wt 125.0 lb

## 2015-11-09 DIAGNOSIS — Z5181 Encounter for therapeutic drug level monitoring: Secondary | ICD-10-CM

## 2015-11-09 DIAGNOSIS — I480 Paroxysmal atrial fibrillation: Secondary | ICD-10-CM

## 2015-11-09 DIAGNOSIS — I4891 Unspecified atrial fibrillation: Secondary | ICD-10-CM

## 2015-11-09 LAB — POCT INR: INR: 2.1

## 2015-11-09 NOTE — Progress Notes (Signed)
HPI [name] returns today for followup. She is a pleasant 80 year old woman with a history of hypertension, symptomatic tachybradycardia syndrome, end-stage renal disease now on hemodialysis. The patient had been on amiodarone for over 6 years. Her amio had been stopped but was restarted by me when I saw her last several months ago. She c/o being dizzy when she finishes a dialysis session although I do not think she syncope.  Allergies  Allergen Reactions  . Cardizem [Diltiazem Hcl] Hives  . Iron     ?? Almost died.  . Rena-Vite [B-Plex] Other (See Comments)    Unknown reaction  . Codeine Other (See Comments)    dizziness  . Pacerone [Amiodarone] Other (See Comments)    Visual changes with generic Amiodarone, the pt can only take brand name Pacerone  . Vancomycin Other (See Comments)    dizziness  . Venofer [Iron Sucrose (Iron Oxide Saccharated)] Itching  . Latex Itching and Rash     Current Outpatient Prescriptions  Medication Sig Dispense Refill  . amiodarone (PACERONE) 200 MG tablet Take 1 tablet (200 mg total) by mouth daily. 90 tablet 3  . cycloSPORINE (RESTASIS) 0.05 % ophthalmic emulsion Place 1 drop into both eyes 2 (two) times daily. 0.4 mL 0  . felodipine (PLENDIL) 2.5 MG 24 hr tablet Take 1 tablet (2.5 mg total) by mouth 2 (two) times daily. 60 tablet 11  . Polysaccharide Iron Complex (POLY-IRON 150 PO) Take 150 mg by mouth daily.    . sevelamer (RENAGEL) 800 MG tablet Take by mouth 3 (three) times daily with meals.    Marland Kitchen SYNTHROID 150 MCG tablet TAKE ONE TABLET BY MOUTH ONCE DAILY BEFORE  BREAKFAST 30 tablet 3  . warfarin (COUMADIN) 5 MG tablet Take as directed by coumadin clinic (Patient taking differently: Take 2.5-5 mg by mouth daily at 6 PM. 2.5mg  daily except for Thursday take 5mg .) 60 tablet 0   No current facility-administered medications for this visit.      Past Medical History:  Diagnosis Date  . Anemia    BL Hgb 8-9. Likely AOCD in setting of ESRD  with iron 48, ferritin  746 (03/2010)  . Anticoagulant long-term use   . Anxiety   . Atrial flutter (HCC)   . Cholelithiases    S/P biliary colic cholecystostomy, but not cholecystectomy secondary to omental adhesions  . ESRD (end stage renal disease) (HCC)    HD for 1 year, HD on Tues, Thurs, Sat // Likely secondary to nephrosclerosis from HTN  . HLD (hyperlipidemia)   . HTN (hypertension)   . Hyperthyroidism DX: 07/2010   Likely Grave's Disease. // On diagnosis, TSH < 0.08, free T4 1.94, free T3 5.3 . //  Thyroid US (07/17/2010) - nonspecific diffuse heterogenous thyroid gland. //  Thyrotropin receptor antibody neg (07/17/2010)  . Non-ST elevation MI (NSTEMI) (HCC)    Noninvasive evaluation with Myoview negative for ischemia.  // 2-D echo (05/2010) LVEF 65-70%. Grade 1 diastolic dysfunction.  Peak PA pressure 52.  Marland Kitchen PAF (paroxysmal atrial fibrillation) (HCC)    On chronic coumadin therapy // Followed by Dr. Excell Seltzer (cardiology)    ROS:   All systems reviewed and negative except as noted in the HPI.   Past Surgical History:  Procedure Laterality Date  . BREAST LUMPECTOMY     right  . GALLBLADDER SURGERY    . OTHER SURGICAL HISTORY  02/2009   Cholecystostomy secondary to biliary colic - NO cholecystectomy secondary to omental adhesions - by Dr. Joice Lofts  Allen  . temple artery biopsy  12/14/10     Family History  Problem Relation Age of Onset  . Stroke    . Heart attack       Social History   Social History  . Marital status: Married    Spouse name: N/A  . Number of children: 0  . Years of education: 2612 th grad   Occupational History  .  Retired   Social History Main Topics  . Smoking status: Never Smoker  . Smokeless tobacco: Not on file  . Alcohol use No  . Drug use: No  . Sexual activity: Not on file   Other Topics Concern  . 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  [place]     BP (!)  156/80   Pulse 96   Ht 5\' 3"  (1.6 m)   Wt 125 lb (56.7 kg)   BMI 22.14 kg/m   Physical Exam:  Well appearing 80 yo woman, NAD HEENT: Unremarkable Neck:  6 cm JVD, no thyromegally Lymphatics:  No adenopathy Back:  No CVA tenderness Lungs:  Clear, no wheezes, rales, or rhonchi. HEART:  Regular rate rhythm, no murmurs, no rubs, no clicks Abd:  soft, positive bowel sounds, no organomegally, no rebound, no guarding Ext:  2 plus pulses, no edema, no cyanosis, no clubbing Skin:  No rashes no nodules Neuro:  CN II through XII intact, motor grossly intact  EKG Normal sinus rhythm.  Assess/Plan: 1. Atrial fib - no evidence of recurrent fibrillation.  2. Atrial flutter - her ECG's demonstrated atrial flutter with a variable AV conduction. I have recommended we proceed with DCCV. She is not willing to do so.  3. HTN - her blood pressure has been reasonably well controlled. It sounds like she is going low at the end of HD. She is encouraged to discuss this with her primary MD.  Leonia ReevesGregg Taylor,M.D.

## 2015-11-09 NOTE — Patient Instructions (Signed)
Medication Instructions:  Your physician recommends that you continue on your current medications as directed. Please refer to the Current Medication list given to you today.  Labwork: None ordered.  Testing/Procedures: None ordered.  Follow-Up: Your physician recommends that you schedule a follow-up appointment as needed.   Any Other Special Instructions Will Be Listed Below (If Applicable).     If you need a refill on your cardiac medications before your next appointment, please call your pharmacy.   

## 2015-11-30 ENCOUNTER — Observation Stay (HOSPITAL_COMMUNITY)
Admission: EM | Admit: 2015-11-30 | Discharge: 2015-12-01 | Disposition: A | Discharge status: Home / Self Care | Payer: Medicaid Other | Attending: Internal Medicine | Admitting: Internal Medicine

## 2015-11-30 ENCOUNTER — Encounter (HOSPITAL_COMMUNITY): Payer: Self-pay | Admitting: Emergency Medicine

## 2015-11-30 ENCOUNTER — Other Ambulatory Visit: Payer: Self-pay

## 2015-11-30 ENCOUNTER — Emergency Department (HOSPITAL_COMMUNITY): Payer: Medicaid Other

## 2015-11-30 DIAGNOSIS — I498 Other specified cardiac arrhythmias: Principal | ICD-10-CM | POA: Insufficient documentation

## 2015-11-30 DIAGNOSIS — Z888 Allergy status to other drugs, medicaments and biological substances status: Secondary | ICD-10-CM | POA: Diagnosis not present

## 2015-11-30 DIAGNOSIS — E875 Hyperkalemia: Secondary | ICD-10-CM | POA: Diagnosis present

## 2015-11-30 DIAGNOSIS — Z823 Family history of stroke: Secondary | ICD-10-CM | POA: Insufficient documentation

## 2015-11-30 DIAGNOSIS — Z91040 Latex allergy status: Secondary | ICD-10-CM | POA: Diagnosis not present

## 2015-11-30 DIAGNOSIS — I440 Atrioventricular block, first degree: Secondary | ICD-10-CM | POA: Insufficient documentation

## 2015-11-30 DIAGNOSIS — I272 Other secondary pulmonary hypertension: Secondary | ICD-10-CM | POA: Insufficient documentation

## 2015-11-30 DIAGNOSIS — I480 Paroxysmal atrial fibrillation: Secondary | ICD-10-CM

## 2015-11-30 DIAGNOSIS — I10 Essential (primary) hypertension: Secondary | ICD-10-CM

## 2015-11-30 DIAGNOSIS — I252 Old myocardial infarction: Secondary | ICD-10-CM | POA: Diagnosis not present

## 2015-11-30 DIAGNOSIS — N186 End stage renal disease: Secondary | ICD-10-CM | POA: Diagnosis not present

## 2015-11-30 DIAGNOSIS — I5032 Chronic diastolic (congestive) heart failure: Secondary | ICD-10-CM | POA: Insufficient documentation

## 2015-11-30 DIAGNOSIS — D638 Anemia in other chronic diseases classified elsewhere: Secondary | ICD-10-CM | POA: Insufficient documentation

## 2015-11-30 DIAGNOSIS — I2510 Atherosclerotic heart disease of native coronary artery without angina pectoris: Secondary | ICD-10-CM | POA: Diagnosis not present

## 2015-11-30 DIAGNOSIS — Z885 Allergy status to narcotic agent status: Secondary | ICD-10-CM | POA: Diagnosis not present

## 2015-11-30 DIAGNOSIS — D631 Anemia in chronic kidney disease: Secondary | ICD-10-CM | POA: Diagnosis not present

## 2015-11-30 DIAGNOSIS — R9431 Abnormal electrocardiogram [ECG] [EKG]: Secondary | ICD-10-CM | POA: Diagnosis present

## 2015-11-30 DIAGNOSIS — I4892 Unspecified atrial flutter: Secondary | ICD-10-CM | POA: Diagnosis not present

## 2015-11-30 DIAGNOSIS — I350 Nonrheumatic aortic (valve) stenosis: Secondary | ICD-10-CM | POA: Diagnosis not present

## 2015-11-30 DIAGNOSIS — I700 Atherosclerosis of aorta: Secondary | ICD-10-CM | POA: Insufficient documentation

## 2015-11-30 DIAGNOSIS — E0590 Thyrotoxicosis, unspecified without thyrotoxic crisis or storm: Secondary | ICD-10-CM | POA: Insufficient documentation

## 2015-11-30 DIAGNOSIS — Z9889 Other specified postprocedural states: Secondary | ICD-10-CM | POA: Diagnosis not present

## 2015-11-30 DIAGNOSIS — I132 Hypertensive heart and chronic kidney disease with heart failure and with stage 5 chronic kidney disease, or end stage renal disease: Secondary | ICD-10-CM | POA: Diagnosis not present

## 2015-11-30 DIAGNOSIS — Z7901 Long term (current) use of anticoagulants: Secondary | ICD-10-CM | POA: Diagnosis not present

## 2015-11-30 DIAGNOSIS — E785 Hyperlipidemia, unspecified: Secondary | ICD-10-CM | POA: Diagnosis not present

## 2015-11-30 DIAGNOSIS — Z79899 Other long term (current) drug therapy: Secondary | ICD-10-CM | POA: Diagnosis not present

## 2015-11-30 DIAGNOSIS — I4891 Unspecified atrial fibrillation: Secondary | ICD-10-CM | POA: Diagnosis not present

## 2015-11-30 DIAGNOSIS — E039 Hypothyroidism, unspecified: Secondary | ICD-10-CM

## 2015-11-30 DIAGNOSIS — I471 Supraventricular tachycardia: Secondary | ICD-10-CM | POA: Insufficient documentation

## 2015-11-30 DIAGNOSIS — E890 Postprocedural hypothyroidism: Secondary | ICD-10-CM | POA: Insufficient documentation

## 2015-11-30 DIAGNOSIS — Z8249 Family history of ischemic heart disease and other diseases of the circulatory system: Secondary | ICD-10-CM | POA: Insufficient documentation

## 2015-11-30 DIAGNOSIS — Z992 Dependence on renal dialysis: Secondary | ICD-10-CM | POA: Insufficient documentation

## 2015-11-30 LAB — URINALYSIS, ROUTINE W REFLEX MICROSCOPIC
Bilirubin Urine: NEGATIVE
Glucose, UA: NEGATIVE mg/dL
KETONES UR: NEGATIVE mg/dL
Nitrite: NEGATIVE
Specific Gravity, Urine: 1.021 (ref 1.005–1.030)
pH: 6.5 (ref 5.0–8.0)

## 2015-11-30 LAB — URINE MICROSCOPIC-ADD ON

## 2015-11-30 LAB — BASIC METABOLIC PANEL
ANION GAP: 17 — AB (ref 5–15)
BUN: 50 mg/dL — ABNORMAL HIGH (ref 6–20)
CHLORIDE: 98 mmol/L — AB (ref 101–111)
CO2: 23 mmol/L (ref 22–32)
Calcium: 9.2 mg/dL (ref 8.9–10.3)
Creatinine, Ser: 7.2 mg/dL — ABNORMAL HIGH (ref 0.44–1.00)
GFR calc non Af Amer: 5 mL/min — ABNORMAL LOW (ref >60)
GFR, EST AFRICAN AMERICAN: 6 mL/min — AB (ref >60)
Glucose, Bld: 122 mg/dL — ABNORMAL HIGH (ref 65–99)
Potassium: 6.1 mmol/L — ABNORMAL HIGH (ref 3.5–5.1)
SODIUM: 138 mmol/L (ref 135–145)

## 2015-11-30 LAB — CBC
HCT: 35.1 % — ABNORMAL LOW (ref 36.0–46.0)
HEMOGLOBIN: 10.6 g/dL — AB (ref 12.0–15.0)
MCH: 27.2 pg (ref 26.0–34.0)
MCHC: 30.2 g/dL (ref 30.0–36.0)
MCV: 90 fL (ref 78.0–100.0)
Platelets: 264 10*3/uL (ref 150–400)
RBC: 3.9 MIL/uL (ref 3.87–5.11)
RDW: 18.2 % — ABNORMAL HIGH (ref 11.5–15.5)
WBC: 7.6 10*3/uL (ref 4.0–10.5)

## 2015-11-30 LAB — I-STAT TROPONIN, ED: TROPONIN I, POC: 0.04 ng/mL (ref 0.00–0.08)

## 2015-11-30 LAB — PROTIME-INR
INR: 2.76
PROTHROMBIN TIME: 29.7 s — AB (ref 11.4–15.2)

## 2015-11-30 MED ORDER — LIDOCAINE HCL (PF) 1 % IJ SOLN: 5.0000 mL | INTRAMUSCULAR | Status: DC | PRN

## 2015-11-30 MED ORDER — WARFARIN SODIUM 5 MG PO TABS
5.0000 mg | ORAL_TABLET | ORAL | Status: DC
Start: 2015-12-02 — End: 2015-12-01

## 2015-11-30 MED ORDER — SODIUM CHLORIDE 0.9% FLUSH
3.0000 mL | Freq: Two times a day (BID) | INTRAVENOUS | Status: DC
  Administered 2015-11-30 – 2015-12-01 (×2): 3 mL via INTRAVENOUS

## 2015-11-30 MED ORDER — METOPROLOL TARTRATE 5 MG/5ML IV SOLN
5.0000 mg | INTRAVENOUS | Status: DC | PRN
  Administered 2015-11-30: 5 mg via INTRAVENOUS
  Filled 2015-11-30: qty 5

## 2015-11-30 MED ORDER — PENTAFLUOROPROP-TETRAFLUOROETH EX AERO: 1.0000 "application " | INHALATION_SPRAY | CUTANEOUS | Status: DC | PRN

## 2015-11-30 MED ORDER — CYCLOSPORINE 0.05 % OP EMUL
1.0000 [drp] | Freq: Two times a day (BID) | OPHTHALMIC | Status: DC
  Administered 2015-11-30 – 2015-12-01 (×3): 1 [drp] via OPHTHALMIC
  Filled 2015-11-30 (×5): qty 1

## 2015-11-30 MED ORDER — WARFARIN - PHARMACIST DOSING INPATIENT: Freq: Every day | Status: DC

## 2015-11-30 MED ORDER — SENNOSIDES-DOCUSATE SODIUM 8.6-50 MG PO TABS: 1.0000 | ORAL_TABLET | Freq: Every evening | ORAL | Status: DC | PRN

## 2015-11-30 MED ORDER — WARFARIN SODIUM 2.5 MG PO TABS
2.5000 mg | ORAL_TABLET | ORAL | Status: DC
  Administered 2015-11-30 – 2015-12-01 (×2): 2.5 mg via ORAL
  Filled 2015-11-30 (×3): qty 1

## 2015-11-30 MED ORDER — SODIUM CHLORIDE 0.9 % IV SOLN: 100.0000 mL | INTRAVENOUS | Status: DC | PRN

## 2015-11-30 MED ORDER — SEVELAMER CARBONATE 800 MG PO TABS
1600.0000 mg | ORAL_TABLET | Freq: Three times a day (TID) | ORAL | Status: DC
  Administered 2015-11-30 – 2015-12-01 (×5): 1600 mg via ORAL
  Filled 2015-11-30 (×7): qty 2

## 2015-11-30 MED ORDER — HEPARIN SODIUM (PORCINE) 1000 UNIT/ML DIALYSIS: 1400.0000 [IU] | Freq: Once | INTRAMUSCULAR | Status: DC

## 2015-11-30 MED ORDER — AMIODARONE HCL 200 MG PO TABS
200.0000 mg | ORAL_TABLET | Freq: Every day | ORAL | Status: DC
  Administered 2015-11-30 – 2015-12-01 (×2): 200 mg via ORAL
  Filled 2015-11-30 (×2): qty 1

## 2015-11-30 MED ORDER — POLYSACCHARIDE IRON COMPLEX 150 MG PO CAPS
150.0000 mg | ORAL_CAPSULE | Freq: Every day | ORAL | Status: DC
  Administered 2015-12-01: 150 mg via ORAL
  Filled 2015-11-30 (×2): qty 1

## 2015-11-30 MED ORDER — LIDOCAINE-PRILOCAINE 2.5-2.5 % EX CREA: 1.0000 "application " | TOPICAL_CREAM | CUTANEOUS | Status: DC | PRN

## 2015-11-30 MED ORDER — LEVOTHYROXINE SODIUM 150 MCG PO TABS
150.0000 ug | ORAL_TABLET | Freq: Every day | ORAL | Status: DC
  Administered 2015-11-30 – 2015-12-01 (×2): 150 ug via ORAL
  Filled 2015-11-30: qty 1
  Filled 2015-11-30: qty 2
  Filled 2015-11-30 (×2): qty 1
  Filled 2015-11-30: qty 2

## 2015-11-30 NOTE — ED Provider Notes (Signed)
[facility]EM[facility]ENCY DEPT Provider Note   CSN: [id number] Arrival date & time: 11/30/15  0410    History   Chief Complaint Chief Complaint  Patient presents with  . Atrial Fibrillation    RVR    HPI [name] is a 80 y.o. female.  80 year old female with a history of end-stage renal disease, on T/Th/Sa dialysis, HTN, HLD, PAF, and NSTEMI presents to the ED for evaluation of generalized weakness. Patient states that she felt weak all evening associated with diaphoresis. Patient denies any modifying factors of her symptoms. She states that the symptoms went on "too long" so she had her husband call EMS at 0300. She states that she has not had any recent fevers or associated chest pain, shortness of breath, nausea, vomiting, or abdominal pain. She reports compliance with her daily medications. She has not had any syncope prior to arrival.  Cardiologist - Dr. Sharrell KuGreg Taylor   The history is provided by the patient. No language interpreter was used.  Atrial Fibrillation     Past Medical History:  Diagnosis Date  . Anemia    BL Hgb 8-9. Likely AOCD in setting of ESRD with iron 48, ferritin  746 (03/2010)  . Anticoagulant long-term use   . Anxiety   . Atrial flutter (HCC)   . Cholelithiases    S/P biliary colic cholecystostomy, but not cholecystectomy secondary to omental adhesions  . ESRD (end stage renal disease) (HCC)    HD for 1 year, HD on Tues, Thurs, Sat // Likely secondary to nephrosclerosis from HTN  . HLD (hyperlipidemia)   . HTN (hypertension)   . Hyperthyroidism DX: 07/2010   Likely Grave's Disease. // On diagnosis, TSH < 0.08, free T4 1.94, free T3 5.3 . //  Thyroid US (07/17/2010) - nonspecific diffuse heterogenous thyroid gland. //  Thyrotropin receptor antibody neg (07/17/2010)  . Non-ST elevation MI (NSTEMI) (HCC)    Noninvasive evaluation with Myoview negative for ischemia.  // 2-D echo (05/2010) LVEF 65-70%. Grade 1 diastolic dysfunction.  Peak PA pressure 52.  Marland Kitchen.  PAF (paroxysmal atrial fibrillation) (HCC)    On chronic coumadin therapy // Followed by Dr. Excell Seltzerooper (cardiology)    Patient Active Problem List   Diagnosis Date Noted  . Colicky RUQ abdominal pain 06/14/2015  . Bloating 10/26/2014  . Orthopnea 02/17/2014  . Seasonal allergies 02/17/2014  . CN (constipation) 02/17/2014  . Postnasal drip 07/16/2013  . Dry eyes 07/16/2013  . Encounter for therapeutic drug monitoring 06/18/2013  . Other postablative hypothyroidism 04/10/2013  . Lower extremity weakness 12/03/2012  . Back pain, lumbosacral 04/15/2012  . Vitamin B 12 deficiency 03/20/2012  . Anticoagulant long-term use   . Atrial flutter (HCC) 04/20/2011  . End stage renal disease on dialysis (HCC) 12/07/2010    Class: Chronic  . Gastritis 12/05/2010  . Chronic diastolic congestive heart failure (HCC) 10/11/2009  . UNSPECIFIED VITAMIN D DEFICIENCY 06/24/2009  . HTN (hypertension) 03/17/2008  . ATRIAL FIBRILLATION 02/22/2008  . HYPERLIPIDEMIA 04/18/2006  . Anemia of chronic disease 04/18/2006  . ANXIETY 04/18/2006  . ADVEF, DRUG/MED/BIOL SUBST, DRUG ALLERGY NEC 04/18/2006    Past Surgical History:  Procedure Laterality Date  . BREAST LUMPECTOMY     right  . GALLBLADDER SURGERY    . OTHER SURGICAL HISTORY  02/2009   Cholecystostomy secondary to biliary colic - NO cholecystectomy secondary to omental adhesions - by Dr. Bertram SavinAmber Allen  . temple artery biopsy  12/14/10    OB History    No data available  Home Medications    Prior to Admission medications   Medication Sig Start Date End Date Taking? Authorizing Provider  amiodarone (PACERONE) 200 MG tablet Take 1 tablet (200 mg total) by mouth daily. 09/06/15   Marinus Maw, MD  cycloSPORINE (RESTASIS) 0.05 % ophthalmic emulsion Place 1 drop into both eyes 2 (two) times daily. 07/16/13   Ky Barban, MD  felodipine (PLENDIL) 2.5 MG 24 hr tablet Take 1 tablet (2.5 mg total) by mouth 2 (two) times daily. 05/12/15    Tonny Bollman, MD  Polysaccharide Iron Complex (POLY-IRON 150 PO) Take 150 mg by mouth daily.    Camille Bal, MD  sevelamer (RENAGEL) 800 MG tablet Take by mouth 3 (three) times daily with meals.    Historical Provider, MD  SYNTHROID 150 MCG tablet TAKE ONE TABLET BY MOUTH ONCE DAILY BEFORE  BREAKFAST 11/01/15   Reather Littler, MD  warfarin (COUMADIN) 5 MG tablet Take as directed by coumadin clinic Patient taking differently: Take 2.5-5 mg by mouth daily at 6 PM. 2.5mg  daily except for Thursday take 5mg . 07/28/15   Tonny Bollman, MD    Family History Family History  Problem Relation Age of Onset  . Stroke    . Heart attack      Social History Social History  Substance Use Topics  . Smoking status: Never Smoker  . Smokeless tobacco: Never Used  . Alcohol use No     Allergies   Cardizem [diltiazem hcl]; Iron; Rena-vite [b-plex]; Codeine; Pacerone [amiodarone]; Vancomycin; Venofer [iron sucrose (iron oxide saccharated)]; and Latex   Review of Systems Review of Systems  Constitutional: Positive for fever.  Neurological: Positive for weakness (generalized).  Ten systems reviewed and are negative for acute change, except as noted in the HPI.     Physical Exam Updated Vital Signs BP 109/73   Pulse (!) 121   Temp 97.4 F (36.3 C) (Oral)   Resp 16   Ht 5\' 3"  (1.6 m)   Wt 53 kg   SpO2 90%   BMI 20.70 kg/m   Physical Exam  Constitutional: She is oriented to person, place, and time. She appears well-developed and well-nourished. No distress.  Pleasant and nontoxic  HENT:  Head: Normocephalic and atraumatic.  Eyes: Conjunctivae and EOM are normal. No scleral icterus.  Neck: Normal range of motion.  Cardiovascular: Intact distal pulses.  An irregularly irregular rhythm present. Tachycardia present.   Pulmonary/Chest: Effort normal. No respiratory distress. She has no wheezes. She has no rales.  Respirations even and unlabored  Abdominal: Soft. She exhibits no distension.  There is no tenderness. There is no guarding.  Musculoskeletal: Normal range of motion.  Neurological: She is alert and oriented to person, place, and time.  GCS 15. Speech is goal oriented. Patient moving all extremities.  Skin: Skin is warm and dry. No rash noted. She is not diaphoretic. No erythema. No pallor.  Psychiatric: She has a normal mood and affect. Her behavior is normal.  Nursing note and vitals reviewed.    ED Treatments / Results  Labs (all labs ordered are listed, but only abnormal results are displayed) Labs Reviewed  BASIC METABOLIC PANEL - Abnormal; Notable for the following:       Result Value   Potassium 6.1 (*)    Chloride 98 (*)    Glucose, Bld 122 (*)    BUN 50 (*)    Creatinine, Ser 7.20 (*)    GFR calc non Af Amer 5 (*)  GFR calc Af Amer 6 (*)    Anion gap 17 (*)    All other components within normal limits  CBC - Abnormal; Notable for the following:    Hemoglobin 10.6 (*)    HCT 35.1 (*)    RDW 18.2 (*)    All other components within normal limits  PROTIME-INR - Abnormal; Notable for the following:    Prothrombin Time 29.7 (*)    All other components within normal limits  I-STAT TROPOININ, ED    EKG  EKG Interpretation  Date/Time:  Tuesday November 30 2015 04:10:25 EDT Ventricular Rate:  124 PR Interval:    QRS Duration: 104 QT Interval:  373 QTC Calculation: 519 R Axis:   -162 Text Interpretation:  Sinus tachycardia with irregular rate versus A fib with RVR Probable RVH w/ secondary repol abnormality Prolonged QT interval Confirmed by Jadene Pierini, KRISTEN [phone]) on 11/30/2015 4:30:20 AM       Radiology Dg Chest Portable 1 View  Result Date: 11/30/2015 CLINICAL DATA:  Unstable atrial fibrillation.  Dialysis patient. EXAM: PORTABLE CHEST 1 VIEW COMPARISON:  12/18/2014 FINDINGS: Cardiac enlargement. No pulmonary vascular congestion. Small left pleural effusion with basilar atelectasis demonstrated. No pneumothorax. No focal  consolidation. Calcification of the aorta. Vascular graft in the left subclavian region. IMPRESSION: Cardiac enlargement. Small left pleural effusion with basilar atelectasis. Electronically Signed   By: Burman Nieves M.D.   On: 11/30/2015 04:43    Procedures Procedures (including critical care time)  Medications Ordered in ED Medications  metoprolol (LOPRESSOR) injection 5 mg (5 mg Intravenous Given 11/30/15 0530)  amiodarone (PACERONE) tablet 200 mg (not administered)     Initial Impression / Assessment and Plan / ED Course  I have reviewed the triage vital signs and the nursing notes.  Pertinent labs & imaging results that were available during my care of the patient were reviewed by me and considered in my medical decision making (see chart for details).  Clinical Course    Patient presenting for diaphoresis and generalized weakness. She was in A fib with RVR on arrival. No c/o chest pain or SOB. No fevers, N/V. Laboratory work up is reassuring. Hyperkalemia presumed secondary to need for dialysis today. No EKG changes as a result of hyperK. Plan to assess delta troponin at 8:00AM. Rate has improved with 5mg  IV Metoprolol. Patient signed out to [facility], PA-C at shift change who will follow and reassess.   Final Clinical Impressions(s) / ED Diagnoses   Final diagnoses:  Atrial fibrillation with RVR (HCC)  Hyperkalemia    New Prescriptions New Prescriptions   No medications on file     Antony Madura, PA-C 11/30/15 0622    Layla Maw Ward, DO 11/30/15 [phone]

## 2015-11-30 NOTE — ED Notes (Signed)
MD at bedside. 

## 2015-11-30 NOTE — ED Notes (Signed)
Pt came in with elevated heart rate and was given lopressor and holding in 110s with pacerone.  Pt elevated potassium and awaiting dialysis today.  Pt is alert and oriented.  Left upper arm HD graft, positive bruit and thrill

## 2015-11-30 NOTE — Consult Note (Signed)
CARDIOLOGY CONSULT NOTE   Patient ID: [name] MRN: [id number] DOB/AGE: September 19, 1935 80 y.o.  Admit date: 11/30/2015  Primary Physician   Hyacinth Meeker, MD Primary Cardiologist   Dr. Excell Seltzer EP: Dr. Ladona Ridgel Reason for Consultation   Afib/aflutter Requesting Physician  Dr. Drue Second  HPI: [name] is a 80 y.o. female with a history of atrial fibrillation/flutter treated with amiodarone and Coumadin, symptomatic tachybradycardia syndrome, malignant hypertension, hyperthyroidism and end-stage renal disease on dialysis who presented for evaluation of fatigue and found to be afib/aflutter with RVR.   She had a cardiac catheterization back in March 2012 that showed nonobstructive coronary artery disease. Ejection fraction was 65-70%.  Echo 05/2015 withmild aortic stenosis and severe pulmonary HTN noted. Reviewed echo from 2013 when patient had moderate pulmonary HTN with estimated PAP 58 mmHg.  Her Amiodarone has been stopped in past however restarted by Dr. Ladona Ridgel several months ago. Last seen by Dr. Ladona Ridgel 11/09/15. Felt variable AV conduction of aflutter--> recommended DCCV however declined. ? QT prolongation recently.   She continues to have hypotension with tachycardia during dialysis. Dizziness after finishing dialysis. No syncope. She been feeling weak for the past few days. Last night symptoms worsen and felt "sweaty with heart racing" ? Sob leading to ER presentation. No CP, LE edema, orthopnea or PND. No melena or blood in her stool. EKG on presentation showed sinus tachycardia with afib/aflutter and NSVT. Given metoprolol IV 5 mg x1 with improvement of rate to 90s, however jumped back to 110-120s with variable AV conduction, likely aflutter or junctional rhythm and prolonged QT.   K of 6.1. Scr of 7.2. POC troponin 0.04. Hgb of 10.6. Small left pleural effusion in CXR.   Past Medical History:  Diagnosis Date  . Anemia    BL Hgb 8-9. Likely AOCD in setting of ESRD with iron  48, ferritin  746 (03/2010)  . Anticoagulant long-term use   . Anxiety   . Atrial flutter (HCC)   . Cholelithiases    S/P biliary colic cholecystostomy, but not cholecystectomy secondary to omental adhesions  . ESRD (end stage renal disease) (HCC)    HD for 1 year, HD on Tues, Thurs, Sat // Likely secondary to nephrosclerosis from HTN  . HLD (hyperlipidemia)   . HTN (hypertension)   . Hyperthyroidism DX: 07/2010   Likely Grave's Disease. // On diagnosis, TSH < 0.08, free T4 1.94, free T3 5.3 . //  Thyroid US (07/17/2010) - nonspecific diffuse heterogenous thyroid gland. //  Thyrotropin receptor antibody neg (07/17/2010)  . Non-ST elevation MI (NSTEMI) (HCC)    Noninvasive evaluation with Myoview negative for ischemia.  // 2-D echo (05/2010) LVEF 65-70%. Grade 1 diastolic dysfunction.  Peak PA pressure 52.  Marland Kitchen PAF (paroxysmal atrial fibrillation) (HCC)    On chronic coumadin therapy // Followed by Dr. Excell Seltzer (cardiology)     Past Surgical History:  Procedure Laterality Date  . BREAST LUMPECTOMY     right  . GALLBLADDER SURGERY    . OTHER SURGICAL HISTORY  02/2009   Cholecystostomy secondary to biliary colic - NO cholecystectomy secondary to omental adhesions - by Dr. Bertram Savin  . temple artery biopsy  12/14/10    Allergies  Allergen Reactions  . Cardizem [Diltiazem Hcl] Hives  . Iron     ?? Almost died.  . Rena-Vite [B-Plex] Other (See Comments)    Unknown reaction  . Codeine Other (See Comments)    dizziness  . Pacerone [Amiodarone] Other (See Comments)  Visual changes with generic Amiodarone, the pt can only take brand name Pacerone  . Vancomycin Other (See Comments)    dizziness  . Venofer [Iron Sucrose (Iron Oxide Saccharated)] Itching  . Latex Itching and Rash    I have reviewed the patient's current medications . amiodarone  200 mg Oral Daily     metoprolol  Prior to Admission medications   Medication Sig Start Date End Date Taking? Authorizing Provider    amiodarone (PACERONE) 200 MG tablet Take 1 tablet (200 mg total) by mouth daily. 09/06/15  Yes Marinus MawGregg W Taylor, MD  cycloSPORINE (RESTASIS) 0.05 % ophthalmic emulsion Place 1 drop into both eyes 2 (two) times daily. 07/16/13  Yes Ky BarbanSolianny D Kennerly, MD  Polysaccharide Iron Complex (POLY-IRON 150 PO) Take 150 mg by mouth daily.   Yes Camille Balynthia Dunham, MD  sevelamer (RENAGEL) 800 MG tablet Take 1,600 mg by mouth 3 (three) times daily with meals.    Yes Historical Provider, MD  SYNTHROID 150 MCG tablet TAKE ONE TABLET BY MOUTH ONCE DAILY BEFORE  BREAKFAST 11/01/15  Yes Reather LittlerAjay Kumar, MD  warfarin (COUMADIN) 5 MG tablet Take as directed by coumadin clinic Patient taking differently: Take 5 mg by mouth daily.  07/28/15  Yes Tonny BollmanMichael Cooper, MD     Social History   Social History  . Marital status: Married    Spouse name: N/A  . Number of children: 0  . Years of education: 2612 th grad   Occupational History  .  Retired   Social History Main Topics  . Smoking status: Never Smoker  . Smokeless tobacco: Never Used  . Alcohol use No  . Drug use: No  . Sexual activity: Not on file   Other Topics Concern  . Not on file   Social History Narrative   [place].   Housewife.  Originally from [place]    Family Status  Relation Status  . Mother Deceased  . Father Deceased  .     Family History  Problem Relation Age of Onset  . Stroke    . Heart attack       ROS:  Full 14 point review of systems complete and found to be negative unless listed above.  Physical Exam: Blood pressure 130/89, pulse (!) 122, temperature 98.7 F (37.1 C), temperature source Rectal, resp. rate 20, height 5\' 3"  (1.6 m), weight 116 lb 13.5 oz (53 kg), SpO2 92 %.  General: Well developed, well nourished, female in no acute distress Head: Eyes PERRLA, No xanthomas. Normocephalic and atraumatic, oropharynx without edema or exudate.  Lungs: Resp regular and unlabored,Faint Left rales. Heart: regular  tachycardiac no s3, s4, or murmurs..   Neck: No carotid bruits. No lymphadenopathy.  No JVD. Abdomen: Bowel sounds present, abdomen soft and non-tender without masses or hernias noted. Msk:  No spine or cva tenderness. No weakness, no joint deformities or effusions. Extremities: No clubbing, cyanosis or edema. DP/PT/Radials 2+ and equal bilaterally. Neuro: Alert and oriented X 3. No focal deficits noted. Psych:  Good affect, responds appropriately Skin: No rashes or lesions noted.  Labs:   Lab Results  Component Value Date   WBC 7.6 11/30/2015   HGB 10.6 (L) 11/30/2015   HCT 35.1 (L) 11/30/2015   MCV 90.0 11/30/2015   PLT 264 11/30/2015    Recent Labs  11/30/15 0425  INR 2.76    Recent Labs Lab 11/30/15 0425  NA 138  K 6.1*  CL 98*  CO2 23  BUN 50*  CREATININE 7.20*  CALCIUM 9.2  GLUCOSE 122*   Magnesium  Date Value Ref Range Status  08/11/2015 2.4 1.5 - 2.5 mg/dL Final   No results for input(s): CKTOTAL, CKMB, TROPONINI in the last 72 hours.  Recent Labs  11/30/15 0436  TROPIPOC 0.04   No results found for: DDIMER No results found for: LIPASE, AMYLASE TSH  Date/Time Value Ref Range Status  10/25/2015 03:49 PM 2.38 0.35 - 4.50 uIU/mL Final   T3, Free  Date/Time Value Ref Range Status  12/21/2014 03:47 PM 1.8 (L) 2.3 - 4.2 pg/mL Final    Echo: 05/2015 LV EF: 65% -   70%  ------------------------------------------------------------------- Indications:      I48.91 Atrial Fibrillation.  ------------------------------------------------------------------- History:   PMH:  Acquired from the patient and from the patient&'s chart.  PMH:  End Stage Renal Disease. Paroxysmal Atrial Fibrillation/Atrial Flutter. NSTEMI. Hyperthyroidism.  Risk factors:  Hypertension. Dyslipidemia.  ------------------------------------------------------------------- Study Conclusions  - Left ventricle: The cavity size was normal. Wall thickness was   increased in a  pattern of mild LVH. Systolic function was   vigorous. The estimated ejection fraction was in the range of 65%   to 70%. Wall motion was normal; there were no regional wall   motion abnormalities. Features are consistent with a pseudonormal   left ventricular filling pattern, with concomitant abnormal   relaxation and increased filling pressure (grade 2 diastolic   dysfunction). - Aortic valve: Trileaflet; moderately calcified leaflets. There   was mild to moderate stenosis (mild by mean gradient, moderate by   appearance and calculated valve area). There was trivial   regurgitation. Mean gradient (S): 16 mm Hg. Peak gradient (S): 29   mm Hg. Valve area (VTI): 1.09 cm^2. - Mitral valve: Mildly calcified annulus. There was mild   regurgitation. - Left atrium: The atrium was moderately to severely dilated. - Right ventricle: The cavity size was normal. Systolic function   was normal. - Right atrium: The atrium was mildly dilated. - Tricuspid valve: There was moderate regurgitation. Peak RV-RA   gradient (S): 85 mm Hg. - Pulmonary arteries: PA peak pressure: 93 mm Hg (S). - Systemic veins: IVC measured 1.7 cm with < 50% respirophasic   variation, suggesting RA pressure 8 mmHg.  Impressions:  - Normal LV size with vigorous systolic function, EF 65-70%.   Moderate diastolic dysfunction. Normal RV size and systolic   function. Mild to moderate aortic stenosis. Severe pulmonary   hypertension.   Radiology:  Dg Chest Portable 1 View  Result Date: 11/30/2015 CLINICAL DATA:  Unstable atrial fibrillation.  Dialysis patient. EXAM: PORTABLE CHEST 1 VIEW COMPARISON:  12/18/2014 FINDINGS: Cardiac enlargement. No pulmonary vascular congestion. Small left pleural effusion with basilar atelectasis demonstrated. No pneumothorax. No focal consolidation. Calcification of the aorta. Vascular graft in the left subclavian region. IMPRESSION: Cardiac enlargement. Small left pleural effusion with basilar  atelectasis. Electronically Signed   By: Burman Nieves M.D.   On: 11/30/2015 04:43    ASSESSMENT AND PLAN:     1. Atrial arrhythmias - Rate improved temperately after IV metoprolol 5mg  x 1.   Correct electrolyte abnormality. She has decline ablation in past as well as DCCV recently.  - INR of 2.76. Continue coumadin and amiodarone for now.  Will review with MD.  2. Malignant hypertension - Blood pressure control is always difficult. She is hypotension during and after HD and feels dizzy. BP has been stable here.   3. ESRD   Signed: Bhagat,Bhavinkumar,  PA 11/30/2015, 9:55 AM Pager [phone]   I have seen, examined the patient, and reviewed the above assessment and plan. On exam, RRR.  Changes to above are made where necessary.  Pt with known atrial arrhythmias including afib and atypical appearing atrial flutter.  She now presents with atach.  Chronically on coumadin and amiodarone and followed by Dr Ladona Ridgel. With low dose IV metoprolol she has converted to sinus.  She did well previously with metoprolol however this was discontinued several months ago for unclear reasons. Would start metoprolol 12.5mg  BID. Follow-up with Dr Ladona Ridgel in next few weeks OK to discharge from my standpoint.  Electrophysiology team to see as needed while here. Please call with questions.   Co Sign: Hillis Range, MD 11/30/2015

## 2015-11-30 NOTE — ED Notes (Signed)
Pharmacy emailed for missing medication.

## 2015-11-30 NOTE — ED Notes (Signed)
Discussed with husband plan to admit.

## 2015-11-30 NOTE — Progress Notes (Signed)
Pt declined to do her admission tonight stating she was tired and just wanted to sleep. Will complete when pt is more appropriate.

## 2015-11-30 NOTE — ED Notes (Signed)
Pt resting quietly, water given.

## 2015-11-30 NOTE — ED Notes (Signed)
Meal tray at bedside.  

## 2015-11-30 NOTE — ED Provider Notes (Addendum)
PROGRESS NOTE                                                                                                                 This is a sign-out from PA Humes at shift change: [name] is a 80 y.o. female presenting with weakness and diaphoresis. Patient is found to be tachycardic in the 130s. History of A. fib, appropriately anticoagulated with an INR that is therapeutic. Patient was given a single dose of 5 mg metoprolol and tachycardia has resolved. Patient remains asymptomatic in the ED, given the diaphoresis will check a delta troponin at 8 AM. Patient has a hyperkalemia of 6.1. Please refer to previous note for full HPI, ROS, PMH and PE.   6:10 AM: Patient seen and evaluated the bedside, she is diaphoretic and appears uncomfortable. She denies any chest pain, nausea, palpitations. She states that she always gets "sweaty "when her blood pressure gets low when she thinks that the metoprolol is making her blood pressure gets low. Patient is tachycardic with a heart rate of around 115. Lungs clear, trace bilateral lower extremity edema. Patient does make urine, she dialyzes course [place].  Repeat EKG with junctional changes, different rhythm than earlier today. Will need admission and cardiology evaluation in addition to dialysis.  Discussed with internal medicine resident who accepts admission under attending Dr. Ilsa IhaSnyder  Discussed with Rosann Auerbachrish from cardiology, they will consult and evaluate the patient.      Lauren Emeryicole Ece Cumberland, PA-C 11/30/15 0739    Lauren EmeryNicole Lewie Deman, PA-C 11/30/15 [phone]

## 2015-11-30 NOTE — Procedures (Signed)
  I was present at this dialysis session, have reviewed the session itself and made  appropriate changes Vinson Moselleob Javeah Loeza MD [facility] pager [phone]    cell [phone] 11/30/2015, 4:38 PM

## 2015-11-30 NOTE — Progress Notes (Signed)
ANTICOAGULATION CONSULT NOTE - Initial Consult  Pharmacy Consult for warfarin Indication: atrial fibrillation  Allergies  Allergen Reactions  . Cardizem [Diltiazem Hcl] Hives  . Iron     ?? Almost died.  . Rena-Vite [B-Plex] Other (See Comments)    Unknown reaction  . Codeine Other (See Comments)    dizziness  . Pacerone [Amiodarone] Other (See Comments)    Visual changes with generic Amiodarone, the pt can only take brand name Pacerone  . Vancomycin Other (See Comments)    dizziness  . Venofer [Iron Sucrose (Iron Oxide Saccharated)] Itching  . Latex Itching and Rash    Patient Measurements: Height: 5\' 3"  (160 cm) Weight: 116 lb 13.5 oz (53 kg) IBW/kg (Calculated) : 52.4  Vital Signs: Temp: 98.7 F (37.1 C) (09/19 0630) Temp Source: Rectal (09/19 0630) BP: 142/90 (09/19 1024) Pulse Rate: 117 (09/19 1024)  Labs:  Recent Labs  11/30/15 0425  HGB 10.6*  HCT 35.1*  PLT 264  LABPROT 29.7*  INR 2.76  CREATININE 7.20*    Estimated Creatinine Clearance: 5.3 mL/min (by C-G formula based on SCr of 7.2 mg/dL (H)).   Medical History: Past Medical History:  Diagnosis Date  . Anemia    BL Hgb 8-9. Likely AOCD in setting of ESRD with iron 48, ferritin  746 (03/2010)  . Anticoagulant long-term use   . Anxiety   . Atrial flutter (HCC)   . Cholelithiases    S/P biliary colic cholecystostomy, but not cholecystectomy secondary to omental adhesions  . ESRD (end stage renal disease) (HCC)    HD for 1 year, HD on Tues, Thurs, Sat // Likely secondary to nephrosclerosis from HTN  . HLD (hyperlipidemia)   . HTN (hypertension)   . Hyperthyroidism DX: 07/2010   Likely Grave's Disease. // On diagnosis, TSH < 0.08, free T4 1.94, free T3 5.3 . //  Thyroid US (07/17/2010) - nonspecific diffuse heterogenous thyroid gland. //  Thyrotropin receptor antibody neg (07/17/2010)  . Non-ST elevation MI (NSTEMI) (HCC)    Noninvasive evaluation with Myoview negative for ischemia.  // 2-D echo  (05/2010) LVEF 65-70%. Grade 1 diastolic dysfunction.  Peak PA pressure 52.  Marland Kitchen. PAF (paroxysmal atrial fibrillation) (HCC)    On chronic coumadin therapy // Followed by Dr. Excell Seltzerooper (cardiology)   Assessment: [age] yof presented to the ED with weakness. She is on chronic coumadin for history of afib. INR today is therapeutic at 2.76. H/H slightly low but platelets are WNL. No bleeding noted.   Goal of Therapy:  INR 2-3 Monitor platelets by anticoagulation protocol: Yes   Plan:  - Resume home warfarin regimen - 2.5mg  daily except for 5mg  on Thursdays - Daily INR  Ailany Koren, Drake Leachachel Lynn 11/30/2015,11:15 AM

## 2015-11-30 NOTE — ED Triage Notes (Signed)
Per [facility]   Pt has felt restless and tired. Denies dizziness and all other complaints. PT called out to EMS and self BP meter showed HR at 144. Pain 0/10.

## 2015-11-30 NOTE — H&P (Signed)
Date: 11/30/2015               Patient Name:  [name] MRN: [id number]  DOB: 12-17-1935 Age / Sex: 80 y.o., female   PCP: Hyacinth Meeker, MD         Medical Service: Internal Medicine Teaching Service         Attending Physician: Dr. Judyann Munson, MD    First Contact: Dr. Ladona Ridgel Pager: [phone]  Second Contact: Dr. Danella Penton  Pager: [phone]       After Hours (After 5p/  First Contact Pager: [phone]  weekends / holidays): Second Contact Pager: [phone]   Chief Complaint: "My heart rate is high and my blood pressure is low"  History of Present Illness: [name] is a 80yo woman with PMHx of ESRD on HD secondary to HTN, atrial fibrillation and atrial flutter on coumadin, and hypothyroidism who presented to the ED with weakness and diaphoresis. Patient explains that she frequently experiences high heart rate and low blood pressure when getting dialysis. She states she is "tired of it" and "wishes someone would figure out why this keeps happening." She reports symptoms of palpitations, dizziness, nausea, weakness, and occasional chest pain with dialysis. Her last dialysis session was on Sat 9/16 and she completed the full session. She reports increased occurrence of these symptoms since her dry weight was changed about 10-11 months ago after she had pneumonia. She denies any missed doses of her medications. She denies any fevers, chills, shortness of breath, or abdominal pain.   In the ED she was noted to initially be in AFib with RVR with HR in the 130s. She was given Metoprolol 5 mg IV once and then became diaphoretic. Repeat EKG showed junctional rhythm changes.   Meds:  Current Meds  Medication Sig  . amiodarone (PACERONE) 200 MG tablet Take 1 tablet (200 mg total) by mouth daily.  . cycloSPORINE (RESTASIS) 0.05 % ophthalmic emulsion Place 1 drop into both eyes 2 (two) times daily.  . Polysaccharide Iron Complex (POLY-IRON 150 PO) Take 150 mg by mouth daily.  . sevelamer  (RENAGEL) 800 MG tablet Take 1,600 mg by mouth 3 (three) times daily with meals.   Marland Kitchen SYNTHROID 150 MCG tablet TAKE ONE TABLET BY MOUTH ONCE DAILY BEFORE  BREAKFAST  . warfarin (COUMADIN) 5 MG tablet Take as directed by coumadin clinic (Patient taking differently: Take 5 mg by mouth daily. )     Allergies: Allergies as of 11/30/2015 - Review Complete 11/30/2015  Allergen Reaction Noted  . Cardizem [diltiazem hcl] Hives 08/03/2010  . Iron  08/03/2010  . Rena-vite [b-plex] Other (See Comments) 10/07/2010  . Codeine Other (See Comments) 04/18/2006  . Pacerone [amiodarone] Other (See Comments) 06/07/2012  . Vancomycin Other (See Comments) 10/07/2010  . Venofer [iron sucrose (iron oxide saccharated)] Itching 08/03/2010  . Latex Itching and Rash 12/05/2010   Past Medical History:  Diagnosis Date  . Anemia    BL Hgb 8-9. Likely AOCD in setting of ESRD with iron 48, ferritin  746 (03/2010)  . Anticoagulant long-term use   . Anxiety   . Atrial flutter (HCC)   . Cholelithiases    S/P biliary colic cholecystostomy, but not cholecystectomy secondary to omental adhesions  . ESRD (end stage renal disease) (HCC)    HD for 1 year, HD on Tues, Thurs, Sat // Likely secondary to nephrosclerosis from HTN  . HLD (hyperlipidemia)   . HTN (hypertension)   . Hyperthyroidism DX: 07/2010  Likely Grave's Disease. // On diagnosis, TSH < 0.08, free T4 1.94, free T3 5.3 . //  Thyroid US (07/17/2010) - nonspecific diffuse heterogenous thyroid gland. //  Thyrotropin receptor antibody neg (07/17/2010)  . Non-ST elevation MI (NSTEMI) (HCC)    Noninvasive evaluation with Myoview negative for ischemia.  // 2-D echo (05/2010) LVEF 65-70%. Grade 1 diastolic dysfunction.  Peak PA pressure 52.  Marland Kitchen. PAF (paroxysmal atrial fibrillation) (HCC)    On chronic coumadin therapy // Followed by Dr. Excell Seltzerooper (cardiology)    Family History: Mother and Father are deceased, unaware of any medical problems.   Social History: Lives at  home with husband. Never smoker. Denies alcohol use. No illicit drug use.   Review of Systems: A complete ROS was negative except as per HPI.   Physical Exam: Blood pressure 130/89, pulse (!) 122, temperature 98.7 F (37.1 C), temperature source Rectal, resp. rate 20, height 5\' 3"  (1.6 m), weight 116 lb 13.5 oz (53 kg), SpO2 92 %. General: elderly woman resting in bed, NAD HEENT: [place]/AT, EOMI, sclera anicteric, mucus membranes moist CV: tachycardic in 120s, rhythm regular, no m/g/r Pulm: CTA bilaterally, breaths non-labored on room air Abd: BS+, soft, non-tender, non-distended Ext: warm, no peripheral edema Neuro: alert and oriented x 3. Strength symmetric and 5/5 in upper and lower extremities.  EKG:  Initial EKG- AFib with RVR, HR 124, prolonged QTc Repeat EKG- Junctional tachycardia, prolonged QTc  CXR: Small left pleural effusion, patient tilted to that side  Labs: K 6.1 Hgb 10.6  INR 2.76   Assessment & Plan by Problem:  New Junctional Rhythm: Patient presented with weakness, diaphoresis, and palpitations found to initially be in AFib with RVR with HR in 130s. She was given 5 mg Metoprolol IV and patient then became more diaphoretic and uncomfortable. Repeat EKG showed a junctional rhythm. Many of her symptoms such as palpitations and low BP can be attributed to dialysis, but her diaphoresis and weakness are likely related to her AFib and this new junctional arrythmia. Cardiology has been consulted by the ED and will follow up their recommendations. - Continue Amiodarone - Continue Warfarin - Cardiology consulted, appreciate recommendations - Cardiac monitoring  - Will get HD today which will decrease K  Hx Paroxysmal AFib/AFlutter: Initially presented with AFib with RVR as noted above. HR in 120s currently, but in junctional tachycardia now.  She follows with Dr. Ladona Ridgelaylor (EP) and Dr. Excell Seltzerooper. She last saw Dr. Ladona Ridgelaylor on 8/29 and he had recommended DCCV, but patient was  unwilling to do so.  - Continue Amiodarone 200 mg daily - Continue Warfarin for anticoagulation - Cardiology consulted, appreciate recommendations   ESRD on HD: On TThSat schedule. [facility] on [address]. Last HD on Sat 9/16, completed full session. Most of the symptoms she experiences are common for dialysis patients, but I think it is reasonable to investigate this new arrhythmia. Will monitor her BP closely while she is on HD.  - Renal consulted for HD today, appreciate help with this case - Continue Sevelamer 1600 mg TID  Anemia of Chronic Disease: Hgb 10.6 on admission, baseline 10.5-11.0. Likely related to ESRD.  - Continue home iron   Hypothyroidism: Last TSH 2.38 and T4 1.25, therapeutic.  - Continue home Synthroid 150 mcg daily    Diet: Heart healthy  DVT PPx: Warfarin  Dispo: Admit patient to Observation with expected length of stay less than 2 midnights.  Signed: Su Hoffarly J Rivet, MD 11/30/2015,  9:30 AM  Pager: [phone]

## 2015-11-30 NOTE — ED Notes (Signed)
Pt ambulated to restroom with 1 stand by assist; tolerated well

## 2015-11-30 NOTE — ED Notes (Signed)
Pt eating lunch and tolerating well. 

## 2015-11-30 NOTE — Progress Notes (Signed)
Patient arrived the unit on a stretcher from the hemodialysis unit, vital signs obtained, placed on tele ccmd notified, bed in lowest position, call light within reach will continue to monitor

## 2015-11-30 NOTE — Consult Note (Signed)
Renal Service Consult Note [facility]  [name] 11/30/2015 [name] D Requesting Physician:  Dr Drue Second  Reason for Consult:  ESRD pt with afib , gen'd weakness HPI: The patient is a 80 y.o. year-old with history of HTN, NSTEMI, ESRD on HD TTS, and aflutter/ afib.  Presented early this am with gen'd weakness, palpitations found to have afib with RVR w HR 130's.  On amio/ coumadin at home. Also has had low BP's on HD recently and dizziness after HD.  EKG in ED showed sinus tach w with afib/ flutter and NSVT.  REc'd IV MTP and HR down to 110's now.  K 6.1.  Trop 0.04 . Asked to see for HD.   Patient w/o complaints at this time.  Chart reviewed back to 2012 and no admissions/ visits for pulm edema or "CHF" from 2012 - now.      ROS  denies CP  no joint pain   no HA  no blurry vision  no rash  no diarrhea  no nausea/ vomiting  no dysuria  no difficulty voiding  no change in urine color    Past Medical History  Past Medical History:  Diagnosis Date  . Anemia    BL Hgb 8-9. Likely AOCD in setting of ESRD with iron 48, ferritin  746 (03/2010)  . Anticoagulant long-term use   . Anxiety   . Atrial flutter (HCC)   . Cholelithiases    S/P biliary colic cholecystostomy, but not cholecystectomy secondary to omental adhesions  . ESRD (end stage renal disease) (HCC)    HD for 1 year, HD on Tues, Thurs, Sat // Likely secondary to nephrosclerosis from HTN  . HLD (hyperlipidemia)   . HTN (hypertension)   . Hyperthyroidism DX: 07/2010   Likely Grave's Disease. // On diagnosis, TSH < 0.08, free T4 1.94, free T3 5.3 . //  Thyroid US (07/17/2010) - nonspecific diffuse heterogenous thyroid gland. //  Thyrotropin receptor antibody neg (07/17/2010)  . Non-ST elevation MI (NSTEMI) (HCC)    Noninvasive evaluation with Myoview negative for ischemia.  // 2-D echo (05/2010) LVEF 65-70%. Grade 1 diastolic dysfunction.  Peak PA pressure 52.  Marland Kitchen PAF (paroxysmal atrial  fibrillation) (HCC)    On chronic coumadin therapy // Followed by Dr. Excell Seltzer (cardiology)   Past Surgical History  Past Surgical History:  Procedure Laterality Date  . BREAST LUMPECTOMY     right  . GALLBLADDER SURGERY    . OTHER SURGICAL HISTORY  02/2009   Cholecystostomy secondary to biliary colic - NO cholecystectomy secondary to omental adhesions - by Dr. Bertram Savin  . temple artery biopsy  12/14/10   Family History  Family History  Problem Relation Age of Onset  . Stroke    . Heart attack     Social History  reports that she has never smoked. She has never used smokeless tobacco. She reports that she does not drink alcohol or use drugs. Allergies  Allergies  Allergen Reactions  . Cardizem [Diltiazem Hcl] Hives  . Iron     ?? Almost died.  . Rena-Vite [B-Plex] Other (See Comments)    Unknown reaction  . Codeine Other (See Comments)    dizziness  . Pacerone [Amiodarone] Other (See Comments)    Visual changes with generic Amiodarone, the pt can only take brand name Pacerone  . Vancomycin Other (See Comments)    dizziness  . Venofer [Iron Sucrose (Iron Oxide Saccharated)] Itching  . Latex Itching and Rash   Home  medications Prior to Admission medications   Medication Sig Start Date End Date Taking? Authorizing Provider  amiodarone (PACERONE) 200 MG tablet Take 1 tablet (200 mg total) by mouth daily. 09/06/15  Yes Marinus MawGregg W Taylor, MD  cycloSPORINE (RESTASIS) 0.05 % ophthalmic emulsion Place 1 drop into both eyes 2 (two) times daily. 07/16/13  Yes Ky BarbanSolianny D Kennerly, MD  Polysaccharide Iron Complex (POLY-IRON 150 PO) Take 150 mg by mouth daily.   Yes Camille Balynthia Dunham, MD  sevelamer (RENAGEL) 800 MG tablet Take 1,600 mg by mouth 3 (three) times daily with meals.    Yes Historical Provider, MD  SYNTHROID 150 MCG tablet TAKE ONE TABLET BY MOUTH ONCE DAILY BEFORE  BREAKFAST 11/01/15  Yes Reather LittlerAjay Kumar, MD  warfarin (COUMADIN) 5 MG tablet Take as directed by coumadin clinic Patient  taking differently: Take 5 mg by mouth daily.  07/28/15  Yes Tonny BollmanMichael Cooper, MD   Liver Function Tests No results for input(s): AST, ALT, ALKPHOS, BILITOT, PROT, ALBUMIN in the last 168 hours. No results for input(s): LIPASE, AMYLASE in the last 168 hours. CBC  Recent Labs Lab 11/30/15 0425  WBC 7.6  HGB 10.6*  HCT 35.1*  MCV 90.0  PLT 264   Basic Metabolic Panel  Recent Labs Lab 11/30/15 0425  NA 138  K 6.1*  CL 98*  CO2 23  GLUCOSE 122*  BUN 50*  CREATININE 7.20*  CALCIUM 9.2   Iron/TIBC/Ferritin/ %Sat    Component Value Date/Time   IRON 36 (L) 04/15/2012 1140   TIBC 214 (L) 04/15/2012 1140   FERRITIN 528 (H) 04/15/2012 1140   IRONPCTSAT 17 (L) 04/15/2012 1140    Vitals:   11/30/15 1420 11/30/15 1426 11/30/15 1500 11/30/15 1530  BP: 128/81 127/79 109/69 (!) 141/66  Pulse: (!) 119 (!) 117 (!) 125 (!) 125  Resp: (!) 26  (!) 22 (!) 21  Temp: 97.4 F (36.3 C)     TempSrc: Oral     SpO2: 94%     Weight: 58.4 kg (128 lb 12 oz)     Height:       Exam Gen alert elderly female, no distrees No rash, cyanosis or gangrene Sclera anicteric, throat clear  No jvd or bruits Chest clear bilat Cor irreg irreg, tachy Abd soft ntnd no mass or ascites +bs GU  defer MS no joint effusions or deformity Ext no LE or UE edema / no wounds or ulcers Neuro is alert, Ox 3 , nf    Dialysis: pending  Assessment: 1. Tachycardia - per cards, hx afib/ flutter 2. Volume - symptoms suggest vol depletion.  Will increase dry wt.  No hx CHF in last 5 yrs here.  3. ESRD HD TTS 4. HTN - no chg meds 5. Afib/ flutter - cont amio / coumadin   Plan - HD today, no fluid off, raise dry wt  Vinson Moselleob Namrata Dangler MD [facility] pager [phone]    cell [phone] 11/30/2015, 4:09 PM

## 2015-11-30 NOTE — ED Notes (Signed)
Pt refuses pacerone stating she has had medication change. Will contact MD.

## 2015-12-01 ENCOUNTER — Encounter: Payer: Self-pay | Admitting: Internal Medicine

## 2015-12-01 DIAGNOSIS — Z992 Dependence on renal dialysis: Secondary | ICD-10-CM | POA: Diagnosis not present

## 2015-12-01 DIAGNOSIS — I498 Other specified cardiac arrhythmias: Secondary | ICD-10-CM | POA: Diagnosis not present

## 2015-12-01 DIAGNOSIS — N186 End stage renal disease: Secondary | ICD-10-CM | POA: Diagnosis not present

## 2015-12-01 DIAGNOSIS — I4891 Unspecified atrial fibrillation: Secondary | ICD-10-CM | POA: Diagnosis not present

## 2015-12-01 DIAGNOSIS — Z7901 Long term (current) use of anticoagulants: Secondary | ICD-10-CM

## 2015-12-01 LAB — BASIC METABOLIC PANEL
Anion gap: 12 (ref 5–15)
BUN: 21 mg/dL — ABNORMAL HIGH (ref 6–20)
CO2: 27 mmol/L (ref 22–32)
Calcium: 8.9 mg/dL (ref 8.9–10.3)
Chloride: 97 mmol/L — ABNORMAL LOW (ref 101–111)
Creatinine, Ser: 4.44 mg/dL — ABNORMAL HIGH (ref 0.44–1.00)
GFR calc Af Amer: 10 mL/min — ABNORMAL LOW (ref >60)
GFR calc non Af Amer: 9 mL/min — ABNORMAL LOW (ref >60)
Glucose, Bld: 115 mg/dL — ABNORMAL HIGH (ref 65–99)
Potassium: 4.2 mmol/L (ref 3.5–5.1)
Sodium: 136 mmol/L (ref 135–145)

## 2015-12-01 LAB — PROTIME-INR
INR: 2.21
Prothrombin Time: 24.9 seconds — ABNORMAL HIGH (ref 11.4–15.2)

## 2015-12-01 MED ORDER — METOPROLOL TARTRATE 25 MG PO TABS: 12.5000 mg | ORAL_TABLET | Freq: Two times a day (BID) | ORAL | 3 refills | Status: AC

## 2015-12-01 MED ORDER — METOPROLOL TARTRATE 12.5 MG HALF TABLET
12.5000 mg | ORAL_TABLET | Freq: Two times a day (BID) | ORAL | Status: DC
  Administered 2015-12-01: 12.5 mg via ORAL
  Filled 2015-12-01: qty 1

## 2015-12-01 NOTE — Discharge Summary (Signed)
Name: Bellah Alia MRN: [id number] DOB: 11/04/1935 80 y.o. PCP: Hyacinth Meeker, MD  Date of Admission: 11/30/2015  4:10 AM Date of Discharge: 12/01/2015 Attending Physician: Judyann Munson, MD  Discharge Diagnosis: 1. Junctional rhythm 2. Atrial fibrillation 3. End-stage renal disease on hemodialysis  Discharge Medications:   Medication List    TAKE these medications   amiodarone 200 MG tablet Commonly known as:  PACERONE Take 1 tablet (200 mg total) by mouth daily.   cycloSPORINE 0.05 % ophthalmic emulsion Commonly known as:  RESTASIS Place 1 drop into both eyes 2 (two) times daily.   metoprolol tartrate 25 MG tablet Commonly known as:  LOPRESSOR Take 0.5 tablets (12.5 mg total) by mouth 2 (two) times daily.   POLY-IRON 150 PO Take 150 mg by mouth daily.   sevelamer 800 MG tablet Commonly known as:  RENAGEL Take 1,600 mg by mouth 3 (three) times daily with meals.   SYNTHROID 150 MCG tablet Generic drug:  levothyroxine TAKE ONE TABLET BY MOUTH ONCE DAILY BEFORE  BREAKFAST   warfarin 5 MG tablet Commonly known as:  COUMADIN Take as directed by coumadin clinic What changed:  how much to take  how to take this  when to take this  additional instructions       Disposition and follow-up:   Ms.Deserae Verno was discharged from [facility] in Good condition.  At the hospital follow up visit please address:  1.  Please ensure the patient is taking her metoprolol 12.5 mg twice daily.  2.  Labs / imaging needed at time of follow-up: None  3.  Pending labs/ test needing follow-up: None  Follow-up Appointments: Follow-up Information    Lewayne Bunting, MD Follow up on 12/15/2015.   Specialty:  Cardiology Why:  at 2:30PM Contact information: [address] [place] [address] [phone]        [facility] Follow up on 12/08/2015.   Why:  at 10:15am.  Contact information: [address] [place] [address] [phone]          Hospital Course by problem list: Principal Problem:   EKG abnormalities Active Problems:   Hyperlipidemia   Anemia of chronic disease   HTN (hypertension)   Atrial fibrillation with RVR (HCC)   End stage renal disease on dialysis (HCC)   Hyperkalemia   1. Junctional rhythm The patient presented to [facility] department on 11/30/2015 with a chief complaint of weakness and diaphoresis. The patient stated that she experienced a high heart rate and low blood pressures when getting dialysis. She stated that she was tired of dialysis and thinks that her dry weight is to low because it makes her feel dizzy and nauseous. In the emergency department she was noted to initially be in atrial fibrillation with rapid ventricular response with heart rates in the 130s. She was given metoprolol 5 mg IV once and became diaphoretic. Repeat EKG showed junctional rhythm changes. She was evaluated by cardiology and was recommended that she start 12.5 mg metoprolol twice a day. She will also follow-up with cardiology in the outpatient setting. During the course of her hospitalization her weakness and diaphoresis improved. She received dialysis yesterday on her normal schedule without complication. This morning she was tolerating good by mouth intake and felt ready to go home. She was able to ambulate without any issues. At the time of discharge she denied headache, shortness of breath, chest pain, nausea, vomiting  or abdominal pain. She'll be continued on her amiodarone 200 mg once daily, warfarin and will now start metoprolol 12.5 mg twice daily. She'll follow-up with cardiology.  2. Atrial fibrillation The patient has a history of atrial fibrillation. She was seen by cardiology while in the inpatient setting and it was recommended that she start metoprolol 12.5 mg twice daily. She'll be discharged on her current dose of warfarin, amiodarone 200  mg once daily and now metoprolol 12.5 mg twice daily.  3. End-stage renal disease on hemodialysis The patient had hemodialysis on 11/30/2015 which is her normal schedule  without complication. Some of her nausea and weakness may be secondary to dialysis. Her nephrologist has recommended that her dry weight be increased by approximately 3.5 kg. This increase in her dry weight should improve some of her chronic symptoms that she experiences following dialysis sessions. She will continue with her current hemodialysis schedule with a plan to increase her dry weight prior approximately 3.5 kg following her nephrologist recommendation.  Discharge Vitals:   BP (!) 144/50 (BP Location: Left Arm)   Pulse 61   Temp 97.4 F (36.3 C) (Oral)   Resp 18   Ht 5\' 3"  (1.6 m)   Wt 124 lb 12.5 oz (56.6 kg) Comment: standing weight  SpO2 90%   BMI 22.10 kg/m   Pertinent Labs, Studies, and Procedures:  1. Chest x-ray demonstrating enlargement. Small pleural effusion with basilar atelectasis.  Discharge Instructions: Discharge Instructions    Diet - low sodium heart healthy    Complete by:  As directed    Discharge instructions    Complete by:  As directed    Continue your scheduled dialysis sessions. Continue your current medications. We have started you on a new medication called metoprolol. You're to take 12.5 mg of this medication twice daily.   Increase activity slowly    Complete by:  As directed       Signed: Thomasene LotJames Oak Dorey, MD 12/01/2015, 5:03 PM   Pager: [phone]

## 2015-12-01 NOTE — Progress Notes (Signed)
  [facility] Progress Note   Subjective: feels ok today, had no problems w HD last night. She did cramp w HD last night, a little bit.    Vitals:   11/30/15 1742 11/30/15 1849 11/30/15 1942 12/01/15 0454  BP: (!) 139/48 (!) 138/52 (!) 127/42 (!) 141/72  Pulse: 62 63 60 63  Resp: 20 16 20 20   Temp: 98 F (36.7 C) 97.9 F (36.6 C) 97.6 F (36.4 C) 98.1 F (36.7 C)  TempSrc: Oral Oral Oral Oral  SpO2: 93% 99% 95% 93%  Weight: 56.6 kg (124 lb 12.5 oz)     Height:        Inpatient medications: . amiodarone  200 mg Oral Daily  . cycloSPORINE  1 drop Both Eyes BID  . iron polysaccharides  150 mg Oral Daily  . levothyroxine  150 mcg Oral QAC breakfast  . sevelamer carbonate  1,600 mg Oral TID WC  . sodium chloride flush  3 mL Intravenous Q12H  . warfarin  2.5 mg Oral Once per day on Sun Mon Tue Wed Fri Sat  . [START ON 12/02/2015] warfarin  5 mg Oral Q Thu-1800  . Warfarin - Pharmacist Dosing Inpatient   Does not apply q1800     senna-docusate  Exam: Gen alert elderly female, no distrees No rash, cyanosis or gangrene Sclera anicteric, throat clear and slightly dry, poor skin turgor No jvd or bruits Chest clear bilat Cor reg. Slow, no MRG Abd soft ntnd no mass or ascites +bs MS no joint effusions or deformity Ext no LE or UE edema / no wounds or ulcers Neuro is alert, Ox 3 , nf  HD: NW TTS  4h  54kg  2/2 bath  Hep 1400   Assessment: 1. Tachycardia - per cards, sinus tach w some afib/ NSVT. 2. Volume - symptoms and exam may suggest vol depletion.  Will increase dry wt to 57.5kg.  CXR's and DC summaries reviewed since 2012, no CHF in last 5 yrs.    3. ESRD HD TTS 4. HTN - no chg meds 5. Afib/ flutter - cont amio / coumadin  Plan - we are raising dry wt, see if this helps with pt's hypotension issues on HD.  Hypotension may be triggering afib episodes as well.     Vinson Moselleob Antoninette Lerner MD [facility] pager [phone]    cell  [phone] 12/01/2015, 9:04 AM    Recent Labs Lab 11/30/15 0425 12/01/15 0343  NA 138 136  K 6.1* 4.2  CL 98* 97*  CO2 23 27  GLUCOSE 122* 115*  BUN 50* 21*  CREATININE 7.20* 4.44*  CALCIUM 9.2 8.9   No results for input(s): AST, ALT, ALKPHOS, BILITOT, PROT, ALBUMIN in the last 168 hours.  Recent Labs Lab 11/30/15 0425  WBC 7.6  HGB 10.6*  HCT 35.1*  MCV 90.0  PLT 264   Iron/TIBC/Ferritin/ %Sat    Component Value Date/Time   IRON 36 (L) 04/15/2012 1140   TIBC 214 (L) 04/15/2012 1140   FERRITIN 528 (H) 04/15/2012 1140   IRONPCTSAT 17 (L) 04/15/2012 1140

## 2015-12-01 NOTE — Progress Notes (Signed)
Patient in a stable condition, discahrge education completed by this patient with patient and husband at bedside, they verbalised understanding, iv removed, tele dc ccmd notified, patient belongings at bedside, patient taken off the unit on a wheelchair by a NT

## 2015-12-01 NOTE — Progress Notes (Signed)
   Subjective: No acute events overnight. Patient had dialysis yesterday. She states that she is feeling better and is ready to go home this afternoon. She denies headache, shortness of breath, chest pain, nausea, vomiting or abdominal pain. She has no additional acute complaints or concerns this morning.  Objective:  Vital signs in last 24 hours: Vitals:   11/30/15 1742 11/30/15 1849 11/30/15 1942 12/01/15 0454  BP: (!) 139/48 (!) 138/52 (!) 127/42 (!) 141/72  Pulse: 62 63 60 63  Resp: 20 16 20 20   Temp: 98 F (36.7 C) 97.9 F (36.6 C) 97.6 F (36.4 C) 98.1 F (36.7 C)  TempSrc: Oral Oral Oral Oral  SpO2: 93% 99% 95% 93%  Weight: 124 lb 12.5 oz (56.6 kg)     Height:       Physical Exam  Constitutional: She is oriented to person, place, and time. She appears well-developed and well-nourished.  HENT:  Head: Normocephalic and atraumatic.  Cardiovascular: Normal rate and regular rhythm.  Exam reveals no gallop and no friction rub.   No murmur heard. Respiratory: Effort normal and breath sounds normal. No respiratory distress. She has no wheezes.  GI: Soft. Bowel sounds are normal. She exhibits no distension. There is no tenderness.  Musculoskeletal: Normal range of motion. She exhibits no edema.  Neurological: She is oriented to person, place, and time.     Assessment/Plan: Patient is 80 year old female with a history of end-stage renal disease on hemodialysis, hypertension, coronary artery disease and atrial fibrillation who presented with a several day history of weakness found to be in atrial fibrillation with RVR and given metoprolol in the emergency department causing her to develop a junctional tachycardia.  1. Junctional rhythm Evaluated by cardiology. She is currently with regular rate and rhythm on examination. Patient has a known history of atrial arrhythmias including A. fib and atypical-appearing atrial flutter. She has declined ablation in the past. -- Continue  amiodarone 200 mg -- Continue warfarin -- Start metoprolol 12.5 mg twice a day -- Follow-up with cardiology  2. Atrial fibrillation -- Continue warfarin -- Continue amiodarone 200 mg -- Start metoprolol 12.5 mg twice daily  3. End-stage renal disease on hemodialysis Patient had dialysis yesterday evening without complication. Some of her symptoms may be secondary to dialysis. Her nephrologist has recommended that her dry weight be increased by approximately 3.5 kg -- Continue current hemodialysis schedule -- Increase dry weight per nephrology recommendations  4. Hypothyroidism -- Continue home Synthroid 150 mcg daily  Dispo: Anticipated discharge today.   Thomasene LotJames Dawnya Grams, MD 12/01/2015, 11:41 AM Pager: [phone]

## 2015-12-01 NOTE — Progress Notes (Signed)
ANTICOAGULATION CONSULT NOTE - Initial Consult  Pharmacy Consult for warfarin Indication: atrial fibrillation  Allergies  Allergen Reactions  . Cardizem [Diltiazem Hcl] Hives  . Iron     ?? Almost died.  . Rena-Vite [B-Plex] Other (See Comments)    Unknown reaction  . Codeine Other (See Comments)    dizziness  . Pacerone [Amiodarone] Other (See Comments)    Visual changes with generic Amiodarone, the pt can only take brand name Pacerone  . Vancomycin Other (See Comments)    dizziness  . Venofer [Iron Sucrose (Iron Oxide Saccharated)] Itching  . Latex Itching and Rash    Patient Measurements: Height: 5\' 3"  (160 cm) Weight: 124 lb 12.5 oz (56.6 kg) (standing weight) IBW/kg (Calculated) : 52.4  Vital Signs: Temp: 98.1 F (36.7 C) (09/20 0454) Temp Source: Oral (09/20 0454) BP: 141/72 (09/20 0454) Pulse Rate: 63 (09/20 0454)  Labs:  Recent Labs  11/30/15 0425 12/01/15 0343  HGB 10.6*  --   HCT 35.1*  --   PLT 264  --   LABPROT 29.7* 24.9*  INR 2.76 2.21  CREATININE 7.20* 4.44*    Estimated Creatinine Clearance: 8.6 mL/min (by C-G formula based on SCr of 4.44 mg/dL (H)).   Medical History: Past Medical History:  Diagnosis Date  . Anemia    BL Hgb 8-9. Likely AOCD in setting of ESRD with iron 48, ferritin  746 (03/2010)  . Anticoagulant long-term use   . Anxiety   . Atrial flutter (HCC)   . Cholelithiases    S/P biliary colic cholecystostomy, but not cholecystectomy secondary to omental adhesions  . ESRD (end stage renal disease) (HCC)    HD for 1 year, HD on Tues, Thurs, Sat // Likely secondary to nephrosclerosis from HTN  . HLD (hyperlipidemia)   . HTN (hypertension)   . Hyperthyroidism DX: 07/2010   Likely Grave's Disease. // On diagnosis, TSH < 0.08, free T4 1.94, free T3 5.3 . //  Thyroid US (07/17/2010) - nonspecific diffuse heterogenous thyroid gland. //  Thyrotropin receptor antibody neg (07/17/2010)  . Non-ST elevation MI (NSTEMI) (HCC)    Noninvasive evaluation with Myoview negative for ischemia.  // 2-D echo (05/2010) LVEF 65-70%. Grade 1 diastolic dysfunction.  Peak PA pressure 52.  [name]. PAF (paroxysmal atrial fibrillation) (HCC)    On chronic coumadin therapy // Followed by Dr. Excell Seltzerooper (cardiology)   Assessment: [age] yof presented to the ED with weakness. She is on chronic coumadin for history of afib. INR today remains therapeutic, decreased 2.76>2.21. H/H slightly low but platelets are WNL. No bleeding noted.   Goal of Therapy:  INR 2-3 Monitor platelets by anticoagulation protocol: Yes   Plan:  - Resume home warfarin regimen - 2.5mg  daily except for 5mg  on Thursdays - Daily INR - Monitor CBC, for s/sx bleeding  Babs BertinHaley Tasmine Hipwell, PharmD, BCPS Clinical Pharmacist Pager [phone] 12/01/2015 10:52 AM

## 2015-12-01 NOTE — Discharge Instructions (Signed)
Start taking metoprolol 12.5 mg ( half a tablet) twice a day.

## 2015-12-08 ENCOUNTER — Ambulatory Visit (INDEPENDENT_AMBULATORY_CARE_PROVIDER_SITE_OTHER): Payer: Medicaid Other | Admitting: Internal Medicine

## 2015-12-08 VITALS — BP 170/74 | HR 103 | Temp 98.3°F | Ht 63.0 in | Wt 130.9 lb

## 2015-12-08 DIAGNOSIS — N186 End stage renal disease: Secondary | ICD-10-CM | POA: Diagnosis not present

## 2015-12-08 DIAGNOSIS — Z9181 History of falling: Secondary | ICD-10-CM

## 2015-12-08 DIAGNOSIS — I4891 Unspecified atrial fibrillation: Secondary | ICD-10-CM | POA: Diagnosis not present

## 2015-12-08 DIAGNOSIS — M542 Cervicalgia: Secondary | ICD-10-CM | POA: Diagnosis not present

## 2015-12-08 DIAGNOSIS — I132 Hypertensive heart and chronic kidney disease with heart failure and with stage 5 chronic kidney disease, or end stage renal disease: Secondary | ICD-10-CM | POA: Diagnosis not present

## 2015-12-08 DIAGNOSIS — Z7901 Long term (current) use of anticoagulants: Secondary | ICD-10-CM | POA: Diagnosis not present

## 2015-12-08 DIAGNOSIS — Z79899 Other long term (current) drug therapy: Secondary | ICD-10-CM

## 2015-12-08 DIAGNOSIS — I5032 Chronic diastolic (congestive) heart failure: Secondary | ICD-10-CM

## 2015-12-08 DIAGNOSIS — Z992 Dependence on renal dialysis: Secondary | ICD-10-CM | POA: Diagnosis not present

## 2015-12-08 NOTE — Assessment & Plan Note (Signed)
She was still tachycardic with regular rhythm today. Her heart rate was 103. She was not having any symptoms. She had an upcoming appointment with cardiology next week. She is on amiodarone 200 mg daily and metoprolol 12.5 mg twice a day for rate control. She is on Coumadin and follow-up at Coumadin clinic regularly.  We will let cardiology manage her A. Fib.

## 2015-12-08 NOTE — Assessment & Plan Note (Signed)
She was having bilateral basal crackles on chest auscultation today. She was also having 1+ pedal edema. She has an increase in her weight from 124 pounds to 130 pounds in 6 days. Her oxygen saturation was 89% at room air. She do not want oxygen supplement, stating that she is competent table and not having any shortness of breath. She said she is compliant with her dialysis. She was going again tomorrow for her hemodialysis.  Reassess during next follow-up visit.

## 2015-12-08 NOTE — Progress Notes (Signed)
   CC: Follow-up after discharge from hospital due to A. fib and hypertension.  HPI:  [name] is a 80 y.o. with past medical history as listed below, came to the clinic for her hospital follow-up. She was recently admitted to the hospital on September 19 because of palpitations and hypotension after hemodialysis. She was found to be in A. fib with rapid ventricular response with heart rate in 130es. She was on midodrine on 200 mg once daily, and metoprolol 12.5 mg twice daily was added during her current stay. She is following up with cardiology her next appointment is next week. She complains of pain in her back of neck, since she fell from her bed about 1-1/2 months ago. She denies any tingling, numbness or weakness in her upper arm. She states that pain increased with neck extension. She also complained of generalized itching which become more pronounced after dialysis. She denies any rash. She states that it might be due to overdrying after dialysis..   Past Medical History:  Diagnosis Date  . Anemia    BL Hgb 8-9. Likely AOCD in setting of ESRD with iron 48, ferritin  746 (03/2010)  . Anticoagulant long-term use   . Anxiety   . Atrial flutter (HCC)   . Cholelithiases    S/P biliary colic cholecystostomy, but not cholecystectomy secondary to omental adhesions  . ESRD (end stage renal disease) (HCC)    HD for 1 year, HD on Tues, Thurs, Sat // Likely secondary to nephrosclerosis from HTN  . HLD (hyperlipidemia)   . HTN (hypertension)   . Hyperthyroidism DX: 07/2010   Likely Grave's Disease. // On diagnosis, TSH < 0.08, free T4 1.94, free T3 5.3 . //  Thyroid US (07/17/2010) - nonspecific diffuse heterogenous thyroid gland. //  Thyrotropin receptor antibody neg (07/17/2010)  . Non-ST elevation MI (NSTEMI) (HCC)    Noninvasive evaluation with Myoview negative for ischemia.  // 2-D echo (05/2010) LVEF 65-70%. Grade 1 diastolic dysfunction.  Peak PA pressure 52.  Marland Kitchen. PAF (paroxysmal  atrial fibrillation) (HCC)    On chronic coumadin therapy // Followed by Dr. Excell Seltzerooper (cardiology)    Review of Systems:  As per HPI.  Physical Exam:  Vitals:   12/08/15 1030  BP: (!) 170/74  Pulse: (!) 103  Temp: 98.3 F (36.8 C)  TempSrc: Oral  SpO2: (!) 89%  Weight: 130 lb 14.4 oz (59.4 kg)  Height: 5\' 3"  (1.6 m)   Gen. well-built, well-nourished, in no acute distress. Musculoskeletal. No edema, no erythema along cervical spine. There was mild tenderness at paracervical region more on left. Normal range of motion. Strength and sensations intact and normal. Skin. Warm, mildly dry. No excoriation marks. Dialysis fistula intact at left upper extremity. Lungs. Bilateral basal crackles more on the left as compared to right. CV. Tachycardia with irregular rhythm. Abdomen. Mildly tense, nontender,Bowel sounds present. Extremities. 1+ pedal edema bilaterally. Pulses 2+ bilaterally.  Assessment & Plan:   See Encounters Tab for problem based charting.  Patient seen with Dr. Cleda DaubE. Hoffman.

## 2015-12-08 NOTE — Assessment & Plan Note (Signed)
She is on hemodialysis 3 times a week Tuesday Thursday and Saturday. She said she is being compliant with her routine. Complains  of dry skin and itchiness after dialysis.  Continue with the dialysis regimen and follow-up with nephrology.

## 2015-12-08 NOTE — Assessment & Plan Note (Signed)
BP Readings from Last 3 Encounters:  12/08/15 (!) 170/74  12/01/15 (!) 144/50  11/09/15 (!) 156/80   Her blood pressure was high today. She states that she never took her medicines this morning, as she take them with lunch as a routine due to her morning dialysis routine.  Continue with the current management. Reassess again during next follow-up visit.

## 2015-12-08 NOTE — Patient Instructions (Signed)
Thank you for visiting clinic today. You can try some heating pad and over-the-counter rubbing stuff for your neck pain. We'll try to avoid muscle relaxant at this time. You can take some Tylenol 500 mg 3 times a day if needed for pain. Please keep up your appointment with your cardiologist as your heart rate is still high.

## 2015-12-08 NOTE — Assessment & Plan Note (Signed)
She complains of pain in her back of neck, since she fell from her bed about 1-1/2 months ago. She denies any tingling, numbness or weakness in her upper arm. She states that pain increased with neck extension. She had no history of frequent falls.  On exam she was having mild tenderness along the paracervical region more on the left. Rest of the exam was normal.  Her neck pain looks more musculoskeletal in the origin. Most probably after her fall. We advised her to use some heating pad, she can use some muscle rub cream from over-the-counter. Can use Tylenol if needed. We can consider physical therapy if these measures were unable to control her symptoms.

## 2015-12-09 NOTE — Progress Notes (Signed)
Internal Medicine Clinic Attending  I saw and evaluated the patient.  I personally confirmed the key portions of the history and exam documented by Dr. Amin and I reviewed pertinent patient test results.  The assessment, diagnosis, and plan were formulated together and I agree with the documentation in the resident's note. 

## 2015-12-15 ENCOUNTER — Ambulatory Visit (INDEPENDENT_AMBULATORY_CARE_PROVIDER_SITE_OTHER): Payer: Medicaid Other | Admitting: Internal Medicine

## 2015-12-15 ENCOUNTER — Encounter: Payer: Self-pay | Admitting: Internal Medicine

## 2015-12-15 ENCOUNTER — Ambulatory Visit (INDEPENDENT_AMBULATORY_CARE_PROVIDER_SITE_OTHER): Payer: Medicaid Other | Admitting: *Deleted

## 2015-12-15 VITALS — BP 152/78 | HR 80 | Ht 63.0 in | Wt 135.0 lb

## 2015-12-15 DIAGNOSIS — I4891 Unspecified atrial fibrillation: Secondary | ICD-10-CM

## 2015-12-15 DIAGNOSIS — Z5181 Encounter for therapeutic drug level monitoring: Secondary | ICD-10-CM

## 2015-12-15 LAB — POCT INR: INR: 2.3

## 2015-12-15 NOTE — Patient Instructions (Addendum)
Medication Instructions:  Your physician recommends that you continue on your current medications as directed. Please refer to the Current Medication list given to you today.   Labwork: None Ordered   Testing/Procedures: Cardioversion     Follow-Up: Your physician wants you to follow-up in: 6 months with Dr. Ladona Ridgelaylor. You will receive a reminder letter in the mail two months in advance. If you don't receive a letter, please call our office to schedule the follow-up appointment.   Any Other Special Instructions Will Be Listed Below (If Applicable). Please call our office to schedule cardioversion    If you need a refill on your cardiac medications before your next appointment, please call your pharmacy.

## 2015-12-15 NOTE — Progress Notes (Signed)
HPI [name] returns today for followup. She is a pleasant 80 year old woman with a history of hypertension, symptomatic tachybradycardia syndrome, end-stage renal disease now on hemodialysis. The patient had been on amiodarone for over 6 years. Her amio had been stopped but was restarted. She c/o being dizzy when she finishes a dialysis session although I do not think she syncope. She has dyspnea with exertion and malaise. She has to stop when she walks because of shortness of breath. Minimal peripheral edema. When I saw the patient last a proximally 6 weeks ago, she was in atrial flutter, I recommended cardioversion but she refused. Allergies  Allergen Reactions  . Cardizem [Diltiazem Hcl] Hives  . Iron     ?? Almost died.  . Rena-Vite [B-Plex] Other (See Comments)    Unknown reaction  . Codeine Other (See Comments)    dizziness  . Pacerone [Amiodarone] Other (See Comments)    Visual changes with generic Amiodarone, the pt can only take brand name Pacerone  . Vancomycin Other (See Comments)    dizziness  . Venofer [Iron Sucrose (Iron Oxide Saccharated)] Itching  . Latex Itching and Rash     Current Outpatient Prescriptions  Medication Sig Dispense Refill  . amiodarone (PACERONE) 200 MG tablet Take 1 tablet (200 mg total) by mouth daily. 90 tablet 3  . cycloSPORINE (RESTASIS) 0.05 % ophthalmic emulsion Place 1 drop into both eyes 2 (two) times daily. 0.4 mL 0  . metoprolol tartrate (LOPRESSOR) 25 MG tablet Take 0.5 tablets (12.5 mg total) by mouth 2 (two) times daily. 30 tablet 3  . Polysaccharide Iron Complex (POLY-IRON 150 PO) Take 150 mg by mouth daily.    . sevelamer (RENAGEL) 800 MG tablet Take 1,600 mg by mouth 3 (three) times daily with meals.     Marland Kitchen SYNTHROID 150 MCG tablet TAKE ONE TABLET BY MOUTH ONCE DAILY BEFORE  BREAKFAST 30 tablet 3  . warfarin (COUMADIN) 5 MG tablet Take as directed by coumadin clinic (Patient taking differently: Take 5 mg by mouth daily. ) 60 tablet  0   No current facility-administered medications for this visit.      Past Medical History:  Diagnosis Date  . Anemia    BL Hgb 8-9. Likely AOCD in setting of ESRD with iron 48, ferritin  746 (03/2010)  . Anticoagulant long-term use   . Anxiety   . Atrial flutter (HCC)   . Cholelithiases    S/P biliary colic cholecystostomy, but not cholecystectomy secondary to omental adhesions  . ESRD (end stage renal disease) (HCC)    HD for 1 year, HD on Tues, Thurs, Sat // Likely secondary to nephrosclerosis from HTN  . HLD (hyperlipidemia)   . HTN (hypertension)   . Hyperthyroidism DX: 07/2010   Likely Grave's Disease. // On diagnosis, TSH < 0.08, free T4 1.94, free T3 5.3 . //  Thyroid US (07/17/2010) - nonspecific diffuse heterogenous thyroid gland. //  Thyrotropin receptor antibody neg (07/17/2010)  . Non-ST elevation MI (NSTEMI) (HCC)    Noninvasive evaluation with Myoview negative for ischemia.  // 2-D echo (05/2010) LVEF 65-70%. Grade 1 diastolic dysfunction.  Peak PA pressure 52.  Marland Kitchen PAF (paroxysmal atrial fibrillation) (HCC)    On chronic coumadin therapy // Followed by Dr. Excell Seltzer (cardiology)    ROS:   All systems reviewed and negative except as noted in the HPI.   Past Surgical History:  Procedure Laterality Date  . BREAST LUMPECTOMY     right  . GALLBLADDER SURGERY    .  OTHER SURGICAL HISTORY  02/2009   Cholecystostomy secondary to biliary colic - NO cholecystectomy secondary to omental adhesions - by Dr. Bertram SavinAmber Allen  . temple artery biopsy  12/14/10     Family History  Problem Relation Age of Onset  . Stroke    . Heart attack       Social History   Social History  . Marital status: Married    Spouse name: N/A  . Number of children: 0  . Years of education: 1312 th grad   Occupational History  .  Retired   Social History Main Topics  . Smoking status: Never Smoker  . Smokeless tobacco: Never Used  . Alcohol use No  . Drug use: No  . Sexual activity: Not on  file   Other Topics Concern  . Not on file   Social History Narrative   [place] in [place]   Housewife.  [place]     BP (!) 152/78   Pulse 80   Ht 5\' 3"  (1.6 m)   Wt 135 lb (61.2 kg)   BMI 23.91 kg/m   Physical Exam:  Stable appearing 80 yo woman, NAD HEENT: Unremarkable Neck:  7 cm JVD, no thyromegally Lymphatics:  No adenopathy Back:   CVA tenderness Lungs:  Clear, no wheezes, rales, or rhonchi. HEART:  Regular rate rhythm, no murmurs, no rubs, no clicks Abd:  soft, positive bowel sounds, no organomegally, no rebound, no guarding Ext:  2 plus pulses, no edema, no cyanosis, no clubbing Skin:  No rashes no nodules Neuro:  CN II through XII intact, motor grossly intact  EKG Atypical Atrial flutter with variable AV conduction, typically 2-1  Assess/Plan: 1. Atrial fib - no evidence of recurrent fibrillation on amiodarone  2. Atrial flutter - her ECG's demonstrated atrial flutter with a variable AV conduction. I have recommended we proceed with DCCV. She is reflecting, and will call us if she wishes to proceed. 3. HTN - her blood pressure has been reasonably well controlled although at times it is elevated. It sounds like she is going low at the end of HD. She is encouraged to discuss this with her primary MD.  Leonia ReevesGregg Tyleigh Mahn,M.D.

## 2016-01-10 ENCOUNTER — Ambulatory Visit (HOSPITAL_COMMUNITY)
Admission: RE | Admit: 2016-01-10 | Discharge: 2016-01-10 | Disposition: A | Discharge status: Home / Self Care | Payer: Medicaid Other | Source: Ambulatory Visit | Attending: Internal Medicine | Admitting: Internal Medicine

## 2016-01-10 ENCOUNTER — Other Ambulatory Visit: Payer: Self-pay | Admitting: Internal Medicine

## 2016-01-10 ENCOUNTER — Ambulatory Visit
Admission: RE | Admit: 2016-01-10 | Discharge: 2016-01-10 | Disposition: A | Discharge status: Home / Self Care | Payer: Medicaid Other | Source: Ambulatory Visit | Attending: Internal Medicine | Admitting: Internal Medicine

## 2016-01-10 ENCOUNTER — Encounter: Payer: Self-pay | Admitting: Internal Medicine

## 2016-01-10 ENCOUNTER — Ambulatory Visit (INDEPENDENT_AMBULATORY_CARE_PROVIDER_SITE_OTHER): Payer: Medicaid Other | Admitting: Internal Medicine

## 2016-01-10 VITALS — BP 157/50 | HR 56 | Temp 97.5°F | Wt 131.7 lb

## 2016-01-10 DIAGNOSIS — M47892 Other spondylosis, cervical region: Secondary | ICD-10-CM | POA: Insufficient documentation

## 2016-01-10 DIAGNOSIS — I6522 Occlusion and stenosis of left carotid artery: Secondary | ICD-10-CM | POA: Insufficient documentation

## 2016-01-10 DIAGNOSIS — M542 Cervicalgia: Secondary | ICD-10-CM

## 2016-01-10 DIAGNOSIS — M4312 Spondylolisthesis, cervical region: Secondary | ICD-10-CM | POA: Insufficient documentation

## 2016-01-10 DIAGNOSIS — E538 Deficiency of other specified B group vitamins: Secondary | ICD-10-CM

## 2016-01-10 DIAGNOSIS — I700 Atherosclerosis of aorta: Secondary | ICD-10-CM | POA: Insufficient documentation

## 2016-01-10 NOTE — Patient Instructions (Signed)
Will get xray of your Cervical spine to rule out fractures.  Will get vitamin b12 checked.  Follow up in 3 months.

## 2016-01-10 NOTE — Assessment & Plan Note (Signed)
Will recheck vitamin b12 and MMA today. If low, will bring her in for B12 shots. She states her energy was better after a vitamin b12 shot in dialysis 2 weeks ago (no labs to show deficiency)

## 2016-01-10 NOTE — Assessment & Plan Note (Signed)
Neck pain likely from MSK inflammation from the fall 3 months ago. No spine tenderness. Good ROM.  Will get xray cspine to rule out fracture.  Recommended heating pad use more frequently and tylenol.

## 2016-01-10 NOTE — Progress Notes (Signed)
   CC: neck pain, low energy.  HPI:  [name] is a 80 y.o. is PMH as listed below is here for neck pain and low energy.   Past Medical History:  Diagnosis Date  . Anemia    BL Hgb 8-9. Likely AOCD in setting of ESRD with iron 48, ferritin  746 (03/2010)  . Anticoagulant long-term use   . Anxiety   . Atrial flutter (HCC)   . Cholelithiases    S/P biliary colic cholecystostomy, but not cholecystectomy secondary to omental adhesions  . ESRD (end stage renal disease) (HCC)    HD for 1 year, HD on Tues, Thurs, Sat // Likely secondary to nephrosclerosis from HTN  . HLD (hyperlipidemia)   . HTN (hypertension)   . Hyperthyroidism DX: 07/2010   Likely Grave's Disease. // On diagnosis, TSH < 0.08, free T4 1.94, free T3 5.3 . //  Thyroid US (07/17/2010) - nonspecific diffuse heterogenous thyroid gland. //  Thyrotropin receptor antibody neg (07/17/2010)  . Non-ST elevation MI (NSTEMI) (HCC)    Noninvasive evaluation with Myoview negative for ischemia.  // 2-D echo (05/2010) LVEF 65-70%. Grade 1 diastolic dysfunction.  Peak PA pressure 52.  Marland Kitchen. PAF (paroxysmal atrial fibrillation) (HCC)    On chronic coumadin therapy // Followed by Dr. Excell Seltzerooper (cardiology)    Vitamin b12 def - had low vitamin b12, got b12 shot in dialysis 2 weeks ago. Felt better after the shot. B12 level was not checked in dialysis. Last B12 in chart is from 2015 (388). Will recheck today.  Neck pain - neck pain since falling 3 months ago, mainly on the muscle, hurts to move neck, no numbness/tingling. No imaging done. Tried heating pad few times, has not tried any tylenol as suggested in the past.   Review of Systems  Constitutional: Positive for malaise/fatigue. Negative for chills and fever.  HENT: Negative for hearing loss.   Cardiovascular: Negative for chest pain.  Gastrointestinal: Negative for nausea and vomiting.  Musculoskeletal: Positive for neck pain.  Neurological: Negative for headaches.      Physical Exam:  Vitals:   01/10/16 1408  BP: (!) 157/50  Pulse: (!) 56  Temp: 97.5 F (36.4 C)  TempSrc: Oral  SpO2: 100%  Weight: 131 lb 11.2 oz (59.7 kg)   Physical Exam  Constitutional: She is oriented to person, place, and time. She appears well-developed and well-nourished. No distress.  HENT:  Head: Normocephalic and atraumatic.  Eyes: Conjunctivae are normal. Pupils are equal, round, and reactive to light.  Cardiovascular: Normal rate and regular rhythm.  Exam reveals no gallop and no friction rub.   No murmur heard. Respiratory: Effort normal and breath sounds normal. No respiratory distress. She has no wheezes.  GI: Soft. Bowel sounds are normal. She exhibits no distension. There is no tenderness.  Musculoskeletal: Normal range of motion.  No cspine tenderness. Some tenderness over the neck muscles on the right side. Normal strength and ROM. Some pain with neck movement.   Neurological: She is alert and oriented to person, place, and time.  Skin: She is not diaphoretic.  Psychiatric: She has a normal mood and affect.    Assessment & Plan:   See Encounters Tab for problem based charting.  Patient discussed with Dr. Oswaldo DoneVincent

## 2016-01-12 NOTE — Progress Notes (Signed)
Internal Medicine Clinic Attending  Case discussed with Dr. Ahmed at the time of the visit.  We reviewed the resident's history and exam and pertinent patient test results.  I agree with the assessment, diagnosis, and plan of care documented in the resident's note. 

## 2016-01-14 LAB — METHYLMALONIC ACID, SERUM: Methylmalonic Acid: 1606 nmol/L — ABNORMAL HIGH (ref 0–378)

## 2016-01-14 LAB — VITAMIN B12: VITAMIN B 12: 494 pg/mL (ref 211–946)

## 2016-02-07 ENCOUNTER — Other Ambulatory Visit: Payer: Self-pay | Admitting: Internal Medicine

## 2016-02-07 DIAGNOSIS — E538 Deficiency of other specified B group vitamins: Secondary | ICD-10-CM

## 2016-02-07 MED ORDER — CYANOCOBALAMIN 1000 MCG/ML IJ SOLN
1000.0000 ug | INTRAMUSCULAR | Status: AC
  Administered 2016-02-09 – 2016-02-23 (×3): 1000 ug via INTRAMUSCULAR

## 2016-02-07 NOTE — Assessment & Plan Note (Signed)
I have incorrectly reviewed the last vitamin b12 and MMA level.  Her Vitamin b12 was 494 but MMA was high 1606 after getting B12 injection once in dialysis.  Her b12 was likely falsely normal due to the vitamin B12 shot but MMA being high is a sign of B12 deficiency. She will benefit from B12 injections.  Will do B12 injections weekly via RN visit in our clinic.   Recommend: vitamin b12 weekly for 4 weeks then will do monthly.

## 2016-02-07 NOTE — Progress Notes (Signed)
I have incorrectly reviewed the last vitamin b12 and MMA level.  Her Vitamin b12 was 494 but MMA was high 1606 after getting B12 injection once in dialysis.  Her b12 was likely falsely normal due to the vitamin B12 shot but MMA being high is a sign of B12 deficiency. She will benefit from B12 injections.  Will do B12 injections weekly via RN visit in our clinic.   Recommend: vitamin b12 1000mcg weekly for 4 weeks then will do 1000mcg monthly.   

## 2016-02-08 ENCOUNTER — Telehealth: Payer: Self-pay | Admitting: Internal Medicine

## 2016-02-08 NOTE — Telephone Encounter (Signed)
APT. REMINDER CALL, LMTCB °

## 2016-02-09 ENCOUNTER — Ambulatory Visit (INDEPENDENT_AMBULATORY_CARE_PROVIDER_SITE_OTHER): Payer: Medicaid Other | Admitting: *Deleted

## 2016-02-09 DIAGNOSIS — Z5181 Encounter for therapeutic drug level monitoring: Secondary | ICD-10-CM

## 2016-02-09 DIAGNOSIS — I4891 Unspecified atrial fibrillation: Secondary | ICD-10-CM

## 2016-02-09 DIAGNOSIS — E538 Deficiency of other specified B group vitamins: Secondary | ICD-10-CM

## 2016-02-09 LAB — POCT INR: INR: 1.5

## 2016-02-16 ENCOUNTER — Ambulatory Visit (INDEPENDENT_AMBULATORY_CARE_PROVIDER_SITE_OTHER): Payer: Medicaid Other | Admitting: *Deleted

## 2016-02-16 DIAGNOSIS — E538 Deficiency of other specified B group vitamins: Secondary | ICD-10-CM | POA: Diagnosis present

## 2016-02-23 ENCOUNTER — Ambulatory Visit (INDEPENDENT_AMBULATORY_CARE_PROVIDER_SITE_OTHER): Payer: Medicaid Other | Admitting: *Deleted

## 2016-02-23 DIAGNOSIS — I4891 Unspecified atrial fibrillation: Secondary | ICD-10-CM

## 2016-02-23 DIAGNOSIS — E538 Deficiency of other specified B group vitamins: Secondary | ICD-10-CM

## 2016-02-23 DIAGNOSIS — Z5181 Encounter for therapeutic drug level monitoring: Secondary | ICD-10-CM

## 2016-02-23 LAB — POCT INR: INR: 1.9

## 2016-02-25 ENCOUNTER — Other Ambulatory Visit: Payer: Self-pay | Admitting: Endocrinology

## 2016-03-01 ENCOUNTER — Ambulatory Visit (INDEPENDENT_AMBULATORY_CARE_PROVIDER_SITE_OTHER): Payer: Medicaid Other

## 2016-03-01 DIAGNOSIS — E538 Deficiency of other specified B group vitamins: Secondary | ICD-10-CM

## 2016-03-01 MED ORDER — CYANOCOBALAMIN 1000 MCG/ML IJ SOLN
1000.0000 ug | Freq: Once | INTRAMUSCULAR | Status: AC
  Administered 2016-03-01: 1000 ug via INTRAMUSCULAR

## 2016-03-03 NOTE — Progress Notes (Signed)
B12 NDC [phone]

## 2016-03-08 ENCOUNTER — Ambulatory Visit: Payer: Medicaid Other

## 2016-03-15 ENCOUNTER — Encounter (INDEPENDENT_AMBULATORY_CARE_PROVIDER_SITE_OTHER): Payer: Self-pay

## 2016-03-15 ENCOUNTER — Ambulatory Visit (INDEPENDENT_AMBULATORY_CARE_PROVIDER_SITE_OTHER): Payer: Medicaid Other | Admitting: *Deleted

## 2016-03-15 DIAGNOSIS — I4891 Unspecified atrial fibrillation: Secondary | ICD-10-CM | POA: Diagnosis not present

## 2016-03-15 DIAGNOSIS — Z5181 Encounter for therapeutic drug level monitoring: Secondary | ICD-10-CM | POA: Diagnosis not present

## 2016-03-15 LAB — POCT INR: INR: 1.7

## 2016-03-23 ENCOUNTER — Other Ambulatory Visit: Payer: Self-pay | Admitting: Cardiovascular Disease

## 2016-03-27 ENCOUNTER — Ambulatory Visit (INDEPENDENT_AMBULATORY_CARE_PROVIDER_SITE_OTHER): Payer: Medicaid Other

## 2016-03-27 DIAGNOSIS — I4891 Unspecified atrial fibrillation: Secondary | ICD-10-CM

## 2016-03-27 DIAGNOSIS — Z5181 Encounter for therapeutic drug level monitoring: Secondary | ICD-10-CM | POA: Diagnosis not present

## 2016-03-27 LAB — POCT INR: INR: >8

## 2016-03-28 LAB — PROTIME-INR
INR: 6.9 (ref 0.8–1.2)
Prothrombin Time: 68.2 s — ABNORMAL HIGH (ref 9.1–12.0)

## 2016-03-31 ENCOUNTER — Ambulatory Visit (INDEPENDENT_AMBULATORY_CARE_PROVIDER_SITE_OTHER): Payer: Medicaid Other | Admitting: Pharmacist

## 2016-03-31 DIAGNOSIS — Z5181 Encounter for therapeutic drug level monitoring: Secondary | ICD-10-CM

## 2016-03-31 DIAGNOSIS — I4891 Unspecified atrial fibrillation: Secondary | ICD-10-CM

## 2016-03-31 LAB — POCT INR: INR: 1.5

## 2016-04-07 ENCOUNTER — Telehealth: Payer: Self-pay | Admitting: Internal Medicine

## 2016-04-07 ENCOUNTER — Ambulatory Visit (INDEPENDENT_AMBULATORY_CARE_PROVIDER_SITE_OTHER): Payer: Medicaid Other | Admitting: *Deleted

## 2016-04-07 DIAGNOSIS — Z5181 Encounter for therapeutic drug level monitoring: Secondary | ICD-10-CM

## 2016-04-07 DIAGNOSIS — I4891 Unspecified atrial fibrillation: Secondary | ICD-10-CM | POA: Diagnosis not present

## 2016-04-07 LAB — POCT INR: INR: 1.9

## 2016-04-07 NOTE — Telephone Encounter (Signed)
APT. REMINDER CALL, LMTCB °

## 2016-04-10 ENCOUNTER — Encounter: Payer: Medicaid Other | Admitting: Internal Medicine

## 2016-04-18 ENCOUNTER — Encounter: Payer: Self-pay | Admitting: *Deleted

## 2016-04-24 ENCOUNTER — Other Ambulatory Visit (INDEPENDENT_AMBULATORY_CARE_PROVIDER_SITE_OTHER): Payer: Medicaid Other

## 2016-04-24 ENCOUNTER — Ambulatory Visit (INDEPENDENT_AMBULATORY_CARE_PROVIDER_SITE_OTHER): Payer: Medicaid Other | Admitting: *Deleted

## 2016-04-24 DIAGNOSIS — E890 Postprocedural hypothyroidism: Secondary | ICD-10-CM | POA: Diagnosis not present

## 2016-04-24 DIAGNOSIS — Z5181 Encounter for therapeutic drug level monitoring: Secondary | ICD-10-CM

## 2016-04-24 DIAGNOSIS — I4891 Unspecified atrial fibrillation: Secondary | ICD-10-CM | POA: Diagnosis not present

## 2016-04-24 LAB — T4, FREE: FREE T4: 1.88 ng/dL — AB (ref 0.60–1.60)

## 2016-04-24 LAB — TSH: TSH: 0.29 u[IU]/mL — ABNORMAL LOW (ref 0.35–4.50)

## 2016-04-24 LAB — POCT INR: INR: 4.2

## 2016-04-28 ENCOUNTER — Ambulatory Visit: Payer: Medicaid Other | Admitting: Endocrinology

## 2016-05-01 ENCOUNTER — Encounter: Payer: Self-pay | Admitting: Endocrinology

## 2016-05-01 ENCOUNTER — Ambulatory Visit (INDEPENDENT_AMBULATORY_CARE_PROVIDER_SITE_OTHER): Payer: Medicaid Other | Admitting: Endocrinology

## 2016-05-01 VITALS — BP 140/52 | HR 61 | Ht 63.0 in | Wt 124.0 lb

## 2016-05-01 DIAGNOSIS — E890 Postprocedural hypothyroidism: Secondary | ICD-10-CM

## 2016-05-01 MED ORDER — LEVOTHYROXINE SODIUM 137 MCG PO TABS: 137.0000 ug | ORAL_TABLET | Freq: Every day | ORAL | 3 refills | Status: AC

## 2016-05-01 NOTE — Progress Notes (Signed)
Patient ID: [name], female   DOB: Dec 14, 1935, 81 y.o.   MRN: [id number]   Reason for Appointment:  Hypothyroidism, followup visit    History of Present Illness:   The hypothyroidism was first diagnosed after her radioactive iodine treatment for her [name]' disease  in 08/2010   The patient has been treated with various doses of levothyroxine over the years, generally requiring higher than expected dose for her weight   She usually complains of nonspecific fatigue regardless of her thyroid levels She does have some chronic cold intolerance and some hair loss also She is asking about her scalp itching from the thyroid medication.            The patient is taking the thyroid supplement regularly before breakfast when she does not go for dialysis and before lunch when she goes for dialysis since she does not eat in the morning She takes the medication just before before eating  Not taking any calcium or iron supplements with the levothyroxine    Her Synthroid dose has been the same since 2/16 when it was increased to 150 g  She has lost weight which is apparently from decreased appetite Not complaining about shakiness except occasionally at dialysis, no palpitations now  TSH is now relatively low  Wt Readings from Last 3 Encounters:  05/01/16 124 lb (56.2 kg)  01/10/16 131 lb 11.2 oz (59.7 kg)  12/15/15 135 lb (61.2 kg)    TSH levels as follows:  Lab Results  Component Value Date   TSH 0.29 (L) 04/24/2016   TSH 2.38 10/25/2015   TSH 2.13 07/20/2015   FREET4 1.88 (H) 04/24/2016   FREET4 1.25 10/25/2015   FREET4 1.20 12/21/2014      Allergies as of 05/01/2016      Reactions   Cardizem [diltiazem Hcl] Hives   Iron    ?? Almost died.   Rena-vite [b-plex] Other (See Comments)   Unknown reaction   Codeine Other (See Comments)   dizziness   Pacerone [amiodarone] Other (See Comments)   Visual changes with generic Amiodarone, the pt can only take brand  name Pacerone   Vancomycin Other (See Comments)   dizziness   Venofer [iron Sucrose (iron Oxide Saccharated)] Itching   Latex Itching, Rash      Medication List       Accurate as of 05/01/16  3:15 PM. Always use your most recent med list.          amiodarone 200 MG tablet Commonly known as:  PACERONE Take 1 tablet (200 mg total) by mouth daily.   cycloSPORINE 0.05 % ophthalmic emulsion Commonly known as:  RESTASIS Place 1 drop into both eyes 2 (two) times daily.   metoprolol tartrate 25 MG tablet Commonly known as:  LOPRESSOR Take 0.5 tablets (12.5 mg total) by mouth 2 (two) times daily.   POLY-IRON 150 PO Take 150 mg by mouth daily.   sevelamer 800 MG tablet Commonly known as:  RENAGEL Take 1,600 mg by mouth 3 (three) times daily with meals.   SYNTHROID 150 MCG tablet Generic drug:  levothyroxine TAKE ONE TABLET BY MOUTH ONCE DAILY BEFORE  BREAKFAST   warfarin 5 MG tablet Commonly known as:  COUMADIN TAKE AS DIRECTED BY COUMADIN CLINIC       Allergies:  Allergies  Allergen Reactions  . Cardizem [Diltiazem Hcl] Hives  . Iron     ?? Almost died.  . Rena-Vite [B-Plex] Other (See Comments)    Unknown  reaction  . Codeine Other (See Comments)    dizziness  . Pacerone [Amiodarone] Other (See Comments)    Visual changes with generic Amiodarone, the pt can only take brand name Pacerone  . Vancomycin Other (See Comments)    dizziness  . Venofer [Iron Sucrose (Iron Oxide Saccharated)] Itching  . Latex Itching and Rash    Past Medical History:  Diagnosis Date  . Anemia    BL Hgb 8-9. Likely AOCD in setting of ESRD with iron 48, ferritin  746 (03/2010)  . Anticoagulant long-term use   . Anxiety   . Atrial flutter (HCC)   . Cholelithiases    S/P biliary colic cholecystostomy, but not cholecystectomy secondary to omental adhesions  . ESRD (end stage renal disease) (HCC)    HD for 1 year, HD on Tues, Thurs, Sat // Likely secondary to nephrosclerosis from HTN    . HLD (hyperlipidemia)   . HTN (hypertension)   . Hyperthyroidism DX: 07/2010   Likely Grave's Disease. // On diagnosis, TSH < 0.08, free T4 1.94, free T3 5.3 . //  Thyroid US (07/17/2010) - nonspecific diffuse heterogenous thyroid gland. //  Thyrotropin receptor antibody neg (07/17/2010)  . Non-ST elevation MI (NSTEMI) (HCC)    Noninvasive evaluation with Myoview negative for ischemia.  // 2-D echo (05/2010) LVEF 65-70%. Grade 1 diastolic dysfunction.  Peak PA pressure 52.  Marland Kitchen PAF (paroxysmal atrial fibrillation) (HCC)    On chronic coumadin therapy // Followed by Dr. Excell Seltzer (cardiology)    Past Surgical History:  Procedure Laterality Date  . BREAST LUMPECTOMY     right  . GALLBLADDER SURGERY    . OTHER SURGICAL HISTORY  02/2009   Cholecystostomy secondary to biliary colic - NO cholecystectomy secondary to omental adhesions - by Dr. Bertram Savin  . temple artery biopsy  12/14/10    Family History  Problem Relation Age of Onset  . Stroke    . Heart attack      Social History:  reports that she has never smoked. She has never used smokeless tobacco. She reports that she does not drink alcohol or use drugs.  REVIEW Of SYSTEMS:  She has end-stage kidney disease and is on dialysis  History of atrial fibrillation followed by cardiologist She continues on Coumadin and amiodarone    Examination:   BP (!) 140/52   Pulse 61   Ht 5\' 3"  (1.6 m)   Wt 124 lb (56.2 kg)   SpO2 92%   BMI 21.97 kg/m     Heartrate 64, regular, has ejection murmur present Reflexes appear normal at biceps     Assessment   Hypothyroidism, post ablative with relatively high thyroxine requirement  for her weight of only 122 pounds    She has no specific symptoms related to her hypothyroidism, currently her TSH is slightly low This may be related to her weight loss   Treatment:  She will switch to 137 g, she does better with taking the same tablet dosage every day, even though she was told that she  can take 6-1/2 tablets of the current 150 dosage   Commonly to have her follow-up in 3 months to make sure her labs are consistent She will need to discuss weight loss with PCP or nephrologist  [facility] ([facility]) 05/01/2016, 3:15 PM

## 2016-05-01 NOTE — Patient Instructions (Signed)
Take 1/2 tab 150 thyroid on Tuesdays and 1 on other days

## 2016-05-08 ENCOUNTER — Ambulatory Visit (INDEPENDENT_AMBULATORY_CARE_PROVIDER_SITE_OTHER): Payer: Medicaid Other | Admitting: *Deleted

## 2016-05-08 DIAGNOSIS — I4891 Unspecified atrial fibrillation: Secondary | ICD-10-CM

## 2016-05-08 DIAGNOSIS — Z5181 Encounter for therapeutic drug level monitoring: Secondary | ICD-10-CM | POA: Diagnosis not present

## 2016-05-08 LAB — POCT INR: INR: 3

## 2016-05-20 ENCOUNTER — Emergency Department (HOSPITAL_COMMUNITY): Payer: Medicaid Other

## 2016-05-20 ENCOUNTER — Encounter (HOSPITAL_COMMUNITY): Payer: Self-pay | Admitting: Emergency Medicine

## 2016-05-20 ENCOUNTER — Inpatient Hospital Stay (HOSPITAL_COMMUNITY)
Admission: EM | Admit: 2016-05-20 | Discharge: 2016-06-11 | DRG: 871 | Disposition: E | Payer: Medicaid Other | Attending: Infectious Disease | Admitting: Infectious Disease

## 2016-05-20 DIAGNOSIS — I700 Atherosclerosis of aorta: Secondary | ICD-10-CM | POA: Diagnosis present

## 2016-05-20 DIAGNOSIS — E43 Unspecified severe protein-calorie malnutrition: Secondary | ICD-10-CM | POA: Diagnosis present

## 2016-05-20 DIAGNOSIS — Z79899 Other long term (current) drug therapy: Secondary | ICD-10-CM | POA: Diagnosis not present

## 2016-05-20 DIAGNOSIS — R6521 Severe sepsis with septic shock: Secondary | ICD-10-CM | POA: Diagnosis present

## 2016-05-20 DIAGNOSIS — I5022 Chronic systolic (congestive) heart failure: Secondary | ICD-10-CM | POA: Diagnosis not present

## 2016-05-20 DIAGNOSIS — Z7189 Other specified counseling: Secondary | ICD-10-CM

## 2016-05-20 DIAGNOSIS — N186 End stage renal disease: Secondary | ICD-10-CM | POA: Diagnosis not present

## 2016-05-20 DIAGNOSIS — Z992 Dependence on renal dialysis: Secondary | ICD-10-CM | POA: Diagnosis not present

## 2016-05-20 DIAGNOSIS — L89152 Pressure ulcer of sacral region, stage 2: Secondary | ICD-10-CM | POA: Diagnosis not present

## 2016-05-20 DIAGNOSIS — Z515 Encounter for palliative care: Secondary | ICD-10-CM

## 2016-05-20 DIAGNOSIS — Z7901 Long term (current) use of anticoagulants: Secondary | ICD-10-CM | POA: Diagnosis not present

## 2016-05-20 DIAGNOSIS — I4891 Unspecified atrial fibrillation: Secondary | ICD-10-CM | POA: Diagnosis not present

## 2016-05-20 DIAGNOSIS — I248 Other forms of acute ischemic heart disease: Secondary | ICD-10-CM | POA: Diagnosis present

## 2016-05-20 DIAGNOSIS — I10 Essential (primary) hypertension: Secondary | ICD-10-CM | POA: Diagnosis present

## 2016-05-20 DIAGNOSIS — E860 Dehydration: Secondary | ICD-10-CM | POA: Diagnosis present

## 2016-05-20 DIAGNOSIS — J189 Pneumonia, unspecified organism: Secondary | ICD-10-CM

## 2016-05-20 DIAGNOSIS — L8990 Pressure ulcer of unspecified site, unspecified stage: Secondary | ICD-10-CM | POA: Insufficient documentation

## 2016-05-20 DIAGNOSIS — R531 Weakness: Secondary | ICD-10-CM | POA: Diagnosis not present

## 2016-05-20 DIAGNOSIS — J9691 Respiratory failure, unspecified with hypoxia: Secondary | ICD-10-CM | POA: Diagnosis not present

## 2016-05-20 DIAGNOSIS — E890 Postprocedural hypothyroidism: Secondary | ICD-10-CM | POA: Diagnosis not present

## 2016-05-20 DIAGNOSIS — R05 Cough: Secondary | ICD-10-CM | POA: Diagnosis present

## 2016-05-20 DIAGNOSIS — E785 Hyperlipidemia, unspecified: Secondary | ICD-10-CM | POA: Diagnosis present

## 2016-05-20 DIAGNOSIS — A419 Sepsis, unspecified organism: Principal | ICD-10-CM | POA: Diagnosis present

## 2016-05-20 DIAGNOSIS — I132 Hypertensive heart and chronic kidney disease with heart failure and with stage 5 chronic kidney disease, or end stage renal disease: Secondary | ICD-10-CM | POA: Diagnosis present

## 2016-05-20 DIAGNOSIS — R519 Headache, unspecified: Secondary | ICD-10-CM

## 2016-05-20 DIAGNOSIS — J690 Pneumonitis due to inhalation of food and vomit: Secondary | ICD-10-CM | POA: Diagnosis present

## 2016-05-20 DIAGNOSIS — D638 Anemia in other chronic diseases classified elsewhere: Secondary | ICD-10-CM | POA: Diagnosis present

## 2016-05-20 DIAGNOSIS — E872 Acidosis, unspecified: Secondary | ICD-10-CM | POA: Diagnosis present

## 2016-05-20 DIAGNOSIS — R001 Bradycardia, unspecified: Secondary | ICD-10-CM | POA: Diagnosis present

## 2016-05-20 DIAGNOSIS — E875 Hyperkalemia: Secondary | ICD-10-CM | POA: Diagnosis not present

## 2016-05-20 DIAGNOSIS — R51 Headache: Secondary | ICD-10-CM

## 2016-05-20 DIAGNOSIS — Z66 Do not resuscitate: Secondary | ICD-10-CM | POA: Diagnosis not present

## 2016-05-20 DIAGNOSIS — Z8673 Personal history of transient ischemic attack (TIA), and cerebral infarction without residual deficits: Secondary | ICD-10-CM

## 2016-05-20 DIAGNOSIS — K7290 Hepatic failure, unspecified without coma: Secondary | ICD-10-CM | POA: Diagnosis not present

## 2016-05-20 DIAGNOSIS — E876 Hypokalemia: Secondary | ICD-10-CM | POA: Diagnosis present

## 2016-05-20 DIAGNOSIS — T447X5A Adverse effect of beta-adrenoreceptor antagonists, initial encounter: Secondary | ICD-10-CM | POA: Diagnosis present

## 2016-05-20 DIAGNOSIS — N2581 Secondary hyperparathyroidism of renal origin: Secondary | ICD-10-CM | POA: Diagnosis present

## 2016-05-20 DIAGNOSIS — I4892 Unspecified atrial flutter: Secondary | ICD-10-CM | POA: Diagnosis present

## 2016-05-20 DIAGNOSIS — Z6821 Body mass index (BMI) 21.0-21.9, adult: Secondary | ICD-10-CM

## 2016-05-20 DIAGNOSIS — I480 Paroxysmal atrial fibrillation: Secondary | ICD-10-CM | POA: Diagnosis present

## 2016-05-20 DIAGNOSIS — G9340 Encephalopathy, unspecified: Secondary | ICD-10-CM | POA: Diagnosis not present

## 2016-05-20 DIAGNOSIS — I252 Old myocardial infarction: Secondary | ICD-10-CM

## 2016-05-20 DIAGNOSIS — K559 Vascular disorder of intestine, unspecified: Secondary | ICD-10-CM

## 2016-05-20 DIAGNOSIS — I2510 Atherosclerotic heart disease of native coronary artery without angina pectoris: Secondary | ICD-10-CM | POA: Diagnosis present

## 2016-05-20 DIAGNOSIS — R778 Other specified abnormalities of plasma proteins: Secondary | ICD-10-CM | POA: Diagnosis not present

## 2016-05-20 DIAGNOSIS — M898X9 Other specified disorders of bone, unspecified site: Secondary | ICD-10-CM | POA: Diagnosis present

## 2016-05-20 DIAGNOSIS — R5381 Other malaise: Secondary | ICD-10-CM | POA: Diagnosis present

## 2016-05-20 DIAGNOSIS — I5032 Chronic diastolic (congestive) heart failure: Secondary | ICD-10-CM | POA: Diagnosis not present

## 2016-05-20 DIAGNOSIS — Z9181 History of falling: Secondary | ICD-10-CM | POA: Diagnosis not present

## 2016-05-20 DIAGNOSIS — E162 Hypoglycemia, unspecified: Secondary | ICD-10-CM | POA: Diagnosis not present

## 2016-05-20 DIAGNOSIS — J181 Lobar pneumonia, unspecified organism: Secondary | ICD-10-CM | POA: Diagnosis not present

## 2016-05-20 DIAGNOSIS — R627 Adult failure to thrive: Secondary | ICD-10-CM | POA: Diagnosis not present

## 2016-05-20 DIAGNOSIS — E039 Hypothyroidism, unspecified: Secondary | ICD-10-CM | POA: Diagnosis not present

## 2016-05-20 DIAGNOSIS — R739 Hyperglycemia, unspecified: Secondary | ICD-10-CM | POA: Diagnosis not present

## 2016-05-20 LAB — COMPREHENSIVE METABOLIC PANEL
ALK PHOS: 115 U/L (ref 38–126)
ALT: 37 U/L (ref 14–54)
ANION GAP: 13 (ref 5–15)
AST: 53 U/L — ABNORMAL HIGH (ref 15–41)
Albumin: 2.9 g/dL — ABNORMAL LOW (ref 3.5–5.0)
BUN: 10 mg/dL (ref 6–20)
CALCIUM: 8.7 mg/dL — AB (ref 8.9–10.3)
CO2: 31 mmol/L (ref 22–32)
Chloride: 92 mmol/L — ABNORMAL LOW (ref 101–111)
Creatinine, Ser: 2.59 mg/dL — ABNORMAL HIGH (ref 0.44–1.00)
GFR calc Af Amer: 19 mL/min — ABNORMAL LOW (ref >60)
GFR, EST NON AFRICAN AMERICAN: 17 mL/min — AB (ref >60)
GLUCOSE: 113 mg/dL — AB (ref 65–99)
POTASSIUM: 3.4 mmol/L — AB (ref 3.5–5.1)
Sodium: 136 mmol/L (ref 135–145)
TOTAL PROTEIN: 6.9 g/dL (ref 6.5–8.1)
Total Bilirubin: 1.5 mg/dL — ABNORMAL HIGH (ref 0.3–1.2)

## 2016-05-20 LAB — CBC WITH DIFFERENTIAL/PLATELET
BASOS PCT: 0 %
Basophils Absolute: 0 10*3/uL (ref 0.0–0.1)
EOS PCT: 0 %
Eosinophils Absolute: 0 10*3/uL (ref 0.0–0.7)
HEMATOCRIT: 31.6 % — AB (ref 36.0–46.0)
HEMOGLOBIN: 8.9 g/dL — AB (ref 12.0–15.0)
LYMPHS ABS: 0.7 10*3/uL (ref 0.7–4.0)
LYMPHS PCT: 11 %
MCH: 20.1 pg — ABNORMAL LOW (ref 26.0–34.0)
MCHC: 28.2 g/dL — AB (ref 30.0–36.0)
MCV: 71.3 fL — ABNORMAL LOW (ref 78.0–100.0)
MONOS PCT: 11 %
Monocytes Absolute: 0.7 10*3/uL (ref 0.1–1.0)
NEUTROS ABS: 5.2 10*3/uL (ref 1.7–7.7)
Neutrophils Relative %: 78 %
Platelets: 177 10*3/uL (ref 150–400)
RBC: 4.43 MIL/uL (ref 3.87–5.11)
RDW: 20.4 % — ABNORMAL HIGH (ref 11.5–15.5)
WBC: 6.6 10*3/uL (ref 4.0–10.5)

## 2016-05-20 LAB — I-STAT TROPONIN, ED: TROPONIN I, POC: 0.14 ng/mL — AB (ref 0.00–0.08)

## 2016-05-20 LAB — I-STAT CG4 LACTIC ACID, ED: LACTIC ACID, VENOUS: 2.42 mmol/L — AB (ref 0.5–1.9)

## 2016-05-20 MED ORDER — ACETAMINOPHEN 325 MG PO TABS
650.0000 mg | ORAL_TABLET | Freq: Once | ORAL | Status: AC
  Administered 2016-05-20: 650 mg via ORAL
  Filled 2016-05-20: qty 2

## 2016-05-20 MED ORDER — HYDROCODONE-ACETAMINOPHEN 5-325 MG PO TABS
1.0000 | ORAL_TABLET | Freq: Once | ORAL | Status: AC
  Administered 2016-05-21: 1 via ORAL
  Filled 2016-05-20: qty 1

## 2016-05-20 MED ORDER — IPRATROPIUM-ALBUTEROL 0.5-2.5 (3) MG/3ML IN SOLN
3.0000 mL | Freq: Once | RESPIRATORY_TRACT | Status: AC
  Administered 2016-05-20: 3 mL via RESPIRATORY_TRACT
  Filled 2016-05-20: qty 3

## 2016-05-20 MED ORDER — LEVOFLOXACIN IN D5W 750 MG/150ML IV SOLN
750.0000 mg | Freq: Once | INTRAVENOUS | Status: AC
  Administered 2016-05-20: 750 mg via INTRAVENOUS
  Filled 2016-05-20: qty 150

## 2016-05-20 NOTE — ED Notes (Signed)
Dr Hyacinth MeekerMiller given a copy of lactic acid results 2.42

## 2016-05-20 NOTE — ED Notes (Signed)
Dr Hyacinth MeekerMiller given a copy of troponin results .14

## 2016-05-20 NOTE — ED Provider Notes (Signed)
[facility]-EMERGENCY DEPT Provider Note   CSN: [id number] Arrival date & time: 05/19/2016  2040     History   Chief Complaint Chief Complaint  Patient presents with  . Weakness  . Cough    HPI [name] is a 81 y.o. female.  HPI   81 year old female with history of ESRD on dialysis Monday Tuesday Saturday, history of CAD status post NSTEMI, paroxysmal A. fib on Coumadin, anemia, B12 deficiency, who presents for evaluation of cough and generalized fatigue. Patient states that she's felt very fatigued and generally weak over the last 2 weeks. This usually gets worse after her dialysis sessions. Also endorses some cramping pain in her bilateral legs that comes and goes. Pain is present at rest and not worsen with walking. Denies shortness of breath. States that she's been coughing up globs of yellow sputum over the last several days. Denies fevers or chills. No nausea, vomiting, or diarrhea. States that she's had B12 injections in the past with improvement in her fatigue, but that her most recent injection didn't make her feel any better. She no longer makes any urine. No other aggravating or relieving factors or associated symptoms. Denies chest pain.  Past Medical History:  Diagnosis Date  . Anemia    BL Hgb 8-9. Likely AOCD in setting of ESRD with iron 48, ferritin  746 (03/2010)  . Anticoagulant long-term use   . Anxiety   . Atrial flutter (HCC)   . Cholelithiases    S/P biliary colic cholecystostomy, but not cholecystectomy secondary to omental adhesions  . ESRD (end stage renal disease) (HCC)    HD for 1 year, HD on Tues, Thurs, Sat // Likely secondary to nephrosclerosis from HTN  . HLD (hyperlipidemia)   . HTN (hypertension)   . Hyperthyroidism DX: 07/2010   Likely Grave's Disease. // On diagnosis, TSH < 0.08, free T4 1.94, free T3 5.3 . //  Thyroid US (07/17/2010) - nonspecific diffuse heterogenous thyroid gland. //  Thyrotropin receptor antibody neg (07/17/2010)  . Non-ST  elevation MI (NSTEMI) (HCC)    Noninvasive evaluation with Myoview negative for ischemia.  // 2-D echo (05/2010) LVEF 65-70%. Grade 1 diastolic dysfunction.  Peak PA pressure 52.  Marland Kitchen PAF (paroxysmal atrial fibrillation) (HCC)    On chronic coumadin therapy // Followed by Dr. Excell Seltzer (cardiology)    Patient Active Problem List   Diagnosis Date Noted  . Pneumonia 06/07/2016  . Neck pain 12/08/2015  . Bloating 10/26/2014  . Seasonal allergies 02/17/2014  . CN (constipation) 02/17/2014  . Postnasal drip 07/16/2013  . Dry eyes 07/16/2013  . Encounter for therapeutic drug monitoring 06/18/2013  . Other postablative hypothyroidism 04/10/2013  . Lower extremity weakness 12/03/2012  . Vitamin B12 deficiency 03/20/2012  . Anticoagulant long-term use   . Atrial flutter (HCC) 04/20/2011  . End stage renal disease on dialysis (HCC) 12/07/2010    Class: Chronic  . Gastritis 12/05/2010  . Chronic diastolic congestive heart failure (HCC) 10/11/2009  . UNSPECIFIED VITAMIN D DEFICIENCY 06/24/2009  . HTN (hypertension) 03/17/2008  . Atrial fibrillation with RVR (HCC) 02/22/2008  . Hyperlipidemia 04/18/2006  . Anemia of chronic disease 04/18/2006  . ANXIETY 04/18/2006  . ADVEF, DRUG/MED/BIOL SUBST, DRUG ALLERGY NEC 04/18/2006    Past Surgical History:  Procedure Laterality Date  . BREAST LUMPECTOMY     right  . GALLBLADDER SURGERY    . OTHER SURGICAL HISTORY  02/2009   Cholecystostomy secondary to biliary colic - NO cholecystectomy secondary to omental adhesions - by  Dr. Bertram SavinAmber Allen  . temple artery biopsy  12/14/10    OB History    No data available       Home Medications    Prior to Admission medications   Medication Sig Start Date End Date Taking? Authorizing Provider  amiodarone (PACERONE) 200 MG tablet Take 1 tablet (200 mg total) by mouth daily. 09/06/15  Yes Marinus MawGregg W Taylor, MD  cycloSPORINE (RESTASIS) 0.05 % ophthalmic emulsion Place 1 drop into both eyes 2 (two) times daily.  07/16/13  Yes Ky BarbanSolianny D Kennerly, MD  levothyroxine (SYNTHROID) 137 MCG tablet Take 1 tablet (137 mcg total) by mouth daily before breakfast. 05/01/16  Yes Reather LittlerAjay Kumar, MD  metoprolol tartrate (LOPRESSOR) 25 MG tablet Take 0.5 tablets (12.5 mg total) by mouth 2 (two) times daily. 12/01/15  Yes Denton Brickiana M Truong, MD  warfarin (COUMADIN) 5 MG tablet Take 2.5-5 mg by mouth See admin instructions. Take 1 tablet every day except take 1/2 tablet on Friday   Yes Historical Provider, MD    Family History Family History  Problem Relation Age of Onset  . Stroke    . Heart attack      Social History Social History  Substance Use Topics  . Smoking status: Never Smoker  . Smokeless tobacco: Never Used  . Alcohol use No     Allergies   Cardizem [diltiazem hcl]; Iron; Rena-vite [b-plex]; Codeine; Pacerone [amiodarone]; Vancomycin; Venofer [iron sucrose (iron oxide saccharated)]; and Latex   Review of Systems Review of Systems  Constitutional: Positive for activity change and fatigue. Negative for chills and fever.  HENT: Negative for congestion.   Eyes: Negative for visual disturbance.  Respiratory: Positive for cough and shortness of breath. Negative for wheezing.   Cardiovascular: Negative for chest pain, palpitations and leg swelling.  Gastrointestinal: Negative for abdominal distention, abdominal pain, diarrhea, nausea and vomiting.  Genitourinary: Negative for dysuria, flank pain and frequency.  Musculoskeletal: Positive for myalgias (bilateral thighs). Negative for arthralgias, back pain, gait problem, joint swelling, neck pain and neck stiffness.  Skin: Negative for rash.  Neurological: Positive for weakness (generalized). Negative for dizziness, tremors, syncope, facial asymmetry, speech difficulty, numbness and headaches.  Psychiatric/Behavioral: Negative for agitation, behavioral problems and confusion.     Physical Exam Updated Vital Signs BP (!) 108/45   Pulse (!) 59   Temp 97.9  F (36.6 C) (Oral)   Resp 26   Ht 5\' 3"  (1.6 m)   Wt 58 kg   SpO2 94%   BMI 22.65 kg/m    Physical Exam  Constitutional: She is oriented to person, place, and time. She appears well-developed and well-nourished. No distress.  Thin, appears fatigued  HENT:  Head: Normocephalic and atraumatic.  Right Ear: External ear normal.  Left Ear: External ear normal.  Nose: Nose normal.  Mouth/Throat: Oropharynx is clear and moist. No oropharyngeal exudate.  Eyes: Conjunctivae and EOM are normal. Pupils are equal, round, and reactive to light. Right eye exhibits no discharge. Left eye exhibits no discharge.  Neck: Normal range of motion. Neck supple.  Cardiovascular: Normal heart sounds.  Exam reveals no gallop and no friction rub.   No murmur heard. Mild bradycardia, afib  Pulmonary/Chest: Effort normal. No respiratory distress.  Diffuse end-expiratory wheezing, with scattered rales and rhonchi.   Abdominal: Soft. Bowel sounds are normal. She exhibits no distension. There is no tenderness. There is no guarding.  Musculoskeletal: Normal range of motion. She exhibits tenderness (TTP of the anterior thighs bilaterally). She exhibits no  edema.  Neurological: She is alert and oriented to person, place, and time. She exhibits normal muscle tone.  Skin: Skin is warm and dry. No rash noted. She is not diaphoretic.  Psychiatric: She has a normal mood and affect. Her behavior is normal. Judgment and thought content normal.     ED Treatments / Results  Labs (all labs ordered are listed, but only abnormal results are displayed) Labs Reviewed  CBC WITH DIFFERENTIAL/PLATELET - Abnormal; Notable for the following:       Result Value   Hemoglobin 8.9 (*)    HCT 31.6 (*)    MCV 71.3 (*)    MCH 20.1 (*)    MCHC 28.2 (*)    RDW 20.4 (*)    All other components within normal limits  COMPREHENSIVE METABOLIC PANEL - Abnormal; Notable for the following:    Potassium 3.4 (*)    Chloride 92 (*)     Glucose, Bld 113 (*)    Creatinine, Ser 2.59 (*)    Calcium 8.7 (*)    Albumin 2.9 (*)    AST 53 (*)    Total Bilirubin 1.5 (*)    GFR calc non Af Amer 17 (*)    GFR calc Af Amer 19 (*)    All other components within normal limits  I-STAT TROPOININ, ED - Abnormal; Notable for the following:    Troponin i, poc 0.14 (*)    All other components within normal limits  I-STAT CG4 LACTIC ACID, ED - Abnormal; Notable for the following:    Lactic Acid, Venous 2.42 (*)    All other components within normal limits  CULTURE, BLOOD (ROUTINE X 2)  CULTURE, BLOOD (ROUTINE X 2)  CULTURE, EXPECTORATED SPUTUM-ASSESSMENT  GRAM STAIN  PROTIME-INR  STREP PNEUMONIAE URINARY ANTIGEN  PROTIME-INR  LACTIC ACID, PLASMA  PROCALCITONIN  CBC  BASIC METABOLIC PANEL  APTT    EKG  EKG Interpretation  Date/Time:  Saturday May 20 2016 22:00:11 EST Ventricular Rate:  61 PR Interval:    QRS Duration: 110 QT Interval:  569 QTC Calculation: 574 R Axis:   135 Text Interpretation:  Sinus rhythm Atrial premature complex Posterior infarct, old Probable lateral infarct, age indeterminate Prolonged QT interval ST abnormalities found in the lateral precordial leads more pronounced Confirmed by Hyacinth Meeker  MD, BRIAN ([id number]) on 05/24/2016 10:06:03 PM       Radiology Dg Chest 2 View  Result Date: 06/10/2016 CLINICAL DATA:  Generalized weakness for 1 day. Cough and congestion for 2 weeks. Dialysis this morning. EXAM: CHEST  2 VIEW COMPARISON:  One-view chest x-ray 11/30/2015 FINDINGS: The heart is enlarged. There is no edema. Left lower lobe airspace disease is concerning for pneumonia. Changes of COPD are present. Atherosclerotic calcifications are present at the aortic arch and descending aorta. There is no definite aneurysm. A left subclavian venous stent is in place. IMPRESSION: 1. Left lower lobe airspace disease is concerning for pneumonia. 2. Cardiomegaly without failure. 3. Aortic atherosclerosis. Electronically  Signed   By: Marin Roberts M.D.   On: 05/21/2016 20:51    Procedures Procedures (including critical care time)  Medications Ordered in ED Medications  levofloxacin (LEVAQUIN) IVPB 750 mg (750 mg Intravenous New Bag/Given 06/05/2016 2240)  HYDROcodone-acetaminophen (NORCO/VICODIN) 5-325 MG per tablet 1 tablet (not administered)  warfarin (COUMADIN) tablet 2.5-5 mg (not administered)  levothyroxine (SYNTHROID, LEVOTHROID) tablet 137 mcg (not administered)  amiodarone (PACERONE) tablet 200 mg (not administered)  cycloSPORINE (RESTASIS) 0.05 % ophthalmic emulsion 1 drop (not  administered)  potassium chloride (KLOR-CON) packet 40 mEq (not administered)  ceFEPIme (MAXIPIME) 1 g in dextrose 5 % 50 mL IVPB (not administered)  ipratropium-albuterol (DUONEB) 0.5-2.5 (3) MG/3ML nebulizer solution 3 mL (not administered)  sodium chloride 0.9 % bolus 250 mL (not administered)  ipratropium-albuterol (DUONEB) 0.5-2.5 (3) MG/3ML nebulizer solution 3 mL (3 mLs Nebulization Given 2016-06-05 2210)  acetaminophen (TYLENOL) tablet 650 mg (650 mg Oral Given 06-05-2016 2240)     Initial Impression / Assessment and Plan / ED Course  I have reviewed the triage vital signs and the nursing notes.  Pertinent labs & imaging results that were available during my care of the patient were reviewed by me and considered in my medical decision making (see chart for details).     On arrival the patient is afebrile and hemodynamically stable. Normotensive. Lungs with scattered rales and rhonchi, as well as end expiratory wheezing. DuoNeb given with improvement in wheezing. Chest x-ray reveals a left lower lobe airspace opacity concerning for pneumonia. No leukocytosis. Hgb 8.9. Pt endorses history of anemia and denies blood in her stool or dark tarry stools.  Lactic acidosis to 2.42. EKG with ST depression in the lateral precordial leads that are more pronounced than prior. Initial troponin elevated to 0.14. Pt continues to  deny CP, and I suspect that this is in the setting of demand rather than acute ischemia. Delta troponin pending. Despite elevated lactic acid, in the setting of normal vital signs sounds doubt severe sepsis. Pt started on levaquin for treatment of PNA. Plan to admit to medicine for further management.   Final Clinical Impressions(s) / ED Diagnoses   Final diagnoses:  Healthcare-associated pneumonia    New Prescriptions New Prescriptions   No medications on file     Jenifer Ernestina Penna, MD 05/21/16 [id number]    Charlie Pitter, MD 05/21/16 [id number]    Eber Hong, MD 05/21/16 1036

## 2016-05-20 NOTE — ED Notes (Signed)
Dr. Montez Moritaarter notified about patients c/o of pain

## 2016-05-20 NOTE — ED Triage Notes (Signed)
Pt BIB EMS from home for gen weakness x 1 day; and cough/congestion x 2 weeks. Pt received dialysis this AM, states she started feeling weak last night. resp e/u; NAD noted at this time.

## 2016-05-20 NOTE — ED Provider Notes (Signed)
I saw and evaluated the patient, reviewed the resident's note and I agree with the findings and plan.  Pertinent History: 81 year old female from [place], currently a dialysis patient, access is in the left upper arm. She receives dialysis Tuesdays Thursdays and Saturdays. She states that she has been weak for approximately 2 weeks, she has had some coughing with some phlegm. She denies any vomiting, no diarrhea, no swelling. She states that she gets extremely weak after dialysis and has pain in her legs. She has been having this today.  Pertinent Exam findings: On exam the patient appears to be in no distress, she is not tachycardic, her oxygen is 93% on room air however she does have diffuse mild expiratory wheezing as well as some rales and rhonchi that are scattered. She does not appear to be in respiratory distress and has no increased work of breathing. There is minimal peripheral edema which is symmetrical and she has very strong pulses at the bilateral dorsalis pedis arteries. There is no signs of cellulitis, abdomen is soft and nontender, good thrill to the fistula of the left upper extremity. Oropharynx is clear, mucous members are mildly dehydrated. Patient speaks at her baseline and follows commands. She does appear to have some generalized fatigue but is nonfocal on exam with normal strength and sensation of all 4 extremities when isolated.  R/o PNA, lytes / other process, pt not in distress.  Neb  Labs show elevated troponin - likely demand given the pt's Pneumonia and the increased respiratory symptoms - ECG as below.  D/w Dr. Mayford Knifeurner - agreeable that pt doesn't need cardiology evaluation or Heparin at this time as the pt has likely demand ischemia given PNA. Hospitalist to admit - appreciate their assistance.   EKG Interpretation  Date/Time:  Saturday May 20 2016 22:00:11 EST Ventricular Rate:  61 PR Interval:    QRS Duration: 110 QT Interval:  569 QTC Calculation: 574 R  Axis:   135 Text Interpretation:  Sinus rhythm Atrial premature complex Posterior infarct, old Probable lateral infarct, age indeterminate Prolonged QT interval ST abnormalities found in the lateral precordial leads more pronounced Confirmed by Ege Muckey  MD, Meeka Cartelli ([id number]) on 05/17/2016 10:06:03 PM        I personally interpreted the EKG as well as the resident and agree with the interpretation on the resident's chart.  Final diagnoses:  Healthcare-associated pneumonia      Eber HongBrian Seleta Hovland, MD 05/21/16 1036

## 2016-05-21 ENCOUNTER — Encounter (HOSPITAL_COMMUNITY): Payer: Self-pay | Admitting: *Deleted

## 2016-05-21 ENCOUNTER — Inpatient Hospital Stay (HOSPITAL_COMMUNITY): Payer: Medicaid Other

## 2016-05-21 LAB — LACTIC ACID, PLASMA
Lactic Acid, Venous: 5.9 mmol/L (ref 0.5–1.9)
Lactic Acid, Venous: 5.4 mmol/L (ref 0.5–1.9)

## 2016-05-21 LAB — CBC
HEMATOCRIT: 29.9 % — AB (ref 36.0–46.0)
HEMOGLOBIN: 8.4 g/dL — AB (ref 12.0–15.0)
MCH: 20.1 pg — AB (ref 26.0–34.0)
MCHC: 28.1 g/dL — AB (ref 30.0–36.0)
MCV: 71.5 fL — AB (ref 78.0–100.0)
Platelets: 194 10*3/uL (ref 150–400)
RBC: 4.18 MIL/uL (ref 3.87–5.11)
RDW: 20.4 % — ABNORMAL HIGH (ref 11.5–15.5)
WBC: 7 10*3/uL (ref 4.0–10.5)

## 2016-05-21 LAB — BASIC METABOLIC PANEL
Anion gap: 14 (ref 5–15)
BUN: 13 mg/dL (ref 6–20)
CHLORIDE: 95 mmol/L — AB (ref 101–111)
CO2: 25 mmol/L (ref 22–32)
Calcium: 8.7 mg/dL — ABNORMAL LOW (ref 8.9–10.3)
Creatinine, Ser: 2.98 mg/dL — ABNORMAL HIGH (ref 0.44–1.00)
GFR calc Af Amer: 16 mL/min — ABNORMAL LOW (ref >60)
GFR calc non Af Amer: 14 mL/min — ABNORMAL LOW (ref >60)
Glucose, Bld: 111 mg/dL — ABNORMAL HIGH (ref 65–99)
POTASSIUM: 4.5 mmol/L (ref 3.5–5.1)
SODIUM: 134 mmol/L — AB (ref 135–145)

## 2016-05-21 LAB — PROCALCITONIN: Procalcitonin: 1 ng/mL

## 2016-05-21 LAB — PROTIME-INR
INR: 2.28
INR: 2.38
PROTHROMBIN TIME: 25.5 s — AB (ref 11.4–15.2)
Prothrombin Time: 26.4 seconds — ABNORMAL HIGH (ref 11.4–15.2)

## 2016-05-21 LAB — TROPONIN I
TROPONIN I: 0.08 ng/mL — AB (ref <0.03)
Troponin I: 0.08 ng/mL (ref <0.03)
Troponin I: 0.1 ng/mL (ref <0.03)

## 2016-05-21 LAB — MRSA PCR SCREENING: MRSA by PCR: NEGATIVE

## 2016-05-21 LAB — APTT: aPTT: 55 seconds — ABNORMAL HIGH (ref 24–36)

## 2016-05-21 MED ORDER — WARFARIN - PHARMACIST DOSING INPATIENT: Freq: Every day | Status: DC

## 2016-05-21 MED ORDER — MORPHINE SULFATE (PF) 2 MG/ML IV SOLN
1.0000 mg | INTRAVENOUS | Status: DC | PRN
  Administered 2016-05-21: 1 mg via INTRAVENOUS
  Filled 2016-05-21: qty 1

## 2016-05-21 MED ORDER — IPRATROPIUM-ALBUTEROL 0.5-2.5 (3) MG/3ML IN SOLN
3.0000 mL | RESPIRATORY_TRACT | Status: DC | PRN
Start: 2016-05-21 — End: 2016-05-28

## 2016-05-21 MED ORDER — AMIODARONE HCL 200 MG PO TABS
200.0000 mg | ORAL_TABLET | Freq: Every day | ORAL | Status: DC
  Administered 2016-05-21 – 2016-05-22 (×2): 200 mg via ORAL
  Filled 2016-05-21 (×2): qty 1

## 2016-05-21 MED ORDER — WARFARIN SODIUM 5 MG PO TABS: 5.0000 mg | ORAL_TABLET | ORAL | Status: DC

## 2016-05-21 MED ORDER — POTASSIUM CHLORIDE 20 MEQ/15ML (10%) PO SOLN
40.0000 meq | Freq: Once | ORAL | Status: AC
  Administered 2016-05-21: 40 meq via ORAL
  Filled 2016-05-21: qty 30

## 2016-05-21 MED ORDER — LEVOTHYROXINE SODIUM 25 MCG PO TABS
137.0000 ug | ORAL_TABLET | Freq: Every day | ORAL | Status: DC
  Administered 2016-05-21 – 2016-05-22 (×2): 137 ug via ORAL
  Filled 2016-05-21 (×3): qty 1

## 2016-05-21 MED ORDER — WARFARIN SODIUM 2.5 MG PO TABS
2.5000 mg | ORAL_TABLET | ORAL | Status: DC
  Administered 2016-05-21: 2.5 mg via ORAL
  Filled 2016-05-21: qty 1

## 2016-05-21 MED ORDER — SODIUM CHLORIDE 0.9 % IV BOLUS (SEPSIS)
250.0000 mL | Freq: Once | INTRAVENOUS | Status: AC
  Administered 2016-05-21: 250 mL via INTRAVENOUS

## 2016-05-21 MED ORDER — DEXTROSE 5 % IV SOLN
1.0000 g | INTRAVENOUS | Status: DC
  Administered 2016-05-21 – 2016-05-22 (×2): 1 g via INTRAVENOUS
  Filled 2016-05-21 (×2): qty 1

## 2016-05-21 MED ORDER — CYCLOSPORINE 0.05 % OP EMUL
1.0000 [drp] | Freq: Two times a day (BID) | OPHTHALMIC | Status: DC
  Administered 2016-05-21 – 2016-05-27 (×13): 1 [drp] via OPHTHALMIC
  Filled 2016-05-21 (×16): qty 1

## 2016-05-21 MED ORDER — WARFARIN SODIUM 2.5 MG PO TABS: 2.5000 mg | ORAL_TABLET | ORAL | Status: DC

## 2016-05-21 NOTE — H&P (Signed)
History and Physical    Vola Beneke RUE:[id number] DOB: Jul 31, 1935 DOA: Jun 12, 2016  PCP: Hyacinth Meeker, MD   Patient coming from: Home  Chief Complaint: Productive cough, generalized weakness  HPI: [name] is a 81 y.o. woman with a history of atrial fibrillation (CHADS-Vasc score at least 4, anticoagulated with warfarin), ESRD on HD T/Th/Sat, HTN, HLD, prior CVA, CAD, and hyperthyroidism who presents to the ED for evaluation of progressive generalized weakness and shortness of breath.  She has been declining for two weeks.  She has a cough productive of yellow sputum.  No hemoptysis.  No chest pain.  She has had some light-headedness.  She had a fall several days ago with reported head trauma.  She has had intermittent headache since then.  No fever, chills, sweats, nausea, or vomiting.  She has not had any recent hospitalizations, but she dialyzes three times per week.  She also reports that her husband has had a recent cold.  ED Course: Lactic acid level 2.42.  Chest xray shows a LLL infiltrate.  Normal WBC count.  Hgb 8.9, which is lower Sept 2017 when she was around 10.5.  Potassium 3.4.  Troponin 0.14.  EKG shows diffuse ST depressions in inferior and lateral leads.  Per ED attending, EKG findings and troponin level discussed with cardiology attending on call.  No indication for anticoagulation at this time.  Will treat sepsis and pneumonia.  Hospitalist asked to admit.  Review of Systems: As per HPI otherwise 10 point review of systems negative.    Past Medical History:  Diagnosis Date  . Anemia    BL Hgb 8-9. Likely AOCD in setting of ESRD with iron 48, ferritin  746 (03/2010)  . Anticoagulant long-term use   . Anxiety   . Atrial flutter (HCC)   . Cholelithiases    S/P biliary colic cholecystostomy, but not cholecystectomy secondary to omental adhesions  . ESRD (end stage renal disease) (HCC)    HD for 1 year, HD on Tues, Thurs, Sat // Likely secondary to  nephrosclerosis from HTN  . HLD (hyperlipidemia)   . HTN (hypertension)   . Hyperthyroidism DX: 07/2010   Likely Grave's Disease. // On diagnosis, TSH < 0.08, free T4 1.94, free T3 5.3 . //  Thyroid US (07/17/2010) - nonspecific diffuse heterogenous thyroid gland. //  Thyrotropin receptor antibody neg (07/17/2010)  . Non-ST elevation MI (NSTEMI) (HCC)    Noninvasive evaluation with Myoview negative for ischemia.  // 2-D echo (05/2010) LVEF 65-70%. Grade 1 diastolic dysfunction.  Peak PA pressure 52.  Marland Kitchen PAF (paroxysmal atrial fibrillation) (HCC)    On chronic coumadin therapy // Followed by Dr. Excell Seltzer (cardiology)    Past Surgical History:  Procedure Laterality Date  . BREAST LUMPECTOMY     right  . GALLBLADDER SURGERY    . OTHER SURGICAL HISTORY  02/2009   Cholecystostomy secondary to biliary colic - NO cholecystectomy secondary to omental adhesions - by Dr. Bertram Savin  . temple artery biopsy  12/14/10     reports that she has never smoked. She has never used smokeless tobacco. She reports that she does not drink alcohol or use drugs. She is married.  She says that she does not have any adult children.  Allergies  Allergen Reactions  . Cardizem [Diltiazem Hcl] Hives  . Iron     ?? Almost died.  . Rena-Vite [B-Plex] Other (See Comments)    Unknown reaction  . Codeine Other (See Comments)    dizziness  .  Pacerone [Amiodarone] Other (See Comments)    Visual changes with generic Amiodarone, the pt can only take brand name Pacerone  . Vancomycin Other (See Comments)    dizziness  . Venofer [Iron Sucrose (Iron Oxide Saccharated)] Itching  . Latex Itching and Rash    Family History  Problem Relation Age of Onset  . Stroke    . Heart attack       Prior to Admission medications   Medication Sig Start Date End Date Taking? Authorizing Provider  amiodarone (PACERONE) 200 MG tablet Take 1 tablet (200 mg total) by mouth daily. 09/06/15  Yes Marinus Maw, MD  cycloSPORINE  (RESTASIS) 0.05 % ophthalmic emulsion Place 1 drop into both eyes 2 (two) times daily. 07/16/13  Yes Ky Barban, MD  levothyroxine (SYNTHROID) 137 MCG tablet Take 1 tablet (137 mcg total) by mouth daily before breakfast. 05/01/16  Yes Reather Littler, MD  metoprolol tartrate (LOPRESSOR) 25 MG tablet Take 0.5 tablets (12.5 mg total) by mouth 2 (two) times daily. 12/01/15  Yes Denton Brick, MD  warfarin (COUMADIN) 5 MG tablet Take 2.5-5 mg by mouth See admin instructions. Take 1 tablet every day except take 1/2 tablet on Friday   Yes Historical Provider, MD    Physical Exam: Vitals:   05/22/2016 2130 06/01/2016 2200 06/06/2016 2330 05/15/2016 2345  BP: (!) 122/43 (!) 106/40 (!) 106/44 (!) 108/45  Pulse: (!) 59 (!) 59    Resp:  21 23 26   Temp:      TempSrc:      SpO2: 97% 94%    Weight:      Height:          Constitutional: NAD, calm but ill appearing Vitals:   06/10/2016 2130 05/19/2016 2200 06/04/2016 2330 06/08/2016 2345  BP: (!) 122/43 (!) 106/40 (!) 106/44 (!) 108/45  Pulse: (!) 59 (!) 59    Resp:  21 23 26   Temp:      TempSrc:      SpO2: 97% 94%    Weight:      Height:       Eyes: PERRL, lids and conjunctivae normal ENMT: Mucous membranes are moist. Posterior pharynx not completely visualized.  Normal dentition.  Neck: goiter present Respiratory: Bilateral ronchi.  Intermittent wheezing.  Normal respiratory effort. No accessory muscle use.  Cardiovascular: Normal rate, regular rhythm, no murmurs / rubs / gallops. Bilateral ankle edema.  2+ pedal pulses. GI: abdomen is soft and compressible.  No distention.  No tenderness.  No masses palpated.  Bowel sounds are present. Musculoskeletal:  No joint deformity in upper and lower extremities. Good ROM, no contractures. Normal muscle tone.  Skin: no rashes, warm and dry Neurologic: No apparent focal deficits.  Generalized weakness.  Moves all four extremities spontaneously. Psychiatric: Normal judgment and insight. Alert and oriented x 3.  Normal mood.     Labs on Admission: I have personally reviewed following labs and imaging studies  CBC:  Recent Labs Lab 05/18/2016 2127  WBC 6.6  NEUTROABS 5.2  HGB 8.9*  HCT 31.6*  MCV 71.3*  PLT 177   Basic Metabolic Panel:  Recent Labs Lab 05/29/2016 2127  NA 136  K 3.4*  CL 92*  CO2 31  GLUCOSE 113*  BUN 10  CREATININE 2.59*  CALCIUM 8.7*   GFR: Estimated Creatinine Clearance: 14.6 mL/min (by C-G formula based on SCr of 2.59 mg/dL (H)). Liver Function Tests:  Recent Labs Lab 05/15/2016 2127  AST 53*  ALT 37  ALKPHOS 115  BILITOT 1.5*  PROT 6.9  ALBUMIN 2.9*   Sepsis Labs:  Lactic acid level 2.42  Radiological Exams on Admission: Dg Chest 2 View  Result Date: 05/25/2016 CLINICAL DATA:  Generalized weakness for 1 day. Cough and congestion for 2 weeks. Dialysis this morning. EXAM: CHEST  2 VIEW COMPARISON:  One-view chest x-ray 11/30/2015 FINDINGS: The heart is enlarged. There is no edema. Left lower lobe airspace disease is concerning for pneumonia. Changes of COPD are present. Atherosclerotic calcifications are present at the aortic arch and descending aorta. There is no definite aneurysm. A left subclavian venous stent is in place. IMPRESSION: 1. Left lower lobe airspace disease is concerning for pneumonia. 2. Cardiomegaly without failure. 3. Aortic atherosclerosis. Electronically Signed   By: Marin Robertshristopher  Mattern M.D.   On: 06/01/2016 20:51    EKG: Independently reviewed by me.  NSR.  Diffuse ST depression in inferior and lateral leads.  Assessment/Plan Principal Problem:   Pneumonia Active Problems:   HTN (hypertension)   Atrial fibrillation with RVR (HCC)   Chronic diastolic congestive heart failure (HCC)   End stage renal disease on dialysis (HCC)   Anticoagulant long-term use      Mild sepsis secondary to LLL pneumonia --IV levaquin given in the ED.  Will change to IV cefepime.  No vancomycin due to allergy.  Pharmacy to dose. --Will give a  small NS bolus of 250cc one time now.   --Blood and sputum cultures --Check procalcitonin level --Respiratory therapy, duonebs prn for wheezing, shortness of breath --She needs the stepdown unit, at least tonight, for relative hypotension and bradycardia.  Holding metoprolol for now.  Weakness, shortness of breath, abnormal troponin --Serial troponin --If rising, will need formal cardiology consult in the AM --Already on warfarin for anticoagulation  Reported fall with head trauma, she is anticoagulated --STAT head CT --STAT INR  History of atrial fibrillation --Pharmacy to manage warfarin --Continue amiodarone --Holding metoprolol due to relative hypotension and bradycardia  ESRD on HD, patient says that she dialyzed today --Will need to notify nephrology of her admission in the AM  Thyroid disease --Continue home dose of levothyroxine  Hypokalemia --Conservative replacement ordered x 1        DVT prophylaxis: Anticoagulated with warfarin Code Status: FULL Family Communication: Patient alone in the ED at the time of admission. Disposition Plan: To be determined. Consults called: Will need to call nephrology in the AM (HD patient) Admission status: Inpatient stepdown unit.  I expect this patient will need inpatient services for greater than two midnights.   TIME SPENT: 70 minutes   Jerene Bearsarter,Gesselle Fitzsimons Harrison MD [facility] Pager [phone]  If 7PM-7AM, please contact night-coverage [email or web address] Password [email or web address]  05/21/2016, 12:07 AM

## 2016-05-21 NOTE — Discharge Instructions (Signed)

## 2016-05-21 NOTE — Progress Notes (Signed)
Patient admitted earlier today by Dr. Michael LitterNikki Carter. See full H&P for details. Patient is an internal medicine residency patient-they will assume care on 05/22/2016, Dr. Loney Lohathore made aware.  81 year old female history of atrial fibrillation, on Coumadin, ESRD, hypertension, coronary disease, presented to the emergency department with progressive weakness and shortness of breath. Admitted for pneumonia and placed on cefepime. Vancomycin not ordered as patient does have allergy and MRSA negative. Blood cultures currently pending. Patient also did report fall wire to admission, CT head unremarkable.   Time spent: 25 minutes  Casanova Schurman D.O. [facility] Pager [phone]  If 7PM-7AM, please contact night-coverage [email or web address] Password [email or web address] 05/21/2016, 1:04 PM

## 2016-05-21 NOTE — Progress Notes (Signed)
ANTICOAGULATION CONSULT NOTE - Initial Consult  Pharmacy Consult for Coumadin Indication: atrial fibrillation  Allergies  Allergen Reactions  . Cardizem [Diltiazem Hcl] Hives  . Iron     ?? Almost died.  . Rena-Vite [B-Plex] Other (See Comments)    Unknown reaction  . Codeine Other (See Comments)    dizziness  . Pacerone [Amiodarone] Other (See Comments)    Visual changes with generic Amiodarone, the pt can only take brand name Pacerone  . Vancomycin Other (See Comments)    dizziness  . Venofer [Iron Sucrose (Iron Oxide Saccharated)] Itching  . Latex Itching and Rash    Patient Measurements: Height: 5\' 2"  (157.5 cm) Weight: 125 lb (56.7 kg) IBW/kg (Calculated) : 50.1  Vital Signs: Temp: 97.6 F (36.4 C) (03/11 0040) Temp Source: Oral (03/11 0040) BP: 108/45 (03/10 2345) Pulse Rate: 59 (03/10 2200)  Labs:  Recent Labs  05-25-2016 2127 05/21/16 0305  HGB 8.9*  --   HCT 31.6*  --   PLT 177  --   APTT  --  55*  LABPROT  --  25.5*  INR  --  2.28  CREATININE 2.59*  --     Estimated Creatinine Clearance: 13.9 mL/min (by C-G formula based on SCr of 2.59 mg/dL (H)).   Medical History: Past Medical History:  Diagnosis Date  . Anemia    BL Hgb 8-9. Likely AOCD in setting of ESRD with iron 48, ferritin  746 (03/2010)  . Anticoagulant long-term use   . Anxiety   . Atrial flutter (HCC)   . Cholelithiases    S/P biliary colic cholecystostomy, but not cholecystectomy secondary to omental adhesions  . ESRD (end stage renal disease) (HCC)    HD for 1 year, HD on Tues, Thurs, Sat // Likely secondary to nephrosclerosis from HTN  . HLD (hyperlipidemia)   . HTN (hypertension)   . Hyperthyroidism DX: 07/2010   Likely Grave's Disease. // On diagnosis, TSH < 0.08, free T4 1.94, free T3 5.3 . //  Thyroid US (07/17/2010) - nonspecific diffuse heterogenous thyroid gland. //  Thyrotropin receptor antibody neg (07/17/2010)  . Non-ST elevation MI (NSTEMI) (HCC)    Noninvasive  evaluation with Myoview negative for ischemia.  // 2-D echo (05/2010) LVEF 65-70%. Grade 1 diastolic dysfunction.  Peak PA pressure 52.  Marland Kitchen PAF (paroxysmal atrial fibrillation) (HCC)    On chronic coumadin therapy // Followed by Dr. Excell Seltzer (cardiology)    Medications:  Prescriptions Prior to Admission  Medication Sig Dispense Refill Last Dose  . amiodarone (PACERONE) 200 MG tablet Take 1 tablet (200 mg total) by mouth daily. 90 tablet 3 May 25, 2016 at Unknown time  . cycloSPORINE (RESTASIS) 0.05 % ophthalmic emulsion Place 1 drop into both eyes 2 (two) times daily. 0.4 mL 0 May 25, 2016 at Unknown time  . levothyroxine (SYNTHROID) 137 MCG tablet Take 1 tablet (137 mcg total) by mouth daily before breakfast. 30 tablet 3 05/19/2016 at Unknown time  . metoprolol tartrate (LOPRESSOR) 25 MG tablet Take 0.5 tablets (12.5 mg total) by mouth 2 (two) times daily. 30 tablet 3 05/25/2016 at 0800  . warfarin (COUMADIN) 5 MG tablet Take 2.5-5 mg by mouth See admin instructions. Take 1 tablet every day except take 1/2 tablet on Friday   05/19/2016 at 1600   Scheduled:  . amiodarone  200 mg Oral Daily  . ceFEPime (MAXIPIME) IV  1 g Intravenous Q24H  . cycloSPORINE  1 drop Both Eyes BID  . levothyroxine  137 mcg Oral QAC breakfast  Assessment: 81yo female c/o productive cough and generalized weakness, admitted for PNA, to continue Coumadin for Afib; current INR at goal with last Coumadin dose taken 3/9.  Goal of Therapy:  INR 2-3   Plan:  Will continue home Coumadin dose of 5mg  daily except 2.5mg  on Friday and monitor INR.  Vernard GamblesVeronda Faust [place]orington, PharmD, BCPS  05/21/2016,4:05 AM

## 2016-05-21 NOTE — Progress Notes (Signed)
ANTICOAGULATION CONSULT NOTE   Pharmacy Consult for Coumadin Indication: atrial fibrillation  Allergies  Allergen Reactions  . Cardizem [Diltiazem Hcl] Hives  . Iron     ?? Almost died.  . Rena-Vite [B-Plex] Other (See Comments)    Unknown reaction  . Codeine Other (See Comments)    dizziness  . Pacerone [Amiodarone] Other (See Comments)    Visual changes with generic Amiodarone, the pt can only take brand name Pacerone  . Vancomycin Other (See Comments)    dizziness  . Venofer [Iron Sucrose (Iron Oxide Saccharated)] Itching  . Latex Itching and Rash    Patient Measurements: Height: 5\' 2"  (157.5 cm) Weight: 125 lb (56.7 kg) IBW/kg (Calculated) : 50.1  Vital Signs: Temp: 98.3 F (36.8 C) (03/11 1141) Temp Source: Oral (03/11 1141) BP: 110/42 (03/11 1141) Pulse Rate: 67 (03/11 1141)  Labs:  Recent Labs  04/29/16 2127 05/21/16 0305 05/21/16 0515  HGB 8.9*  --  8.4*  HCT 31.6*  --  29.9*  PLT 177  --  194  APTT  --  55*  --   LABPROT  --  25.5* 26.4*  INR  --  2.28 2.38  CREATININE 2.59*  --  2.98*  TROPONINI  --  0.08* 0.08*    Estimated Creatinine Clearance: 12.1 mL/min (by C-G formula based on SCr of 2.98 mg/dL (H)).    Assessment: 81yo female c/o productive cough and generalized weakness, admitted for PNA, to continue Coumadin for Afib; current INR at goal. Last dose given: 3/11 0500. CBC is low but stable. No signs of bleeding noted.   PTA dose (per coumadin clinic and clarified with husband): 2.5 all days, except 5 on Fridays  DDI: Amiodarone as PTA   Goal of Therapy:  INR 2-3   Plan:  Continue home dose of warfarin 2.5 mg every day except Friday 5 mg on Friday Daily INR Monitor for s/sx of bleeding  Bailey MechEmily Stewart, PharmD PGY1 Pharmacy Resident Pager: [phone] 05/21/2016,12:30 PM

## 2016-05-21 NOTE — Progress Notes (Signed)
Patient's Lactic Acid 5.9. Dr. Emmit PomfretHugelmeyer made aware. No response yet. Repeat Lactic Acid order placed by RN.

## 2016-05-21 NOTE — Plan of Care (Signed)
Problem: Pain Managment: Goal: General experience of comfort will improve Outcome: Progressing Patient c/o 10/10 bilateral leg pain. PRN Morphine 1 mg given. Pain now 0/10.  Will continue to monitor.

## 2016-05-22 ENCOUNTER — Inpatient Hospital Stay (HOSPITAL_COMMUNITY): Payer: Medicaid Other

## 2016-05-22 ENCOUNTER — Encounter (HOSPITAL_COMMUNITY): Payer: Self-pay | Admitting: Pulmonary Disease

## 2016-05-22 DIAGNOSIS — E890 Postprocedural hypothyroidism: Secondary | ICD-10-CM

## 2016-05-22 DIAGNOSIS — Z7189 Other specified counseling: Secondary | ICD-10-CM

## 2016-05-22 DIAGNOSIS — R778 Other specified abnormalities of plasma proteins: Secondary | ICD-10-CM

## 2016-05-22 DIAGNOSIS — J181 Lobar pneumonia, unspecified organism: Secondary | ICD-10-CM

## 2016-05-22 DIAGNOSIS — I252 Old myocardial infarction: Secondary | ICD-10-CM

## 2016-05-22 DIAGNOSIS — G9340 Encephalopathy, unspecified: Secondary | ICD-10-CM

## 2016-05-22 DIAGNOSIS — E872 Acidosis, unspecified: Secondary | ICD-10-CM | POA: Diagnosis present

## 2016-05-22 DIAGNOSIS — I700 Atherosclerosis of aorta: Secondary | ICD-10-CM | POA: Diagnosis present

## 2016-05-22 DIAGNOSIS — I480 Paroxysmal atrial fibrillation: Secondary | ICD-10-CM | POA: Diagnosis present

## 2016-05-22 DIAGNOSIS — I2510 Atherosclerotic heart disease of native coronary artery without angina pectoris: Secondary | ICD-10-CM

## 2016-05-22 DIAGNOSIS — Z9181 History of falling: Secondary | ICD-10-CM

## 2016-05-22 LAB — COMPREHENSIVE METABOLIC PANEL
ALT: 363 U/L — AB (ref 14–54)
AST: 1044 U/L — AB (ref 15–41)
Albumin: 2.6 g/dL — ABNORMAL LOW (ref 3.5–5.0)
Alkaline Phosphatase: 106 U/L (ref 38–126)
Anion gap: 19 — ABNORMAL HIGH (ref 5–15)
BUN: 34 mg/dL — AB (ref 6–20)
CHLORIDE: 91 mmol/L — AB (ref 101–111)
CO2: 23 mmol/L (ref 22–32)
CREATININE: 5.37 mg/dL — AB (ref 0.44–1.00)
Calcium: 10.1 mg/dL (ref 8.9–10.3)
GFR calc Af Amer: 8 mL/min — ABNORMAL LOW (ref >60)
GFR, EST NON AFRICAN AMERICAN: 7 mL/min — AB (ref >60)
Glucose, Bld: 203 mg/dL — ABNORMAL HIGH (ref 65–99)
Potassium: 4.9 mmol/L (ref 3.5–5.1)
Sodium: 133 mmol/L — ABNORMAL LOW (ref 135–145)
Total Bilirubin: 2.6 mg/dL — ABNORMAL HIGH (ref 0.3–1.2)
Total Protein: 6.2 g/dL — ABNORMAL LOW (ref 6.5–8.1)

## 2016-05-22 LAB — BASIC METABOLIC PANEL
Anion gap: 24 — ABNORMAL HIGH (ref 5–15)
BUN: 29 mg/dL — ABNORMAL HIGH (ref 6–20)
CHLORIDE: 94 mmol/L — AB (ref 101–111)
CO2: 18 mmol/L — ABNORMAL LOW (ref 22–32)
CREATININE: 4.78 mg/dL — AB (ref 0.44–1.00)
Calcium: 9.9 mg/dL (ref 8.9–10.3)
GFR calc Af Amer: 9 mL/min — ABNORMAL LOW (ref >60)
GFR calc non Af Amer: 8 mL/min — ABNORMAL LOW (ref >60)
Glucose, Bld: 24 mg/dL — CL (ref 65–99)
Potassium: 5.5 mmol/L — ABNORMAL HIGH (ref 3.5–5.1)
Sodium: 136 mmol/L (ref 135–145)

## 2016-05-22 LAB — CBC WITH DIFFERENTIAL/PLATELET
BASOS PCT: 0 %
Basophils Absolute: 0 10*3/uL (ref 0.0–0.1)
EOS PCT: 0 %
Eosinophils Absolute: 0 10*3/uL (ref 0.0–0.7)
HEMATOCRIT: 30 % — AB (ref 36.0–46.0)
HEMOGLOBIN: 8.6 g/dL — AB (ref 12.0–15.0)
LYMPHS ABS: 0.5 10*3/uL — AB (ref 0.7–4.0)
Lymphocytes Relative: 4 %
MCH: 20.5 pg — AB (ref 26.0–34.0)
MCHC: 28.7 g/dL — AB (ref 30.0–36.0)
MCV: 71.4 fL — ABNORMAL LOW (ref 78.0–100.0)
MONOS PCT: 6 %
Monocytes Absolute: 0.8 10*3/uL (ref 0.1–1.0)
NEUTROS ABS: 11.9 10*3/uL — AB (ref 1.7–7.7)
Neutrophils Relative %: 90 %
Platelets: 205 10*3/uL (ref 150–400)
RBC: 4.2 MIL/uL (ref 3.87–5.11)
RDW: 20.6 % — ABNORMAL HIGH (ref 11.5–15.5)
WBC: 13.2 10*3/uL — ABNORMAL HIGH (ref 4.0–10.5)

## 2016-05-22 LAB — PROTIME-INR
INR: 3.61
INR: 5.3
Prothrombin Time: 36.9 seconds — ABNORMAL HIGH (ref 11.4–15.2)
Prothrombin Time: 49 seconds — ABNORMAL HIGH (ref 11.4–15.2)

## 2016-05-22 LAB — LACTIC ACID, PLASMA
Lactic Acid, Venous: 12.3 mmol/L (ref 0.5–1.9)
Lactic Acid, Venous: 7.5 mmol/L (ref 0.5–1.9)

## 2016-05-22 LAB — GLUCOSE, CAPILLARY
GLUCOSE-CAPILLARY: 247 mg/dL — AB (ref 65–99)
GLUCOSE-CAPILLARY: 128 mg/dL — AB (ref 65–99)
Glucose-Capillary: 29 mg/dL — CL (ref 65–99)

## 2016-05-22 LAB — LIPASE, BLOOD: Lipase: 329 U/L — ABNORMAL HIGH (ref 11–51)

## 2016-05-22 LAB — PROCALCITONIN: PROCALCITONIN: 2.13 ng/mL

## 2016-05-22 MED ORDER — DEXTROSE 50 % IV SOLN
2.0000 | INTRAVENOUS | Status: AC
  Administered 2016-05-22: 100 mL via INTRAVENOUS

## 2016-05-22 MED ORDER — DEXTROSE 50 % IV SOLN
INTRAVENOUS | Status: AC
  Filled 2016-05-22: qty 100

## 2016-05-22 MED ORDER — DEXTROSE 5 % IV SOLN: 1.0000 g | INTRAVENOUS | Status: DC

## 2016-05-22 MED ORDER — AZITHROMYCIN 250 MG PO TABS
500.0000 mg | ORAL_TABLET | Freq: Every day | ORAL | Status: AC
  Administered 2016-05-22: 500 mg via ORAL
  Filled 2016-05-22: qty 2

## 2016-05-22 MED ORDER — SODIUM POLYSTYRENE SULFONATE 15 GM/60ML PO SUSP
15.0000 g | Freq: Once | ORAL | Status: AC
  Administered 2016-05-22: 15 g via ORAL
  Filled 2016-05-22: qty 60

## 2016-05-22 MED ORDER — AZITHROMYCIN 250 MG PO TABS: 250.0000 mg | ORAL_TABLET | Freq: Every day | ORAL | Status: DC

## 2016-05-22 MED ORDER — PIPERACILLIN-TAZOBACTAM IN DEX 2-0.25 GM/50ML IV SOLN
2.2500 g | Freq: Three times a day (TID) | INTRAVENOUS | Status: DC
  Administered 2016-05-22 – 2016-05-23 (×3): 2.25 g via INTRAVENOUS
  Filled 2016-05-22 (×4): qty 50

## 2016-05-22 MED ORDER — PRO-STAT SUGAR FREE PO LIQD
30.0000 mL | Freq: Two times a day (BID) | ORAL | Status: DC
  Administered 2016-05-22: 30 mL via ORAL
  Filled 2016-05-22: qty 30

## 2016-05-22 NOTE — Progress Notes (Signed)
Dr. Vincente LibertyMolt and Dr. Dimple Caseyice with the Internal Medicine service came to patient's bedside and made aware of her elevated INR and Lactic Acid levels. No new orders at this time.  Will continue to monitor patient.

## 2016-05-22 NOTE — Consult Note (Signed)
PULMONARY / CRITICAL CARE MEDICINE   Name: [name] MRN: [id number] DOB: 11/17/1935    ADMISSION DATE:  2016-06-12 CONSULTATION DATE:  06-12-2016  REFERRING MD:  Dr. Mikey Bussing   CHIEF COMPLAINT:  Weakness, cough/congestion x2 weeks  HISTORY OF PRESENT ILLNESS:   81 y/o [place] female admitted with weakness and 2-3 week history of weakness, poor PO intake and occasional cough.  The patient's husband reports they have been together 53 years.  He states she has "given up" in the last weeks.  She has been stating she doesn't want to go to dialysis any more.  Also, she has not been eating / drinking as usual.    She was initially admitted with concerns for possible LLL PNA and treated with IV antibiotics.  Lactate was initially elevated at 6.  The patient was found on 3/12 to have increased lactic acid and hypoglycemia with associated altered mental status.    PCCM consulted for evaluation.  PAST MEDICAL HISTORY :  She  has a past medical history of Anemia; Anticoagulant long-term use; Anxiety; Atrial flutter (HCC); Cholelithiases; ESRD (end stage renal disease) (HCC); HLD (hyperlipidemia); HTN (hypertension); Hyperthyroidism (DX: 07/2010); Non-ST elevation MI (NSTEMI) (HCC); and PAF (paroxysmal atrial fibrillation) (HCC).  PAST SURGICAL HISTORY: She  has a past surgical history that includes Other surgical history (02/2009); Gallbladder surgery; Breast lumpectomy; and temple artery biopsy (12/14/10).  Allergies  Allergen Reactions  . Cardizem [Diltiazem Hcl] Hives  . Iron     ?? Almost died.  . Rena-Vite [B-Plex] Other (See Comments)    Unknown reaction  . Codeine Other (See Comments)    dizziness  . Pacerone [Amiodarone] Other (See Comments)    Visual changes with generic Amiodarone, the pt can only take brand name Pacerone  . Vancomycin Other (See Comments)    dizziness  . Venofer [Iron Sucrose (Iron Oxide Saccharated)] Itching  . Latex Itching and Rash    No current  facility-administered medications on file prior to encounter.    Current Outpatient Prescriptions on File Prior to Encounter  Medication Sig  . amiodarone (PACERONE) 200 MG tablet Take 1 tablet (200 mg total) by mouth daily.  . cycloSPORINE (RESTASIS) 0.05 % ophthalmic emulsion Place 1 drop into both eyes 2 (two) times daily.  Marland Kitchen levothyroxine (SYNTHROID) 137 MCG tablet Take 1 tablet (137 mcg total) by mouth daily before breakfast.  . metoprolol tartrate (LOPRESSOR) 25 MG tablet Take 0.5 tablets (12.5 mg total) by mouth 2 (two) times daily.    FAMILY HISTORY:  Her @FAMSTP (<SUBSCRIPT> error)@  SOCIAL HISTORY: She  reports that she has never smoked. She has never used smokeless tobacco. She reports that she does not drink alcohol or use drugs.  REVIEW OF SYSTEMS:  Unable to complete with patient due to altered mental status  SUBJECTIVE:   VITAL SIGNS: BP 120/60 (BP Location: Right Arm)   Pulse 63   Temp 97.5 F (36.4 C) (Axillary)   Resp 19   Ht 5\' 2"  (1.575 m)   Wt 130 lb 6.4 oz (59.1 kg)   SpO2 100%   BMI 23.85 kg/m   HEMODYNAMICS:    VENTILATOR SETTINGS:    INTAKE / OUTPUT: I/O last 3 completed shifts: In: 890 [P.O.:720; Other:120; IV Piggyback:50] Out: -   PHYSICAL EXAMINATION: General: frail elderly female in NAD HEENT: MM pink/moist, no jvd  Neuro: awakens to voice, oriented, drowsy CV: s1s2 rrr, no m/r/g PULM: even/non-labored, clear on R, scattered rhonchi on L NU:UVOZ, non-tender, bsx4 active  Extremities: warm/dry, no edema  Skin: no rashes or lesions   LABS:  BMET  Recent Labs Lab 05/26/2016 2127 05/21/16 0515 05/22/16 1004  NA 136 134* 136  K 3.4* 4.5 5.5*  CL 92* 95* 94*  CO2 31 25 18*  BUN 10 13 29*  CREATININE 2.59* 2.98* 4.78*  GLUCOSE 113* 111* 24*    Electrolytes  Recent Labs Lab 05/21/2016 2127 05/21/16 0515 05/22/16 1004  CALCIUM 8.7* 8.7* 9.9    CBC  Recent Labs Lab 06/09/2016 2127 05/21/16 0515  WBC 6.6 7.0  HGB  8.9* 8.4*  HCT 31.6* 29.9*  PLT 177 194    Coag's  Recent Labs Lab 05/21/16 0305 05/21/16 0515 05/22/16 0319  APTT 55*  --   --   INR 2.28 2.38 3.61    Sepsis Markers  Recent Labs Lab 05/21/16 0305 05/21/16 0620 05/22/16 1004  LATICACIDVEN 5.9* 5.4* 12.3*  PROCALCITON 1.00  --   --     ABG No results for input(s): PHART, PCO2ART, PO2ART in the last 168 hours.  Liver Enzymes  Recent Labs Lab 05/29/2016 2127  AST 53*  ALT 37  ALKPHOS 115  BILITOT 1.5*  ALBUMIN 2.9*    Cardiac Enzymes  Recent Labs Lab 05/21/16 0305 05/21/16 0515 05/21/16 1032  TROPONINI 0.08* 0.08* 0.10*    Glucose  Recent Labs Lab 05/22/16 1136 05/22/16 1311  GLUCAP 29* 247*    Imaging No results found.   STUDIES:  CT Head 3/11 >> no acute findings, generalized atrophy/chronic white matter hypodensities  CULTURES: BCx2 3/10 >>   ANTIBIOTICS: Cefepime 3/11 >> 3/12 Levaquin 3/10 x1  Zosyn 3/12 >>   SIGNIFICANT EVENTS: 3/10  Admit with weakness, ? LLL PNA  LINES/TUBES:   DISCUSSION: 81 y/o F admitted with weakness, poor PO intake  ASSESSMENT / PLAN:  PULMONARY A: Possible LLL Infiltrate / PNA  P:   Continue abx, D3/x  Pulmonary hygiene - IS, mobilize as able  O2 as needed to support sats > 92% Repeat CXR   CARDIOVASCULAR A:  Elevated Troponin - suspected demand + poor clearance with ESRD Hx AF/Flutter on AC, HTN, HLD, MI P:  Tele monitoring  Continue coumadin per pharmacy  Amiodarone   RENAL A:   ESRD on HD (T,Th,S) AG Acidosis - Lactic Acidosis  Hyperkalemia  P:   HD per Nephrology  Trend lactate Trend BMP / urinary output Replace electrolytes as indicated  GASTROINTESTINAL A:   Severe Protein Calorie Malnutrition ? Ischemic Bowel with elevated lactic acid - BP not below 100 since admit P:   Diet as tolerated  Trend lactate CT ABD/Pelvis to evaluate for ischemic bowel.  Doubt she would be a candidate for surgery.   HEMATOLOGIC A:    Anemia P:  Trend CBC   INFECTIOUS A:   Possible LLL Infiltrate P:   Continue abx as ordered   ENDOCRINE A:   Hypoglycemia  Hyperthyroidism/Grave's Disease  P:   Synthroid per primary   NEUROLOGIC A:   Hx Anxiety  P:   Supportive care    FAMILY  - Updates: Husband updated at bedside.  He feels she has "given up". He indicates she would not want advanced life support or ACLS.    - Inter-disciplinary family meet or Palliative Care meeting due by:  ongoing   Canary Brim, NP-C [facility] Pgr: [phone] or if no answer [phone] 05/22/2016, 3:01 PM  Attending Note:  81 year old female with extensive PMH of ESRD-HD  who presents to the hospital from dialysis unit with severe weakness.  Patient was brought to the ED where there was a concern for LLL PNA and was started on cefepime.  Pan cultures done and PCT drawn (1.0).  On 3/12 the patient was noted be altered.  CBG was 24 with some improvement with being given glucose.  Lactic acid was also redrawn that increased from 6 to 12.  Vitals have been otherwise stable on exam with crackles L>R.  I reviewed CXR myself, LLL haziness noted but that is from 3 days ago.    I had an extensive discussion with the husband who understood english well and was able to verbalize back what he heard and refused a Nurse, learning disabilitytranslator.  Evidently, patient has essentially given up and had been wanting to stop going to dialysis.  She has been refusing food and only drinking water lately.  I am concerned that patient is end-stage and that doing more here is just prolonging her suffering.  After speaking with the husband very openly, he informed us that patient would not want this level of care with intubation/CPR/cardioversion/pressors.  She would be ok with hydration and antibiotics however.  Will make DNR.  Recommend continuing abx.  Will check another PCT to see if clearing then lactic acidosis is unlikely to be related to sepsis.   Another differential would be if patient is having an ischemic bowel event.  Physical exam does not support it but will scan abdomen without contrast for evaluation of signs of ischemic bowel.  PCCM will follow.  The patient is critically ill with multiple organ systems failure and requires high complexity decision making for assessment and support, frequent evaluation and titration of therapies, application of advanced monitoring technologies and extensive interpretation of multiple databases.   Critical Care Time devoted to patient care services described in this note is  35  Minutes. This time reflects time of care of this signee Dr Koren BoundWesam Yacoub. This critical care time does not reflect procedure time, or teaching time or supervisory time of PA/NP/Med student/Med Resident etc but could involve care discussion time.  Alyson ReedyWesam G. Yacoub, M.D. [facility]. Pager: [phone]. After hours pager: [phone].

## 2016-05-22 NOTE — Evaluation (Addendum)
Physical Therapy Evaluation Patient Details Name: [name] Levesque MRN: [id number] DOB: August 22, 1935 Today's Date: 05/22/2016   History of Present Illness  Pt adm with weakness and found to have PNA. PMH - ESRD on HD, HTN, MI, chf, cad, Graves disease  Clinical Impression  Pt admitted with above diagnosis and presents to PT with functional limitations due to deficits listed below (See PT problem list). Pt needs skilled PT to maximize independence and safety to allow discharge to SNF. Pt with very limited mobility and pt's husband unable to manage her current level of care.      Follow Up Recommendations SNF    Equipment Recommendations  Other (comment) (To be assessed)    Recommendations for Other Services       Precautions / Restrictions Precautions Precautions: Fall Restrictions Weight Bearing Restrictions: No      Mobility  Bed Mobility Overal bed mobility: Needs Assistance Bed Mobility: Supine to Sit;Sit to Supine     Supine to sit: +2 for physical assistance;Max assist Sit to supine: +2 for physical assistance;Max assist   General bed mobility comments: Assist to bring legs off of bed, elevate trunk into sitting, and bring hips to EOB. Assist to lower trunk and bring legs back into bed when returning to supine.  Transfers Overall transfer level: Needs assistance Equipment used: Rolling walker (2 wheeled) Transfers: Sit to/from Stand Sit to Stand: +2 physical assistance;Mod assist         General transfer comment: Assist to bring hips and trunk up and for balance. Pt with posterior legs braced against bed in standing with posterior lean. Placed pt's hands on walker due to her blindness. Blocked lt foot due to tendency of foot to slide out. Removed O2 during standing with SpO2 maintaining >94%  Ambulation/Gait             General Gait Details: Unable  Information systems managertairs            Wheelchair Mobility    Modified Rankin (Stroke Patients Only)       Balance  Overall balance assessment: Needs assistance Sitting-balance support: Bilateral upper extremity supported;Feet supported Sitting balance-Leahy Scale: Poor Sitting balance - Comments: Min assist to sit EOB   Standing balance support: Bilateral upper extremity supported Standing balance-Leahy Scale: Poor Standing balance comment: walker and +2 mod A to maintain standing with posterior legs braced on bed                             Pertinent Vitals/Pain Pain Assessment: Faces Faces Pain Scale: Hurts a little bit Pain Location: Generalized Pain Descriptors / Indicators: Other (Comment) (Uncomfortable) Pain Intervention(s): Limited activity within patient's tolerance;Repositioned    Home Living Family/patient expects to be discharged to:: Private residence Living Arrangements: Spouse/significant other Available Help at Discharge: Family;Available 24 hours/day Type of Home: House Home Access: Stairs to enter   Entergy CorporationEntrance Stairs-Number of Steps: 1 Home Layout: One level Home Equipment: Environmental consultantWalker - 2 wheels      Prior Function Level of Independence: Needs assistance   Gait / Transfers Assistance Needed: Amb modified independent with RW for household distances           Hand Dominance        Extremity/Trunk Assessment   Upper Extremity Assessment Upper Extremity Assessment: Defer to OT evaluation    Lower Extremity Assessment Lower Extremity Assessment: RLE deficits/detail;LLE deficits/detail RLE Deficits / Details: strength grossly 3-/5 LLE Deficits / Details: strength grossly 3-/5  Communication   Communication: Prefers language other than English  Cognition Arousal/Alertness: Lethargic Behavior During Therapy: Restless Overall Cognitive Status: Impaired/Different from baseline Area of Impairment: Following commands;Problem solving       Following Commands: Follows one step commands with increased time     Problem Solving: Slow  processing;Decreased initiation;Difficulty sequencing;Requires verbal cues;Requires tactile cues      General Comments      Exercises     Assessment/Plan    PT Assessment Patient needs continued PT services  PT Problem List Decreased strength;Decreased activity tolerance;Decreased balance;Decreased mobility;Decreased cognition       PT Treatment Interventions DME instruction;Gait training;Functional mobility training;Therapeutic activities;Therapeutic exercise;Balance training;Patient/family education    PT Goals (Current goals can be found in the Care Plan section)  Acute Rehab PT Goals Patient Stated Goal: Not stated PT Goal Formulation: With family Time For Goal Achievement: 06/05/16 Potential to Achieve Goals: Fair    Frequency Min 3X/week   Barriers to discharge        Co-evaluation               End of Session Equipment Utilized During Treatment: Gait belt Activity Tolerance: Patient limited by fatigue;Patient limited by lethargy Patient left: in bed;with call bell/phone within reach;with family/visitor present;with bed alarm set Nurse Communication: Mobility status PT Visit Diagnosis: Muscle weakness (generalized) (M62.81);Difficulty in walking, not elsewhere classified (R26.2)         Time: 9562-1308 PT Time Calculation (min) (ACUTE ONLY): 17 min   Charges:   PT Evaluation $PT Eval Moderate Complexity: 1 Procedure     PT G CodesAngelina Ok [facility] 2016-05-29, 3:32 PM [facility] PT [phone]

## 2016-05-22 NOTE — Consult Note (Signed)
[facility] Renal Consultation Note    Indication for Consultation:  Management of ESRD/hemodialysis, anemia, hypertension/volume, and secondary hyperparathyroidism. PCP:  HPI: [name] is a 81 y.o. female with ESRD, Atrial fibrillation/flutter (on anticoagulation), HTN, hyperthyroidism who was admitted with pneumonia.   Has been feeling weak with productive cough and dyspnea for the past few days. Presented to the ED and found to have LLL pneumonia, started on Cefepime. Since admission, still having some dyspnea. On nasal oxygen. Denies CP or abdominal pain, N/V or diarrhea.  Last dialysis 3/10, usually TTS schedule. No recent issues with dialysis or her fistula, per patient's husband.  Past Medical History:  Diagnosis Date  . Anemia    BL Hgb 8-9. Likely AOCD in setting of ESRD with iron 48, ferritin  746 (03/2010)  . Anticoagulant long-term use   . Anxiety   . Atrial flutter (HCC)   . Cholelithiases    S/P biliary colic cholecystostomy, but not cholecystectomy secondary to omental adhesions  . ESRD (end stage renal disease) (HCC)    HD for 1 year, HD on Tues, Thurs, Sat // Likely secondary to nephrosclerosis from HTN  . HLD (hyperlipidemia)   . HTN (hypertension)   . Hyperthyroidism DX: 07/2010   Likely Grave's Disease. // On diagnosis, TSH < 0.08, free T4 1.94, free T3 5.3 . //  Thyroid US (07/17/2010) - nonspecific diffuse heterogenous thyroid gland. //  Thyrotropin receptor antibody neg (07/17/2010)  . Non-ST elevation MI (NSTEMI) (HCC)    Noninvasive evaluation with Myoview negative for ischemia.  // 2-D echo (05/2010) LVEF 65-70%. Grade 1 diastolic dysfunction.  Peak PA pressure 52.  Marland Kitchen PAF (paroxysmal atrial fibrillation) (HCC)    On chronic coumadin therapy // Followed by Dr. Excell Seltzer (cardiology)   Past Surgical History:  Procedure Laterality Date  . BREAST LUMPECTOMY     right  . GALLBLADDER SURGERY    . OTHER SURGICAL HISTORY  02/2009    Cholecystostomy secondary to biliary colic - NO cholecystectomy secondary to omental adhesions - by Dr. Bertram Savin  . temple artery biopsy  12/14/10   Family History  Problem Relation Age of Onset  . Stroke    . Heart attack     Social History:  reports that she has never smoked. She has never used smokeless tobacco. She reports that she does not drink alcohol or use drugs.  ROS: As per HPI otherwise negative.  Physical Exam: Vitals:   05/22/16 0423 05/22/16 0427 05/22/16 0843 05/22/16 1300  BP: (!) 126/36 (!) 110/34 (!) 109/38 120/60  Pulse: 62  (!) 58 63  Resp: (!) 24  (!) 22 19  Temp: 97.4 F (36.3 C)   97.5 F (36.4 C)  TempSrc: Axillary   Axillary  SpO2: 93%  91% 100%  Weight: 59.1 kg (130 lb 6.4 oz)     Height:         General: Well developed, well nourished, in no acute distress. On nasal oxygen. Head: Normocephalic, atraumatic, sclera non-icteric. Neck: Supple without lymphadenopathy/masses. JVD not elevated. Lungs: Rhonchi throughout all lung fields. Heart: RRR; 2/6 systolic murmur appreciated. Abdomen: Soft, non-tender, non-distended with normoactive bowel sounds.  Musculoskeletal:  Strength and tone appear normal for age. Lower extremities: No LE edema or ischemic changes, no open wounds. Neuro: Alert and oriented X 3. Moves all extremities spontaneously. Psych:  Responds to questions appropriately with a normal affect. Dialysis Access: LUE AVF + thrill  Allergies  Allergen Reactions  . Cardizem [Diltiazem Hcl] Hives  .  Iron     ?? Almost died.  . Rena-Vite [B-Plex] Other (See Comments)    Unknown reaction  . Codeine Other (See Comments)    dizziness  . Pacerone [Amiodarone] Other (See Comments)    Visual changes with generic Amiodarone, the pt can only take brand name Pacerone  . Vancomycin Other (See Comments)    dizziness  . Venofer [Iron Sucrose (Iron Oxide Saccharated)] Itching  . Latex Itching and Rash   Prior to Admission medications    Medication Sig Start Date End Date Taking? Authorizing Provider  amiodarone (PACERONE) 200 MG tablet Take 1 tablet (200 mg total) by mouth daily. 09/06/15  Yes Marinus Maw, MD  cycloSPORINE (RESTASIS) 0.05 % ophthalmic emulsion Place 1 drop into both eyes 2 (two) times daily. 07/16/13  Yes Ky Barban, MD  levothyroxine (SYNTHROID) 137 MCG tablet Take 1 tablet (137 mcg total) by mouth daily before breakfast. 05/01/16  Yes Reather Littler, MD  metoprolol tartrate (LOPRESSOR) 25 MG tablet Take 0.5 tablets (12.5 mg total) by mouth 2 (two) times daily. 12/01/15  Yes Denton Brick, MD  warfarin (COUMADIN) 5 MG tablet Take 2.5-5 mg by mouth See admin instructions. Take 1/2 tablet every day except take 1 tablet on Friday per husband   Yes Historical Provider, MD   Current Facility-Administered Medications  Medication Dose Route Frequency Provider Last Rate Last Dose  . amiodarone (PACERONE) tablet 200 mg  200 mg Oral Daily Michael Litter, MD   200 mg at 05/22/16 1154  . cycloSPORINE (RESTASIS) 0.05 % ophthalmic emulsion 1 drop  1 drop Both Eyes BID Michael Litter, MD   1 drop at 05/22/16 1155  . dextrose 50 % solution           . ipratropium-albuterol (DUONEB) 0.5-2.5 (3) MG/3ML nebulizer solution 3 mL  3 mL Nebulization Q4H PRN Michael Litter, MD      . levothyroxine (SYNTHROID, LEVOTHROID) tablet 137 mcg  137 mcg Oral QAC breakfast Michael Litter, MD   137 mcg at 05/22/16 [phone]  . Warfarin - Pharmacist Dosing Inpatient   Does not apply q1800 Juliette Mangle, [facility]       Labs: Basic Metabolic Panel:  Recent Labs Lab 05/24/2016 2127 05/21/16 0515 05/22/16 1004  NA 136 134* 136  K 3.4* 4.5 5.5*  CL 92* 95* 94*  CO2 31 25 18*  GLUCOSE 113* 111* 24*  BUN 10 13 29*  CREATININE 2.59* 2.98* 4.78*  CALCIUM 8.7* 8.7* 9.9   Liver Function Tests:  Recent Labs Lab 05/16/2016 2127  AST 53*  ALT 37  ALKPHOS 115  BILITOT 1.5*  PROT 6.9  ALBUMIN 2.9*   CBC:  Recent Labs Lab 05/15/2016 2127  05/21/16 0515  WBC 6.6 7.0  NEUTROABS 5.2  --   HGB 8.9* 8.4*  HCT 31.6* 29.9*  MCV 71.3* 71.5*  PLT 177 194   Cardiac Enzymes:  Recent Labs Lab 05/21/16 0305 05/21/16 0515 05/21/16 1032  TROPONINI 0.08* 0.08* 0.10*   CBG:  Recent Labs Lab 05/22/16 1136 05/22/16 1311  GLUCAP 29* 247*   Studies/Results: Dg Chest 2 View  Result Date: 06/08/2016 CLINICAL DATA:  Generalized weakness for 1 day. Cough and congestion for 2 weeks. Dialysis this morning. EXAM: CHEST  2 VIEW COMPARISON:  One-view chest x-ray 11/30/2015 FINDINGS: The heart is enlarged. There is no edema. Left lower lobe airspace disease is concerning for pneumonia. Changes of COPD are present. Atherosclerotic calcifications are present at the aortic arch and descending  aorta. There is no definite aneurysm. A left subclavian venous stent is in place. IMPRESSION: 1. Left lower lobe airspace disease is concerning for pneumonia. 2. Cardiomegaly without failure. 3. Aortic atherosclerosis. Electronically Signed   By: Marin Robertshristopher  Mattern M.D.   On: 05/31/2016 20:51   Ct Head Wo Contrast  Result Date: 05/21/2016 CLINICAL DATA:  Headache EXAM: CT HEAD WITHOUT CONTRAST TECHNIQUE: Contiguous axial images were obtained from the base of the skull through the vertex without intravenous contrast. COMPARISON:  None. FINDINGS: Brain: There is no intracranial hemorrhage, mass or evidence of acute infarction. There is moderate generalized atrophy. There is moderate chronic microvascular ischemic change. There is no significant extra-axial fluid collection. No acute intracranial findings are evident. Vascular: No hyperdense vessel or unexpected calcification. Skull: Normal. Negative for fracture or focal lesion. Sinuses/Orbits: No acute finding. Other: None. IMPRESSION: No acute intracranial findings. There is moderate generalized atrophy and chronic appearing white matter hypodensities which likely represent small vessel ischemic disease.  Electronically Signed   By: Ellery Plunkaniel R Mitchell M.D.   On: 05/21/2016 04:42    Dialysis Orders:  [facility], BFR400/DFR800, EDW 52kg, 2K/2Ca, Linear Na, Profile #4 - Heparin 1400 unit bolus q HD - Hectoral 3mcg IV q HD - Mircera 225 q 2 weeks (just changed from Aranesp 200mcg weekly, last given 3/8).  Assessment/Plan: 1.  LLL Pneumonia: Given Cefepime, Azithromycin, Levaquin. Per primary. 2.  ESRD: Will continue TTS schedule, next HD 3/13. K borderline, 2K bath tomorrow. 3.  Hypertension/volume: BP controlled. Per scale, 7kg above EDW? Weight not likely accurate. UF as tolerated with next HD. 4.  Anemia:  Hgb 8.4; not due for ESA yet. 5.  Metabolic bone disease: Corr Ca slightly high, Phos pending. Not getting binder here, will follow labs. Hold on starting VDRA for now. 6.  Nutrition:  Alb 2.9, start Pro-stat supps. 7. A. Fib/flutter: Warfarin per pharmacy. On amiodarone. 8. Hypo and hyperglycemia: Variable glucose readings. No Dx DM and not on meds, follow.  Ozzie HoyleKatie Stovall, PA-C 05/22/2016, 1:58 PM  [facility] Pager: [phone]  I have seen and examined this patient and agree with plan and assessment in the above note with renal recommendations/intervention highlighted. Given her hyperkalemia, will dose with kayexalate 15gr x 1 to temporize before HD first shift tomorrow.  Julien NordmannJoseph A Harvis Mabus,MD 05/22/2016 3:18 PM

## 2016-05-22 NOTE — Progress Notes (Signed)
Subjective:  Patient transferred to IMTS from [facility] hospitalist service.   In brief, she is a 81yo woman with PMHx of ESRD on HD, atrial fibrillation on coumadin, chronic diastolic HF, HTN, CAD with hx NSTEMI, and hx Grave's disease s/p ablation who was admitted on 06/08/2016 after presenting with generalized weakness and shortness of breath. Patient found to have a LLL pneumonia on CXR and was started on Cefepime. Patient also noted to have a fall at home and CT head was negative for any acute intracranial bleed.   She reports her SOB is improving but she still feels weak. Husband is at her bedside and notes she had been coughing and had associated chest pain but both of these are getting better. He also notes the patient has not been eating well and more weak than usual.   Objective:  Vital signs in last 24 hours: Vitals:   05/21/16 2114 05/22/16 0052 05/22/16 0423 05/22/16 0427  BP:   (!) 126/36 (!) 110/34  Pulse:   62   Resp:   (!) 24   Temp: 98 F (36.7 C) 97.5 F (36.4 C) 97.4 F (36.3 C)   TempSrc: Oral Axillary Axillary   SpO2:   93%   Weight:   130 lb 6.4 oz (59.1 kg)   Height:       General: Frail elderly woman sitting up in bed, NAD HEENT: [place]/AT, EOMI, sclera anicteric, mucus membranes moist CV: RRR, no m/g/r Pulm: Bilateral crackles, minimal end-expiratory wheezing, breaths non-labored on room air Abd: BS+, soft, non-tender Ext: warm, trace edema bilaterally Neuro: alert and oriented x 3  Assessment/Plan:  Community Acquired Pneumonia: Noted to have a LLL infiltrate on CXR. She was started on Cefepime. Blood cultures pending. Will switch her to Ceftriaxone/Azithro as she has not had any recent admissions and likely has CAP. She seems to be improving overall as far as her SOB and cough.  - Switch to Ceftriaxone/Azithro - Stop Cefepime - f/u blood cx - Duonebs Q4H PRN  Generalized Weakness: Patient is an elderly frail woman that has apparently been declining over  the last several weeks. I suspect her generalized weakness if related to her comorbidities and now exacerbated by her acute pneumonia. Will have patient work with PT/OT to get out of bed. - PT/OT evals  Lactic Acidosis: Lactic acid 5.9 on admission and then 5.4 on repeat. Has not been repeated since that time. Will repeat to ensure clearing. Patient does not appear toxic at this time. Lactic acid could have been elevated due to demand ischemia as well. - f/u repeat lactate   Elevated troponin: Troponin noted to be elevated on admission at 0.08>0.08>0.10. EKG with more pronounced ST depressions in the lateral leads. ED physician had discussed case with cardiology and was determined to not need any further evaluation or be heparinized as likely demand due to acute illness from pneumonia. Patient not having any chest pain currently.  - Cardiac monitoring   Reported head trauma due to fall: Had a fall at home prior to admission. CT head done on admission was negative for any acute intracranial bleed.   Diastolic CHF: Last echo in March 2017 shows EF 65-70%, no wall motion abnormalities, with grade 2 diastolic dysfunction. She does not appear fluid overloaded at this time.  - Hold home Lopressor given low-nml BPs  Atrial fibrillation: CHADS-VAsc score 5. She is on Amiodarone, Lopressor, and anticoagulation with coumadin at home. INR supratherapeutic today at 3.6.  - Continue Amiodarone -  Hold Lopressor with low-normal blood pressures - Coumadin per pharmacy - INR daily   ESRD on HD: On TThSat schedule. Dr. Eliott Nineunham is her nephrologist. She last had HD on Sat, 3/10. She does not appear volume overloaded and K stable at 4.5 (actually was hypokalemic on admission).  - Renal consulted for HD  Hypothyroidism: Hx Grave's disease s/p radioactive iodine tx in June 2012. Last TSH 0.29, T4 1.88 in Feb this year, checked at her endocrinologist's office. Her levothyroxine was increased to 137 mcg daily. -  Continue home dose levothyroxine    Diet: Renal DVT PPx: Coumadin  Dispo: Anticipated discharge in approximately 1-2 day(s).    Rich Numberarly Rivet, MD, MPH Internal Medicine Resident, PGY-III Pager: [phone] 05/22/2016, 7:20 AM

## 2016-05-22 NOTE — Progress Notes (Signed)
Pharmacy Antibiotic Note  [name] is a 81 y.o. female admitted on 06/10/2016 with pneumonia and sepsis.  Pharmacy has been consulted for zosyn dosing.  Patient has been receiving cefepime for pneumonia, coverage is now being adjusted to zosyn. No new fevers, wbc normal, she continues to have lactic acidosis. Will adjust zosyn dose given patient is on dialysis.   Plan: Zosyn 2.25g IV q8 hours  Height: 5\' 2"  (157.5 cm) Weight: 130 lb 6.4 oz (59.1 kg) IBW/kg (Calculated) : 50.1  Temp (24hrs), Avg:97.7 F (36.5 C), Min:97.4 F (36.3 C), Max:98.1 F (36.7 C)   Recent Labs Lab 05/13/2016 2127 06/10/2016 2204 05/21/16 0305 05/21/16 0515 05/21/16 0620 05/22/16 1004  WBC 6.6  --   --  7.0  --   --   CREATININE 2.59*  --   --  2.98*  --  4.78*  LATICACIDVEN  --  2.42* 5.9*  --  5.4* 12.3*    Estimated Creatinine Clearance: 7.5 mL/min (by C-G formula based on SCr of 4.78 mg/dL (H)).    Allergies  Allergen Reactions  . Cardizem [Diltiazem Hcl] Hives  . Iron     ?? Almost died.  . Rena-Vite [B-Plex] Other (See Comments)    Unknown reaction  . Codeine Other (See Comments)    dizziness  . Pacerone [Amiodarone] Other (See Comments)    Visual changes with generic Amiodarone, the pt can only take brand name Pacerone  . Vancomycin Other (See Comments)    dizziness  . Venofer [Iron Sucrose (Iron Oxide Saccharated)] Itching  . Latex Itching and Rash    Antimicrobials this admission: Cefepime 3/11>3/12 Zosyn 3/12>>  Microbiology results: 3/10 BCx: ngtd 3/11 MRSA PCR: neg  Thank you for allowing pharmacy to be a part of this patient's care.  Sheppard CoilFrank [name] PharmD., BCPS Clinical Pharmacist Pager [phone] 05/22/2016 2:26 PM

## 2016-05-22 NOTE — Progress Notes (Signed)
ANTICOAGULATION CONSULT NOTE   Pharmacy Consult for Coumadin Indication: atrial fibrillation  Allergies  Allergen Reactions  . Cardizem [Diltiazem Hcl] Hives  . Iron     ?? Almost died.  . Rena-Vite [B-Plex] Other (See Comments)    Unknown reaction  . Codeine Other (See Comments)    dizziness  . Pacerone [Amiodarone] Other (See Comments)    Visual changes with generic Amiodarone, the pt can only take brand name Pacerone  . Vancomycin Other (See Comments)    dizziness  . Venofer [Iron Sucrose (Iron Oxide Saccharated)] Itching  . Latex Itching and Rash    Patient Measurements: Height: 5\' 2"  (157.5 cm) Weight: 130 lb 6.4 oz (59.1 kg) IBW/kg (Calculated) : 50.1  Vital Signs: Temp: 97.4 F (36.3 C) (03/12 0423) Temp Source: Axillary (03/12 0423) BP: 109/38 (03/12 0843) Pulse Rate: 58 (03/12 0843)  Labs:  Recent Labs  01-24-2017 2127 05/21/16 0305 05/21/16 0515 05/21/16 1032 05/22/16 0319  HGB 8.9*  --  8.4*  --   --   HCT 31.6*  --  29.9*  --   --   PLT 177  --  194  --   --   APTT  --  55*  --   --   --   LABPROT  --  25.5* 26.4*  --  36.9*  INR  --  2.28 2.38  --  3.61  CREATININE 2.59*  --  2.98*  --   --   TROPONINI  --  0.08* 0.08* 0.10*  --     Estimated Creatinine Clearance: 12.1 mL/min (by C-G formula based on SCr of 2.98 mg/dL (H)).    Assessment: 81yo female c/o productive cough and generalized weakness, admitted for PNA, to continue Coumadin for Afib. Last dose given: 3/11 0500. CBC is low but stable. No signs of bleeding noted.   Current INR has trended up above goal to 3.6. No bleeding noted overnight, no cbc this am.   PTA dose (per coumadin clinic and clarified with husband): 2.5 all days, except 5 on Fridays  DDI: Amiodarone as PTA   Goal of Therapy:  INR 2-3   Plan:  Hold warfarin today to allow INR to drift down Daily INR Monitor for s/sx of bleeding  Sheppard CoilFrank Wilson PharmD., BCPS Clinical Pharmacist Pager [phone] 05/22/2016 10:19  AM

## 2016-05-22 NOTE — Progress Notes (Signed)
   05/22/16 1115  Clinical Encounter Type  Visited With Patient  Visit Type Other (Comment) ([place] consult)  Spiritual Encounters  Spiritual Needs Emotional  Stress Factors  Patient Stress Factors None identified  Introduction to Pt. Who was medicated. Left copy of AD to follow up later.

## 2016-05-22 NOTE — Progress Notes (Signed)
Chaplain Note:  Received request for AD assistance. When I arrived at the room the nurse indicated that patient does not have mental capacity to make her own decisions. I met with patient's husband and discussed his desire for the AD. He seemed most concern about decision making and I assured him that as her husband he was the legal decision maker for her. This seemed to meet his need.  Will be glad to provide additional assistance as needed.  Sue Lush - # [phone]

## 2016-05-23 DIAGNOSIS — R627 Adult failure to thrive: Secondary | ICD-10-CM

## 2016-05-23 DIAGNOSIS — A419 Sepsis, unspecified organism: Secondary | ICD-10-CM | POA: Diagnosis present

## 2016-05-23 DIAGNOSIS — R6521 Severe sepsis with septic shock: Secondary | ICD-10-CM

## 2016-05-23 LAB — COMPREHENSIVE METABOLIC PANEL
ALBUMIN: 2.7 g/dL — AB (ref 3.5–5.0)
ALT: 354 U/L — ABNORMAL HIGH (ref 14–54)
ANION GAP: 16 — AB (ref 5–15)
AST: 822 U/L — ABNORMAL HIGH (ref 15–41)
Alkaline Phosphatase: 113 U/L (ref 38–126)
BILIRUBIN TOTAL: 2.5 mg/dL — AB (ref 0.3–1.2)
BUN: 38 mg/dL — ABNORMAL HIGH (ref 6–20)
CO2: 24 mmol/L (ref 22–32)
Calcium: 9.3 mg/dL (ref 8.9–10.3)
Chloride: 94 mmol/L — ABNORMAL LOW (ref 101–111)
Creatinine, Ser: 5.37 mg/dL — ABNORMAL HIGH (ref 0.44–1.00)
GFR calc non Af Amer: 7 mL/min — ABNORMAL LOW (ref >60)
GFR, EST AFRICAN AMERICAN: 8 mL/min — AB (ref >60)
GLUCOSE: 72 mg/dL (ref 65–99)
POTASSIUM: 5 mmol/L (ref 3.5–5.1)
SODIUM: 134 mmol/L — AB (ref 135–145)
TOTAL PROTEIN: 6.8 g/dL (ref 6.5–8.1)

## 2016-05-23 LAB — CBC
HEMATOCRIT: 33.7 % — AB (ref 36.0–46.0)
Hemoglobin: 9.8 g/dL — ABNORMAL LOW (ref 12.0–15.0)
MCH: 20.3 pg — ABNORMAL LOW (ref 26.0–34.0)
MCHC: 29.1 g/dL — AB (ref 30.0–36.0)
MCV: 69.9 fL — ABNORMAL LOW (ref 78.0–100.0)
Platelets: 183 10*3/uL (ref 150–400)
RBC: 4.82 MIL/uL (ref 3.87–5.11)
RDW: 20.6 % — ABNORMAL HIGH (ref 11.5–15.5)
WBC: 12.7 10*3/uL — ABNORMAL HIGH (ref 4.0–10.5)

## 2016-05-23 LAB — BASIC METABOLIC PANEL
Anion gap: 12 (ref 5–15)
BUN: 8 mg/dL (ref 6–20)
CHLORIDE: 97 mmol/L — AB (ref 101–111)
CO2: 26 mmol/L (ref 22–32)
CREATININE: 2.3 mg/dL — AB (ref 0.44–1.00)
Calcium: 8.1 mg/dL — ABNORMAL LOW (ref 8.9–10.3)
GFR calc Af Amer: 22 mL/min — ABNORMAL LOW (ref >60)
GFR calc non Af Amer: 19 mL/min — ABNORMAL LOW (ref >60)
GLUCOSE: 82 mg/dL (ref 65–99)
Potassium: 3.5 mmol/L (ref 3.5–5.1)
SODIUM: 135 mmol/L (ref 135–145)

## 2016-05-23 LAB — GLUCOSE, CAPILLARY
GLUCOSE-CAPILLARY: 161 mg/dL — AB (ref 65–99)
GLUCOSE-CAPILLARY: 42 mg/dL — AB (ref 65–99)
GLUCOSE-CAPILLARY: 71 mg/dL (ref 65–99)
Glucose-Capillary: 86 mg/dL (ref 65–99)

## 2016-05-23 LAB — PROTIME-INR
INR: 5.71
PROTHROMBIN TIME: 53.2 s — AB (ref 11.4–15.2)

## 2016-05-23 MED ORDER — DEXTROSE 50 % IV SOLN
INTRAVENOUS | Status: AC
  Administered 2016-05-23: 50 mL
  Filled 2016-05-23: qty 50

## 2016-05-23 MED ORDER — DEXTROSE 10 % IV SOLN
INTRAVENOUS | Status: DC
  Administered 2016-05-23: 19:00:00 via INTRAVENOUS

## 2016-05-23 MED ORDER — PIPERACILLIN-TAZOBACTAM 3.375 G IVPB
3.3750 g | Freq: Two times a day (BID) | INTRAVENOUS | Status: DC
  Administered 2016-05-23 – 2016-05-27 (×8): 3.375 g via INTRAVENOUS
  Filled 2016-05-23 (×12): qty 50

## 2016-05-23 MED ORDER — ORAL CARE MOUTH RINSE
15.0000 mL | Freq: Two times a day (BID) | OROMUCOSAL | Status: DC
  Administered 2016-05-23 – 2016-05-27 (×4): 15 mL via OROMUCOSAL

## 2016-05-23 NOTE — Progress Notes (Signed)
Spoke with Dr. Dimple Caseyice about pt cont to have decreased mentation. Not able to take fluids or food safely. NPO. Cont to monitor. Emelda Brothershristy Adalai Perl RN

## 2016-05-23 NOTE — Progress Notes (Signed)
Notified Dr. Dimple Caseyice BS 42. 1 amp dextrose given. New orders to follow. Cont to monitor. Emelda Brothershristy Aleksandra Raben RN

## 2016-05-23 NOTE — Plan of Care (Signed)
Problem: Activity: Goal: Risk for activity intolerance will decrease Outcome: Not Met (add Reason) Pt is not able/willing to follow commands at times.

## 2016-05-23 NOTE — Clinical Social Work Note (Signed)
Clinical Social Worker continuing to follow patient and family for support and discharge planning needs.  CSW provided patient husband with bed offers and patient [facility] is [name]  [name], however request that patient husband communicate with business office regarding patient Medicaid prior to patient admission.  CSW spoke with patient husband and provided him with contact information to reach out to facility to understanding benefits.  CSW remains available for support and to facilitate patient discharge needs once medically stable.  [name], [place] [phone]

## 2016-05-23 NOTE — Progress Notes (Addendum)
Internal Medicine Attending:   I saw and examined the patient. I reviewed the resident's note and I agree with the resident's findings and plan as documented in the resident's note. Lauren SpeakLida Hines was seen on hemodialysis during a.m. rounds, she has no specific complaints, although is not fully oriented. She does follow commands for me. On exam she does have some bibasilar crackles, she has no abdominal tenderness, heart rate appears regular. Overall she looks slightly improved from yesterday  I was concerned yesterday with her increasing lactic acidosis that she was developing shock.  [facility] who discussed prognosis and some goals of care and determined she would not want more aggressive care but currently does want to continue dialysis.  A CT scan of the abdomen and pelvis was obtained to rule out mesenteric ischemia as a cause. This did not identify any areas of concern for mesenteric ischemia area however did suggest bilateral aspiration, overall this was more my concern is a aspiration pneumonia may not have been fully covered for anaerobic bacteria with her initial antibiotics. She is now on Zosyn.  Other workup yesterday afternoon also revealed increasing LFTs as well as INR despite Coumadin being held. I suspect this along with the elevated lactic acid indicated she was having some occult septic shock. Her AST and ALTs appear to be trending down as well as bilirubin today which is a good sign.  Given her failure to thrive at home, and multiple comorbidities nephrology has recommended a palliative care consult which I agree with.

## 2016-05-23 NOTE — NC FL2 (Signed)
[place] MEDICAID FL2 LEVEL OF CARE SCREENING TOOL     IDENTIFICATION  Patient Name: [name] Birthdate: October 20, 1935 Sex: female Admission Date (Current Location): 06/07/2016  [facility] and [place] Number:  Producer, television/film/video and Address:  [facility], [address] [place], [place] [address]      Provider Number: [id number]  Attending Physician Name and Address:  Gust Rung, DO  Relative Name and Phone Number:       Current Level of Care: Hospital Recommended Level of Care: Skilled Nursing Facility Prior Approval Number:    Date Approved/Denied:   PASRR Number: [id number]  Discharge Plan: SNF    Current Diagnoses: Patient Active Problem List   Diagnosis Date Noted  . Thoracic aorta atherosclerosis (HCC) 05/22/2016  . Paroxysmal atrial fibrillation (HCC)   . Lactic acid acidosis   . Pneumonia May 28, 2016  . Neck pain 12/08/2015  . Bloating 10/26/2014  . Seasonal allergies 02/17/2014  . CN (constipation) 02/17/2014  . Postnasal drip 07/16/2013  . Dry eyes 07/16/2013  . Encounter for therapeutic drug monitoring 06/18/2013  . Other postablative hypothyroidism 04/10/2013  . Lower extremity weakness 12/03/2012  . Vitamin B12 deficiency 03/20/2012  . Anticoagulant long-term use   . Atrial flutter (HCC) 04/20/2011  . End stage renal disease on dialysis (HCC) 12/07/2010    Class: Chronic  . Gastritis 12/05/2010  . Chronic diastolic congestive heart failure (HCC) 10/11/2009  . UNSPECIFIED VITAMIN D DEFICIENCY 06/24/2009  . HTN (hypertension) 03/17/2008  . Atrial fibrillation with RVR (HCC) 02/22/2008  . Hyperlipidemia 04/18/2006  . Anemia of chronic disease 04/18/2006  . ANXIETY 04/18/2006  . ADVEF, DRUG/MED/BIOL SUBST, DRUG ALLERGY NEC 04/18/2006    Orientation RESPIRATION BLADDER Height & Weight     Self, Place (Language Barrier)  O2 (2L) Continent Weight: 118 lb 6.2 oz (53.7 kg) Height:  5\' 2"  (157.5 cm)  BEHAVIORAL  SYMPTOMS/MOOD NEUROLOGICAL BOWEL NUTRITION STATUS      Incontinent Diet (Renal Diet - Thin Liquids - Fluid Restriction )  AMBULATORY STATUS COMMUNICATION OF NEEDS Skin   Extensive Assist Verbally (Non English Speaking) Normal                       Personal Care Assistance Level of Assistance  Bathing, Feeding, Dressing Bathing Assistance: Maximum assistance Feeding assistance: Independent Dressing Assistance: Maximum assistance     Functional Limitations Info  Sight, Hearing, Speech Sight Info: Adequate Hearing Info: Adequate Speech Info: Adequate    SPECIAL CARE FACTORS FREQUENCY  PT (By licensed PT), OT (By licensed OT)     PT Frequency: 3X week OT Frequency: 3x week            Contractures Contractures Info: Not present    Additional Factors Info  Code Status, Allergies Code Status Info: DNF Allergies Info: Cardizem Diltiazem Hcl, Iron, Rena-vite B-plex, Codeine, Pacerone Amiodarone, Vancomycin, Venofer Iron Sucrose (Iron Oxide Saccharated), Latex           Current Medications (05/23/2016):  This is the current hospital active medication list Current Facility-Administered Medications  Medication Dose Route Frequency Provider Last Rate Last Dose  . amiodarone (PACERONE) tablet 200 mg  200 mg Oral Daily Michael Litter, MD   200 mg at 05/22/16 1154  . cycloSPORINE (RESTASIS) 0.05 % ophthalmic emulsion 1 drop  1 drop Both Eyes BID Michael Litter, MD   1 drop at 05/22/16 2120  . feeding supplement (PRO-STAT SUGAR FREE 64) liquid 30 mL  30 mL Oral BID Julien NordmannKathryn R Stovall, PA-C   30 mL at 05/22/16 2120  . ipratropium-albuterol (DUONEB) 0.5-2.5 (3) MG/3ML nebulizer solution 3 mL  3 mL Nebulization Q4H PRN Michael LitterNikki Carter, MD      . levothyroxine (SYNTHROID, LEVOTHROID) tablet 137 mcg  137 mcg Oral QAC breakfast Michael LitterNikki Carter, MD   137 mcg at 05/22/16 [phone]  . piperacillin-tazobactam (ZOSYN) IVPB 3.375 g  3.375 g Intravenous Q12H Lauren D Bajbus, RPH      . Warfarin -  Pharmacist Dosing Inpatient   Does not apply q1800 Juliette MangleVeronda P Bryk, [facility] HospitalRPH   Stopped at 05/22/16 1800     Discharge Medications: Please see discharge summary for a list of discharge medications.  Relevant Imaging Results:  Relevant Lab Results:   Additional Information SSN [id number]   Macario GoldsJesse Young Brim, [place] [phone]

## 2016-05-23 NOTE — Progress Notes (Signed)
Subjective: [name] tolerated hemodialysis without major complications today. She continues to be borderline hypotensive and is not very interactive with staff or family. Family expressed concerns about her condition so far I have only spoken with her husband that she is very ill with at this time suspected sepsis with multiple organ system dysfunction and is receiving appropriate antibiotic treatments.  Objective:  Vital signs in last 24 hours: Vitals:   05/23/16 1130 05/23/16 1200 05/23/16 1220 05/23/16 1224  BP: (!) 81/43 (!) 93/37 (!) 90/52 (!) 99/52  Pulse: (!) 105 100 (!) 101 98  Resp:  _0 Temp:    97.6 F (36.4 C)  TempSrc:    Oral  SpO2:    100%  Weight:    115 lb 1.3 oz (52.2 kg)  Height:       GENERAL- frail appearing elderly woman lying in bed eyes closed but tossing fitfully HEENT- moist mucous membranes CARDIAC- RRR, no murmurs or rubs RESP- Bibasilar inspiratory crackles, normal WOB ABDOMEN- Soft, nontender, no guarding or rebound EXTREMITIES- slightly cool bilaterally, trace edema, no pallor SKIN- Dry, no rash or lesion PSYCH- Most non-intelligible speech, difficult to assess mental processes    Assessment/Plan:  Community Acquired Pneumonia: Noted to have a LLL infiltrate on CXR. She was started on Cefepime. Blood cultures pending. Abdominal CT scan incidentally reporting bibasilar infiltrates concerning for aspiration pneumonia given her clinical picture. She is being covered appropriately with zosyn at this time. Overall her picture with tenuous perfusion status over the past 3 days is concerning for an occult sepsis. Blood cultures remain negative at this time. Her very high lactic acidosis of 12 without evidence of focal ischemia suggests poor systemic perfusion. On examination her mental status is still significantly decreased lying in bed and taking but not very intelligibly.  Her failure to thrive and deconditioning prior to this  hospitalization are concerning since her recovery will be difficult even if this acute event becomes controlled. She is not a good candidate for aggressive and invasive therapies if she decompensate further. I think palliative care is an appropriate consultation because she was struggling to maintain outpatient therapies which will only be harder if she recovers from this hospitalization. - Palliative care recommendations may be helpful regarding level of care and future planning - Now on zosyn - f/u blood cx - Duonebs Q4H PRN  Generalized Weakness: There appear to be components of anorexia/failure to thrive and deconditioning now complicated by infection. She has been struggling to attend and complete her hemodialysis sessions - PT/OT evals  Lactic Acidosis: Lactic acid 5.9 on admission and then 5.4 on repeat. Has not been repeated since that time. Will repeat to ensure clearing. Patient does not appear toxic at this time. Lactic acid could have been elevated due to demand ischemia as well. - f/u repeat lactate   Reported head trauma due to fall: Had a fall at home prior to admission. CT head done on admission was negative for any acute intracranial bleed.    HFrEF: TTE 05/2015 w/ LVEF 65-70% and grade 2 diastolic dysfunction. Not in acute exacerbation.  - Hold home Lopressor given low-nml BPs  Atrial fibrillation: CHADS-VAsc score 5. She is on Amiodarone, Lopressor, and anticoagulation with coumadin at home. INR continued to rise today despite holding coumadin, suspect synthetic dysfunction given highly elevated transaminases. - Continue Amiodarone - Holding Lopressor with low-normal blood pressures - Coumadin per pharmacy (held atm) - INR daily   ESRD on HD: On  TThSat schedule. Dr. Dunham is her nephrologist. She last had HD on Sat, 3/10. Hemodialysis today on schedule without emergent indications. Question if her mixed picture of HAGMA plus non-gap alkalosis is from bicarbonate  supplementation - Renal consulted for HD  Hypothyroidism: Continuing home treatment 137mcg  Diet: Renal DVT PPx: Holding coumadin for supertherapeutic INR  Dispo: Patient remains severely ill and planning is pending clinical improvement   Christopher W Rice, MD PGY-II Internal Medicine Resident Pager# [phone] 05/23/2016, 3:53 PM   

## 2016-05-23 NOTE — Progress Notes (Signed)
ANTICOAGULATION CONSULT NOTE   Pharmacy Consult for Coumadin Indication: atrial fibrillation  Allergies  Allergen Reactions  . Cardizem [Diltiazem Hcl] Hives  . Iron     ?? Almost died.  . Rena-Vite [B-Plex] Other (See Comments)    Unknown reaction  . Codeine Other (See Comments)    dizziness  . Pacerone [Amiodarone] Other (See Comments)    Visual changes with generic Amiodarone, the pt can only take brand name Pacerone  . Vancomycin Other (See Comments)    dizziness  . Venofer [Iron Sucrose (Iron Oxide Saccharated)] Itching  . Latex Itching and Rash    Patient Measurements: Height: 5\' 2"  (157.5 cm) Weight: 118 lb 6.2 oz (53.7 kg) IBW/kg (Calculated) : 50.1  Vital Signs: Temp: 97.7 F (36.5 C) (03/13 0709) Temp Source: Oral (03/13 0709) BP: 92/48 (03/13 0851) Pulse Rate: 105 (03/13 0851)  Labs:  Recent Labs  05/21/16 0305 05/21/16 0515 05/21/16 1032 05/22/16 0319 05/22/16 1004 05/22/16 1451 05/23/16 0325  HGB  --  8.4*  --   --   --  8.6* 9.8*  HCT  --  29.9*  --   --   --  30.0* 33.7*  PLT  --  194  --   --   --  205 183  APTT 55*  --   --   --   --   --   --   LABPROT 25.5* 26.4*  --  36.9*  --  49.0* 53.2*  INR 2.28 2.38  --  3.61  --  5.30* 5.71*  CREATININE  --  2.98*  --   --  4.78* 5.37* 5.37*  TROPONINI 0.08* 0.08* 0.10*  --   --   --   --     Estimated Creatinine Clearance: 6.7 mL/min (by C-G formula based on SCr of 5.37 mg/dL (H)).   Assessment: 81yo female c/o productive cough and generalized weakness, admitted for PNA. On warfarin for Afib. INR is elevated at 5.7. Hgb 9.8, plts 183- no bleeding noted.  PTA dose (per Coumadin clinic and clarified with husband): 2.5 all days, except 5 on Fridays.  Continues on Zosyn for PNA. WBC 12.7, afeb.  Goal of Therapy:  INR 2-3   Plan:  Hold warfarin tonight given elevated INR Daily INR and CBC Follow for s/s bleeding  Zosyn 3.375g IV q12h EI for new HD dosing  Sherley Mckenney D. Maydell Knoebel, PharmD,  BCPS Clinical Pharmacist Pager: [phone] 05/23/2016 10:10 AM

## 2016-05-23 NOTE — Progress Notes (Signed)
OT Cancellation Note  Patient Details Name: [name] MRN: [id number] DOB: Nov 04, 1935   Cancelled Treatment:    Reason Eval/Treat Not Completed: Patient at procedure or test/ unavailable.  Will reattempt.  Mostafa Yuan O'Fallononarpe, OTR/L [phone]   Jeani HawkingConarpe, Jianna Drabik M 05/23/2016, 9:42 AM

## 2016-05-23 NOTE — Procedures (Signed)
I was present at this dialysis session. I have reviewed the session itself and made appropriate changes. Goal uf is 3 liters due to crackles on exam.  Filed Weights   05/22/16 0423 05/23/16 0618 05/23/16 0709  Weight: 59.1 kg (130 lb 6.4 oz) 52.2 kg (115 lb) 53.7 kg (118 lb 6.2 oz)     Recent Labs Lab 05/23/16 0325  NA 134*  K 5.0  CL 94*  CO2 24  GLUCOSE 72  BUN 38*  CREATININE 5.37*  CALCIUM 9.3     Recent Labs Lab December 11, 2016 2127 05/21/16 0515 05/22/16 1451 05/23/16 0325  WBC 6.6 7.0 13.2* 12.7*  NEUTROABS 5.2  --  11.9*  --   HGB 8.9* 8.4* 8.6* 9.8*  HCT 31.6* 29.9* 30.0* 33.7*  MCV 71.3* 71.5* 71.4* 69.9*  PLT 177 194 205 183    Scheduled Meds: . amiodarone  200 mg Oral Daily  . cycloSPORINE  1 drop Both Eyes BID  . feeding supplement (PRO-STAT SUGAR FREE 64)  30 mL Oral BID  . levothyroxine  137 mcg Oral QAC breakfast  . piperacillin-tazobactam (ZOSYN)  IV  2.25 g Intravenous Q8H  . Warfarin - Pharmacist Dosing Inpatient   Does not apply q1800   Continuous Infusions: PRN Meds:.ipratropium-albuterol   Irena CordsJoseph A Edie Darley,  MD  Assessment/Plan: 1. LLL infiltrate consistent with pneumonia- on abx and PCCM following 2. Hyperkalemia- on HD  3. ESRd continue with HD qTTS for now, unless family wishes to transition to CMO 4. FTT- pt not eating and losing weight, as well as not wanting to go to HD.  Palliative care should be consulted to help set goal/limits of care. 5. Lactic acidosis- follow after HD 6. supratherapeutic INR- likely due to vitamin K deficiency  7. Protein and caloric malnutrition- per primary svc.  05/23/2016, 8:37 AM

## 2016-05-24 DIAGNOSIS — J690 Pneumonitis due to inhalation of food and vomit: Secondary | ICD-10-CM

## 2016-05-24 DIAGNOSIS — L8990 Pressure ulcer of unspecified site, unspecified stage: Secondary | ICD-10-CM | POA: Insufficient documentation

## 2016-05-24 DIAGNOSIS — Z992 Dependence on renal dialysis: Secondary | ICD-10-CM

## 2016-05-24 DIAGNOSIS — Z7189 Other specified counseling: Secondary | ICD-10-CM

## 2016-05-24 DIAGNOSIS — Z515 Encounter for palliative care: Secondary | ICD-10-CM

## 2016-05-24 DIAGNOSIS — Z7901 Long term (current) use of anticoagulants: Secondary | ICD-10-CM

## 2016-05-24 DIAGNOSIS — I5032 Chronic diastolic (congestive) heart failure: Secondary | ICD-10-CM

## 2016-05-24 DIAGNOSIS — I4891 Unspecified atrial fibrillation: Secondary | ICD-10-CM

## 2016-05-24 DIAGNOSIS — N186 End stage renal disease: Secondary | ICD-10-CM

## 2016-05-24 LAB — COMPREHENSIVE METABOLIC PANEL
ALT: 322 U/L — ABNORMAL HIGH (ref 14–54)
ANION GAP: 12 (ref 5–15)
AST: 604 U/L — ABNORMAL HIGH (ref 15–41)
Albumin: 2.1 g/dL — ABNORMAL LOW (ref 3.5–5.0)
Alkaline Phosphatase: 103 U/L (ref 38–126)
BILIRUBIN TOTAL: 2 mg/dL — AB (ref 0.3–1.2)
BUN: 10 mg/dL (ref 6–20)
CALCIUM: 8.5 mg/dL — AB (ref 8.9–10.3)
CO2: 28 mmol/L (ref 22–32)
CREATININE: 2.72 mg/dL — AB (ref 0.44–1.00)
Chloride: 94 mmol/L — ABNORMAL LOW (ref 101–111)
GFR, EST AFRICAN AMERICAN: 18 mL/min — AB (ref >60)
GFR, EST NON AFRICAN AMERICAN: 16 mL/min — AB (ref >60)
GLUCOSE: 132 mg/dL — AB (ref 65–99)
Potassium: 3.5 mmol/L (ref 3.5–5.1)
Sodium: 134 mmol/L — ABNORMAL LOW (ref 135–145)
TOTAL PROTEIN: 5.8 g/dL — AB (ref 6.5–8.1)

## 2016-05-24 LAB — GLUCOSE, CAPILLARY
GLUCOSE-CAPILLARY: 103 mg/dL — AB (ref 65–99)
GLUCOSE-CAPILLARY: 106 mg/dL — AB (ref 65–99)
GLUCOSE-CAPILLARY: 128 mg/dL — AB (ref 65–99)
GLUCOSE-CAPILLARY: 96 mg/dL (ref 65–99)
Glucose-Capillary: 138 mg/dL — ABNORMAL HIGH (ref 65–99)
Glucose-Capillary: 88 mg/dL (ref 65–99)

## 2016-05-24 LAB — PROCALCITONIN: PROCALCITONIN: 4.99 ng/mL

## 2016-05-24 LAB — CBC
HCT: 30.5 % — ABNORMAL LOW (ref 36.0–46.0)
Hemoglobin: 8.9 g/dL — ABNORMAL LOW (ref 12.0–15.0)
MCH: 20.2 pg — AB (ref 26.0–34.0)
MCHC: 29.2 g/dL — AB (ref 30.0–36.0)
MCV: 69.3 fL — ABNORMAL LOW (ref 78.0–100.0)
PLATELETS: 183 10*3/uL (ref 150–400)
RBC: 4.4 MIL/uL (ref 3.87–5.11)
RDW: 20.6 % — ABNORMAL HIGH (ref 11.5–15.5)
WBC: 11.8 10*3/uL — ABNORMAL HIGH (ref 4.0–10.5)

## 2016-05-24 LAB — PROTIME-INR
INR: 4.95 — AB
PROTHROMBIN TIME: 47.4 s — AB (ref 11.4–15.2)

## 2016-05-24 LAB — AMMONIA: Ammonia: 28 umol/L (ref 9–35)

## 2016-05-24 NOTE — Progress Notes (Signed)
Physical Therapy Treatment Patient Details Name: Lauren SpeakLida Hines MRN: [id number] DOB: [birth year] Today's Date: 05/24/2016    History of Present Illness Pt adm with weakness and found to have PNA. PMH - ESRD on HD, HTN, MI, chf, cad, Graves disease    PT Comments    Co-treatment treatment with OT this session to address functional mobility, upright tolerance, and overall activity tolerance/functional endurance with emphasis on increasing arousal and functional participation. Pt very limited this session, requiring total +2 for functional mobility. Continue to recommend SNF for follow-up services. Discussion in chart regarding palliative care - continue to monitor as PT appropriate.     Follow Up Recommendations  SNF     Equipment Recommendations  None recommended by PT    Recommendations for Other Services       Precautions / Restrictions Precautions Precautions: Fall Restrictions Weight Bearing Restrictions: No    Mobility  Bed Mobility Overal bed mobility: Needs Assistance Bed Mobility: Supine to Sit     Supine to sit: +2 for physical assistance;Max assist     General bed mobility comments: total assist due to decreased arousal. pt inconsistently demonstrating movement in all extremities. once EOB required supervision to total assist for balance  Transfers Overall transfer level: Needs assistance   Transfers: Squat Pivot Transfers;Sit to/from Stand Sit to Stand: +2 physical assistance (difficulty achieving full sit <> stand x 2 reps with +2 assi)   Squat pivot transfers: +2 physical assistance;Total assist     General transfer comment: several attempts for sit <> stands from EOB with inconsistent activation by patient. ultimately required total +2 for pivot into the recliner  Ambulation/Gait                 Stairs            Wheelchair Mobility    Modified Rankin (Stroke Patients Only)       Balance Overall balance assessment: Needs  assistance Sitting-balance support: Bilateral upper extremity supported;Feet supported Sitting balance-Leahy Scale: Zero Sitting balance - Comments: able to maintain static balance with supervision a few seconds, ranging from min to total assist otherwise Postural control: Posterior lean;Other (comment) (significantly impaired righting reactions) Standing balance support: During functional activity;Bilateral upper extremity supported Standing balance-Leahy Scale: Zero                      Cognition Arousal/Alertness: Lethargic Behavior During Therapy: Restless (in bed prior to movement) Overall Cognitive Status: No family/caregiver present to determine baseline cognitive functioning                 General Comments: Pt too lethargic during session to fully assess but per chart presents with impaired cognition and decreased ability to follow commands. Pt very inconsistently followed one step commands    Exercises      General Comments General comments (skin integrity, edema, etc.): focused session on arousal, upright tolerance for to address respiratory function and overall activity tolerance      Pertinent Vitals/Pain Pain Assessment: Faces Faces Pain Scale: No hurt Pain Descriptors / Indicators:  (pt very lethargic)    Home Living                      Prior Function            PT Goals (current goals can now be found in the care plan section) Acute Rehab PT Goals PT Goal Formulation: Patient unable to participate in goal setting Time For  Goal Achievement: 06/05/16 Potential to Achieve Goals: Fair Progress towards PT goals: Progressing toward goals (limited this session due to lethargy)    Frequency    Min 3X/week      PT Plan Current plan remains appropriate    Co-evaluation PT/OT/SLP Co-Evaluation/Treatment: Yes Reason for Co-Treatment: Complexity of the patient's impairments (multi-system involvement);For patient/therapist safety;To  address functional/ADL transfers PT goals addressed during session: Mobility/safety with mobility;Balance;Strengthening/ROM       End of Session Equipment Utilized During Treatment: Oxygen Activity Tolerance: Patient limited by lethargy Patient left: in chair;with call bell/phone within reach;with chair alarm set   PT Visit Diagnosis: Muscle weakness (generalized) (M62.81);Difficulty in walking, not elsewhere classified (R26.2);Adult, failure to thrive (R62.7)     Time: 0912-0942 PT Time Calculation (min) (ACUTE ONLY): 30 min  Charges:  $Therapeutic Activity: 8-22 mins                    G Codes:       Karolee Stamps Darrol Poke, PT, DPT  05/24/2016, 10:00 AM

## 2016-05-24 NOTE — Progress Notes (Signed)
ANTICOAGULATION CONSULT NOTE   Pharmacy Consult for Coumadin Indication: atrial fibrillation  Allergies  Allergen Reactions  . Cardizem [Diltiazem Hcl] Hives  . Iron     ?? Almost died.  . Rena-Vite [B-Plex] Other (See Comments)    Unknown reaction  . Codeine Other (See Comments)    dizziness  . Pacerone [Amiodarone] Other (See Comments)    Visual changes with generic Amiodarone, the pt can only take brand name Pacerone  . Vancomycin Other (See Comments)    dizziness  . Venofer [Iron Sucrose (Iron Oxide Saccharated)] Itching  . Latex Itching and Rash    Patient Measurements: Height: 5\' 2"  (157.5 cm) Weight: 115 lb 1.3 oz (52.2 kg) IBW/kg (Calculated) : 50.1  Vital Signs: Temp: 98.2 F (36.8 C) (03/14 0757) Temp Source: Axillary (03/14 0757) BP: 96/34 (03/14 0757) Pulse Rate: 69 (03/14 0757)  Labs:  Recent Labs  05/22/16 1451 05/23/16 0325 05/23/16 2035 05/24/16 0419  HGB 8.6* 9.8*  --  8.9*  HCT 30.0* 33.7*  --  30.5*  PLT 205 183  --  183  LABPROT 49.0* 53.2*  --  47.4*  INR 5.30* 5.71*  --  4.95*  CREATININE 5.37* 5.37* 2.30* 2.72*    Estimated Creatinine Clearance: 13.3 mL/min (by C-G formula based on SCr of 2.72 mg/dL (H)).   Assessment: 81yo female c/o productive cough and generalized weakness, admitted for PNA. On warfarin for Afib. INR is still elevated at 4.9 but trending down this morning. Hgb down 8.9, plts 183- no bleeding noted.  PTA dose (per Coumadin clinic and clarified with husband): 2.5 all days, except 5 on Fridays.  Continues on Zosyn for PNA. WBC 12.7, afeb.  Goal of Therapy:  INR 2-3   Plan:  Hold warfarin tonight given elevated INR Daily INR and CBC Follow for s/s bleeding Zosyn 3.375g IV q12h EI for new HD dosing  Sheppard CoilFrank Wilson PharmD., BCPS Clinical Pharmacist Pager [phone] 05/24/2016 11:02 AM

## 2016-05-24 NOTE — Evaluation (Signed)
Clinical/Bedside Swallow Evaluation Patient Details  Name: [name] MRN: [id number] Date of Birth: [birth year]  Today's Date: 05/24/2016 Time: SLP Start Time (ACUTE ONLY): 1244 SLP Stop Time (ACUTE ONLY): 1255 SLP Time Calculation (min) (ACUTE ONLY): 11 min  Past Medical History:  Past Medical History:  Diagnosis Date  . Anemia    BL Hgb 8-9. Likely AOCD in setting of ESRD with iron 48, ferritin  746 (03/2010)  . Anticoagulant long-term use   . Anxiety   . Atrial flutter (HCC)   . Cholelithiases    S/P biliary colic cholecystostomy, but not cholecystectomy secondary to omental adhesions  . ESRD (end stage renal disease) (HCC)    HD for 1 year, HD on Tues, Thurs, Sat // Likely secondary to nephrosclerosis from HTN  . HLD (hyperlipidemia)   . HTN (hypertension)   . Hyperthyroidism DX: 07/2010   Likely Grave's Disease. // On diagnosis, TSH < 0.08, free T4 1.94, free T3 5.3 . //  Thyroid US (07/17/2010) - nonspecific diffuse heterogenous thyroid gland. //  Thyrotropin receptor antibody neg (07/17/2010)  . Non-ST elevation MI (NSTEMI) (HCC)    Noninvasive evaluation with Myoview negative for ischemia.  // 2-D echo (05/2010) LVEF 65-70%. Grade 1 diastolic dysfunction.  Peak PA pressure 52.  Marland Kitchen. PAF (paroxysmal atrial fibrillation) (HCC)    On chronic coumadin therapy // Followed by Dr. Excell Seltzerooper (cardiology)   Past Surgical History:  Past Surgical History:  Procedure Laterality Date  . BREAST LUMPECTOMY     right  . GALLBLADDER SURGERY    . OTHER SURGICAL HISTORY  02/2009   Cholecystostomy secondary to biliary colic - NO cholecystectomy secondary to omental adhesions - by Dr. Bertram SavinAmber Allen  . temple artery biopsy  12/14/10   HPI:  81 y/o [place] female admitted with weakness and 2-3 week history of weakness, poor PO intake and occasional cough. Dx pna. PMH - ESRD on HD, HTN, MI, chf, cad, Graves disease.  Decreasing MS, PO intake last few days.  Palliative care has been consulted.     Assessment / Plan / Recommendation Clinical Impression  Pt with decreased MS, not following commands but attempting to open eyes when stimulated.  Per husband, was talking and eating minimally yesterday.  No focal CN deficits.  No anticipation of approaching bolus nor spontaneous effort to drink/consume ice; no spontaneous swallows observed.  Pt not sufficiently alert to eat or drink.  D/W husband, daughters.  Will f/u next date for improvements; if pt awakens, allow POs and watch for s/s of aspiration.  SLP Visit Diagnosis: Dysphagia, unspecified (R13.10)    Aspiration Risk       Diet Recommendation   NPO unless pt awakens; then monitor for s/s of aspiration  Medication Administration: Via alternative means    Other  Recommendations Oral Care Recommendations: Oral care QID   Follow up Recommendations  (tba)      Frequency and Duration min 2x/week  1 week       Prognosis Prognosis for Safe Diet Advancement: Good      Swallow Study   General Date of Onset: 06/06/2016 HPI: 81 y/o [place] female admitted with weakness and 2-3 week history of weakness, poor PO intake and occasional cough. Dx pna. PMH - ESRD on HD, HTN, MI, chf, cad, Graves disease.  Decreasing MS, PO intake last few days.  Palliative care has been consulted.  Type of Study: Bedside Swallow Evaluation Previous Swallow Assessment: no Diet Prior to this Study: NPO (sips and chips) Temperature  Spikes Noted: No Respiratory Status: Nasal cannula History of Recent Intubation: No Behavior/Cognition: Lethargic/Drowsy Oral Cavity Assessment: Within Functional Limits Oral Care Completed by SLP: Recent completion by staff Self-Feeding Abilities: Total assist Patient Positioning: Upright in chair Baseline Vocal Quality: Not observed Volitional Swallow: Unable to elicit    Oral/Motor/Sensory Function Overall Oral Motor/Sensory Function: Within functional limits   Ice Chips Ice chips: Impaired Presentation: Spoon Oral  Phase Impairments: Poor awareness of bolus;Reduced lingual movement/coordination;Reduced labial seal Oral Phase Functional Implications: Oral holding   Thin Liquid Thin Liquid: Not tested    Nectar Thick Nectar Thick Liquid: Not tested   Honey Thick Honey Thick Liquid: Not tested   Puree Puree: Not tested   Solid   GO   Solid: Not tested        [name] [name] 05/24/2016,1:35 PM

## 2016-05-24 NOTE — Progress Notes (Signed)
   05/24/16 1545  Clinical Encounter Type  Visited With Patient and family together  Visit Type Initial  Referral From Patient;Family  Consult/Referral To Chaplain  Recommendations (follow up as needed)  Spiritual Encounters  Spiritual Needs Prayer;Emotional  Stress Factors  Patient Stress Factors Exhausted;Health changes  Pt is in pain, family requesting prayer, end of life care, comfort care, [place] from [place].  Pt. Sister, daughters, and spouse are visiting at bedside.  Chaplain offered prayer, active listening, emotional support, ministry of presence.   Chaplain Mylz Yuan A. Cassy Sprowl, MA-PC , BA-REL/PHIL, [phone]

## 2016-05-24 NOTE — Consult Note (Signed)
WOC Nurse wound consult note Reason for Consult:Satge 2 pressure injury to coccyx/apex of gluteal cleft Wound type:moisture and pressure Pressure Injury POA: No Measurement:0.5 cm x 0.5 cm x 0.1 cm with 0.2 cm erythema present circumferentially.  Wound ZOX:WRUEbed:pink and moist Drainage (amount, consistency, odor) scant serous Periwound:erythema Dressing procedure/placement/frequency:Cleanse wound to coccyx to with NS. Apply silicone border foam dressing.  Change every 3 days and PRN soilage.  Turn and reposition every two hours.  Keep skin clean and dry.  Will not follow at this time.  Please re-consult if needed.  Maple HudsonKaren Kiauna Zywicki RN BSN CWON Pager [phone]

## 2016-05-24 NOTE — Progress Notes (Signed)
PULMONARY / CRITICAL CARE MEDICINE   Name: Mardene SpeakLida Hamblen MRN: [id number] DOB: 25-Sep-1935    ADMISSION DATE:  04-05-2016 CONSULTATION DATE:  May 27, 2016  REFERRING MD:  Dr. Mikey BussingHoffman   CHIEF COMPLAINT:  Weakness, cough/congestion x2 weeks  HISTORY OF PRESENT ILLNESS:   81 y/o [place] female admitted with weakness and 2-3 week history of weakness, poor PO intake and occasional cough.  The patient's husband reports they have been together 53 years.  He states she has "given up" in the last weeks.  She has been stating she doesn't want to go to dialysis any more.  Also, she has not been eating / drinking as usual.    She was initially admitted with concerns for possible LLL PNA and treated with IV antibiotics.  Lactate was initially elevated at 6.  The patient was found on 3/12 to have increased lactic acid and hypoglycemia with associated altered mental status.    SUBJECTIVE: no acute events overnight. LOC worsening overall per nursing notes.   VITAL SIGNS: BP (!) 92/36   Pulse 67   Temp 98 F (36.7 C) (Axillary)   Resp (!) 24   Ht 5\' 2"  (1.575 m)   Wt 52.2 kg (115 lb 1.3 oz)   SpO2 99%   BMI 21.05 kg/m   HEMODYNAMICS:    VENTILATOR SETTINGS:    INTAKE / OUTPUT: I/O last 3 completed shifts: In: 653.3 [I.V.:553.3; IV Piggyback:100] Out: 946 [Other:946]  PHYSICAL EXAMINATION:  General: frail elderly female in NAD resting in bed HEENT: [place]/AT, PERRL, no JVD Neuro: lethargic, does not arouse CV: RRR, no MRG PULM: Coarse bilateral WG:NFAOGI:Soft, non-distended  Extremities: No acute deformity or ROM limitation Skin: Grossly intact   LABS:  BMET  Recent Labs Lab 05/23/16 0325 05/23/16 2035 05/24/16 0419  NA 134* 135 134*  K 5.0 3.5 3.5  CL 94* 97* 94*  CO2 24 26 28   BUN 38* 8 10  CREATININE 5.37* 2.30* 2.72*  GLUCOSE 72 82 132*    Electrolytes  Recent Labs Lab 05/23/16 0325 05/23/16 2035 05/24/16 0419  CALCIUM 9.3 8.1* 8.5*    CBC  Recent Labs Lab  05/22/16 1451 05/23/16 0325 05/24/16 0419  WBC 13.2* 12.7* 11.8*  HGB 8.6* 9.8* 8.9*  HCT 30.0* 33.7* 30.5*  PLT 205 183 183    Coag's  Recent Labs Lab 05/21/16 0305  05/22/16 1451 05/23/16 0325 05/24/16 0419  APTT 55*  --   --   --   --   INR 2.28  < > 5.30* 5.71* 4.95*  < > = values in this interval not displayed.  Sepsis Markers  Recent Labs Lab 05/21/16 0305 05/21/16 0620 05/22/16 1004 05/22/16 1451 05/22/16 1513 05/24/16 0419  LATICACIDVEN 5.9* 5.4* 12.3* 7.5*  --   --   PROCALCITON 1.00  --   --   --  2.13 4.99    ABG No results for input(s): PHART, PCO2ART, PO2ART in the last 168 hours.  Liver Enzymes  Recent Labs Lab 05/22/16 1451 05/23/16 0325 05/24/16 0419  AST 1,044* 822* 604*  ALT 363* 354* 322*  ALKPHOS 106 113 103  BILITOT 2.6* 2.5* 2.0*  ALBUMIN 2.6* 2.7* 2.1*    Cardiac Enzymes  Recent Labs Lab 05/21/16 0305 05/21/16 0515 05/21/16 1032  TROPONINI 0.08* 0.08* 0.10*    Glucose  Recent Labs Lab 05/23/16 0046 05/23/16 1638 05/23/16 1721 05/23/16 2039 05/23/16 2356 05/24/16 0432  GLUCAP 71 42* 161* 86 106* 128*    Imaging No results found.  STUDIES:  CT Head 3/11 >> no acute findings, generalized atrophy/chronic white matter hypodensities  CULTURES: BCx2 3/10 >>   ANTIBIOTICS: Cefepime 3/11 >> 3/12 Levaquin 3/10 x1  Zosyn 3/12 >>   SIGNIFICANT EVENTS: 3/10  Admit with weakness, ? LLL PNA  LINES/TUBES:   DISCUSSION: 81 y/o F admitted with weakness, poor PO intake  ASSESSMENT / PLAN:   Possible LLL Infiltrate / PNA  -Continue abx, D3/x  -Pulmonary hygiene - IS, mobilize as able  -O2 as needed to support sats > 92%  Lactic acidosis: etiology unclear > now down to 7.5 from 12 - lactic clearing slowly  Elevated Troponin - suspected demand + poor clearance with ESRD Hx AF/Flutter on AC, HTN, HLD, MI -Tele monitoring  -Continue coumadin per pharmacy  -Amiodarone   ESRD on HD (T,Th,S)   -HD per  Nephrology  -Trend BMP / urinary output  Severe Protein Calorie Malnutrition - Per primary  ? Ischemic Bowel with elevated lactic acid > no evidence on CT -Diet as tolerated   Transaminitis - Trend LFT - Assess ammonia  Hyperthyroidism/Grave's Disease  - Synthroid per primary   Failure to thrive - agree with palliative care involvement.   FAMILY  - Updates:  Husband updated bedside  - Inter-disciplinary family meet or Palliative Care meeting due by:  Ongoing  PCCM will sign off, please call if needed   Joneen Roach, AGACNP-BC [facility] Pulmonology/Critical Care Pager [phone] or [phone]  05/24/2016 7:55 AM   STAFF NOTE: Cindi Carbon, MD FACP have personally reviewed patient's available data, including medical history, events of note, physical examination and test results as part of my evaluation. I have discussed with resident/NP and other care providers such as pharmacist, RN and RRT. In addition, I personally evaluated patient and elicited key findings of: Not awake, mild distress, lungs coarse , no egophony, unresponsive, CT reviewed, NO T impressed for PNA, does have doudenal edema, I wonder if her LFT and lactic are from bowel ischemia, she is hypotensive, continued empiric abx, this does not look survivable and she does not appear to be able to tolerate HD , comfort care would be best for her, I updated family in the room, would concentrate on her pain and agitation more then anything, will s/o cal if needed, no roell repeat lactic, may need treatment hepatic encpeh  Mcarthur Rossetti. Tyson Alias, MD, FACP Pgr: [phone] [place] [facility] 05/24/2016 2:12 PM

## 2016-05-24 NOTE — Evaluation (Signed)
Occupational Therapy Evaluation Patient Details Name: [name] MRN: [id number] DOB: 04-Jul-1935 Today's Date: 05/24/2016    History of Present Illness Pt adm with weakness and found to have PNA. PMH - ESRD on HD, HTN, MI, chf, cad, Graves disease   Clinical Impression   Unsure of PLOF; pt unable to provide and no family present during session. Pt currently requires total assist +2 for transfers and ADL. Pt very lethargic this session; presenting with poor balance, impaired cognition, generalized weakness impacting her independence and safety with ADL and functional mobility. Recommending SNF for follow up to maximize independence and safety with ADL and functional mobility. Pt would benefit from continued skilled OT to address established goals.    Follow Up Recommendations  SNF;Supervision/Assistance - 24 hour    Equipment Recommendations  Other (comment) (TBD at next venue)    Recommendations for Other Services       Precautions / Restrictions Precautions Precautions: Fall Restrictions Weight Bearing Restrictions: No      Mobility Bed Mobility Overal bed mobility: Needs Assistance Bed Mobility: Supine to Sit     Supine to sit: +2 for physical assistance;Total assist     General bed mobility comments: total assist due to decreased arousal. pt inconsistently demonstrating movement in all extremities. once EOB required supervision to total assist for balance  Transfers Overall transfer level: Needs assistance   Transfers: Squat Pivot Transfers;Sit to/from Stand Sit to Stand: +2 physical assistance (difficulty achieving full sit <> stand x 2 reps with +2 assi)   Squat pivot transfers: +2 physical assistance;Total assist     General transfer comment: several attempts for sit <> stands from EOB with inconsistent activation by patient. ultimately required total +2 for pivot into the recliner    Balance Overall balance assessment: Needs assistance Sitting-balance  support: Bilateral upper extremity supported;Feet supported Sitting balance-Leahy Scale: Zero Sitting balance - Comments: able to maintain static balance with supervision a few seconds, ranging from min to total assist otherwise Postural control: Posterior lean;Other (comment) (significantly impaired righting reactions) Standing balance support: During functional activity;Bilateral upper extremity supported Standing balance-Leahy Scale: Zero                              ADL Overall ADL's : Needs assistance/impaired Eating/Feeding: NPO                       Toilet Transfer: +2 for physical assistance;Squat-pivot;Total assistance Toilet Transfer Details (indicate cue type and reason): Bed > chair         Functional mobility during ADLs: +2 for physical assistance;Total assistance (for squat pivot only) General ADL Comments: Pt currently total assist for ADL.     Vision         Perception     Praxis      Pertinent Vitals/Pain Pain Assessment: Faces Faces Pain Scale: No hurt Pain Descriptors / Indicators:  (pt very lethargic)     Hand Dominance     Extremity/Trunk Assessment Upper Extremity Assessment Upper Extremity Assessment: Generalized weakness (Pt too lethargic to formally assess)   Lower Extremity Assessment Lower Extremity Assessment: Defer to PT evaluation   Cervical / Trunk Assessment Cervical / Trunk Assessment: Kyphotic   Communication Communication Communication: Prefers language other than English (Pt non verbal throughout session)   Cognition Arousal/Alertness: Lethargic Behavior During Therapy: Flat affect;Restless (restless in bed prior to movement) Overall Cognitive Status: No family/caregiver present to determine baseline  cognitive functioning                 General Comments: Pt too lethargic during session to fully assess but per chart presents with impaired cognition and decreased ability to follow commands. Pt very  inconsistently followed one step commands   General Comments  focused session on arousal, upright tolerance for to address respiratory function and overall activity tolerance    Exercises       Shoulder Instructions      Home Living Family/patient expects to be discharged to:: Unsure Living Arrangements: Spouse/significant other Available Help at Discharge: Family;Available 24 hours/day Type of Home: House Home Access: Stairs to enter [employer] of Steps: 1   Home Layout: One level               Home Equipment: Walker - 2 wheels   Additional Comments: Pt unable to provide; information taken from PT eval      Prior Functioning/Environment Level of Independence: Needs assistance  Gait / Transfers Assistance Needed: Amb modified independent with RW for household distances     Comments: Information taken from PT eval; pt unable to provide and no family present        OT Problem List: Decreased strength;Decreased activity tolerance;Impaired balance (sitting and/or standing);Impaired vision/perception;Decreased coordination;Decreased cognition;Decreased safety awareness;Decreased knowledge of use of DME or AE      OT Treatment/Interventions: Self-care/ADL training;Therapeutic exercise;Energy conservation;DME and/or AE instruction;Therapeutic activities;Patient/family education;Balance training    OT Goals(Current goals can be found in the care plan section) Acute Rehab OT Goals Patient Stated Goal: none stated OT Goal Formulation: Patient unable to participate in goal setting Time For Goal Achievement: 06/07/16 Potential to Achieve Goals: Fair ADL Goals Pt Will Perform Grooming: with min assist;sitting Additional ADL Goal #1: Pt will sit EOB x10 minutes with min guard assist as precursor to ADL. Additional ADL Goal #2: Pt will follow one step command 50% of the times in a non distracting environment.  OT Frequency: Min 2X/week   Barriers to D/C:             Co-evaluation PT/OT/SLP Co-Evaluation/Treatment: Yes Reason for Co-Treatment: Complexity of the patient's impairments (multi-system involvement);For patient/therapist safety;To address functional/ADL transfers PT goals addressed during session: Mobility/safety with mobility;Balance;Strengthening/ROM OT goals addressed during session: Other (comment) (functional transfers)      End of Session Equipment Utilized During Treatment: Oxygen Nurse Communication: Mobility status  Activity Tolerance: Patient limited by lethargy Patient left: in chair;with call bell/phone within reach;with chair alarm set  OT Visit Diagnosis: Muscle weakness (generalized) (M62.81);Unsteadiness on feet (R26.81)                ADL either performed or assessed with clinical judgement  Time: 0912-0942 OT Time Calculation (min): 30 min Charges:  OT General Charges $OT Visit: 1 Procedure OT Evaluation $OT Eval Moderate Complexity: 1 Procedure G-Codes:     Fredric Mare A. Brett Albino, M.S., OTR/L Pager: [phone]  Gaye Alken 05/24/2016, 11:00 AM

## 2016-05-24 NOTE — Progress Notes (Signed)
Family requests chaplain for prayer. Consult for Spiritual Care ordered.

## 2016-05-24 NOTE — Progress Notes (Signed)
Subjective: [name] was seen and evaluated today at bedside. PT was in room attempting to work with patient however she was minimally interactive. Minimally responsive to commands.   Objective:  Vital signs in last 24 hours: Vitals:   05/24/16 0445 05/24/16 0540 05/24/16 0741 05/24/16 0757  BP: (!) 85/44 (!) 92/36  (!) 96/34  Pulse: 69 67 70 69  Resp: (!) 27 (!) 24 (!) 24 (!) 29  Temp: 98 F (36.7 C)   98.2 F (36.8 C)  TempSrc: Axillary   Axillary  SpO2: 97% 99% 99% 100%  Weight:      Height:       GENERAL- Lethargic. Frail, chronically-ill appearing elderly woman resting in bedside chair. Minimally responsive to commands. Did briefly open eye when asked however did not move extremities nor answer any questions. There was no speech today.  HEENT- right eye with some crusting.  CARDIAC- RRR, no murmurs or rubs RESP- Bibasilar crackles and breathing was unlabored.  ABDOMEN- Soft, not distended. Did not grimace to palpation.  EXTREMITIES- Cool extremities, no pallor or cyanosis.  PSYCH- Difficult to assess mental processes currently. Appeared comfortable.   Assessment/Plan: Septic shock with multiorgan dysfunction, Community Acquired Pneumonia: Labs continue to trend down however I expect several days until LFTs/INR reaches near-normal. She has been afebrile overnight however pressures remain soft (80's-90's/30's-40's). She remains on coverage for aspiration pneumonia with Zosyn. Given patients lack of significant clinical improvement, will continue this therapy for now. Good discussion was had with the patients husband regarding her wishes. It was determined that she would not want more aggressive care (such as pressors) however does want to continue dialysis at this time. We tend to agree that she would not be an ideal candidate for aggressive and invasive therapies if she were to further decline. Palliative care has been consulted and we greatly appreciate their assistance in  management of this patient and goals of care discussions. Patients husband is aware that the patient is quite sick, however may have unreasonable expectations in terms of her meaningful recovery. She had failure to thrive and deconditioning prior to this hospitalization, making recovery from this major illness difficult.  - Palliative care recommendations greatly appreciated, am happy to provide any further information - Zosyn continued.  - Blood cultures NGTD (day 3) - Duonebs Q4H PRN -Repeat CMET tomorrow AM, pending appropriate for her goals of care which will be established later today.  Generalized Weakness: She has been evaluated by OT however patient was unable to participate due to overall clinical picture. She requires total assist, 2+ for transfers and ADLs. SNF was recommended.   Lactic Acidosis: Lactic acid 5.9 on admission however peaked to 12. Most recently was 7.5 yesterday. Will repeat tomorrow to ensure clearance if aligning with goals of care.   Reported head trauma due to fall: Had a fall at home prior to admission - CT head done on admission was negative for any acute intracranial bleed.    HFrEF: TTE 05/2015 w/ LVEF 65-70% and grade 2 diastolic dysfunction. Not in acute exacerbation.  - Continuing to hold home Lopressor given low-nml BPs  Atrial fibrillation: CHADS-VAsc score 5. She is on Amiodarone, Lopressor, and anticoagulation with coumadin at home. INR improved today at 4.95. No major bleeding. Continue Amiodarone - Holding Lopressor with low-normal blood pressures - Coumadin per pharmacy (held currently) - INR daily   ESRD on HD: On TThSat schedule. [name] is her nephrologist. She last had HD yesterday, 3/13.  -  Renal consulted for HD  Hypothyroidism: Continuing home treatment 137mcg  Diet: NPO in setting of patients decreased mental status. DVT PPx: Holding coumadin for supertherapeutic INR  Dispo: Patient remains severely ill and planning is pending  clinical improvement  Noemi ChapelBethany Alexius Ellington, D.O. PGY-I Internal Medicine Resident Pager# [phone] 05/24/2016, 11:31 AM

## 2016-05-24 NOTE — Consult Note (Signed)
Consultation Note Date: 05/24/2016   Patient Name: [name]  DOB: [birth year]  MRN: [id number]  Age / Sex: 81 y.o., female  PCP: Dellia Nims, MD Referring Physician: Lucious Groves, DO  Reason for Consultation: Establishing goals of care  HPI/Patient Profile: 81 y.o. female  with past medical history of A.fib., ESRD on dialysis, HTN, HLD, prior CVA, CAD, hyperthyroidism, admitted on 05/24/2016 with progressing weakness and SOB. Workup revealed pneumonia and sepsis. [name] was admitted for management and palliative medicine consulted for [place].    Clinical Assessment and Goals of Care: Met with her husband after evaluating patient at bedside. Patient was somnolent, nonverbal and unable to participate in conversation. Mr. Chrisman shares with me the story of his and his wife's refuge from [place] to [place], then immigration to the [place] from [place]. He used to work as a Audiological scientist. He and [name] enjoyed traveling together- [name] especially enjoyed the beach, although [name] would avoid the sun because [name] burned easily.  Mr. Borel shares that patient has been declining for several weeks, decreased po intake, and has been saying [name] has been thinking of stopping dialysis. [name] has been on dialysis for seven year. [name] has had little quality of life. Feeling very poorly on her dialysis days, and on the off days [name] has doctor appointments or is sleeping.  His main goals for her are not to suffer. He says the doctors "are working hard on treating her pneumonia". He notes that if her pneumonia cannot get better then he would want to stop dialysis and focus on her comfort.  He became tearful during our meeting and stated they had been together for fifty years and it is hard to imagine letting her go, but he doesn't want her to suffer. He agreed to meet with me again tomorrow morning.  Primary Decision  Maker NEXT OF KIN - patient's husband- Harveen Flesch    SUMMARY OF RECOMMENDATIONS -Continue current level of care, do not escalate -Will continue [place] discussion tomorrow at 10am with focus on option of transitioning to comfort care    Code Status/Advance Care Planning:  DNR  Prognosis:    Unable to determine  Discharge Planning: To Be Determined  Primary Diagnoses: Present on Admission: . Aspiration pneumonia ([place]) . Atrial fibrillation with RVR ([place]) . HTN (hypertension) . Chronic diastolic congestive heart failure ([place]) . Thoracic aorta atherosclerosis ([place]) . Paroxysmal atrial fibrillation ([facility]) . Lactic acid acidosis . Severe sepsis with septic shock ([place])   I have reviewed the medical record, interviewed the patient and family, and examined the patient. The following aspects are pertinent.  Past Medical History:  Diagnosis Date  . Anemia    BL Hgb 8-9. Likely AOCD in setting of ESRD with iron 48, ferritin  746 (03/2010)  . Anticoagulant long-term use   . Anxiety   . Atrial flutter ([place])   . Cholelithiases    S/P biliary colic cholecystostomy, but not cholecystectomy secondary to omental adhesions  . ESRD (end stage renal disease) ([place])  HD for 1 year, HD on Tues, Thurs, Sat // Likely secondary to nephrosclerosis from HTN  . HLD (hyperlipidemia)   . HTN (hypertension)   . Hyperthyroidism DX: 07/2010   Likely Grave's Disease. // On diagnosis, TSH < 0.08, free T4 1.94, free T3 5.3 . //  Thyroid US (07/17/2010) - nonspecific diffuse heterogenous thyroid gland. //  Thyrotropin receptor antibody neg (07/17/2010)  . Non-ST elevation MI (NSTEMI) ([place])    Noninvasive evaluation with Myoview negative for ischemia.  // 2-D echo (05/2010) LVEF 65-70%. Grade 1 diastolic dysfunction.  Peak PA pressure 52.  Marland Kitchen PAF (paroxysmal atrial fibrillation) ([facility])    On chronic coumadin therapy // Followed by Dr. Burt Knack (cardiology)   Social History   Social History  . Marital  status: Married    Spouse name: N/A  . Number of children: 0  . Years of education: 82 th grad   Occupational History  .  Retired   Social History Main Topics  . Smoking status: Never Smoker  . Smokeless tobacco: Never Used  . Alcohol use No  . Drug use: No  . Sexual activity: Not Asked   Other Topics Concern  . None   Social History Narrative   Lives with spouse in [place].   Housewife.  Originally from [place]   Family History  Problem Relation Age of Onset  . Stroke    . Heart attack    . Diabetes Neg Hx    Scheduled Meds: . amiodarone  200 mg Oral Daily  . cycloSPORINE  1 drop Both Eyes BID  . feeding supplement (PRO-STAT SUGAR FREE 64)  30 mL Oral BID  . levothyroxine  137 mcg Oral QAC breakfast  . mouth rinse  15 mL Mouth Rinse BID  . piperacillin-tazobactam (ZOSYN)  IV  3.375 g Intravenous Q12H  . Warfarin - Pharmacist Dosing Inpatient   Does not apply q1800   Continuous Infusions: PRN Meds:.ipratropium-albuterol Medications Prior to Admission:  Prior to Admission medications   Medication Sig Start Date End Date Taking? Authorizing Provider  amiodarone (PACERONE) 200 MG tablet Take 1 tablet (200 mg total) by mouth daily. 09/06/15  Yes Evans Lance, MD  cycloSPORINE (RESTASIS) 0.05 % ophthalmic emulsion Place 1 drop into both eyes 2 (two) times daily. 07/16/13  Yes Blain Pais, MD  levothyroxine (SYNTHROID) 137 MCG tablet Take 1 tablet (137 mcg total) by mouth daily before breakfast. 05/01/16  Yes Elayne Snare, MD  metoprolol tartrate (LOPRESSOR) 25 MG tablet Take 0.5 tablets (12.5 mg total) by mouth 2 (two) times daily. 12/01/15  Yes Norman Herrlich, MD  warfarin (COUMADIN) 5 MG tablet Take 2.5-5 mg by mouth See admin instructions. Take 1/2 tablet every day except take 1 tablet on Friday per husband   Yes Historical Provider, MD   Allergies  Allergen Reactions  . Cardizem [Diltiazem Hcl] Hives  . Iron     ?? Almost died.  . Rena-Vite [B-Plex] Other  (See Comments)    Unknown reaction  . Codeine Other (See Comments)    dizziness  . Pacerone [Amiodarone] Other (See Comments)    Visual changes with generic Amiodarone, the pt can only take brand name Pacerone  . Vancomycin Other (See Comments)    dizziness  . Venofer [Iron Sucrose (Iron Oxide Saccharated)] Itching  . Latex Itching and Rash   Review of Systems  Unable to perform ROS   Physical Exam  Constitutional: [name] appears well-developed.  HENT:  Head: Normocephalic and atraumatic.  Cardiovascular: Normal rate.   Pulmonary/Chest: Effort normal.  Neurological:  Lethargic, unarouseable  Skin: Skin is warm and dry.    Vital Signs: BP (!) 85/33 (BP Location: Right Arm)   Pulse 69   Temp 98.2 F (36.8 C) (Axillary)   Resp (!) 29   Ht 5' 2"  (1.575 m)   Wt 52.2 kg (115 lb 1.3 oz)   SpO2 100%   BMI 21.05 kg/m  Pain Assessment: Faces   Pain Score: 0-No pain   SpO2: SpO2: 100 % O2 Device:SpO2: 100 % O2 Flow Rate: .O2 Flow Rate (L/min): 2 L/min  IO: Intake/output summary:  Intake/Output Summary (Last 24 hours) at 05/24/16 1409 Last data filed at 05/24/16 1049  Gross per 24 hour  Intake           944.16 ml  Output                0 ml  Net           944.16 ml    LBM: Last BM Date: 05/23/16 Baseline Weight: Weight: 58 kg (127 lb 13.9 oz) Most recent weight: Weight: 52.2 kg (115 lb 1.3 oz)     Palliative Assessment/Data: PPS: 20%     Thank you for this consult. Palliative medicine will continue to follow and assist as needed.   Total time: 80 minutes  Greater than 50%  of this time was spent counseling and coordinating care related to the above assessment and plan.  Signed by: Mariana Kaufman, AGNP-C Palliative Medicine    Please contact Palliative Medicine Team phone at [phone] for questions and concerns.  For individual provider: See Shea Evans

## 2016-05-24 NOTE — Clinical Social Work Note (Signed)
Clinical Social Worker continuing to follow patient and family for support and discharge planning needs.  Per MD, "patient is currently in no condition for discharge as she is unresponsive and totally dependent for care and not appropriate for outpatient dialysis at this time."  CSW remains available for support and to facilitate patient discharge needs once medically stable.  Macario GoldsJesse Jeanell Mangan, Kentucky[place]CS[place] [phone]

## 2016-05-24 NOTE — Progress Notes (Signed)
Patient ID: [name], female   DOB: June 20, 1935, 81 y.o.   MRN: [id number]  [facility] Progress Note    Subjective:   Somnolent and nonverbal but sitting in a chair with her eyes closed.   Objective:   BP (!) 96/34 (BP Location: Right Arm)   Pulse 69   Temp 98.2 F (36.8 C) (Axillary)   Resp (!) 29   Ht 5\' 2"  (1.575 m)   Wt 52.2 kg (115 lb 1.3 oz)   SpO2 100%   BMI 21.05 kg/m   Intake/Output: I/O last 3 completed shifts: In: 653.3 [I.V.:553.3; IV Piggyback:100] Out: 946 [Other:946]   Intake/Output this shift:  No intake/output data recorded. Weight change: 1.536 kg (3 lb 6.2 oz)  Physical Exam: WUJ:WJXBJYN WF, slowed mentation, nonverbal CVS: no rub Resp:scattered crackles and occ rhonchi bilaterally WGN:FAOZHY Ext: LUE AVF +T/B  Labs: BMET  Recent Labs Lab 06/04/16 2127 05/21/16 0515 05/22/16 1004 05/22/16 1451 05/23/16 0325 05/23/16 2035 05/24/16 0419  NA 136 134* 136 133* 134* 135 134*  K 3.4* 4.5 5.5* 4.9 5.0 3.5 3.5  CL 92* 95* 94* 91* 94* 97* 94*  CO2 31 25 18* 23 24 26 28   GLUCOSE 113* 111* 24* 203* 72 82 132*  BUN 10 13 29* 34* 38* 8 10  CREATININE 2.59* 2.98* 4.78* 5.37* 5.37* 2.30* 2.72*  ALBUMIN 2.9*  --   --  2.6* 2.7*  --  2.1*  CALCIUM 8.7* 8.7* 9.9 10.1 9.3 8.1* 8.5*   CBC  Recent Labs Lab 06-04-2016 2127 05/21/16 0515 05/22/16 1451 05/23/16 0325 05/24/16 0419  WBC 6.6 7.0 13.2* 12.7* 11.8*  NEUTROABS 5.2  --  11.9*  --   --   HGB 8.9* 8.4* 8.6* 9.8* 8.9*  HCT 31.6* 29.9* 30.0* 33.7* 30.5*  MCV 71.3* 71.5* 71.4* 69.9* 69.3*  PLT 177 194 205 183 183    @IMGRELPRIORS @ Medications:    . amiodarone  200 mg Oral Daily  . cycloSPORINE  1 drop Both Eyes BID  . feeding supplement (PRO-STAT SUGAR FREE 64)  30 mL Oral BID  . levothyroxine  137 mcg Oral QAC breakfast  . mouth rinse  15 mL Mouth Rinse BID  . piperacillin-tazobactam (ZOSYN)  IV  3.375 g Intravenous Q12H  . Warfarin - Pharmacist Dosing Inpatient    Does not apply q1800   Dialysis Orders:  TTS at [facility] 4 hours, BFR400/DFR800, EDW 52kg, 2K/2Ca, Linear Na, Profile #4 - Heparin 1400 unit bolus q HD - Hectoral IV q HD - Mircera 225 q 2 weeks (just changed from Aranesp weekly, last given 3/8).   Assessment/ Plan:   1.  LLL Pneumonia: Given Cefepime, Azithromycin, Levaquin. Per primary. 2.  ESRD: Will continue TTS schedule.  3.  Hypertension/volume: BP controlled. UF as tolerated with next HD. 4.  Anemia:  not due for ESA yet. 5.  Metabolic bone disease: Corr Ca slightly high, Phos pending. Not getting binder here, will follow labs. Hold on starting VDRA for now. 6.  Nutrition:  Alb 2.9, start Pro-stat supps. 7. A. Fib/flutter: Warfarin per pharmacy. On amiodarone.  INR supratherapeutic per primary svc 8. Hypo and hyperglycemia: Variable glucose readings. No Dx DM and not on meds, follow 9. FTT- palliative Care has been consulted and helping with family and she is currently DNR. 10. Dispo- looking for SNF but she is currently in no condition for discharge as she is unresponsive and totally dependent for care and not appropriate for  outpatient dialysis at this time, unless they are ready for CMO.  Irena CordsJoseph A. Tevita Gomer, MD [facility], [facility] Pager [phone] 05/24/2016, 9:36 AM

## 2016-05-25 LAB — COMPREHENSIVE METABOLIC PANEL
ALBUMIN: 1.9 g/dL — AB (ref 3.5–5.0)
ALK PHOS: 97 U/L (ref 38–126)
ALT: 258 U/L — AB (ref 14–54)
AST: 335 U/L — ABNORMAL HIGH (ref 15–41)
Anion gap: 11 (ref 5–15)
BUN: 22 mg/dL — AB (ref 6–20)
CALCIUM: 8.5 mg/dL — AB (ref 8.9–10.3)
CO2: 28 mmol/L (ref 22–32)
CREATININE: 4.04 mg/dL — AB (ref 0.44–1.00)
Chloride: 95 mmol/L — ABNORMAL LOW (ref 101–111)
GFR calc Af Amer: 11 mL/min — ABNORMAL LOW (ref >60)
GFR calc non Af Amer: 10 mL/min — ABNORMAL LOW (ref >60)
GLUCOSE: 117 mg/dL — AB (ref 65–99)
Potassium: 3.7 mmol/L (ref 3.5–5.1)
SODIUM: 134 mmol/L — AB (ref 135–145)
TOTAL PROTEIN: 5.8 g/dL — AB (ref 6.5–8.1)
Total Bilirubin: 2.1 mg/dL — ABNORMAL HIGH (ref 0.3–1.2)

## 2016-05-25 LAB — GLUCOSE, CAPILLARY
GLUCOSE-CAPILLARY: 89 mg/dL (ref 65–99)
GLUCOSE-CAPILLARY: 80 mg/dL (ref 65–99)
Glucose-Capillary: 109 mg/dL — ABNORMAL HIGH (ref 65–99)
Glucose-Capillary: 64 mg/dL — ABNORMAL LOW (ref 65–99)
Glucose-Capillary: 74 mg/dL (ref 65–99)
Glucose-Capillary: 76 mg/dL (ref 65–99)
Glucose-Capillary: 80 mg/dL (ref 65–99)
Glucose-Capillary: 90 mg/dL (ref 65–99)

## 2016-05-25 LAB — CBC
HEMATOCRIT: 29 % — AB (ref 36.0–46.0)
HEMOGLOBIN: 8.5 g/dL — AB (ref 12.0–15.0)
MCH: 20.2 pg — AB (ref 26.0–34.0)
MCHC: 29.3 g/dL — AB (ref 30.0–36.0)
MCV: 68.9 fL — AB (ref 78.0–100.0)
Platelets: 158 10*3/uL (ref 150–400)
RBC: 4.21 MIL/uL (ref 3.87–5.11)
RDW: 20.5 % — ABNORMAL HIGH (ref 11.5–15.5)
WBC: 10.5 10*3/uL (ref 4.0–10.5)

## 2016-05-25 LAB — LACTIC ACID, PLASMA: Lactic Acid, Venous: 2 mmol/L (ref 0.5–1.9)

## 2016-05-25 LAB — CULTURE, BLOOD (ROUTINE X 2)
CULTURE: NO GROWTH
Culture: NO GROWTH

## 2016-05-25 LAB — PROTIME-INR
INR: 6.03
Prothrombin Time: 55.5 seconds — ABNORMAL HIGH (ref 11.4–15.2)

## 2016-05-25 MED ORDER — DEXTROSE 50 % IV SOLN
INTRAVENOUS | Status: AC
  Administered 2016-05-25: 50 mL
  Filled 2016-05-25: qty 50

## 2016-05-25 MED ORDER — ALBUMIN HUMAN 25 % IV SOLN
INTRAVENOUS | Status: AC
  Filled 2016-05-25: qty 50

## 2016-05-25 MED ORDER — POLYVINYL ALCOHOL 1.4 % OP SOLN
1.0000 [drp] | OPHTHALMIC | Status: DC | PRN
  Filled 2016-05-25 (×2): qty 15

## 2016-05-25 MED ORDER — ALBUMIN HUMAN 25 % IV SOLN
12.5000 g | Freq: Once | INTRAVENOUS | Status: AC
  Administered 2016-05-25: 12.5 g via INTRAVENOUS

## 2016-05-25 MED ORDER — DEXTROSE 50 % IV SOLN
INTRAVENOUS | Status: AC
  Administered 2016-05-25: 25 mL
  Filled 2016-05-25: qty 50

## 2016-05-25 MED ORDER — KETOROLAC TROMETHAMINE 15 MG/ML IJ SOLN: 15.0000 mg | Freq: Two times a day (BID) | INTRAMUSCULAR | Status: DC | PRN

## 2016-05-25 NOTE — Progress Notes (Signed)
Internal Medicine Attending:   I saw and examined the patient.   Patient seen on AM rounds with family at bedside, she continues to be very poorly responsive.  She did no open her eyes for me today, she could only give a slight squeeze on my hand.  Her abomen appears non tender, She is not in any respiratory disuress. Heart rate is regular. Overnight her BP has been boaderline.  Her liver function appears to be improving.  Lactic acid cleared with dialysis.  She remains on IV Zosyn for possible Aspiration PNA.  Critical care is concerned for possible ischemic bowel as her CT did have some bowel edema but was without over evidence of acute bowel ischemia.  Overall we discussed with the family her very poor prognosis, palliative had repeat GOC discussion this afternoon, it appears from their note that the family is not ready for comfort care measures yet.  She remains DNR.

## 2016-05-25 NOTE — Plan of Care (Signed)
Problem: Safety: Goal: Ability to remain free from injury will improve Outcome: Completed/Met Date Met: 05/25/16 Bed alarm remains on. Pt family educated on pt safety in bed and bed alarm

## 2016-05-25 NOTE — Progress Notes (Signed)
CRITICAL VALUE ALERT  Critical value received:  INR 6.03  Date of notification:  05/25/2016   Time of notification:  0430  Critical value read back: yes  Nurse who received alert:  Minna AntisStephanie Gwenlyn Hottinger  MD notified (1st page):  Nelson ChimesAmin  Time of first page: [phone]  MD notified (2nd page):  Time of second page:  Responding MD:  Nelson ChimesAmin  Time MD responded:  [phone]; no new orders received at this time.

## 2016-05-25 NOTE — Progress Notes (Signed)
ANTICOAGULATION CONSULT NOTE   Pharmacy Consult for Coumadin Indication: atrial fibrillation  Allergies  Allergen Reactions  . Cardizem [Diltiazem Hcl] Hives  . Iron     ?? Almost died.  . Rena-Vite [B-Plex] Other (See Comments)    Unknown reaction  . Codeine Other (See Comments)    dizziness  . Pacerone [Amiodarone] Other (See Comments)    Visual changes with generic Amiodarone, the pt can only take brand name Pacerone  . Vancomycin Other (See Comments)    dizziness  . Venofer [Iron Sucrose (Iron Oxide Saccharated)] Itching  . Latex Itching and Rash    Patient Measurements: Height: 5\' 2"  (157.5 cm) Weight: 116 lb 13.5 oz (53 kg) IBW/kg (Calculated) : 50.1  Vital Signs: Temp: 97.7 F (36.5 C) (03/15 0800) Temp Source: Axillary (03/15 0800) BP: 82/37 (03/15 1000) Pulse Rate: 58 (03/15 1000)  Labs:  Recent Labs  05/23/16 0325 05/23/16 2035 05/24/16 0419 05/25/16 0317 05/25/16 0814  HGB 9.8*  --  8.9*  --  8.5*  HCT 33.7*  --  30.5*  --  29.0*  PLT 183  --  183  --  158  LABPROT 53.2*  --  47.4* 55.5*  --   INR 5.71*  --  4.95* 6.03*  --   CREATININE 5.37* 2.30* 2.72* 4.04*  --     Estimated Creatinine Clearance: 8.9 mL/min (A) (by C-G formula based on SCr of 4.04 mg/dL (H)).   Assessment: 81yo female c/o productive cough and generalized weakness, admitted for PNA. On Coumadin 2.5mg  daily exc 5mg  on Fri for Afib. Last Coumadin dose was given on 3/11 and has been on hold since. INR started to trend down yesterday but today back up to 6 without any dose. Hgb 8.5, plts wnl. No s/s of bleed.  Goal of Therapy:  INR 2-3   Plan:  Continue to hold Coumadin tonight Monitor daily INR, CBC, s/s of bleed  Enzo BiNathan Veronica Fretz, PharmD, [facility] Clinical Pharmacist Pager [phone] 05/25/2016 10:16 AM

## 2016-05-25 NOTE — Progress Notes (Signed)
PT Cancellation Note  Patient Details Name: [name] MRN: [id number] DOB: 1935/07/17   Cancelled Treatment:    Reason Eval/Treat Not Completed: Medical issues which prohibited therapy (pt unresponsive this am at HD, low BP since then). Spoke with nursing this afternoon.  Will check on pt tomorrow.  Stephanie AcreKristen M GlenvilleSoth, PT [phone]  [name] 05/25/2016, 3:41 PM

## 2016-05-25 NOTE — Progress Notes (Signed)
Patient's 2000 CBG was 88; midnight CBG was 74. Patient has had no PO intake due to lethargy. Spoke with internal medicine on call resident; received orders to give patient a half ampule of D50. Will reassess CBG at 0400.

## 2016-05-25 NOTE — Procedures (Signed)
I was present at this dialysis session. I have reviewed the session itself and made appropriate changes.  [name] is unresponsive and was hypotensive at outset of HD. With use of SVS her BP has improved to 119/97.  She is not appropriate for outpatient dialysis at this time.  Appreciate Palliative care's input and assistance.    Filed Weights   05/23/16 0709 05/23/16 1224 05/25/16 0453  Weight: 53.7 kg (118 lb 6.2 oz) 52.2 kg (115 lb 1.3 oz) 52.7 kg (116 lb 1.6 oz)     Recent Labs Lab 05/25/16 0317  NA 134*  K 3.7  CL 95*  CO2 28  GLUCOSE 117*  BUN 22*  CREATININE 4.04*  CALCIUM 8.5*     Recent Labs Lab 12/05/16 2127  05/22/16 1451 05/23/16 0325 05/24/16 0419  WBC 6.6  < > 13.2* 12.7* 11.8*  NEUTROABS 5.2  --  11.9*  --   --   HGB 8.9*  < > 8.6* 9.8* 8.9*  HCT 31.6*  < > 30.0* 33.7* 30.5*  MCV 71.3*  < > 71.4* 69.9* 69.3*  PLT 177  < > 205 183 183  < > = values in this interval not displayed.  Scheduled Meds: . amiodarone  200 mg Oral Daily  . cycloSPORINE  1 drop Both Eyes BID  . feeding supplement (PRO-STAT SUGAR FREE 64)  30 mL Oral BID  . levothyroxine  137 mcg Oral QAC breakfast  . mouth rinse  15 mL Mouth Rinse BID  . piperacillin-tazobactam (ZOSYN)  IV  3.375 g Intravenous Q12H  . Warfarin - Pharmacist Dosing Inpatient   Does not apply q1800   Continuous Infusions: PRN Meds:.ipratropium-albuterol   Irena CordsJoseph A Jazmine Longshore,  MD 05/25/2016, 8:22 AM

## 2016-05-25 NOTE — Plan of Care (Signed)
Problem: Safety: Goal: Ability to remain free from injury will improve Outcome: Progressing Patient's bed is in the lowest position and bed alarm is on. Patient has not attempted to get out of bed without assistance. Room is near nurses' station. Patient is a documented high fall risk with decreased LOC.

## 2016-05-25 NOTE — Progress Notes (Signed)
Daily Progress Note   Patient Name: [name]       Date: 05/25/2016 DOB: 1935/08/17  Age: 81 y.o. MRN#: [id number] Attending Physician: Lucious Groves, DO Primary Care Physician: Dellia Nims, MD Admit Date: 06/06/2016  Reason for Consultation/Follow-up: Establishing goals of care  Subjective: Met with patient husband, with Dr. Darci Current present. Discussed patient's status. She was unable to tolerate hemodialysis today. Blood pressure dropped significantly. Discussed that patient will likely not recover functional status, and concerns that patient is in dying process. Husband was encouraged by patient's ability to take in a few bites of food and swallow sips of water. Discussed options of comfort care and stopping dialysis and other interventions. Mr. Radi goals and manner appeared to differ somewhat from his stance yesterday. He requested a few more days of current level of care "to see how she does". Husband voiced that he did not believe she is in pain- I and Dr. Darci Current noted patient's furrowed brow, moaning and agitation at times can be indicators of pain when patients are unable to tell us they are experiencing pain. Review of Systems  Unable to perform ROS: Acuity of condition    Length of Stay: 5  Current Medications: Scheduled Meds:  . amiodarone  200 mg Oral Daily  . cycloSPORINE  1 drop Both Eyes BID  . feeding supplement (PRO-STAT SUGAR FREE 64)  30 mL Oral BID  . levothyroxine  137 mcg Oral QAC breakfast  . mouth rinse  15 mL Mouth Rinse BID  . piperacillin-tazobactam (ZOSYN)  IV  3.375 g Intravenous Q12H  . Warfarin - Pharmacist Dosing Inpatient   Does not apply q1800    Continuous Infusions:   PRN Meds: ipratropium-albuterol  Physical Exam  Cardiovascular:  Normal rate.   Pulmonary/Chest: Effort normal.  cough  Neurological:  Lethargic, does not arouse to my voice or touch            Vital Signs: BP (!) 89/37   Pulse 62   Temp 97.7 F (36.5 C) (Axillary)   Resp 20   Ht 5' 2"  (1.575 m)   Wt 53 kg (116 lb 13.5 oz)   SpO2 100%   BMI 21.37 kg/m  SpO2: SpO2: 100 % O2 Device: O2 Device: Nasal Cannula O2 Flow Rate: O2 Flow Rate (L/min): 2 L/min  Intake/output summary:  Intake/Output Summary (Last 24 hours) at 05/25/16 1444 Last data filed at 05/25/16 1149  Gross per 24 hour  Intake              100 ml  Output              -50 ml  Net              150 ml   LBM: Last BM Date: 05/23/16 Baseline Weight: Weight: 58 kg (127 lb 13.9 oz) Most recent weight: Weight: 53 kg (116 lb 13.5 oz)       Palliative Assessment/Data: PPS: 10%    Flowsheet Rows     Most Recent Value  Intake Tab  Referral Department  -- [internal medicine]  Unit at Time of Referral  Cardiac/Telemetry Unit  Palliative Care Primary Diagnosis  Cardiac  Date Notified  05/23/16  Palliative Care Type  New Palliative care  Reason for referral  Clarify Goals of Care  Date of Admission  06/02/2016  Date first seen by Palliative Care  05/24/16  # of days Palliative referral response time  1 Day(s)  # of days IP prior to Palliative referral  3  Clinical Assessment  Psychosocial & Spiritual Assessment  Palliative Care Outcomes      Patient Active Problem List   Diagnosis Date Noted  . Pressure injury of skin 05/24/2016  . Goals of care, counseling/discussion   . Palliative care by specialist   . Advance care planning   . Severe sepsis with septic shock ([place]) 05/23/2016  . Thoracic aorta atherosclerosis ([place]) 05/22/2016  . Paroxysmal atrial fibrillation ([facility])   . Lactic acid acidosis   . Aspiration pneumonia ([place]) 05/31/2016  . Neck pain 12/08/2015  . Bloating 10/26/2014  . Seasonal allergies 02/17/2014  . CN (constipation) 02/17/2014  . Postnasal drip  07/16/2013  . Dry eyes 07/16/2013  . Encounter for therapeutic drug monitoring 06/18/2013  . Other postablative hypothyroidism 04/10/2013  . Lower extremity weakness 12/03/2012  . Vitamin B12 deficiency 03/20/2012  . Anticoagulant long-term use   . Atrial flutter ([place]) 04/20/2011  . End stage renal disease on dialysis ([place]) 12/07/2010    Class: Chronic  . Gastritis 12/05/2010  . Chronic diastolic congestive heart failure ([place]) 10/11/2009  . UNSPECIFIED VITAMIN D DEFICIENCY 06/24/2009  . HTN (hypertension) 03/17/2008  . Atrial fibrillation with RVR ([place]) 02/22/2008  . Hyperlipidemia 04/18/2006  . Anemia of chronic disease 04/18/2006  . ANXIETY 04/18/2006  . ADVEF, DRUG/MED/BIOL SUBST, DRUG ALLERGY NEC 04/18/2006    Palliative Care Assessment & Plan   Patient Profile: 81 y.o. female  with past medical history of A.fib., ESRD on dialysis, HTN, HLD, prior CVA, CAD, hyperthyroidism, admitted on 05/14/2016 with progressing weakness and SOB. Workup revealed pneumonia and sepsis. She was admitted for management and palliative medicine consulted for [place].    Assessment/Recommendations/Plan   Patient appears to be dying. She was extremely hypotensive during hemodialysis today, and is not recovering her BP's. Her liver function is worsening and albumin today is 1.9.   She had a few moments of increased energy today and was able to eat a few bites, but as I explained to her spouse I believe this was an end of life rally- spouse was not receptive to this. I believe that patient will declare herself over the next few days and when she is actively dying spouse will be more comfortable with transition to comfort measures.  PMT will continue to follow- I am  concerned that patient will decline rapidly.   Code Status:  DNR  Prognosis:   < 2 weeks  Discharge Planning:  To Be Determined  Care plan was discussed with Dr. Danford Bad, and patient's spouse.  Thank you for allowing the Palliative  Medicine Team to assist in the care of this patient.   Total time: 45 minutes  Greater than 50%  of this time was spent counseling and coordinating care related to the above assessment and plan.  Mariana Kaufman, AGNP-C Palliative Medicine   Please contact Palliative Medicine Team phone at [phone] for questions and concerns.

## 2016-05-25 NOTE — Progress Notes (Signed)
  Speech Language Pathology Treatment: Dysphagia  Patient Details Name: [name] SpeakLida Selle MRN: [id number] DOB: 1935-09-26 Today's Date: 05/25/2016 Time: [id number] SLP Time Calculation (min) (ACUTE ONLY): 14 min  Assessment / Plan / Recommendation Clinical Impression  Pt's mental status is waxing/waning today, but she has been able to speak in a limited way with her husband.  She was sufficiently alert to consume several boluses of pudding and sips of water, consumption of which did not elicit s/s of aspiration.  Her husband fed her with care and caution; she expressed thanks to him and he appeared to derive some satisfaction from being able to help her in some concrete way.  Palliative care meeting pending upon leaving the room.  Recommend allowing thin liquids, soft solids per pt's preferences and with careful hand-feeding.  Our services will sign off at this time.     HPI HPI: 81 y/o [place] female admitted with weakness and 2-3 week history of weakness, poor PO intake and occasional cough. Dx pna. PMH - ESRD on HD, HTN, MI, chf, cad, Graves disease.  Decreasing MS, PO intake last few days.  Palliative care has been consulted.       SLP Plan  Discharge SLP treatment due to (comment)       Recommendations  Diet recommendations:  (per preferences - full liquids may be easiest) Liquids provided via: Straw;Cup;Teaspoon Medication Administration: Via alternative means Supervision: Trained caregiver to feed patient Compensations: Slow rate;Small sips/bites Postural Changes and/or Swallow Maneuvers: Seated upright 90 degrees                Oral Care Recommendations: Oral care QID SLP Visit Diagnosis: Dysphagia, unspecified (R13.10) Plan: Discharge SLP treatment due to (comment)       GO                Blenda MountsCouture, Evans Levee Laurice 05/25/2016, 2:23 PM  Tres Grzywacz L. Samson Fredericouture, KentuckyMA CCC/SLP Pager [phone]

## 2016-05-26 LAB — COMPREHENSIVE METABOLIC PANEL WITH GFR
ALT: 175 U/L — ABNORMAL HIGH (ref 14–54)
AST: 151 U/L — ABNORMAL HIGH (ref 15–41)
Albumin: 2.4 g/dL — ABNORMAL LOW (ref 3.5–5.0)
Alkaline Phosphatase: 94 U/L (ref 38–126)
Anion gap: 15 (ref 5–15)
BUN: 14 mg/dL (ref 6–20)
CO2: 28 mmol/L (ref 22–32)
Calcium: 9.1 mg/dL (ref 8.9–10.3)
Chloride: 97 mmol/L — ABNORMAL LOW (ref 101–111)
Creatinine, Ser: 3.02 mg/dL — ABNORMAL HIGH (ref 0.44–1.00)
GFR calc Af Amer: 16 mL/min — ABNORMAL LOW (ref >60)
GFR calc non Af Amer: 14 mL/min — ABNORMAL LOW (ref >60)
Glucose, Bld: 107 mg/dL — ABNORMAL HIGH (ref 65–99)
Potassium: 4.5 mmol/L (ref 3.5–5.1)
Sodium: 140 mmol/L (ref 135–145)
Total Bilirubin: 3.9 mg/dL — ABNORMAL HIGH (ref 0.3–1.2)
Total Protein: 6.4 g/dL — ABNORMAL LOW (ref 6.5–8.1)

## 2016-05-26 LAB — CBC
HCT: 30.4 % — ABNORMAL LOW (ref 36.0–46.0)
HEMATOCRIT: 30.5 % — AB (ref 36.0–46.0)
Hemoglobin: 8.5 g/dL — ABNORMAL LOW (ref 12.0–15.0)
Hemoglobin: 8.6 g/dL — ABNORMAL LOW (ref 12.0–15.0)
MCH: 19.9 pg — ABNORMAL LOW (ref 26.0–34.0)
MCH: 20.2 pg — ABNORMAL LOW (ref 26.0–34.0)
MCHC: 28 g/dL — ABNORMAL LOW (ref 30.0–36.0)
MCHC: 28.2 g/dL — ABNORMAL LOW (ref 30.0–36.0)
MCV: 71.2 fL — ABNORMAL LOW (ref 78.0–100.0)
MCV: 71.8 fL — ABNORMAL LOW (ref 78.0–100.0)
PLATELETS: 166 10*3/uL (ref 150–400)
Platelets: 150 K/uL (ref 150–400)
RBC: 4.27 MIL/uL (ref 3.87–5.11)
RBC: 4.25 MIL/uL (ref 3.87–5.11)
RDW: 21.4 % — ABNORMAL HIGH (ref 11.5–15.5)
RDW: 21.5 % — AB (ref 11.5–15.5)
WBC: 10.6 K/uL — ABNORMAL HIGH (ref 4.0–10.5)
WBC: 11.3 10*3/uL — AB (ref 4.0–10.5)

## 2016-05-26 LAB — RENAL FUNCTION PANEL
Albumin: 2.3 g/dL — ABNORMAL LOW (ref 3.5–5.0)
Anion gap: 13 (ref 5–15)
BUN: 18 mg/dL (ref 6–20)
CO2: 28 mmol/L (ref 22–32)
Calcium: 9.1 mg/dL (ref 8.9–10.3)
Chloride: 99 mmol/L — ABNORMAL LOW (ref 101–111)
Creatinine, Ser: 3.31 mg/dL — ABNORMAL HIGH (ref 0.44–1.00)
GFR calc Af Amer: 14 mL/min — ABNORMAL LOW (ref >60)
GFR calc non Af Amer: 12 mL/min — ABNORMAL LOW (ref >60)
Glucose, Bld: 151 mg/dL — ABNORMAL HIGH (ref 65–99)
Phosphorus: 4.9 mg/dL — ABNORMAL HIGH (ref 2.5–4.6)
Potassium: 4.6 mmol/L (ref 3.5–5.1)
Sodium: 140 mmol/L (ref 135–145)

## 2016-05-26 LAB — PROTIME-INR
INR: 4.95
Prothrombin Time: 47.5 s — ABNORMAL HIGH (ref 11.4–15.2)

## 2016-05-26 LAB — GLUCOSE, CAPILLARY
GLUCOSE-CAPILLARY: 75 mg/dL (ref 65–99)
GLUCOSE-CAPILLARY: 142 mg/dL — AB (ref 65–99)
Glucose-Capillary: 73 mg/dL (ref 65–99)
Glucose-Capillary: 121 mg/dL — ABNORMAL HIGH (ref 65–99)
Glucose-Capillary: 120 mg/dL — ABNORMAL HIGH (ref 65–99)
Glucose-Capillary: 108 mg/dL — ABNORMAL HIGH (ref 65–99)

## 2016-05-26 LAB — PROCALCITONIN: PROCALCITONIN: 4.19 ng/mL

## 2016-05-26 MED ORDER — PENTAFLUOROPROP-TETRAFLUOROETH EX AERO: 1.0000 "application " | INHALATION_SPRAY | CUTANEOUS | Status: DC | PRN

## 2016-05-26 MED ORDER — SODIUM CHLORIDE 0.9 % IV SOLN: 100.0000 mL | INTRAVENOUS | Status: DC | PRN

## 2016-05-26 MED ORDER — LIDOCAINE-PRILOCAINE 2.5-2.5 % EX CREA: 1.0000 "application " | TOPICAL_CREAM | CUTANEOUS | Status: DC | PRN

## 2016-05-26 MED ORDER — LIDOCAINE HCL (PF) 1 % IJ SOLN: 5.0000 mL | INTRAMUSCULAR | Status: DC | PRN

## 2016-05-26 NOTE — Progress Notes (Addendum)
Physical Therapy Treatment Patient Details Name: [name] MRN: [id number] DOB: 1935/07/13 Today's Date: 05/26/2016    History of Present Illness Pt adm with weakness and found to have PNA. PMH - ESRD on HD, HTN, MI, chf, cad, Graves disease    PT Comments    Pt requiring TOT A for rolling for hygiene with little verbalizations and eyes closed throughout the session.  Noted in MD notes, pt with poor prognosis and husband wanting to see how pt does over the weekend.  Will check back on pt next week in case there has been a change in d/c plan and if any DME recs/ family education are needed.  There is a good chance that PT will d/c at that time.  Follow Up Recommendations  SNF     Equipment Recommendations  None recommended by PT    Recommendations for Other Services       Precautions / Restrictions Precautions Precautions: Fall Restrictions Weight Bearing Restrictions: No    Mobility  Bed Mobility Overal bed mobility: Needs Assistance Bed Mobility: Rolling Rolling: Total assist         General bed mobility comments: Pt requiring TOT A with 2 people for cleaning from loose BM.  Pt occasionally verbalizing, but unintellegible  Transfers                    Ambulation/Gait                 Stairs            Wheelchair Mobility    Modified Rankin (Stroke Patients Only)       Balance                                    Cognition Arousal/Alertness: Lethargic Behavior During Therapy: Flat affect Overall Cognitive Status: Difficult to assess                      Exercises      General Comments        Pertinent Vitals/Pain Pain Assessment: Faces Faces Pain Scale: No hurt    Home Living                      Prior Function            PT Goals (current goals can now be found in the care plan section) Acute Rehab PT Goals Patient Stated Goal: none stated PT Goal Formulation: Patient unable to  participate in goal setting Time For Goal Achievement: 06/05/16 Potential to Achieve Goals: Poor Progress towards PT goals: Not progressing toward goals - comment (TOT A)    Frequency    Min 2X/week      PT Plan Current plan remains appropriate;Frequency needs to be updated    Co-evaluation             End of Session Equipment Utilized During Treatment: Oxygen Activity Tolerance: Patient limited by lethargy Patient left: in bed;with call bell/phone within reach;with bed alarm set;with nursing/sitter in room Nurse Communication: Mobility status PT Visit Diagnosis: Muscle weakness (generalized) (M62.81)     Time: 1030-1047 PT Time Calculation (min) (ACUTE ONLY): 17 min  Charges:  $Therapeutic Activity: 8-22 mins                    G Codes:       Lauren Jetter  Hines 05/26/2016, 11:04 AM

## 2016-05-26 NOTE — Progress Notes (Signed)
   Subjective: [name] was seen and evaluated today at bedside. Husband and 2 female family members were present at bedside. She remains minimally responsive and minimally responsive to commands. Overall grim prognosis discussed with family.   Objective:  Vital signs in last 24 hours: Vitals:   05/25/16 1300 05/25/16 1701 05/25/16 2023 05/26/16 0014  BP: (!) 89/37 (!) 102/33    Pulse: 62 65    Resp: 20     Temp: 97.7 F (36.5 C) 98.5 F (36.9 C) 98.4 F (36.9 C) 98.6 F (37 C)  TempSrc: Axillary Oral Oral Axillary  SpO2:  98%    Weight:      Height:       GENERAL- Lethargic. Frail, chronically-ill appearing elderly woman. Minimally responsive to commands. Was able to slightly squeeze hand on command. CARDIAC- RRR, no murmurs or rubs RESP- Bibasilar crackles and breathing was unlabored.  ABDOMEN- Soft, not distended. Did not grimace to palpation. Appears to remain non-tender. EXTREMITIES- Cool extremities, no pallor or cyanosis.  PSYCH- Difficult to assess mental processes currently. Appeared uncomfortable.   Assessment/Plan: Septic shock with multiorgan dysfunction, Community Acquired Pneumonia:  Her labs overall appear to be improving however clinically the patient is not improving as would be expected. She continues to have hepatic dysfunction and INR today increased to 6. I had the opportunity to meet with the patient, her husband (HCPOA) and palliative care this afternoon. Good discussion was had again regarding the patients overall guarded prognosis. Husband is still not yet agreeable to comfort care measures, stating he's not ready to 'give up' yet. She will remain DNR at this time. .  -Continue Zosyn for aspiration PNA -Blood cultures negative -Palliative care on board. I appreciated direct involvement in todays GOC discussion. Will again meet with family tomorrow.   -Repeat CMET tomorrow AM  Generalized Weakness:  She requires total assist, 2+ for transfers and  ADLs. SNF was recommended.   Lactic Acidosis: Lactic acid 5.9 on admission however peaked to 12. Most recently was 7.5.    HFrEF: TTE 05/2015 w/ LVEF 65-70% and grade 2 diastolic dysfunction. Not in acute exacerbation.  - Continuing to hold home Lopressor given low-nml BPs  Atrial fibrillation: CHADS-VAsc score 5. She is on Amiodarone, Lopressor, and anticoagulation with coumadin at home. INR remains supratherapeutic however she is without acute blood loss. Continue Amiodarone - Holding Lopressor with low-normal blood pressures - Coumadin per pharmacy (held currently) - INR daily   ESRD on HD: On TThSat schedule. Dr. Eliott Nineunham is her nephrologist. She last had HD yesterday, 3/13. She did not tolerate HD today, with pressures in the 50's-60's. - Renal consulted for HD  Hypothyroidism: Continuing home treatment 137mcg  Diet: NPO in setting of patients decreased mental status. DVT PPx: Holding coumadin for supertherapeutic INR Code Status: She remains DNR. Will continue working with palliative care.  Dispo: Patient remains severely ill and prognosis is guarded.  [name], D.O. PGY-I Internal Medicine Resident Pager# [phone] 05/25/2016, 10:21 AM

## 2016-05-26 NOTE — Progress Notes (Signed)
Daily Progress Note   Patient Name: [name]       Date: 05/26/2016 DOB: October 07, 1935  Age: 81 y.o. MRN#: [id number] Attending Physician: Randall Hissornelius N Van Dam, MD Primary Care Physician: Hyacinth MeekerAhmed, Tasrif, MD Admit Date: 2017/02/07  Reason for Consultation/Follow-up: Establishing goals of care  Subjective: Patient in bed. Opens eyes briefly and asks for water. Not able to pull through straw. Appreciates sips from sponge. No family at bedside. Noted nephrology note. Will monitor for how patient tolerates HD tomorrow and readdress GOC with spouse on Sunday.  Review of Systems  Unable to perform ROS: Acuity of condition    Length of Stay: 6  Current Medications: Scheduled Meds:  . amiodarone  200 mg Oral Daily  . cycloSPORINE  1 drop Both Eyes BID  . feeding supplement (PRO-STAT SUGAR FREE 64)  30 mL Oral BID  . levothyroxine  137 mcg Oral QAC breakfast  . mouth rinse  15 mL Mouth Rinse BID  . piperacillin-tazobactam (ZOSYN)  IV  3.375 g Intravenous Q12H  . Warfarin - Pharmacist Dosing Inpatient   Does not apply q1800    Continuous Infusions:   PRN Meds: ipratropium-albuterol, ketorolac, polyvinyl alcohol  Physical Exam  Cardiovascular: Normal rate and regular rhythm.   Pulmonary/Chest: She has rales.  Neurological:  Somnolent, minimal verbal response  Skin: Skin is warm and dry.            Vital Signs: BP (!) 87/38 (BP Location: Right Arm)   Pulse 64   Temp 98.5 F (36.9 C) (Axillary)   Resp (!) 24   Ht 5\' 2" (1.575 m)   Wt 53.7 kg (118 lb 6.4 oz)   SpO2 100%   BMI 21.66 kg/m  SpO2: SpO2: 100 % O2 Device: O2 Device: Nasal Cannula O2 Flow Rate: O2 Flow Rate (L/min): 2 L/min  Intake/output summary:  Intake/Output Summary (Last 24 hours) at 05/26/16 1042 Last data  filed at 05/26/16 0900  Gross per 24 hour  Intake              100 ml  Output              -50 ml  Net              15 0 ml   LBM: Last BM Date: 05/26/16 Baseline Weight:  Weight: 58 kg (127 lb 13.9 oz) Most recent weight: Weight: 53.7 kg (118 lb 6.4 oz)       Palliative Assessment/Data: PPS: 10%    Flowsheet Rows     Most Recent Value  Intake Tab  Referral Department  -- [internal medicine]  Unit at Time of Referral  Cardiac/Telemetry Unit  Palliative Care Primary Diagnosis  Cardiac  Date Notified  05/23/16  Palliative Care Type  New Palliative care  Reason for referral  Clarify Goals of Care  Date of Admission  2016-05-23  Date first seen by Palliative Care  05/24/16  # of days Palliative referral response time  1 Day(s)  # of days IP prior to Palliative referral  3  Clinical Assessment  Psychosocial & Spiritual Assessment  Palliative Care Outcomes      Patient Active Problem List   Diagnosis Date Noted  . Pressure injury of skin 05/24/2016  . Goals of care, counseling/discussion   . Palliative care by specialist   . Advance care planning   . Severe sepsis with septic shock ([facility]) 05/23/2016  . Thoracic aorta atherosclerosis ([facility]) 05/22/2016  . Paroxysmal atrial fibrillation ([facility])   . Lactic acid acidosis   . Aspiration pneumonia ([facility]) May 23, 2016  . Neck pain 12/08/2015  . Bloating 10/26/2014  . Seasonal allergies 02/17/2014  . CN (constipation) 02/17/2014  . Postnasal drip 07/16/2013  . Dry eyes 07/16/2013  . Encounter for therapeutic drug monitoring 06/18/2013  . Other postablative hypothyroidism 04/10/2013  . Lower extremity weakness 12/03/2012  . Vitamin B12 deficiency 03/20/2012  . Anticoagulant long-term use   . Atrial flutter ([facility]) 04/20/2011  . End stage renal disease on dialysis ([facility]) 12/07/2010    Class: Chronic  . Gastritis 12/05/2010  . Chronic diastolic congestive heart failure ([facility]) 10/11/2009  . UNSPECIFIED VITAMIN D DEFICIENCY 06/24/2009  .  HTN (hypertension) 03/17/2008  . Atrial fibrillation with RVR ([facility]) 02/22/2008  . Hyperlipidemia 04/18/2006  . Anemia of chronic disease 04/18/2006  . ANXIETY 04/18/2006  . ADVEF, DRUG/MED/BIOL SUBST, DRUG ALLERGY NEC 04/18/2006    Palliative Care Assessment & Plan   Patient Profile: 81 y.o.femalewith past medical history of A.fib., ESRD on dialysis, HTN, HLD, prior CVA, CAD, hyperthyroidism, admitted on 03/13/2018with progressing weakness and SOB. Workup revealed pneumonia and sepsis. She was admitted for management and palliative medicine consulted for GOC. Continues to be lethargic, poor prognosis for meaningful recovery.  Assessment/Recommendations/Plan   Continue current level of care  PMT following and ongoing discussion of GOC with patient's spouse  Code Status:  DNR  Prognosis:   Unable to determine I am concerned she has limited lifetime as she is not tolerating dialysis, and her overall functional and clinical picture is not improving with current interventions. This has been discussed with her spouse by multiple providers. We are supporting him as he considers transition to comfort measures.   Discharge Planning:  To Be Determined    Thank you for allowing the Palliative Medicine Team to assist in the care of this patient.   Total Time: 25 minutes Greater than 50%  of this time was spent counseling and coordinating care related to the above assessment and plan.  Ocie Bob, AGNP-C Palliative Medicine   Please contact Palliative Medicine Team phone at [phone] for questions and concerns.

## 2016-05-26 NOTE — Clinical Social Work Note (Signed)
Clinical Social Worker continuing to follow patient and family for support and discharge planning needs.  Per Palliative MD note, "Will monitor for how patient tolerates HD tomorrow and readdress GOC with spouse on Sunday."  CSW remains available for support and to facilitate patient discharge needs once medically stable.  Macario GoldsJesse Valeen Borys, [place] [phone]

## 2016-05-26 NOTE — Progress Notes (Signed)
Patient ID: [name], female   DOB: Jun 06, 1935, 81 y.o.   MRN: [id number] S:Opens her eyes to voice not following commands and nonverbal O:BP (!) 87/38 (BP Location: Right Arm)   Pulse 64   Temp 98.5 F (36.9 C) (Axillary)   Resp (!) 24   Ht 5\' 2"  (1.575 m)   Wt 53.7 kg (118 lb 6.4 oz)   SpO2 100%   BMI 21.66 kg/m   Intake/Output Summary (Last 24 hours) at 05/26/16 0950 Last data filed at 05/25/16 1700  Gross per 24 hour  Intake                0 ml  Output              -50 ml  Net               50 ml   Intake/Output: I/O last 3 completed shifts: In: 100 [IV Piggyback:100] Out: -50   Intake/Output this shift:  No intake/output data recorded. Weight change: 0.337 kg (11.9 oz) XLK:GMWNUGen:frail cachectic WF somnolent and nonverbal CVS:no rub Resp: occ rhonchi and bilateral crackles UVO:ZDGUYQAbd:benign Ext:LUE AVF +T/B   Recent Labs Lab 05/30/2016 2127  05/22/16 1004 05/22/16 1451 05/23/16 0325 05/23/16 2035 05/24/16 0419 05/25/16 0317 05/26/16 0818  NA 136  < > 136 133* 134* 135 134* 134* 140  K 3.4*  < > 5.5* 4.9 5.0 3.5 3.5 3.7 4.5  CL 92*  < > 94* 91* 94* 97* 94* 95* 97*  CO2 31  < > 18* 23 24 26 28 28 28   GLUCOSE 113*  < > 24* 203* 72 82 132* 117* 107*  BUN 10  < > 29* 34* 38* 8 10 22* 14  CREATININE 2.59*  < > 4.78* 5.37* 5.37* 2.30* 2.72* 4.04* 3.02*  ALBUMIN 2.9*  --   --  2.6* 2.7*  --  2.1* 1.9* 2.4*  CALCIUM 8.7*  < > 9.9 10.1 9.3 8.1* 8.5* 8.5* 9.1  AST 53*  --   --  1,044* 822*  --  604* 335* 151*  ALT 37  --   --  363* 354*  --  322* 258* 175*  < > = values in this interval not displayed. Liver Function Tests:  Recent Labs Lab 05/24/16 0419 05/25/16 0317 05/26/16 0818  AST 604* 335* 151*  ALT 322* 258* 175*  ALKPHOS 103 97 94  BILITOT 2.0* 2.1* 3.9*  PROT 5.8* 5.8* 6.4*  ALBUMIN 2.1* 1.9* 2.4*    Recent Labs Lab 05/22/16 1513  LIPASE 329*    Recent Labs Lab 05/24/16 0839  AMMONIA 28   CBC:  Recent Labs Lab 05/13/2016 2127  05/22/16 1451  05/23/16 0325 05/24/16 0419 05/25/16 0814 05/26/16 0818  WBC 6.6  < > 13.2* 12.7* 11.8* 10.5 10.6*  NEUTROABS 5.2  --  11.9*  --   --   --   --   HGB 8.9*  < > 8.6* 9.8* 8.9* 8.5* 8.5*  HCT 31.6*  < > 30.0* 33.7* 30.5* 29.0* 30.4*  MCV 71.3*  < > 71.4* 69.9* 69.3* 68.9* 71.2*  PLT 177  < > 205 183 183 158 150  < > = values in this interval not displayed. Cardiac Enzymes:  Recent Labs Lab 05/21/16 0305 05/21/16 0515 05/21/16 1032  TROPONINI 0.08* 0.08* 0.10*   CBG:  Recent Labs Lab 05/25/16 1622 05/25/16 2022 05/25/16 2357 05/26/16 0434 05/26/16 0723  GLUCAP 80 76 80 75 73    Iron Studies:  No results for input(s): IRON, TIBC, TRANSFERRIN, FERRITIN in the last 72 hours. Studies/Results: No results found. Marland Kitchen amiodarone  200 mg Oral Daily  . cycloSPORINE  1 drop Both Eyes BID  . feeding supplement (PRO-STAT SUGAR FREE 64)  30 mL Oral BID  . levothyroxine  137 mcg Oral QAC breakfast  . mouth rinse  15 mL Mouth Rinse BID  . piperacillin-tazobactam (ZOSYN)  IV  3.375 g Intravenous Q12H  . Warfarin - Pharmacist Dosing Inpatient   Does not apply q1800    BMET    Component Value Date/Time   NA 140 05/26/2016 0818   K 4.5 05/26/2016 0818   CL 97 (L) 05/26/2016 0818   CO2 28 05/26/2016 0818   GLUCOSE 107 (H) 05/26/2016 0818   BUN 14 05/26/2016 0818   CREATININE 3.02 (H) 05/26/2016 0818   CREATININE 5.62 (H) 08/11/2015 1702   CALCIUM 9.1 05/26/2016 0818   CALCIUM 8.8 09/09/2009 0500   GFRNONAA 14 (L) 05/26/2016 0818   GFRAA 16 (L) 05/26/2016 0818   CBC    Component Value Date/Time   WBC 10.6 (H) 05/26/2016 0818   RBC 4.27 05/26/2016 0818   HGB 8.5 (L) 05/26/2016 0818   HCT 30.4 (L) 05/26/2016 0818   PLT 150 05/26/2016 0818   MCV 71.2 (L) 05/26/2016 0818   MCH 19.9 (L) 05/26/2016 0818   MCHC 28.0 (L) 05/26/2016 0818   RDW 21.4 (H) 05/26/2016 0818   LYMPHSABS 0.5 (L) 05/22/2016 1451   MONOABS 0.8 05/22/2016 1451   EOSABS 0.0 05/22/2016 1451   BASOSABS 0.0  05/22/2016 1451     Dialysis Orders:  TTS at [facility] 4 hours, BFR400/DFR800, EDW 52kg, 2K/2Ca, Linear Na, Profile #4 - Heparin 1400 unit bolus q HD - Hectoral IV q HD - Mircera 225 q 2 weeks (just changed from Aranesp weekly, last given 3/8).   Assessment/ Plan:   1. LLL Pneumonia: Given Cefepime, Azithromycin, Levaquin. Per primary. 2. ESRD: Will continue TTS schedule, although she was very hypotensive on HD despite IV albumin and sodium modeling.  We may be getting to the point where she can no longer tolerate HD.  Will try HD again tomorrow but if her BP continues to drop would recommend transitioning to Comfort measures.  3. Hypertension/volume: BP controlled. UF as tolerated with next HD. 4. Anemia: not due for ESA yet. 5. Metabolic bone disease: Corr Ca slightly high, Phos pending. Not getting binder here, will follow labs. Hold on starting VDRA for now. 6. Nutrition: Alb 2.9, start Pro-stat supps. 7. A. Fib/flutter: Warfarin per pharmacy. On amiodarone.  INR supratherapeutic per primary svc 8. Hypo and hyperglycemia: Variable glucose readings. No Dx DM and not on meds, follow 9. FTT- palliative Care has been consulted and helping with family and she is currently DNR. 10. Dispo- looking for SNF but she is currently in no condition for discharge as she is unresponsive and totally dependent for care and not appropriate for outpatient dialysis at this time, unless they are ready for CMO. 1.   Irena Cords, MD [facility] [phone]

## 2016-05-26 NOTE — Progress Notes (Signed)
ANTICOAGULATION CONSULT NOTE - Follow Up Consult  Pharmacy Consult for Coumadin Indication: atrial fibrillation  Patient Measurements: Height: 5\' 2"  (157.5 cm) Weight: 118 lb 6.4 oz (53.7 kg) IBW/kg (Calculated) : 50.1  Vital Signs: Temp: 97.5 F (36.4 C) (03/16 1100) Temp Source: Axillary (03/16 1100) BP: 100/41 (03/16 1100) Pulse Rate: 63 (03/16 1100)  Labs:  Recent Labs  05/24/16 0419 05/25/16 0317 05/25/16 0814 05/26/16 0444 05/26/16 0818  HGB 8.9*  --  8.5*  --  8.5*  HCT 30.5*  --  29.0*  --  30.4*  PLT 183  --  158  --  150  LABPROT 47.4* 55.5*  --  47.5*  --   INR 4.95* 6.03*  --  4.95*  --   CREATININE 2.72* 4.04*  --   --  3.02*   ESRD  Assessment:  81 yr old female continues on Coumadin as prior to admission for afib.  INR was therapeutic (2.38) on 3/11 and Coumadin 5 mg was given that day. No Coumadin doses given since 3/11, as INR increased to supratherapeutic on 3/12 and remains supratherapeutic (4.95) today. Very little PO intake.  Hgb low stable, platelet count trending down.  Transaminases are elevated but trending down, but total bilirubin up to 3.9 today.    Home Coumadin regimen: 2.5 mg daily except 5 mg on Fridays.    She has also been unable to take other oral meds (Amiodarone and Synthroid) in the last 4 days.  Goal of Therapy:  INR 2-3 Monitor platelets by anticoagulation protocol: Yes   Plan:   No Coumadin again today.  Continue daily PT/INR.  Will follow up plans.    Dennie Fettersgan, Torrey Horseman Donovan, [place] Pager: [phone] 05/26/2016,12:45 PM

## 2016-05-26 NOTE — Progress Notes (Signed)
   Subjective: [name] was seen and evaluated today at bedside. Husband reports hes fed her 2 pudding cups over the last 2 days. He is still hopeful for recovery. Overall grim prognosis discussed with family.   Objective:  Vital signs in last 24 hours: Vitals:   05/26/16 0014 05/26/16 0437 05/26/16 0517 05/26/16 0726  BP:   (!) 105/40 (!) 87/38  Pulse:   70 64  Resp:   (!) 22 (!) 24  Temp: 98.6 F (37 C)  98.2 F (36.8 C) 98.5 F (36.9 C)  TempSrc: Axillary  Axillary Axillary  SpO2:   100% 100%  Weight:  118 lb 6.4 oz (53.7 kg)    Height:       GENERAL- Lethargic. Frail, chronically-ill appearing elderly woman. Minimally responsive to commands.  CARDIAC- RRR, no murmurs or rubs RESP- Bibasilar crackles and breathing was unlabored. Wet cough ABDOMEN- Soft, not distended. Did not grimace to palpation. Appears to remain non-tender. EXTREMITIES- Cool extremities, no pallor or cyanosis.  PSYCH- Difficult to assess mental processes currently. Appeared uncomfortable.   Assessment/Plan: Septic shock with multiorgan dysfunction, Community Acquired Pneumonia:  She continues to have hepatic dysfunction and INR remains elevated. Good discussion was had again regarding the patients overall guarded prognosis. Husband is still not yet agreeable to comfort care measures, stating he's not ready to 'give up' yet. She will remain DNR at this time. She will attempt HD tomorrow however if unable to tolerate again, will likely discontinue further HD sessions. This was discussed with husband who voiced understanding that given her ESRD, she will not survive without HD.  -Continue Zosyn for aspiration PNA -Blood cultures negative -Palliative care on board.  Will again meet with family tomorrow.   -Repeat CMET tomorrow AM  Generalized Weakness:  She requires total assist, 2+ for transfers and ADLs. SNF was recommended.   Lactic Acidosis: Lactic acid 5.9 on admission however peaked to 12. Most  recently was 7.5.    HFrEF: TTE 05/2015 w/ LVEF 65-70% and grade 2 diastolic dysfunction. Not in acute exacerbation.  - Continuing to hold home Lopressor given low-nml BPs  Atrial fibrillation: CHADS-VAsc score 5. She is on Amiodarone, Lopressor, and anticoagulation with coumadin at home. INR remains supratherapeutic however she is without acute blood loss. Continue Amiodarone - Holding Lopressor with low-normal blood pressures - Coumadin per pharmacy (held currently) - INR daily   ESRD on HD: On TThSat schedule. Dr. Eliott Nineunham is her nephrologist. She last had HD yesterday, 3/13. She did not tolerate HD 3/15, with pressures in the 50's-60's despite albumin. Will again attempt HD tomorrow.  - Renal consulted for HD  Hypothyroidism: Continuing home treatment 137mcg  Diet: NPO in setting of patients decreased mental status. Family performs some palliative feeds.  DVT PPx: Holding coumadin for supertherapeutic INR Code Status: She remains DNR. Will continue working with palliative care.  Dispo: Patient remains severely ill and prognosis is guarded.  [name], D.O. PGY-I Internal Medicine Resident Pager# [phone] 05/26/2016, 10:21 AM

## 2016-05-27 DIAGNOSIS — E039 Hypothyroidism, unspecified: Secondary | ICD-10-CM

## 2016-05-27 DIAGNOSIS — Z66 Do not resuscitate: Secondary | ICD-10-CM

## 2016-05-27 DIAGNOSIS — A419 Sepsis, unspecified organism: Principal | ICD-10-CM

## 2016-05-27 DIAGNOSIS — I5022 Chronic systolic (congestive) heart failure: Secondary | ICD-10-CM

## 2016-05-27 DIAGNOSIS — J189 Pneumonia, unspecified organism: Secondary | ICD-10-CM

## 2016-05-27 DIAGNOSIS — R531 Weakness: Secondary | ICD-10-CM

## 2016-05-27 DIAGNOSIS — R6521 Severe sepsis with septic shock: Secondary | ICD-10-CM

## 2016-05-27 DIAGNOSIS — Z79899 Other long term (current) drug therapy: Secondary | ICD-10-CM

## 2016-05-27 LAB — COMPREHENSIVE METABOLIC PANEL
ALT: 149 U/L — ABNORMAL HIGH (ref 14–54)
AST: 103 U/L — ABNORMAL HIGH (ref 15–41)
Albumin: 2.4 g/dL — ABNORMAL LOW (ref 3.5–5.0)
Alkaline Phosphatase: 101 U/L (ref 38–126)
Anion gap: 16 — ABNORMAL HIGH (ref 5–15)
BILIRUBIN TOTAL: 3.2 mg/dL — AB (ref 0.3–1.2)
BUN: 27 mg/dL — AB (ref 6–20)
CO2: 27 mmol/L (ref 22–32)
Calcium: 9.6 mg/dL (ref 8.9–10.3)
Chloride: 97 mmol/L — ABNORMAL LOW (ref 101–111)
Creatinine, Ser: 4.18 mg/dL — ABNORMAL HIGH (ref 0.44–1.00)
GFR calc Af Amer: 11 mL/min — ABNORMAL LOW (ref >60)
GFR calc non Af Amer: 9 mL/min — ABNORMAL LOW (ref >60)
GLUCOSE: 116 mg/dL — AB (ref 65–99)
POTASSIUM: 5.2 mmol/L — AB (ref 3.5–5.1)
Sodium: 140 mmol/L (ref 135–145)
TOTAL PROTEIN: 7.2 g/dL (ref 6.5–8.1)

## 2016-05-27 LAB — GLUCOSE, CAPILLARY
GLUCOSE-CAPILLARY: 35 mg/dL — AB (ref 65–99)
GLUCOSE-CAPILLARY: 99 mg/dL (ref 65–99)
GLUCOSE-CAPILLARY: 88 mg/dL (ref 65–99)
Glucose-Capillary: 134 mg/dL — ABNORMAL HIGH (ref 65–99)
Glucose-Capillary: 111 mg/dL — ABNORMAL HIGH (ref 65–99)
Glucose-Capillary: 92 mg/dL (ref 65–99)

## 2016-05-27 LAB — PROTIME-INR
INR: 5.24 — AB
Prothrombin Time: 49.6 seconds — ABNORMAL HIGH (ref 11.4–15.2)

## 2016-05-27 MED ORDER — ALBUMIN HUMAN 25 % IV SOLN
INTRAVENOUS | Status: AC
  Administered 2016-05-27: 25 g via INTRAVENOUS
  Filled 2016-05-27: qty 100

## 2016-05-27 MED ORDER — ALBUMIN HUMAN 25 % IV SOLN
25.0000 g | Freq: Once | INTRAVENOUS | Status: AC
  Administered 2016-05-27: 25 g via INTRAVENOUS

## 2016-05-27 MED ORDER — DEXTROSE 50 % IV SOLN
INTRAVENOUS | Status: AC
  Administered 2016-05-27: 50 mL
  Filled 2016-05-27: qty 50

## 2016-05-27 NOTE — Procedures (Signed)
   HEMODIALYSIS TREATMENT NOTE:  3.5 hour heparin-free dialysis completed via left upper arm AVF (15g ante/retrograde). Goal NOT met: Unable to tolerate removal of 1 liter as ordered.  Ultrafiltration was interrupted x 20 minutes total due to SBP 70s despite albumin administration.  Net UF 762cc.  All blood was returned and hemostasis was achieved within 20 minutes. Report called to Saddie Benders, RN.  Rockwell Alexandria, RN, CDN

## 2016-05-27 NOTE — Progress Notes (Signed)
   Subjective: [name] was seen and evaluated today at bedside. Husband currently feeding her eggs at bedside. Patient more interactive today.   Objective:  Vital signs in last 24 hours: Vitals:   05/26/16 1956 05/26/16 2328 05/27/16 0403 05/27/16 0753  BP: (!) 95/31 (!) 87/40 (!) 107/37 (!) 102/38  Pulse: 63 64 60 60  Resp: (!) 26 (!) 25 19 (!) 21  Temp: 98.2 F (36.8 C) 97.9 F (36.6 C) 99 F (37.2 C) 98.9 F (37.2 C)  TempSrc: Axillary Axillary Axillary Axillary  SpO2: 100% 100% 100% 100%  Weight:      Height:       GENERAL- Lethargic. Frail, chronically-ill appearing elderly woman. Head of bed elevated, husband at bedside feeding eggs and talking with her. She appears more responsive than yesterday however still quite lethargic.  CARDIAC- RRR, no murmurs or rubs RESP- Bibasilar crackles and breathing was unlabored. ABDOMEN- Soft, not distended. Appears to remain non-tender. EXTREMITIES- Cool extremities, no pallor or cyanosis.   Assessment/Plan: Septic shock with multiorgan dysfunction, Community Acquired Pneumonia:  She continues to have hepatic dysfunction and INR remains elevated. Husband was feeding patient some eggs this morning and he seems encouraged. [name]. Prognosis remains guarded however will continue to treat with IV antibiotics. HD will be attempted again today however if pt unable to tolerate, may d/c further HD sessions. Husband aware pt will not survive without dialysis.  -Continue Zosyn for aspiration PNA, Day 6/7.  -Blood cultures negative -Palliative care on board. Will again meet with family today.   -Repeat CMET tomorrow AM  Generalized Weakness:  She requires total assist, 2+ for transfers and ADLs. SNF was recommended.   Lactic Acidosis: Lactic acid 5.9 on admission however peaked to 12. Most recently was 7.5.    HFrEF: TTE 05/2015 w/ LVEF 65-70% and grade 2 diastolic dysfunction. Not  in acute exacerbation.  - Continuing to hold home Lopressor given low-nml BPs  Atrial fibrillation: CHADS-VAsc score 5. She is on Amiodarone, Lopressor, and anticoagulation with coumadin at home. INR remains supratherapeutic however she is without acute blood loss.  -Continue Amiodarone -Holding Lopressor with low-normal blood pressures -Coumadin per pharmacy (held currently) -INR daily   ESRD on HD: On TThSat schedule. Dr. Eliott Nineunham is her nephrologist. She last had HD yesterday, 3/13. She did not tolerate HD 3/15, with pressures in the 50's-60's despite albumin. Will again attempt HD today. - Renal consulted for HD  Hypothyroidism: Continuing home treatment 137mcg  Diet: Family performs some palliative feeds however will need to be cautious in risk of further aspiration..  DVT PPx: Holding coumadin for supertherapeutic INR Code Status: She remains DNR. Will continue working with palliative care.  Dispo: Patient remains severely ill and prognosis is guarded.  Lauren ChapelBethany Shawntrice Hines, D.O. PGY-I Internal Medicine Resident Pager# [phone] 05/27/2016, 10:21 AM

## 2016-05-27 NOTE — Progress Notes (Signed)
CRITICAL VALUE ALERT  Critical value received:  INR 5.24  Date of notification:  05/27/16  Time of notification:  0815  Critical value read back:Yes.    Nurse who received alert:  Sheppard Evensina Eulis Salazar   MD notified (1st page):  Internal medicine resident  Time of first page:  0830  MD notified (2nd page):   Time of second page:  Responding MD: Susy FrizzleMatt  Time MD responded:  [phone]

## 2016-05-27 NOTE — Progress Notes (Signed)
Patient ID: [name], female   DOB: 1935-04-29, 81 y.o.   MRN: [id number] S: continues to be nonverbal and minimally responsive with auditory upper airway sounds, nurse reports suctioning out eggs from her mouth this morning (was being fed by her husband). O:BP (!) 102/38 (BP Location: Right Arm)   Pulse 60   Temp 98.9 F (37.2 C) (Axillary)   Resp (!) 21   Ht 5\' 2"  (1.575 m)   Wt 53.7 kg (118 lb 6.4 oz)   SpO2 100%   BMI 21.66 kg/m   Intake/Output Summary (Last 24 hours) at 05/27/16 1011 Last data filed at 05/27/16 1000  Gross per 24 hour  Intake               50 ml  Output                0 ml  Net               50 ml   Intake/Output: I/O last 3 completed shifts: In: 150 [IV Piggyback:150] Out: -   Intake/Output this shift:  No intake/output data recorded. Weight change:  Gen: lethargic, cachectic, frail appearing female CVS: difficult to auscultate due to coarse breath sounds Resp: transmitted upper airway sounds, bilateral crackles/rhonchi Abd: +BS, soft Ext: no edema, LUE AVF +T/B   Recent Labs Lab 06-11-2016 2127  05/22/16 1451 05/23/16 0325 05/23/16 2035 05/24/16 0419 05/25/16 0317 05/26/16 0818 05/26/16 1224 05/27/16 0657  NA 136  < > 133* 134* 135 134* 134* 140 140 140  K 3.4*  < > 4.9 5.0 3.5 3.5 3.7 4.5 4.6 5.2*  CL 92*  < > 91* 94* 97* 94* 95* 97* 99* 97*  CO2 31  < > 23 24 26 28 28 28 28 27   GLUCOSE 113*  < > 203* 72 82 132* 117* 107* 151* 116*  BUN 10  < > 34* 38* 8 10 22* 14 18 27*  CREATININE 2.59*  < > 5.37* 5.37* 2.30* 2.72* 4.04* 3.02* 3.31* 4.18*  ALBUMIN 2.9*  --  2.6* 2.7*  --  2.1* 1.9* 2.4* 2.3* 2.4*  CALCIUM 8.7*  < > 10.1 9.3 8.1* 8.5* 8.5* 9.1 9.1 9.6  PHOS  --   --   --   --   --   --   --   --  4.9*  --   AST 53*  --  1,044* 822*  --  604* 335* 151*  --  103*  ALT 37  --  363* 354*  --  322* 258* 175*  --  149*  < > = values in this interval not displayed. Liver Function Tests:  Recent Labs Lab 05/25/16 0317 05/26/16 0818  05/26/16 1224 05/27/16 0657  AST 335* 151*  --  103*  ALT 258* 175*  --  149*  ALKPHOS 97 94  --  101  BILITOT 2.1* 3.9*  --  3.2*  PROT 5.8* 6.4*  --  7.2  ALBUMIN 1.9* 2.4* 2.3* 2.4*    Recent Labs Lab 05/22/16 1513  LIPASE 329*    Recent Labs Lab 05/24/16 0839  AMMONIA 28   CBC:  Recent Labs Lab 2016/06/11 2127  05/22/16 1451 05/23/16 0325 05/24/16 0419 05/25/16 0814 05/26/16 0818 05/26/16 1224  WBC 6.6  < > 13.2* 12.7* 11.8* 10.5 10.6* 11.3*  NEUTROABS 5.2  --  11.9*  --   --   --   --   --   HGB 8.9*  < >  8.6* 9.8* 8.9* 8.5* 8.5* 8.6*  HCT 31.6*  < > 30.0* 33.7* 30.5* 29.0* 30.4* 30.5*  MCV 71.3*  < > 71.4* 69.9* 69.3* 68.9* 71.2* 71.8*  PLT 177  < > 205 183 183 158 150 166  < > = values in this interval not displayed. Cardiac Enzymes:  Recent Labs Lab 05/21/16 0305 05/21/16 0515 05/21/16 1032  TROPONINI 0.08* 0.08* 0.10*   CBG:  Recent Labs Lab 05/26/16 1617 05/26/16 2006 05/26/16 2327 05/27/16 0401 05/27/16 0820  GLUCAP 120* 142* 108* 134* 111*    Iron Studies: No results for input(s): IRON, TIBC, TRANSFERRIN, FERRITIN in the last 72 hours. Studies/Results: No results found. Marland Kitchen. amiodarone  200 mg Oral Daily  . cycloSPORINE  1 drop Both Eyes BID  . feeding supplement (PRO-STAT SUGAR FREE 64)  30 mL Oral BID  . levothyroxine  137 mcg Oral QAC breakfast  . mouth rinse  15 mL Mouth Rinse BID  . piperacillin-tazobactam (ZOSYN)  IV  3.375 g Intravenous Q12H  . Warfarin - Pharmacist Dosing Inpatient   Does not apply q1800    BMET    Component Value Date/Time   NA 140 05/27/2016 0657   K 5.2 (H) 05/27/2016 0657   CL 97 (L) 05/27/2016 0657   CO2 27 05/27/2016 0657   GLUCOSE 116 (H) 05/27/2016 0657   BUN 27 (H) 05/27/2016 0657   CREATININE 4.18 (H) 05/27/2016 0657   CREATININE 5.62 (H) 08/11/2015 1702   CALCIUM 9.6 05/27/2016 0657   CALCIUM 8.8 09/09/2009 0500   GFRNONAA 9 (L) 05/27/2016 0657   GFRAA 11 (L) 05/27/2016 0657   CBC     Component Value Date/Time   WBC 11.3 (H) 05/26/2016 1224   RBC 4.25 05/26/2016 1224   HGB 8.6 (L) 05/26/2016 1224   HCT 30.5 (L) 05/26/2016 1224   PLT 166 05/26/2016 1224   MCV 71.8 (L) 05/26/2016 1224   MCH 20.2 (L) 05/26/2016 1224   MCHC 28.2 (L) 05/26/2016 1224   RDW 21.5 (H) 05/26/2016 1224   LYMPHSABS 0.5 (L) 05/22/2016 1451   MONOABS 0.8 05/22/2016 1451   EOSABS 0.0 05/22/2016 1451   BASOSABS 0.0 05/22/2016 1451    Dialysis Orders:  [facility], BFR400/DFR800, EDW 52kg, 2K/2Ca, Linear Na, Profile #4 - Heparin 1400 unit bolus q HD - Hectoral 3mcg IV q HD - Mircera 225 q 2 weeks (just changed from Aranesp 200mcg weekly, last given 3/8).   Assessment/ Plan:   1. LLL Pneumonia: zosyn. Per primary.  Now concerning for aspiration pneumonia as well. 2. ESRD: Will continue TTS schedule, although she was very hypotensive on HD despite IV albumin and sodium modeling.  We may be getting to the point where she can no longer tolerate HD.  Will try HD again today but if her BP continues to drop, our only course of action is to transition to Comfort measures.  3. Hypertension/volume: remains hypotensive 4. Anemia: not due for ESA yet. 5. Metabolic bone disease: Not getting binder here, will follow labs. Hold on starting VDRA for now. 6. Nutrition: Alb 2.4, start Pro-stat supps. 7. A. Fib/flutter: Warfarin per pharmacy. On amiodarone. INR supratherapeutic per primary svc 8. Hypo and hyperglycemia: Variable glucose readings. No Dx DM and not on meds, follow 9. FTT- palliative Care has been consulted and helping with family and she is currently DNR. 10. Dispo- looking for SNF but she is currently in no condition for discharge as she is unresponsive and totally  dependent for care and not appropriate for outpatient dialysis at this time.  Recommend CMO. 1.   Irena Cords, MD [facility] [phone]

## 2016-05-29 ENCOUNTER — Encounter: Payer: Medicaid Other | Admitting: Internal Medicine

## 2016-05-30 ENCOUNTER — Encounter: Payer: Self-pay | Admitting: Internal Medicine

## 2016-06-05 ENCOUNTER — Encounter: Payer: Medicaid Other | Admitting: Internal Medicine

## 2016-06-11 NOTE — Progress Notes (Addendum)
Internal Medicine Night Float Interim Progress Note  Called by nursing at 0014 indicating that our patient [name] had passed away at 0006. Per nursing, patient became more somnolent, tachypneic in 40s around 2315. Later at 2345, central telemetry noted nursing that HR was sustaining in the 20s. Patient was noted to be bradycardic, tachypneic, and hypotensive. She later passed at 0006. Patient's husband had been at bedside earlier in the evening but left prior to clinical deterioration. Patient's passing was not unexpected as she was very ill; however, of note, IMTS was not notified of the change in clinical status until the patient's passing at 0014. We called the husband to let him know of his wife's passing and he expressed understanding. He will return in the morning to make arrangements.   Karlene LinemanAlexa Burns, DO PGY-3 Internal Medicine Resident Pager # [phone] 06/10/2016 12:43 AM

## 2016-06-11 NOTE — Discharge Summary (Signed)
  Name: [name] MRN: [id number] DOB: 06-Oct-1935 81 y.o.  Date of Admission: 05/24/2016  8:40 PM Date of Discharge: 10-17-2016 Attending Physician: Dr. Daiva EvesVan Dam  Discharge Diagnosis: Principal Problem:   Aspiration pneumonia [facility](HCC) Active Problems:   HTN (hypertension)   Atrial fibrillation with RVR (HCC)   Chronic diastolic congestive heart failure (HCC)   End stage renal disease on dialysis [facility](HCC)   Anticoagulant long-term use   Thoracic aorta atherosclerosis (HCC)   Paroxysmal atrial fibrillation (HCC)   Lactic acid acidosis   Severe sepsis with septic shock (HCC)   Pressure injury of skin   Goals of care, counseling/discussion   Palliative care by specialist   Advance care planning   Cause of death: Respiratory failure and multiorgan dysfunction from sepsis Time of death: 0006 08/16/2016  Disposition and follow-up:   [name] [facility]    Hospital Course: Ms. Tommy Hines [facility]10 for generalized weakness and coughing. This was determined to be most likely pneumonia based on respiratory symptoms and infiltrates on chest radiography. She was transferred to the Internal Medicine service on 3/12 where she was noted to have evidence of multiple organ systems dysfunction particularly new liver failure with lactic acidosis. She was evaluated by the critical care service who felt an increased level of care would not offer long term benefit to her. Repeat imaging suggested a pattern of aspiration pneumonia and antibiotics were slightly adjusted to cover this optimally. Over the next few days, several laboratory abnormalities improved but she clincially deteriorated. This was observed with a varying level of alertness and increasing difficulty tolerating hemodialysis due to severe hypotension and lethargy. Overnight on Saturday 3/17 she developed some worsening respiratory distress which  quickly progressed to bradycardia likely from hypoxia and diet at 0006.  Signed: Fuller Planhristopher W Garnetta Fedrick, MD PGY-II Internal Medicine Resident 05/29/2016, 1:26 PM

## 2016-06-11 DEATH — deceased

## 2016-07-28 ENCOUNTER — Other Ambulatory Visit: Payer: Medicaid Other

## 2016-08-01 ENCOUNTER — Ambulatory Visit: Payer: Medicaid Other | Admitting: Endocrinology
